# Patient Record
Sex: Male | Born: 1965
Health system: Southern US, Community
[De-identification: ages and names within clinical notes are randomized; demographics above are authoritative.]

## PROBLEM LIST (undated history)

## (undated) DIAGNOSIS — I639 Cerebral infarction, unspecified: Secondary | ICD-10-CM

## (undated) DIAGNOSIS — R7303 Prediabetes: Secondary | ICD-10-CM

## (undated) DIAGNOSIS — G4730 Sleep apnea, unspecified: Secondary | ICD-10-CM

## (undated) DIAGNOSIS — R7611 Nonspecific reaction to tuberculin skin test without active tuberculosis: Secondary | ICD-10-CM

## (undated) DIAGNOSIS — Z972 Presence of dental prosthetic device (complete) (partial): Secondary | ICD-10-CM

## (undated) DIAGNOSIS — I10 Essential (primary) hypertension: Secondary | ICD-10-CM

## (undated) DIAGNOSIS — Z8739 Personal history of other diseases of the musculoskeletal system and connective tissue: Secondary | ICD-10-CM

## (undated) DIAGNOSIS — I6529 Occlusion and stenosis of unspecified carotid artery: Secondary | ICD-10-CM

## (undated) DIAGNOSIS — K219 Gastro-esophageal reflux disease without esophagitis: Secondary | ICD-10-CM

## (undated) DIAGNOSIS — I471 Supraventricular tachycardia, unspecified: Secondary | ICD-10-CM

## (undated) DIAGNOSIS — M549 Dorsalgia, unspecified: Secondary | ICD-10-CM

## (undated) DIAGNOSIS — E785 Hyperlipidemia, unspecified: Secondary | ICD-10-CM

## (undated) DIAGNOSIS — I700 Atherosclerosis of aorta: Secondary | ICD-10-CM

## (undated) DIAGNOSIS — G8929 Other chronic pain: Secondary | ICD-10-CM

## (undated) DIAGNOSIS — M1611 Unilateral primary osteoarthritis, right hip: Secondary | ICD-10-CM

## (undated) HISTORY — PX: NECK SURGERY: SHX720

## (undated) HISTORY — DX: Supraventricular tachycardia, unspecified: I47.10

## (undated) HISTORY — DX: Atherosclerosis of aorta: I70.0

## (undated) HISTORY — PX: MULTIPLE TOOTH EXTRACTIONS: SHX2053

## (undated) HISTORY — PX: BACK SURGERY: SHX140

## (undated) HISTORY — DX: Nonspecific reaction to tuberculin skin test without active tuberculosis: R76.11

## (undated) HISTORY — DX: Personal history of other diseases of the musculoskeletal system and connective tissue: Z87.39

## (undated) HISTORY — DX: Other chronic pain: G89.29

## (undated) HISTORY — DX: Unilateral primary osteoarthritis, right hip: M16.11

## (undated) HISTORY — DX: Dorsalgia, unspecified: M54.9

---

## 1999-12-05 ENCOUNTER — Emergency Department (HOSPITAL_COMMUNITY): Admission: EM | Admit: 1999-12-05 | Discharge: 1999-12-06 | Payer: Self-pay | Admitting: Emergency Medicine

## 2004-06-30 ENCOUNTER — Emergency Department (HOSPITAL_COMMUNITY): Admission: EM | Admit: 2004-06-30 | Discharge: 2004-06-30 | Payer: Self-pay | Admitting: Emergency Medicine

## 2004-09-21 ENCOUNTER — Encounter: Admission: RE | Admit: 2004-09-21 | Discharge: 2004-09-21 | Payer: Self-pay | Admitting: Family Medicine

## 2004-10-06 ENCOUNTER — Ambulatory Visit (HOSPITAL_COMMUNITY): Admission: RE | Admit: 2004-10-06 | Discharge: 2004-10-07 | Payer: Self-pay | Admitting: Neurosurgery

## 2005-01-02 ENCOUNTER — Ambulatory Visit: Payer: Self-pay | Admitting: Family Medicine

## 2005-02-10 ENCOUNTER — Ambulatory Visit: Payer: Self-pay | Admitting: Internal Medicine

## 2005-02-10 ENCOUNTER — Observation Stay (HOSPITAL_COMMUNITY): Admission: EM | Admit: 2005-02-10 | Discharge: 2005-02-11 | Payer: Self-pay | Admitting: *Deleted

## 2005-02-11 ENCOUNTER — Ambulatory Visit: Payer: Self-pay | Admitting: Internal Medicine

## 2005-06-09 ENCOUNTER — Emergency Department (HOSPITAL_COMMUNITY): Admission: EM | Admit: 2005-06-09 | Discharge: 2005-06-09 | Payer: Self-pay | Admitting: Emergency Medicine

## 2005-07-13 ENCOUNTER — Ambulatory Visit: Payer: Self-pay | Admitting: Family Medicine

## 2005-11-14 ENCOUNTER — Ambulatory Visit: Payer: Self-pay | Admitting: Family Medicine

## 2006-03-21 ENCOUNTER — Ambulatory Visit: Payer: Self-pay | Admitting: Internal Medicine

## 2006-03-26 ENCOUNTER — Emergency Department (HOSPITAL_COMMUNITY): Admission: EM | Admit: 2006-03-26 | Discharge: 2006-03-27 | Payer: Self-pay | Admitting: Emergency Medicine

## 2006-03-29 ENCOUNTER — Ambulatory Visit: Payer: Self-pay | Admitting: Family Medicine

## 2006-04-19 ENCOUNTER — Ambulatory Visit: Payer: Self-pay | Admitting: Family Medicine

## 2006-06-19 ENCOUNTER — Emergency Department (HOSPITAL_COMMUNITY): Admission: EM | Admit: 2006-06-19 | Discharge: 2006-06-19 | Payer: Self-pay | Admitting: Family Medicine

## 2006-10-31 ENCOUNTER — Ambulatory Visit: Payer: Self-pay | Admitting: Family Medicine

## 2006-12-11 ENCOUNTER — Emergency Department (HOSPITAL_COMMUNITY): Admission: EM | Admit: 2006-12-11 | Discharge: 2006-12-12 | Payer: Self-pay | Admitting: Emergency Medicine

## 2007-05-31 DIAGNOSIS — M542 Cervicalgia: Secondary | ICD-10-CM | POA: Insufficient documentation

## 2007-06-27 DIAGNOSIS — K219 Gastro-esophageal reflux disease without esophagitis: Secondary | ICD-10-CM | POA: Insufficient documentation

## 2007-06-27 DIAGNOSIS — I10 Essential (primary) hypertension: Secondary | ICD-10-CM | POA: Insufficient documentation

## 2007-07-20 ENCOUNTER — Emergency Department (HOSPITAL_COMMUNITY): Admission: EM | Admit: 2007-07-20 | Discharge: 2007-07-20 | Payer: Self-pay | Admitting: Emergency Medicine

## 2007-07-31 ENCOUNTER — Ambulatory Visit: Payer: Self-pay | Admitting: Family Medicine

## 2007-08-22 ENCOUNTER — Ambulatory Visit: Payer: Self-pay | Admitting: Family Medicine

## 2007-08-22 LAB — CONVERTED CEMR LAB
ALT: 38 units/L (ref 0–53)
AST: 21 units/L (ref 0–37)
Albumin: 3.9 g/dL (ref 3.5–5.2)
Alkaline Phosphatase: 78 units/L (ref 39–117)
BUN: 20 mg/dL (ref 6–23)
Basophils Absolute: 0 10*3/uL (ref 0.0–0.1)
Basophils Relative: 0.1 % (ref 0.0–1.0)
Bilirubin, Direct: 0.1 mg/dL (ref 0.0–0.3)
CO2: 30 meq/L (ref 19–32)
Calcium: 10.2 mg/dL (ref 8.4–10.5)
Chloride: 99 meq/L (ref 96–112)
Cholesterol: 257 mg/dL (ref 0–200)
Creatinine, Ser: 1.2 mg/dL (ref 0.4–1.5)
Direct LDL: 144.3 mg/dL
Eosinophils Absolute: 0.1 10*3/uL (ref 0.0–0.6)
Eosinophils Relative: 0.9 % (ref 0.0–5.0)
GFR calc Af Amer: 86 mL/min
GFR calc non Af Amer: 71 mL/min
Glucose, Bld: 94 mg/dL (ref 70–99)
HCT: 51.1 % (ref 39.0–52.0)
HDL: 39 mg/dL (ref >39.0)
Hemoglobin: 17.6 g/dL — ABNORMAL HIGH (ref 13.0–17.0)
Lymphocytes Relative: 34.5 % (ref 12.0–46.0)
MCHC: 34.5 g/dL (ref 30.0–36.0)
MCV: 90.8 fL (ref 78.0–100.0)
Monocytes Absolute: 1.2 10*3/uL — ABNORMAL HIGH (ref 0.2–0.7)
Monocytes Relative: 8.3 % (ref 3.0–11.0)
Neutro Abs: 8.3 10*3/uL — ABNORMAL HIGH (ref 1.4–7.7)
Neutrophils Relative %: 56.2 % (ref 43.0–77.0)
Platelets: 237 10*3/uL (ref 150–400)
Potassium: 3.9 meq/L (ref 3.5–5.1)
RBC: 5.62 M/uL (ref 4.22–5.81)
RDW: 12.8 % (ref 11.5–14.6)
Sodium: 138 meq/L (ref 135–145)
TSH: 1.03 microintl units/mL (ref 0.35–5.50)
Total Bilirubin: 1.1 mg/dL (ref 0.3–1.2)
Total CHOL/HDL Ratio: 6.6
Total Protein: 7.4 g/dL (ref 6.0–8.3)
Triglycerides: 503 mg/dL (ref 0–149)
VLDL: 101 mg/dL — ABNORMAL HIGH (ref 0–40)
WBC: 14.7 10*3/uL — ABNORMAL HIGH (ref 4.5–10.5)

## 2007-08-29 ENCOUNTER — Ambulatory Visit: Payer: Self-pay | Admitting: Family Medicine

## 2007-08-29 DIAGNOSIS — E785 Hyperlipidemia, unspecified: Secondary | ICD-10-CM | POA: Insufficient documentation

## 2008-01-08 ENCOUNTER — Emergency Department (HOSPITAL_COMMUNITY): Admission: EM | Admit: 2008-01-08 | Discharge: 2008-01-08 | Payer: Self-pay | Admitting: Emergency Medicine

## 2008-02-08 ENCOUNTER — Emergency Department (HOSPITAL_COMMUNITY): Admission: EM | Admit: 2008-02-08 | Discharge: 2008-02-08 | Payer: Self-pay | Admitting: Emergency Medicine

## 2008-05-01 ENCOUNTER — Ambulatory Visit: Payer: Self-pay | Admitting: Family Medicine

## 2008-05-16 ENCOUNTER — Emergency Department (HOSPITAL_COMMUNITY): Admission: EM | Admit: 2008-05-16 | Discharge: 2008-05-16 | Payer: Self-pay | Admitting: Emergency Medicine

## 2008-05-18 ENCOUNTER — Telehealth: Payer: Self-pay | Admitting: Family Medicine

## 2008-07-15 ENCOUNTER — Ambulatory Visit: Payer: Self-pay | Admitting: Internal Medicine

## 2008-08-06 ENCOUNTER — Telehealth: Payer: Self-pay | Admitting: Internal Medicine

## 2008-09-07 ENCOUNTER — Emergency Department (HOSPITAL_COMMUNITY): Admission: EM | Admit: 2008-09-07 | Discharge: 2008-09-07 | Payer: Self-pay | Admitting: Emergency Medicine

## 2008-10-09 ENCOUNTER — Ambulatory Visit (HOSPITAL_COMMUNITY): Admission: RE | Admit: 2008-10-09 | Discharge: 2008-10-10 | Payer: Self-pay | Admitting: Neurosurgery

## 2009-01-04 ENCOUNTER — Ambulatory Visit: Payer: Self-pay | Admitting: Family Medicine

## 2009-01-04 LAB — CONVERTED CEMR LAB
ALT: 51 units/L (ref 0–53)
AST: 36 units/L (ref 0–37)
Albumin: 3.9 g/dL (ref 3.5–5.2)
Alkaline Phosphatase: 81 units/L (ref 39–117)
BUN: 14 mg/dL (ref 6–23)
Basophils Absolute: 0.1 10*3/uL (ref 0.0–0.1)
Basophils Relative: 0.8 % (ref 0.0–3.0)
Bilirubin Urine: NEGATIVE
Bilirubin, Direct: 0.1 mg/dL (ref 0.0–0.3)
Blood in Urine, dipstick: NEGATIVE
CO2: 32 meq/L (ref 19–32)
Calcium: 9.8 mg/dL (ref 8.4–10.5)
Chloride: 104 meq/L (ref 96–112)
Cholesterol: 228 mg/dL (ref 0–200)
Creatinine, Ser: 1.2 mg/dL (ref 0.4–1.5)
Direct LDL: 135.6 mg/dL
Eosinophils Absolute: 0.2 10*3/uL (ref 0.0–0.7)
Eosinophils Relative: 2.5 % (ref 0.0–5.0)
GFR calc Af Amer: 85 mL/min
GFR calc non Af Amer: 71 mL/min
Glucose, Bld: 102 mg/dL — ABNORMAL HIGH (ref 70–99)
Glucose, Urine, Semiquant: NEGATIVE
HCT: 47.4 % (ref 39.0–52.0)
HDL: 36.9 mg/dL — ABNORMAL LOW (ref >39.0)
Hemoglobin: 16.7 g/dL (ref 13.0–17.0)
Ketones, urine, test strip: NEGATIVE
Lymphocytes Relative: 37.7 % (ref 12.0–46.0)
MCHC: 35.2 g/dL (ref 30.0–36.0)
MCV: 90.9 fL (ref 78.0–100.0)
Monocytes Absolute: 0.5 10*3/uL (ref 0.1–1.0)
Monocytes Relative: 7.2 % (ref 3.0–12.0)
Neutro Abs: 3.5 10*3/uL (ref 1.4–7.7)
Neutrophils Relative %: 51.8 % (ref 43.0–77.0)
Nitrite: NEGATIVE
PSA: 1.32 ng/mL (ref 0.10–4.00)
Platelets: 228 10*3/uL (ref 150–400)
Potassium: 3.7 meq/L (ref 3.5–5.1)
Protein, U semiquant: NEGATIVE
RBC: 5.21 M/uL (ref 4.22–5.81)
RDW: 11.9 % (ref 11.5–14.6)
Sodium: 142 meq/L (ref 135–145)
Specific Gravity, Urine: 1.015
TSH: 0.49 microintl units/mL (ref 0.35–5.50)
Total Bilirubin: 0.9 mg/dL (ref 0.3–1.2)
Total CHOL/HDL Ratio: 6.2
Total Protein: 7.4 g/dL (ref 6.0–8.3)
Triglycerides: 374 mg/dL (ref 0–149)
Urobilinogen, UA: 0.2
VLDL: 75 mg/dL — ABNORMAL HIGH (ref 0–40)
WBC Urine, dipstick: NEGATIVE
WBC: 6.9 10*3/uL (ref 4.5–10.5)
pH: 6.5

## 2009-01-11 ENCOUNTER — Ambulatory Visit: Payer: Self-pay | Admitting: Family Medicine

## 2009-01-11 DIAGNOSIS — F17200 Nicotine dependence, unspecified, uncomplicated: Secondary | ICD-10-CM | POA: Insufficient documentation

## 2009-01-11 DIAGNOSIS — F3289 Other specified depressive episodes: Secondary | ICD-10-CM | POA: Insufficient documentation

## 2009-02-09 ENCOUNTER — Ambulatory Visit: Payer: Self-pay | Admitting: Family Medicine

## 2009-05-21 ENCOUNTER — Emergency Department (HOSPITAL_BASED_OUTPATIENT_CLINIC_OR_DEPARTMENT_OTHER): Admission: EM | Admit: 2009-05-21 | Discharge: 2009-05-22 | Payer: Self-pay

## 2009-07-21 ENCOUNTER — Telehealth: Payer: Self-pay | Admitting: Family Medicine

## 2009-08-18 ENCOUNTER — Ambulatory Visit: Payer: Self-pay | Admitting: Family Medicine

## 2009-08-20 ENCOUNTER — Telehealth (INDEPENDENT_AMBULATORY_CARE_PROVIDER_SITE_OTHER): Payer: Self-pay | Admitting: *Deleted

## 2009-08-25 ENCOUNTER — Encounter: Payer: Self-pay | Admitting: Family Medicine

## 2009-11-14 ENCOUNTER — Emergency Department (HOSPITAL_BASED_OUTPATIENT_CLINIC_OR_DEPARTMENT_OTHER): Admission: EM | Admit: 2009-11-14 | Discharge: 2009-11-15 | Payer: Self-pay | Admitting: Emergency Medicine

## 2010-01-18 ENCOUNTER — Emergency Department (HOSPITAL_COMMUNITY): Admission: EM | Admit: 2010-01-18 | Discharge: 2010-01-18 | Payer: Self-pay | Admitting: Emergency Medicine

## 2010-03-02 ENCOUNTER — Emergency Department (HOSPITAL_COMMUNITY): Admission: EM | Admit: 2010-03-02 | Discharge: 2010-03-02 | Payer: Self-pay | Admitting: Emergency Medicine

## 2010-03-11 ENCOUNTER — Ambulatory Visit: Payer: Self-pay | Admitting: Family Medicine

## 2010-03-11 LAB — CONVERTED CEMR LAB
ALT: 37 units/L (ref 0–53)
AST: 26 units/L (ref 0–37)
Albumin: 3.7 g/dL (ref 3.5–5.2)
Alkaline Phosphatase: 81 units/L (ref 39–117)
BUN: 11 mg/dL (ref 6–23)
Basophils Absolute: 0.1 10*3/uL (ref 0.0–0.1)
Basophils Relative: 0.8 % (ref 0.0–3.0)
Bilirubin, Direct: 0 mg/dL (ref 0.0–0.3)
CO2: 28 meq/L (ref 19–32)
Calcium: 9.1 mg/dL (ref 8.4–10.5)
Chloride: 106 meq/L (ref 96–112)
Cholesterol: 221 mg/dL — ABNORMAL HIGH (ref 0–200)
Creatinine, Ser: 1.4 mg/dL (ref 0.4–1.5)
Direct LDL: 100.7 mg/dL
Eosinophils Absolute: 0.4 10*3/uL (ref 0.0–0.7)
Eosinophils Relative: 4.3 % (ref 0.0–5.0)
GFR calc non Af Amer: 70.75 mL/min (ref >60)
Glucose, Bld: 84 mg/dL (ref 70–99)
HCT: 42.4 % (ref 39.0–52.0)
HDL: 38.3 mg/dL — ABNORMAL LOW (ref >39.00)
Hemoglobin: 14.5 g/dL (ref 13.0–17.0)
Lymphocytes Relative: 39.7 % (ref 12.0–46.0)
Lymphs Abs: 3.5 10*3/uL (ref 0.7–4.0)
MCHC: 34.2 g/dL (ref 30.0–36.0)
MCV: 91.9 fL (ref 78.0–100.0)
Monocytes Absolute: 0.7 10*3/uL (ref 0.1–1.0)
Monocytes Relative: 8.2 % (ref 3.0–12.0)
Neutro Abs: 4 10*3/uL (ref 1.4–7.7)
Neutrophils Relative %: 47 % (ref 43.0–77.0)
Platelets: 188 10*3/uL (ref 150.0–400.0)
Potassium: 4.1 meq/L (ref 3.5–5.1)
RBC: 4.61 M/uL (ref 4.22–5.81)
RDW: 12.2 % (ref 11.5–14.6)
Sodium: 143 meq/L (ref 135–145)
TSH: 2.93 microintl units/mL (ref 0.35–5.50)
Total Bilirubin: 0.2 mg/dL — ABNORMAL LOW (ref 0.3–1.2)
Total CHOL/HDL Ratio: 6
Total Protein: 6.7 g/dL (ref 6.0–8.3)
Triglycerides: 586 mg/dL — ABNORMAL HIGH (ref 0.0–149.0)
VLDL: 117.2 mg/dL — ABNORMAL HIGH (ref 0.0–40.0)
WBC: 8.7 10*3/uL (ref 4.5–10.5)

## 2010-03-17 ENCOUNTER — Ambulatory Visit: Payer: Self-pay | Admitting: Family Medicine

## 2010-04-29 ENCOUNTER — Ambulatory Visit: Payer: Self-pay | Admitting: Diagnostic Radiology

## 2010-04-29 ENCOUNTER — Emergency Department (HOSPITAL_BASED_OUTPATIENT_CLINIC_OR_DEPARTMENT_OTHER): Admission: EM | Admit: 2010-04-29 | Discharge: 2010-04-29 | Payer: Self-pay | Admitting: Emergency Medicine

## 2010-06-19 ENCOUNTER — Emergency Department (HOSPITAL_BASED_OUTPATIENT_CLINIC_OR_DEPARTMENT_OTHER): Admission: EM | Admit: 2010-06-19 | Discharge: 2010-06-19 | Payer: Self-pay | Admitting: Emergency Medicine

## 2010-08-29 ENCOUNTER — Emergency Department (HOSPITAL_BASED_OUTPATIENT_CLINIC_OR_DEPARTMENT_OTHER): Admission: EM | Admit: 2010-08-29 | Discharge: 2010-08-29 | Payer: Self-pay | Admitting: Emergency Medicine

## 2010-11-15 ENCOUNTER — Ambulatory Visit: Payer: Self-pay | Admitting: Family Medicine

## 2010-11-15 DIAGNOSIS — M109 Gout, unspecified: Secondary | ICD-10-CM | POA: Insufficient documentation

## 2010-11-15 LAB — CONVERTED CEMR LAB
BUN: 14 mg/dL (ref 6–23)
Basophils Absolute: 0 10*3/uL (ref 0.0–0.1)
Basophils Relative: 0.4 % (ref 0.0–3.0)
CO2: 28 meq/L (ref 19–32)
Calcium: 10.1 mg/dL (ref 8.4–10.5)
Chloride: 101 meq/L (ref 96–112)
Creatinine, Ser: 1.1 mg/dL (ref 0.4–1.5)
Eosinophils Absolute: 0.1 10*3/uL (ref 0.0–0.7)
Eosinophils Relative: 1.6 % (ref 0.0–5.0)
GFR calc non Af Amer: 98.3 mL/min (ref >60)
Glucose, Bld: 104 mg/dL — ABNORMAL HIGH (ref 70–99)
HCT: 43.7 % (ref 39.0–52.0)
Hemoglobin: 15 g/dL (ref 13.0–17.0)
Lymphocytes Relative: 41.3 % (ref 12.0–46.0)
Lymphs Abs: 3.5 10*3/uL (ref 0.7–4.0)
MCHC: 34.2 g/dL (ref 30.0–36.0)
MCV: 93.3 fL (ref 78.0–100.0)
Monocytes Absolute: 0.5 10*3/uL (ref 0.1–1.0)
Monocytes Relative: 6.5 % (ref 3.0–12.0)
Neutro Abs: 4.2 10*3/uL (ref 1.4–7.7)
Neutrophils Relative %: 50.2 % (ref 43.0–77.0)
Platelets: 273 10*3/uL (ref 150.0–400.0)
Potassium: 4.4 meq/L (ref 3.5–5.1)
RBC: 4.69 M/uL (ref 4.22–5.81)
RDW: 12.6 % (ref 11.5–14.6)
Sodium: 138 meq/L (ref 135–145)
Uric Acid, Serum: 6.1 mg/dL (ref 4.0–7.8)
WBC: 8.4 10*3/uL (ref 4.5–10.5)

## 2010-11-22 ENCOUNTER — Ambulatory Visit: Payer: Self-pay | Admitting: Family Medicine

## 2010-11-26 ENCOUNTER — Emergency Department (HOSPITAL_BASED_OUTPATIENT_CLINIC_OR_DEPARTMENT_OTHER)
Admission: EM | Admit: 2010-11-26 | Discharge: 2010-11-26 | Payer: Self-pay | Source: Home / Self Care | Admitting: Emergency Medicine

## 2011-01-09 ENCOUNTER — Encounter: Payer: Self-pay | Admitting: Emergency Medicine

## 2011-01-15 LAB — CONVERTED CEMR LAB: PSA: 1.83 ng/mL (ref 0.10–4.00)

## 2011-01-17 NOTE — Progress Notes (Signed)
Summary: Tb test  Phone Note Call from Patient   Summary of Call: pt is calling because he needs a TB skin test.  However in the past he has had  a possitive result.  should the patient come in for a TB skin test or have a chest xray done?  He would like to become a foster parent and needs a form filled out.  He has had a CPX this year.  [phone] phone number   Initial call taken by: Kern Reap CMA,  July 21, 2009 11:07 AM  Follow-up for Phone Call        if he had a positive TB skin test at one time he should never have a TB skin test ever again.  Therefore, he will need a screening PA and lateral chest x-ray Follow-up by: Roderick Pee MD,  July 21, 2009 11:39 AM  Additional Follow-up for Phone Call Additional follow up Details #1::        x-ray ordered. patient is aware Additional Follow-up by: Kern Reap CMA,  July 21, 2009 12:13 PM

## 2011-01-17 NOTE — Letter (Signed)
Summary: Out of Work  Adult nurse at [employer]  [address]   [place], [place] [address]   Phone: [phone]  Fax: [phone]    July 15, 2008   Employee:  [employer]    To Whom It May Concern:   For Medical reasons, please excuse the above named employee from work for the following dates:  Start:   7-29  End:   8-3  If you need additional information, please feel free to contact our office.         Sincerely,    Gordy Savers  MD

## 2011-01-17 NOTE — Progress Notes (Signed)
Summary: patient question  Phone Note Call from Patient Call back at [phone]   Caller: patient live Call For: Tawanna Cooler Summary of Call: Patient wants to change his neuro surgen doctor does he need to see you again for neck injury.please call. Initial call taken by: Celine Ahr,  May 18, 2008 2:46 PM

## 2011-01-17 NOTE — Assessment & Plan Note (Signed)
Summary: back pain/njr   Vital Signs:  Patient Profile:   45 Years Old Male Weight:      199 pounds Temp:     98.3 degrees F oral Pulse rate:   72 / minute Pulse rhythm:   regular BP sitting:   124 / 88  (left arm) Cuff size:   large  Vitals Entered By: Alfred Levins, CMA (May 01, 2008 3:28 PM)                 Chief Complaint:  neck pain.  History of Present Illness: 3 days ago developed pain and spasm in the right side of his neck. Using Motrin and ice. No trauma hx.     Current Allergies (reviewed today): No known allergies   Past Medical History:    Reviewed history from 08/29/2007 and no changes required:       GERD       Hypertension       Hyperlipidemia     Review of Systems      See HPI   Physical Exam  General:     Well-developed,well-nourished,in no acute distress; alert,appropriate and cooperative throughout examination Msk:     tender in right posterior neck, very limited ROM    Impression & Recommendations:  Problem # 1:  NECK PAIN, ACUTE (ICD-723.1)  The following medications were removed from the medication list:    Vicodin Es 7.5-750 Mg Tabs (Hydrocodone-acetaminophen) .Marland Kitchen... Prn  His updated medication list for this problem includes:    Flexeril 10 Mg Tabs (Cyclobenzaprine hcl) .Marland Kitchen... Three times a day as needed spasm    Diclofenac Sodium 50 Mg Tbec (Diclofenac sodium) .Marland Kitchen... Three times a day as needed pain    Vicodin 5-500 Mg Tabs (Hydrocodone-acetaminophen) .Marland Kitchen... 1 every 4 hours as needed pain   Complete Medication List: 1)  Tenoretic 50 50-25 Mg Tabs (Atenolol-chlorthalidone) .Marland Kitchen.. 1 by mouth once daily 2)  Catapres 0.2 Mg Tabs (Clonidine hcl) .... Take 1 tablet by mouth every morning 3)  Zocor 20 Mg Tabs (Simvastatin) .Marland Kitchen.. 1 tab @ bedtime 4)  Flexeril 10 Mg Tabs (Cyclobenzaprine hcl) .... Three times a day as needed spasm 5)  Diclofenac Sodium 50 Mg Tbec (Diclofenac sodium) .... Three times a day as needed pain 6)  Vicodin 5-500  Mg Tabs (Hydrocodone-acetaminophen) .Marland Kitchen.. 1 every 4 hours as needed pain   Patient Instructions: 1)  Please schedule a follow-up appointment as needed.   Prescriptions: VICODIN 5-500 MG  TABS (HYDROCODONE-ACETAMINOPHEN) 1 every 4 hours as needed pain  #60 x 0   Entered and Authorized by:   Nelwyn Salisbury MD   Signed by:   Nelwyn Salisbury MD on 05/01/2008   Method used:   Print then Give to Patient   RxID:   [id number] DICLOFENAC SODIUM 50 MG  TBEC (DICLOFENAC SODIUM) three times a day as needed pain  #60 x 2   Entered and Authorized by:   Nelwyn Salisbury MD   Signed by:   Nelwyn Salisbury MD on 05/01/2008   Method used:   Print then Give to Patient   RxID:   [id number] FLEXERIL 10 MG  TABS (CYCLOBENZAPRINE HCL) three times a day as needed spasm  #60 x 2   Entered and Authorized by:   Nelwyn Salisbury MD   Signed by:   Nelwyn Salisbury MD on 05/01/2008   Method used:   Print then Give to Patient   RxID:   [id number]  ]

## 2011-01-17 NOTE — Assessment & Plan Note (Signed)
Summary: back pain/mhf   Vital Signs:  Patient Profile:   45 Years Old Male Weight:      200 pounds Temp:     98.4 degrees F oral BP sitting:   130 / 96  (left arm) Cuff size:   regular  Vitals Entered By: Raechel Ache, RN (July 15, 2008 10:44 AM)                 Chief Complaint:  C/o neck pain radiating to R shoulder. Treated for this 2 months ago- came back..  History of Present Illness: 45 year old gentleman with a history of lumbar disk disease.  He was treated approximately 4 months ago for cervical pain.  He responded to, anti-inflammatories, and analgesics, and for the past couple days has had recurrent posterior neck pain.  Pain is referred to the right shoulder area.  It is aggravated by movement, especially side-to-side tilting.  Denies any motor symptoms    Current Allergies: No known allergies       Physical Exam  General:     Well-developed,well-nourished,in no acute distress; alert,appropriate and cooperative throughout examination; uncomfortable; appropriate affect  Neck:     supple and decreased ROM.  range-of-motion did tend to aggravate pain.  Muscles in the posterior neck were slightly tight and tense Neurologic:     alert & oriented X3, cranial nerves II-XII intact, and strength normal in all extremities.  biceps and triceps reflexes, equal    Impression & Recommendations:  Problem # 1:  NECK PAIN, ACUTE (ICD-723.1)  The following medications were removed from the medication list:    Flexeril 10 Mg Tabs (Cyclobenzaprine hcl) .Marland Kitchen... Three times a day as needed spasm    Diclofenac Sodium 50 Mg Tbec (Diclofenac sodium) .Marland Kitchen... Three times a day as needed pain    Vicodin 5-500 Mg Tabs (Hydrocodone-acetaminophen) .Marland Kitchen... 1 every 4 hours as needed pain  His updated medication list for this problem includes:    Hydrocodone-acetaminophen 7.5-750 Mg Tabs (Hydrocodone-acetaminophen) ..... One every 6 hours as needed for pain   Complete Medication List:  1)  Tenoretic 50 50-25 Mg Tabs (Atenolol-chlorthalidone) .Marland Kitchen.. 1 by mouth once daily 2)  Catapres 0.2 Mg Tabs (Clonidine hcl) .... Take 1 tablet by mouth every morning 3)  Zocor 20 Mg Tabs (Simvastatin) .Marland Kitchen.. 1 tab @ bedtime 4)  Prednisone (pak) 10 Mg Tabs (Prednisone) .... Take as directed 5)  Hydrocodone-acetaminophen 7.5-750 Mg Tabs (Hydrocodone-acetaminophen) .... One every 6 hours as needed for pain   Patient Instructions: 1)  warm compresses to the neck area as needed 2)  take medications as prescribed 3)  call if unimproved   Prescriptions: HYDROCODONE-ACETAMINOPHEN 7.5-750 MG  TABS (HYDROCODONE-ACETAMINOPHEN) one every 6 hours as needed for pain  #50 x 0   Entered and Authorized by:   Gordy Savers  MD   Signed by:   Gordy Savers  MD on 07/15/2008   Method used:   Print then Give to Patient   RxID:   [id number] PREDNISONE (PAK) 10 MG  TABS (PREDNISONE) take as directed  #10 mg 12 day x 0   Entered and Authorized by:   Gordy Savers  MD   Signed by:   Gordy Savers  MD on 07/15/2008   Method used:   Print then Give to Patient   RxID:   [id number]  ]

## 2011-01-17 NOTE — Assessment & Plan Note (Signed)
Summary: cpx/jls   Vital Signs:  Patient Profile:   45 Years Old Male Height:     71.5 inches Weight:      209 pounds Temp:     89.7 degrees F oral BP sitting:   150 / 108  (left arm) Cuff size:   regular  Vitals Entered By: Kern Reap CMA (January 11, 2009 2:30 PM)                 Chief Complaint:  cpx.  History of Present Illness: Mike Shepard is a 45 year old male, who comes in today for evaluation of hypertension, hyperlipidemia, tobacco abuse, fatigue, sleep dysfunction.  His hypertension has been treated with Tenoretic 50 -- 25 daily, along with Cat, .2 daily.  BP in the mornings, running 150/90.  He states he is compliant with his medication.  He takes Zocor 20 mg nightly for hyperlipidemia.  LDL is 135.  Will increase Zocor to 40 mg nightly.  The tobacco abuse is a half a pack of cigarettes a day.  He was advised he must quit.  Will start him on a smoking cessation program with medication.  Today.  The fatigue and sleep dysfunction. are related stress because he's been out of work because he had neck surgery by Dr. Venetia Maxon. , but he still has pain in his back. he  Is requesting pain medication from me.  I explained that any pain related to surgery.  Dr. Venetia Maxon would need to be notified immediately because Dr. Venetia Maxon will.  What to see him.    Updated Prior Medication List: TENORETIC 50 50-25 MG TABS (ATENOLOL-CHLORTHALIDONE) 1 by mouth once daily CATAPRES 0.2 MG  TABS (CLONIDINE HCL) Take 1 tablet by mouth every morning ZOCOR 20 MG  TABS (SIMVASTATIN) 1 tab @ bedtime FLEXERIL 10 MG  TABS (CYCLOBENZAPRINE HCL) one tab every 8 hours as needed for muscle spasm  Current Allergies: No known allergies   Past Medical History:    Reviewed history from 08/29/2007 and no changes required:       GERD       Hypertension       Hyperlipidemia       tobacco abuse  Past Surgical History:    Reviewed history and no changes required:       Cervical laminectomy   Family History:     Reviewed history from 06/27/2007 and no changes required:       Family History Diabetes 1st degree relative       Family History Hypertension       Fam hx CHF  Social History:    Reviewed history from 08/29/2007 and no changes required:       Married       Alcohol use-no       r       Current Smoker   Risk Factors:     Counseled to quit/cut down tobacco use:  yes Passive smoke exposure:  yes    Physical Exam  General:     Well-developed,well-nourished,in no acute distress; alert,appropriate and cooperative throughout examination Head:     Normocephalic and atraumatic without obvious abnormalities. No apparent alopecia or balding. Eyes:     No corneal or conjunctival inflammation noted. EOMI. Perrla. Funduscopic exam benign, without hemorrhages, exudates or papilledema. Vision grossly normal. Ears:     External ear exam shows no significant lesions or deformities.  Otoscopic examination reveals clear canals, tympanic membranes are intact bilaterally without bulging, retraction, inflammation or discharge. Hearing is grossly  normal bilaterally. Nose:     External nasal examination shows no deformity or inflammation. Nasal mucosa are pink and moist without lesions or exudates. Mouth:     Oral mucosa and oropharynx without lesions or exudates.  Teeth in good repair. Neck:     No deformities, masses, or tenderness noted. Chest Wall:     No deformities, masses, tenderness or gynecomastia noted. Breasts:     No masses or gynecomastia noted Lungs:     Normal respiratory effort, chest expands symmetrically. Lungs are clear to auscultation, no crackles or wheezes. Heart:     Normal rate and regular rhythm. S1 and S2 normal without gallop, murmur, click, rub or other extra sounds. Abdomen:     Bowel sounds positive,abdomen soft and non-tender without masses, organomegaly or hernias noted. Rectal:     No external abnormalities noted. Normal sphincter tone. No rectal masses or  tenderness. Genitalia:     Testes bilaterally descended without nodularity, tenderness or masses. No scrotal masses or lesions. No penis lesions or urethral discharge. Prostate:     Prostate gland firm and smooth, no enlargement, nodularity, tenderness, mass, asymmetry or induration. Msk:     No deformity or scoliosis noted of thoracic or lumbar spine.   Pulses:     R and L carotid,radial,femoral,dorsalis pedis and posterior tibial pulses are full and equal bilaterally Extremities:     No clubbing, cyanosis, edema, or deformity noted with normal full range of motion of all joints.   Neurologic:     No cranial nerve deficits noted. Station and gait are normal. Plantar reflexes are down-going bilaterally. DTRs are symmetrical throughout. Sensory, motor and coordinative functions appear intact. Skin:     Intact without suspicious lesions or rashes Cervical Nodes:     No lymphadenopathy noted Axillary Nodes:     No palpable lymphadenopathy Inguinal Nodes:     No significant adenopathy Psych:     Cognition and judgment appear intact. Alert and cooperative with normal attention span and concentration. No apparent delusions, illusions, hallucinations    Impression & Recommendations:  Problem # 1:  HYPERLIPIDEMIA (ICD-272.4) Assessment: Improved  The following medications were removed from the medication list:    Zocor 20 Mg Tabs (Simvastatin) .Marland Kitchen... 1 tab @ bedtime  His updated medication list for this problem includes:    Zocor 40 Mg Tabs (Simvastatin) .Marland Kitchen... 1 tab @ bedtime  Orders: Tobacco use cessation intensive >10 minutes (07371)   Problem # 2:  HYPERTENSION (ICD-401.9) Assessment: Deteriorated  His updated medication list for this problem includes:    Tenoretic 50 50-25 Mg Tabs (Atenolol-chlorthalidone) .Marland Kitchen... 1 by mouth once daily    Catapres 0.2 Mg Tabs (Clonidine hcl) .Marland Kitchen... Take 1 tablet by mouth every morning  Orders: Tobacco use cessation intensive >10 minutes  (06269)   Problem # 3:  ATYPICAL DEPRESSIVE DISORDER (ICD-296.82) Assessment: New  Orders: Tobacco use cessation intensive >10 minutes (48546)   Problem # 4:  TOBACCO USER (ICD-305.1) Assessment: Deteriorated  Orders: Tobacco use cessation intensive >10 minutes (27035)  His updated medication list for this problem includes:    Chantix Starting Month Pak 0.5 Mg X 11 & 1 Mg X 42 Tabs (Varenicline tartrate) ..... Uad   Complete Medication List: 1)  Tenoretic 50 50-25 Mg Tabs (Atenolol-chlorthalidone) .Marland Kitchen.. 1 by mouth once daily 2)  Catapres 0.2 Mg Tabs (Clonidine hcl) .... Take 1 tablet by mouth every morning 3)  Flexeril 10 Mg Tabs (Cyclobenzaprine hcl) .... One tab every 8 hours as  needed for muscle spasm 4)  Zocor 40 Mg Tabs (Simvastatin) .Marland Kitchen.. 1 tab @ bedtime 5)  Chantix Starting Month Pak 0.5 Mg X 11 & 1 Mg X 42 Tabs (Varenicline tartrate) .... Uad 6)  Darvocet-n 100 100-650 Mg Tabs (Propoxyphene n-apap) .... 2 by mouth at bedtime as needed 7)  Celexa 20 Mg Tabs (Citalopram hydrobromide) .Marland Kitchen.. 1 tab @ bedtime   Patient Instructions: 1)  Tobacco is very bad for your health and your loved ones! You Should stop smoking!. 2)  Stop Smoking Tips: Choose a Quit date. Cut down before the Quit date. decide what you will do as a substitute when you feel the urge to smoke(gum,toothpick,exercise). 3)  start the smoking cessation medication as outlined.  Return in 4 weeks for follow-up. 4)  Also begin Celexa 20 mg a day at bedtime. 5)  He may also take one or two.  Darvocet-N tablets at bedtime until you see Dr. Venetia Maxon.  In the future.  Dr. Venetia Maxon will write all your pain medication   Prescriptions: CELEXA 20 MG TABS (CITALOPRAM HYDROBROMIDE) 1 tab @ bedtime  #30 x 0   Entered and Authorized by:   Roderick Pee MD   Signed by:   Roderick Pee MD on 01/11/2009   Method used:   Print then Give to Patient   RxID:   [id number] CHANTIX STARTING MONTH PAK 0.5 MG X 11 & 1 MG X 42 TABS  (VARENICLINE TARTRATE) UAD  #1 x 0   Entered and Authorized by:   Roderick Pee MD   Signed by:   Roderick Pee MD on 01/11/2009   Method used:   Print then Give to Patient   RxID:   [id number] ZOCOR 40 MG TABS (SIMVASTATIN) 1 tab @ bedtime  #100 x 3   Entered and Authorized by:   Roderick Pee MD   Signed by:   Roderick Pee MD on 01/11/2009   Method used:   Print then Give to Patient   RxID:   [id number] FLEXERIL 10 MG  TABS (CYCLOBENZAPRINE HCL) one tab every 8 hours as needed for muscle spasm  #30 x 0   Entered and Authorized by:   Roderick Pee MD   Signed by:   Roderick Pee MD on 01/11/2009   Method used:   Print then Give to Patient   RxID:   [id number] CATAPRES 0.2 MG  TABS (CLONIDINE HCL) Take 1 tablet by mouth every morning  #100 x 3   Entered and Authorized by:   Roderick Pee MD   Signed by:   Roderick Pee MD on 01/11/2009   Method used:   Print then Give to Patient   RxID:   [id number] TENORETIC 50 50-25 MG TABS (ATENOLOL-CHLORTHALIDONE) 1 by mouth once daily  #100 Tablet x 3   Entered and Authorized by:   Roderick Pee MD   Signed by:   Roderick Pee MD on 01/11/2009   Method used:   Print then Give to Patient   RxID:   [id number] DARVOCET-N 100 100-650 MG TABS (PROPOXYPHENE N-APAP) 2 by mouth at bedtime as needed  #30 x 0   Entered and Authorized by:   Roderick Pee MD   Signed by:   Roderick Pee MD on 01/11/2009   Method used:   Print then Give to Patient   RxID:   [id number] FLEXERIL 10 MG  TABS (CYCLOBENZAPRINE HCL) one tab every 8 hours  as needed for muscle spasm  #30 x 0   Entered and Authorized by:   Roderick Pee MD   Signed by:   Roderick Pee MD on 01/11/2009   Method used:   Electronically to        [facility] (retail)       [address]       [place], [place]  [address]       Ph: [phone]       Fax: [phone]   RxID:   [id number] CATAPRES 0.2 MG   TABS (CLONIDINE HCL) Take 1 tablet by mouth every morning  #100 x 3   Entered and Authorized by:   Roderick Pee MD   Signed by:   Roderick Pee MD on 01/11/2009   Method used:   Electronically to        [facility] (retail)       [address]       [place], [place]  [address]       Ph: [phone]       Fax: [phone]   RxID:   [id number] TENORETIC 50 50-25 MG TABS (ATENOLOL-CHLORTHALIDONE) 1 by mouth once daily  #100 Tablet x 3   Entered and Authorized by:   Roderick Pee MD   Signed by:   Roderick Pee MD on 01/11/2009   Method used:   Electronically to        [facility] (retail)       [address]       [place], [place]  [address]       Ph: [phone]       Fax: [phone]   RxID:   [id number] CHANTIX STARTING MONTH PAK 0.5 MG X 11 & 1 MG X 42 TABS (VARENICLINE TARTRATE) UAD  #1 x 0   Entered and Authorized by:   Roderick Pee MD   Signed by:   Roderick Pee MD on 01/11/2009   Method used:   Electronically to        [facility] (retail)       [address]       [place], [place]  [address]       Ph: [phone]       Fax: [phone]   RxID:   [id number] ZOCOR 40 MG TABS (SIMVASTATIN) 1 tab @ bedtime  #100 x 3   Entered and Authorized by:   Roderick Pee MD   Signed by:   Roderick Pee MD on 01/11/2009   Method used:   Electronically to        [facility] (retail)       [address]       [place], [place]  [address]       Ph: [phone]       Fax: [phone]   RxID:   [id number]

## 2011-01-17 NOTE — Progress Notes (Signed)
Summary: Wants results of xray  Phone Note Call from Patient   Summary of Call: Patient requesting results from xray. Patient can be reached at [phone]. Initial call taken by: Darra Lis RMA,  August 20, 2009 9:59 AM  Follow-up for Phone Call        please call chest x-ray okay Follow-up by: Roderick Pee MD,  August 20, 2009 10:27 AM  Additional Follow-up for Phone Call Additional follow up Details #1::        Patient informed. Additional Follow-up by: Darra Lis RMA,  August 20, 2009 10:34 AM

## 2011-01-17 NOTE — Assessment & Plan Note (Signed)
Summary: cpx//ccm   Vital Signs:  Patient profile:   45 year old male Height:      71.5 inches Weight:      209 pounds BMI:     28.85 Pulse rate:   70 / minute Pulse rhythm:   regular BP sitting:   120 / 84  (left arm) Cuff size:   regular  Vitals Entered By: Kern Reap CMA Duncan Dull) (March 17, 2010 2:53 PM) CC: cpx   CC:  cpx.  History of Present Illness: Mike Shepard is a 45 year old male, nonsmoker........Marland Kitchenx 8 mo.off chantix x 4 mo,....., who comes in today for evaluation of hypertension, hyperlipidemia, mild depression.   his blood pressure is treated with Catapres, .2, daily, Tenoretic 50 -- 25 daily, and Zestril 20 mg daily.  BP 120/84.  His hyperlipidemia is two Zocor 40 mg nightly however, he spin not taking his medication daily, and this is represented by a marked increase in his lipids with a triglyceride level of 586.  Total cholesterol 221, however, HDL is 38, LDL is 100.  Therefore, is mostly a triglyceride issue.  His depression history with Celexa 20 mg nightly sleeping well and feels well.  He spin off tobacco products for 8 months and off.  The chantix for 4 months.  He states he feels much better off nicotine.  History T9.  Care general care.  Both his grandfathers had prostate cancer.  He would like to begin prostate cancer screening.  Tetanus posterior 2005    Preventive Screening-Counseling & Management  Alcohol-Tobacco     Smoking Status: quit  Caffeine-Diet-Exercise     Does Patient Exercise: yes      Drug Use:  no.    Allergies (verified): No Known Drug Allergies  Past History:  Past medical, surgical, family and social histories (including risk factors) reviewed, and no changes noted (except as noted below).  Past Medical History: Reviewed history from 01/11/2009 and no changes required. GERD Hypertension Hyperlipidemia tobacco abuse  Past Surgical History: Reviewed history from 01/11/2009 and no changes required. Cervical  laminectomy  Family History: Reviewed history from 06/27/2007 and no changes required. Family History Diabetes 1st degree relative Family History Hypertension Fam hx CHF  Social History: Reviewed history from 08/29/2007 and no changes required. Married Alcohol use-no r Former Smoker Drug use-no Regular exercise-yes Smoking Status:  quit Drug Use:  no Does Patient Exercise:  yes  Review of Systems      See HPI  Physical Exam  General:  Well-developed,well-nourished,in no acute distress; alert,appropriate and cooperative throughout examination Head:  Normocephalic and atraumatic without obvious abnormalities. No apparent alopecia or balding. Eyes:  No corneal or conjunctival inflammation noted. EOMI. Perrla. Funduscopic exam benign, without hemorrhages, exudates or papilledema. Vision grossly normal. Ears:  External ear exam shows no significant lesions or deformities.  Otoscopic examination reveals clear canals, tympanic membranes are intact bilaterally without bulging, retraction, inflammation or discharge. Hearing is grossly normal bilaterally. Nose:  External nasal examination shows no deformity or inflammation. Nasal mucosa are pink and moist without lesions or exudates. Mouth:  Oral mucosa and oropharynx without lesions or exudates.  Teeth in good repair. Neck:  No deformities, masses, or tenderness noted. Chest Wall:  No deformities, masses, tenderness or gynecomastia noted. Breasts:  No masses or gynecomastia noted Lungs:  Normal respiratory effort, chest expands symmetrically. Lungs are clear to auscultation, no crackles or wheezes. Heart:  Normal rate and regular rhythm. S1 and S2 normal without gallop, murmur, click, rub or other extra  sounds. Abdomen:  Bowel sounds positive,abdomen soft and non-tender without masses, organomegaly or hernias noted. Rectal:  No external abnormalities noted. Normal sphincter tone. No rectal masses or tenderness. Genitalia:  Testes  bilaterally descended without nodularity, tenderness or masses. No scrotal masses or lesions. No penis lesions or urethral discharge. Prostate:  Prostate gland firm and smooth, no enlargement, nodularity, tenderness, mass, asymmetry or induration. Msk:  No deformity or scoliosis noted of thoracic or lumbar spine.   Pulses:  R and L carotid,radial,femoral,dorsalis pedis and posterior tibial pulses are full and equal bilaterally Extremities:  No clubbing, cyanosis, edema, or deformity noted with normal full range of motion of all joints.   Neurologic:  No cranial nerve deficits noted. Station and gait are normal. Plantar reflexes are down-going bilaterally. DTRs are symmetrical throughout. Sensory, motor and coordinative functions appear intact. Skin:  Intact without suspicious lesions or rashes Cervical Nodes:  No lymphadenopathy noted Axillary Nodes:  No palpable lymphadenopathy Inguinal Nodes:  No significant adenopathy Psych:  Cognition and judgment appear intact. Alert and cooperative with normal attention span and concentration. No apparent delusions, illusions, hallucinations   Impression & Recommendations:  Problem # 1:  TOBACCO USER (ICD-305.1) Assessment Comment Only  The following medications were removed from the medication list:    Chantix Starting Month Pak 0.5 Mg X 11 & 1 Mg X 42 Tabs (Varenicline tartrate) ..... Uad  Problem # 2:  ATYPICAL DEPRESSIVE DISORDER (ICD-296.82) Assessment: Improved  Orders: Prescription Created Electronically [phone])  Problem # 3:  HYPERLIPIDEMIA (ICD-272.4) Assessment: Deteriorated  His updated medication list for this problem includes:    Zocor 40 Mg Tabs (Simvastatin) .Marland Kitchen... 1 tab @ bedtime  Orders: Prescription Created Electronically [id number]) EKG w/ Interpretation (93000)  Problem # 4:  HYPERTENSION (ICD-401.9) Assessment: Improved  His updated medication list for this problem includes:    Tenoretic 50 50-25 Mg Tabs  (Atenolol-chlorthalidone) .Marland Kitchen... 1 by mouth once daily    Catapres 0.2 Mg Tabs (Clonidine hcl) .Marland Kitchen... Take 1 tablet by mouth every morning    Zestril 20 Mg Tabs (Lisinopril) .Marland Kitchen... Take 1 tablet by mouth every morning  Orders: Prescription Created Electronically [phone]) EKG w/ Interpretation (93000)  Complete Medication List: 1)  Tenoretic 50 50-25 Mg Tabs (Atenolol-chlorthalidone) .Marland Kitchen.. 1 by mouth once daily 2)  Catapres 0.2 Mg Tabs (Clonidine hcl) .... Take 1 tablet by mouth every morning 3)  Zocor 40 Mg Tabs (Simvastatin) .Marland Kitchen.. 1 tab @ bedtime 4)  Celexa 20 Mg Tabs (Citalopram hydrobromide) .Marland Kitchen.. 1 tab @ bedtime 5)  Zestril 20 Mg Tabs (Lisinopril) .... Take 1 tablet by mouth every morning  Other Orders: Venipuncture (91478) TLB-PSA (Prostate Specific Antigen) (84153-PSA)  Patient Instructions: 1)  takes all y  medications at the same time in the morning.  Return in two months for a fasting lipid panel code number 272.0 Prescriptions: ZESTRIL 20 MG TABS (LISINOPRIL) Take 1 tablet by mouth every morning  #100 x 3   Entered and Authorized by:   Roderick Pee MD   Signed by:   Roderick Pee MD on 03/17/2010   Method used:   Electronically to        [facility] (retail)       [address]       [place], [place]  [address]       Ph: [phone]       Fax: [phone]   RxID:   [id number] CELEXA 20 MG  TABS (CITALOPRAM HYDROBROMIDE) 1 tab @ bedtime  #100 x 3   Entered and Authorized by:   Roderick Pee MD   Signed by:   Roderick Pee MD on 03/17/2010   Method used:   Electronically to        [facility] (retail)       [address]       [place], [place]  [address]       Ph: [phone]       Fax: [phone]   RxID:   [id number] ZOCOR 40 MG TABS (SIMVASTATIN) 1 tab @ bedtime  #100 x 3   Entered and Authorized by:   Roderick Pee MD   Signed by:   Roderick Pee MD on 03/17/2010   Method  used:   Electronically to        [facility] (retail)       [address]       [place], [place]  [address]       Ph: [phone]       Fax: [phone]   RxID:   [id number] CATAPRES 0.2 MG  TABS (CLONIDINE HCL) Take 1 tablet by mouth every morning  #100 x 3   Entered and Authorized by:   Roderick Pee MD   Signed by:   Roderick Pee MD on 03/17/2010   Method used:   Electronically to        [facility] (retail)       [address]       [place], [place]  [address]       Ph: [phone]       Fax: [phone]   RxID:   [id number] TENORETIC 50 50-25 MG TABS (ATENOLOL-CHLORTHALIDONE) 1 by mouth once daily  #100 x 3   Entered and Authorized by:   Roderick Pee MD   Signed by:   Roderick Pee MD on 03/17/2010   Method used:   Electronically to        [facility] (retail)       [address]       [place], [place]  [address]       Ph: [phone]       Fax: [phone]   RxID:   [id number]

## 2011-01-17 NOTE — Assessment & Plan Note (Signed)
Summary: 3 WK ROV/NJR/RESCH WITH PATIENT/JLS   Vital Signs:  Patient Profile:   45 Years Old Male Height:     71.5 inches Weight:      212 pounds Temp:     98.2 degrees F oral BP sitting:   154 / 110  (left arm) Cuff size:   regular  Vitals Entered By: Kern Reap CMA (February 09, 2009 10:08 AM)                 Chief Complaint:  follow up bp.  History of Present Illness: Mike Shepard is a 45 year old male, who comes back today for follow-up of two problems.  His underlying hypertension, treated with Tenoretic 50 -- 25 q.a.m. and Catapres .2 q.a.m.  He's been compliant with his blood pressure medication daily.  However, his blood pressure runs 150/110,  He's been checking a daily at home and is getting the same date at home.  He's never been on an ACE inhibitor.  We will try that.  Next.  He continues to smoke a half-pack of cigarettes a day.  I discussed in great detail.  The negative side affects of hypertension, tobacco abuse, hyperlipidemia, including over the next 10 to 15 years.  He has upward to a 75% chance of having a heart attack or stroke.  He was greatly encouraged to start a smoking cessation program.  They would outlined.    Current Allergies (reviewed today): No known allergies   Past Medical History:    Reviewed history from 01/11/2009 and no changes required:       GERD       Hypertension       Hyperlipidemia       tobacco abuse   Social History:    Reviewed history from 08/29/2007 and no changes required:       Married       Alcohol use-no       r       Current Smoker   Risk Factors:     Counseled to quit/cut down tobacco use:  yes   Review of Systems      See HPI   Physical Exam  General:     Well-developed,well-nourished,in no acute distress; alert,appropriate and cooperative throughout examination Heart:     154/110    Impression & Recommendations:  Problem # 1:  TOBACCO USER (ICD-305.1) Assessment: Unchanged  His updated  medication list for this problem includes:    Chantix Starting Month Pak 0.5 Mg X 11 & 1 Mg X 42 Tabs (Varenicline tartrate) ..... Uad  Orders: Tobacco use cessation intermediate 3-10 minutes ([address])   Problem # 2:  HYPERTENSION (ICD-401.9) Assessment: Unchanged  His updated medication list for this problem includes:    Tenoretic 50 50-25 Mg Tabs (Atenolol-chlorthalidone) .Marland Kitchen... 1 by mouth once daily    Catapres 0.2 Mg Tabs (Clonidine hcl) .Marland Kitchen... Take 1 tablet by mouth every morning    Zestril 20 Mg Tabs (Lisinopril) .Marland Kitchen... Take 1 tablet by mouth every morning  Orders: Tobacco use cessation intermediate 3-10 minutes (99406)   Complete Medication List: 1)  Tenoretic 50 50-25 Mg Tabs (Atenolol-chlorthalidone) .Marland Kitchen.. 1 by mouth once daily 2)  Catapres 0.2 Mg Tabs (Clonidine hcl) .... Take 1 tablet by mouth every morning 3)  Zocor 40 Mg Tabs (Simvastatin) .Marland Kitchen.. 1 tab @ bedtime 4)  Chantix Starting Month Pak 0.5 Mg X 11 & 1 Mg X 42 Tabs (Varenicline tartrate) .... Uad 5)  Celexa 20 Mg Tabs (Citalopram hydrobromide) .Marland KitchenMarland KitchenMarland Kitchen  1 tab @ bedtime 6)  Zestril 20 Mg Tabs (Lisinopril) .... Take 1 tablet by mouth every morning   Patient Instructions: 1)  begin the smoking cessation program as outlined.  If you get to the blue tablets and have side effects then cut.  The blue tablets in half and take a half a tablet twice a day or one half tablet once a day. 2)  Add Zestoretic 20 mg one tablet daily in the morning.  Stop the clonidine, but continue the Tenoretic.  Check a morning blood pressure daily.  Return in 6 weeks for follow-up   Prescriptions: ZESTRIL 20 MG TABS (LISINOPRIL) Take 1 tablet by mouth every morning  #100 x 3   Entered and Authorized by:   Roderick Pee MD   Signed by:   Roderick Pee MD on 02/09/2009   Method used:   Electronically to        [facility] (retail)       [address]       [place], [place]  [address]       Ph: [phone]        Fax: [phone]   RxID:   [id number]

## 2011-01-17 NOTE — Letter (Signed)
Summary: Medical Exam for [facility] Exam for [facility] [place] [place] [facility]   Imported By: Maryln Gottron 08/27/2009 10:15:47  _____________________________________________________________________  External Attachment:    Type:   Image     Comment:   External Document

## 2011-01-17 NOTE — Assessment & Plan Note (Signed)
Summary: PAIN IN BIG TOE (GOUT CONCERNS) // RS   Vital Signs:  Patient profile:   45 year old male Weight:      202 pounds Temp:     98.2 degrees F oral BP sitting:   128 / 90  (left arm) Cuff size:   regular  Vitals Entered By: Kern Reap CMA Duncan Dull) (November 15, 2010 12:37 PM) CC: right grand toe pain   CC:  right grand toe pain.  History of Present Illness: Mike Shepard is a 45 year old male, who comes in today for evaluation of pain in his right great toe x 3 days.  Three days ago he noticed a sudden onset of pain in his right great toe.  Pain is so severe he can't stand the sheets to touch.  It.  He's never had a problem with this in the past.  He did drink a low extra alcohol over the weekend because it was the Thanksgiving holiday.  His father had gout  Allergies: No Known Drug Allergies  Past History:  Past medical, surgical, family and social histories (including risk factors) reviewed for relevance to current acute and chronic problems.  Past Medical History: Reviewed history from 01/11/2009 and no changes required. GERD Hypertension Hyperlipidemia tobacco abuse  Past Surgical History: Reviewed history from 01/11/2009 and no changes required. Cervical laminectomy  Family History: Reviewed history from 06/27/2007 and no changes required. Family History Diabetes 1st degree relative Family History Hypertension Fam hx CHF  Social History: Reviewed history from 03/17/2010 and no changes required. Married Alcohol use-no r Former Smoker Drug use-no Regular exercise-yes  Review of Systems      See HPI  Physical Exam  General:  Well-developed,well-nourished,in no acute distress; alert,appropriate and cooperative throughout examination Msk:  right great toe, proximal joint is swollen, red, and hot and extremely tender   Problems:  Medical Problems Added: 1)  Dx of Gout  (ICD-274.9)  Impression & Recommendations:  Problem # 1:  GOUT  (ICD-274.9) Assessment New  Orders: Venipuncture (30160) TLB-BMP (Basic Metabolic Panel-BMET) (80048-METABOL) TLB-CBC Platelet - w/Differential (85025-CBCD) TLB-Uric Acid, Blood (84550-URIC)  Complete Medication List: 1)  Tenoretic 50 50-25 Mg Tabs (Atenolol-chlorthalidone) .Marland Kitchen.. 1 by mouth once daily 2)  Catapres 0.2 Mg Tabs (Clonidine hcl) .... Take 1 tablet by mouth every morning 3)  Zocor 40 Mg Tabs (Simvastatin) .Marland Kitchen.. 1 tab @ bedtime 4)  Celexa 20 Mg Tabs (Citalopram hydrobromide) .Marland Kitchen.. 1 tab @ bedtime 5)  Zestril 20 Mg Tabs (Lisinopril) .... Take 1 tablet by mouth every morning 6)  Prednisone 20 Mg Tabs (Prednisone) .... Uad 7)  Vicodin Es 7.5-750 Mg Tabs (Hydrocodone-acetaminophen) .... 1/2 to 1 by mouth qid as needed pain  Patient Instructions: 1)  begin prednisone, take two tabs x 3 days, one x 3 days, a half x 3 days, then a half a tablet Monday, Wednesday, Friday, for a two week taper. 2)  Elevation and ice 15 minutes 4 times daily. 3)  Vicodin one half to one tablet every 4 to 6 hours as needed for severe pain. 4)  Return next Tuesday for follow-up Prescriptions: VICODIN ES 7.5-750 MG TABS (HYDROCODONE-ACETAMINOPHEN) 1/2 to 1 by mouth qid as needed pain  #30 x 0   Entered and Authorized by:   Roderick Pee MD   Signed by:   Roderick Pee MD on 11/15/2010   Method used:   Print then Give to Patient   RxID:   [id number] PREDNISONE 20 MG TABS (PREDNISONE) UAD  #30  x 0   Entered and Authorized by:   Roderick Pee MD   Signed by:   Roderick Pee MD on 11/15/2010   Method used:   Print then Give to Patient   RxID:   [id number]    Orders Added: 1)  Venipuncture [16010] 2)  TLB-BMP (Basic Metabolic Panel-BMET) [80048-METABOL] 3)  TLB-CBC Platelet - w/Differential [85025-CBCD] 4)  TLB-Uric Acid, Blood [84550-URIC] 5)  Est. Patient Level III [93235]

## 2011-01-17 NOTE — Progress Notes (Signed)
Summary: Rx pain and muscle relaxer ongoing back pain  Phone Note Call from Patient Call back at Home Phone [phone]   Caller: Patient Call For: Todd/Ekam Besson Summary of Call: Pt C/O ongoing back pain, saw Dr Amador Cunas on 7/29 and has scheduled another F/U on 8/27. Pt requesting more Hydrocodone and muscle relaxer med (pt reports he was given samples on 7/29) [facility] Initial call taken by: Sid Falcon LPN,  August 06, 2008 1:48 PM  Follow-up for Phone Call        Generic vicodin 5-500  #50   Generic flexeril 10  #50 one every 8 hrs for spasm Follow-up by: Gordy Savers  MD,  August 06, 2008 2:03 PM        Appended Document: Rx pain and muscle relaxer ongoing back pain Pt informed Rx sent to his pharmacy. Sid Falcon LPN  August 06, 2008 3:58 PM    Clinical Lists Changes  Medications: Added new medication of VICODIN 5-500 MG  TABS (HYDROCODONE-ACETAMINOPHEN) one tab every 6-8 hours as needed for pain - Signed Added new medication of FLEXERIL 10 MG  TABS (CYCLOBENZAPRINE HCL) one tab every 8 hours as needed for muscle spasm - Signed Rx of VICODIN 5-500 MG  TABS (HYDROCODONE-ACETAMINOPHEN) one tab every 6-8 hours as needed for pain;  #50 x 0;  Signed;  Entered by: Sid Falcon LPN;  Authorized by: Gordy Savers  MD;  Method used: Electronic Rx of FLEXERIL 10 MG  TABS (CYCLOBENZAPRINE HCL) one tab every 8 hours as needed for muscle spasm;  #50 x 0;  Signed;  Entered by: Sid Falcon LPN;  Authorized by: Gordy Savers  MD;  Method used: Electronic    Prescriptions: FLEXERIL 10 MG  TABS (CYCLOBENZAPRINE HCL) one tab every 8 hours as needed for muscle spasm  #50 x 0   Entered by:   Sid Falcon LPN   Authorized by:   Gordy Savers  MD   Signed by:   Sid Falcon LPN on 09/81/1914   Method used:   Electronically sent to ...       [facility]       [address]       [place], [place]  [address]       Ph: [phone]       Fax: [phone]   RxID:   [id number] VICODIN 5-500 MG  TABS (HYDROCODONE-ACETAMINOPHEN) one tab every 6-8 hours as needed for pain  #50 x 0   Entered by:   Sid Falcon LPN   Authorized by:   Gordy Savers  MD   Signed by:   Sid Falcon LPN on 40/34/7425   Method used:   Electronically sent to ...       [facility]       [address]       [place], [place]  [address]       Ph: [phone]       Fax: [phone]   RxID:   [id number]

## 2011-01-17 NOTE — Assessment & Plan Note (Signed)
Summary: pain/ccmsc per pt/njr   Vital Signs:  Patient Profile:   45 Years Old Male Weight:      280 pounds (127.27 kg) Temp:     98.2 degrees F (36.78 degrees C) BP sitting:   150 / 100  (right arm)  Pt. in pain?   yes    Location:   neck    Intensity:   8    Type:       sharp/tingling/pulling  Vitals Entered By: [name], RN (July 31, 2007 10:55 AM)                Chief Complaint:  limited ROM in neck", painruns down neck to r arm, and can't sleep".  History of Present Illness:   He says he was well until about two months ago, when he began experiencing right neck pain.  Initially, would come and go.  About 3 weeks ago, became constant.  Since that time, the pain is increased in severity.  Also, about 3 w and it is worse since the eighth.  Interrupts his sleep.  He's been  unable to work this week because of the pain. he's not had any history of trauma to his neck.  He did have a ruptured lumbar disk in 2005.  That had to be repaired surgically.  His past medical history, review of systems otherwise negative.  His only medication is his antihypertensive medicine, which   Acute Visit History:      He denies abdominal pain, chest pain, constipation, cough, diarrhea, earache, eye symptoms, fever, genitourinary symptoms, headache, musculoskeletal symptoms, nasal discharge, nausea, rash, sinus problems, sore throat, and vomiting.         Current Allergies: No known allergies   Past Medical History:    Reviewed history from 06/27/2007 and no changes required:       GERD       Hypertension   Social History:    Reviewed history from 06/27/2007 and no changes required:       Married       Alcohol use-no       Former Smoker    Review of Systems      See HPI   Physical Exam  General:     Well-developed,well-nourished,in no acute distress; alert,appropriate and cooperative throughout examination Head:     Normocephalic and atraumatic without obvious  abnormalities. No apparent alopecia or balding. Eyes:     No corneal or conjunctival inflammation noted. EOMI. Perrla. Funduscopic exam benign, without hemorrhages, exudates or papilledema. Vision grossly normal. Ears:     External ear exam shows no significant lesions or deformities.  Otoscopic examination reveals clear canals, tympanic membranes are intact bilaterally without bulging, retraction, inflammation or discharge. Hearing is grossly normal bilaterally. Nose:     External nasal examination shows no deformity or inflammation. Nasal mucosa are pink and moist without lesions or exudates. Mouth:     Oral mucosa and oropharynx without lesions or exudates.  Teeth in good repair. Neck:     No deformities, masses, or tenderness noted. Msk:     No deformity or scoliosis noted of thoracic or lumbar spine.   Neurologic:     neurologic examination upper extremities is normal.  In particular, his sensation muscle strength were normal.  Also, his reflexes were normal.    Impression & Recommendations:  Problem # 1:  NECK PAIN, ACUTE (ICD-723.1) Assessment: New  Complete Medication List: 1)  Tenoretic 50 50-25 Mg Tabs (Atenolol-chlorthalidone) .Marland KitchenMarland KitchenMarland Kitchen 1  qd 2)  Clonidine Hcl 0.1 Mg Tabs (Clonidine hcl) .Marland Kitchen.. 1 once daily 3)  Prilosec Otc  4)  Mvi  5)  Motrin  6)  Alieve    Patient Instructions: 1)  I would get a soft cervical collar.  I would wear it 24 hours a day 7 days a week.  I would begin Flexeril 10 mg 3 times a day along with Vicodin ES 3 times a day see you back this Friday for reevaluation.

## 2011-01-17 NOTE — Assessment & Plan Note (Signed)
Summary: DOUBLE BOOKED/ PER DR TO COME BACK ON THIS FRIDAY//[facility]   Vital Signs:  Patient Profile:   45 Years Old Male Weight:      200 pounds (90.91 kg) Temp:     98.1 degrees F (36.72 degrees C) oral BP sitting:   140 / 100  (right arm)  Pt. in pain?   yes    Location:   neck    Intensity:   8    Type:       sharp/stinging  Vitals Entered By: [name], RN (August 29, 2007 2:58 PM)                  Chief Complaint:  R ARM STINGING PAIN FROM NECK DOWN ARM and LIMITIED ROM IN NECK ACCOMPANIED WITH PAIN.  History of Present Illness: Mike Shepard is a 45 year old male comes back today for follow-up of 3 problems.  Problem number one we saw him a month ago for neck pain.  We put him on a short course of prednisone, Vicodin and Flexeril.  He did not improve.  His pain is still sharp.  It still on a scale of one to 10 and 8.  It radiates down to his right arm and he has some numbness in teeny in that arm.  Also, his blood pressure is not at goal is on Tenoretic 5025 one a day and clonidine .1.  A day.  Also, his lipids are high.  His cholesterol size.  Triglycerides were 400.  Positive family history of hyperlipidemia.  That runs in the family also he said previous.  There are surgical procedures by Dr. Venetia Maxon.  In a lumbar diskectomy for severe ruptured disk in 2005.  Acute Visit History:      He denies abdominal pain, chest pain, constipation, cough, diarrhea, earache, eye symptoms, fever, genitourinary symptoms, headache, musculoskeletal symptoms, nasal discharge, nausea, rash, sinus problems, sore throat, and vomiting.        "WORSE  TODAY THAT BEFORE"  Current Allergies (reviewed today): No known allergies   Past Medical History:    GERD    Hypertension    Hyperlipidemia   Family History:    Reviewed history from 06/27/2007 and no changes required:       Family History Diabetes 1st degree relative       Family History Hypertension       Fam hx CHF  Social  History:    Reviewed history from 06/27/2007 and no changes required:       Married       Alcohol use-no       r       Current Smoker   Risk Factors:  Tobacco use:  current    Counseled to quit/cut down tobacco use:  yes   Review of Systems      See HPI   Physical Exam  General:     Well-developed,well-nourished,in no acute distress; alert,appropriate and cooperative throughout examination Eyes:     No corneal or conjunctival inflammation noted. EOMI. Perrla. Funduscopic exam benign, without hemorrhages, exudates or papilledema. Vision grossly normal. Ears:     External ear exam shows no significant lesions or deformities.  Otoscopic examination reveals clear canals, tympanic membranes are intact bilaterally without bulging, retraction, inflammation or discharge. Hearing is grossly normal bilaterally. Nose:     External nasal examination shows no deformity or inflammation. Nasal mucosa are pink and moist without lesions or exudates. Mouth:     Oral mucosa and oropharynx  without lesions or exudates.  Teeth in good repair. Msk:     No deformity or scoliosis noted of thoracic or lumbar spine.   Pulses:     R and L carotid,radial,femoral,dorsalis pedis and posterior tibial pulses are full and equal bilaterally Extremities:     No clubbing, cyanosis, edema, or deformity noted with normal full range of motion of all joints.   Neurologic:     No cranial nerve deficits noted. Station and gait are normal. Plantar reflexes are down-going bilaterally. DTRs are symmetrical throughout. Sensory, motor and coordinative functions appear intact.    Impression & Recommendations:  Problem # 1:  NECK PAIN, ACUTE (ICD-723.1) Assessment: Deteriorated  His updated medication list for this problem includes:    Vicodin Es 7.5-750 Mg Tabs (Hydrocodone-acetaminophen) .Marland Kitchen... Prn  Orders: Neurosurgeon Referral (Neurosurgeon)   Problem # 2:  HYPERLIPIDEMIA (ICD-272.4) Assessment: Deteriorated   His updated medication list for this problem includes:    Zocor 20 Mg Tabs (Simvastatin) .Marland Kitchen... 1 tab @ bedtime   Problem # 3:  HYPERTENSION (ICD-401.9) Assessment: Deteriorated  The following medications were removed from the medication list:    Clonidine Hcl 0.1 Mg Tabs (Clonidine hcl) .Marland Kitchen... 1 once daily  His updated medication list for this problem includes:    Tenoretic 50 50-25 Mg Tabs (Atenolol-chlorthalidone) .Marland Kitchen... 1 qd    Catapres 0.2 Mg Tabs (Clonidine hcl) .Marland Kitchen... Take 1 tablet by mouth every morning   Complete Medication List: 1)  Tenoretic 50 50-25 Mg Tabs (Atenolol-chlorthalidone) .Marland Kitchen.. 1 qd 2)  Prednisone 20 Mg Tabs (Prednisone) .... Uad 3)  Vicodin Es 7.5-750 Mg Tabs (Hydrocodone-acetaminophen) .... Prn 4)  Catapres 0.2 Mg Tabs (Clonidine hcl) .... Take 1 tablet by mouth every morning 5)  Zocor 20 Mg Tabs (Simvastatin) .Marland Kitchen.. 1 tab @ bedtime   Patient Instructions: 1)  I will write you a prescription for Vicodin ES and 10 mg Flexeril tabs 50 of each and one refill to tidied over until we can get an appointment to see the neurosurgeon.  Please were the cervical collar.  All the time.  We walled cyst begin to increase the clonidine to .1.  A day to .2.  Will also add Zocor 20 mg nightly for the hyperlipidemia.  Please schedule a follow-up appointment in 4 weeks to recheck your blood pressure and he get a follow-up lipid panel.  We will contact the neurosurgeon and get an appointment as soon as possible in the meantime, if he has severe pain, or any other severe symptoms.  Please emergency room for immediate evaluation    Prescriptions: ZOCOR 20 MG  TABS (SIMVASTATIN) 1 tab @ bedtime  #100 x 4   Entered and Authorized by:   Roderick Pee MD   Signed by:   Roderick Pee MD on 08/29/2007   Method used:   Print then Give to Patient   RxID:   [id number] CATAPRES 0.2 MG  TABS (CLONIDINE HCL) Take 1 tablet by mouth every morning  #100 x 4   Entered and Authorized by:    Roderick Pee MD   Signed by:   Roderick Pee MD on 08/29/2007   Method used:   Print then Give to Patient   RxID:   [id number]  ]

## 2011-01-27 ENCOUNTER — Ambulatory Visit (INDEPENDENT_AMBULATORY_CARE_PROVIDER_SITE_OTHER): Payer: 59 | Admitting: Family Medicine

## 2011-01-27 ENCOUNTER — Encounter: Payer: Self-pay | Admitting: Family Medicine

## 2011-01-27 DIAGNOSIS — I10 Essential (primary) hypertension: Secondary | ICD-10-CM

## 2011-01-27 DIAGNOSIS — M542 Cervicalgia: Secondary | ICD-10-CM

## 2011-01-27 DIAGNOSIS — E785 Hyperlipidemia, unspecified: Secondary | ICD-10-CM

## 2011-01-27 LAB — CBC WITH DIFFERENTIAL/PLATELET
Basophils Absolute: 0.1 10*3/uL (ref 0.0–0.1)
Basophils Relative: 0.7 % (ref 0.0–3.0)
Eosinophils Absolute: 0.2 10*3/uL (ref 0.0–0.7)
Eosinophils Relative: 2.7 % (ref 0.0–5.0)
HCT: 44.8 % (ref 39.0–52.0)
Hemoglobin: 15.7 g/dL (ref 13.0–17.0)
Lymphocytes Relative: 48.1 % — ABNORMAL HIGH (ref 12.0–46.0)
Lymphs Abs: 3.8 10*3/uL (ref 0.7–4.0)
MCHC: 35.1 g/dL (ref 30.0–36.0)
MCV: 91.3 fl (ref 78.0–100.0)
Monocytes Absolute: 0.6 10*3/uL (ref 0.1–1.0)
Monocytes Relative: 7.7 % (ref 3.0–12.0)
Neutro Abs: 3.2 10*3/uL (ref 1.4–7.7)
Neutrophils Relative %: 40.8 % — ABNORMAL LOW (ref 43.0–77.0)
Platelets: 229 10*3/uL (ref 150.0–400.0)
RBC: 4.91 Mil/uL (ref 4.22–5.81)
RDW: 12.9 % (ref 11.5–14.6)
WBC: 7.9 10*3/uL (ref 4.5–10.5)

## 2011-01-27 LAB — HEPATIC FUNCTION PANEL
ALT: 33 U/L (ref 0–53)
AST: 22 U/L (ref 0–37)
Albumin: 4.2 g/dL (ref 3.5–5.2)
Alkaline Phosphatase: 98 U/L (ref 39–117)
Bilirubin, Direct: 0.1 mg/dL (ref 0.0–0.3)
Total Bilirubin: 0.9 mg/dL (ref 0.3–1.2)
Total Protein: 7.6 g/dL (ref 6.0–8.3)

## 2011-01-27 LAB — POCT URINALYSIS DIPSTICK
Bilirubin, UA: NEGATIVE
Blood, UA: NEGATIVE
Glucose, UA: NEGATIVE
Ketones, UA: NEGATIVE
Leukocytes, UA: NEGATIVE
Nitrite, UA: NEGATIVE
Protein, UA: NEGATIVE
Spec Grav, UA: 1.01
Urobilinogen, UA: 0.2
pH, UA: 5.5

## 2011-01-27 LAB — BASIC METABOLIC PANEL
BUN: 14 mg/dL (ref 6–23)
CO2: 29 mEq/L (ref 19–32)
Calcium: 10.1 mg/dL (ref 8.4–10.5)
Chloride: 99 mEq/L (ref 96–112)
Creatinine, Ser: 1.3 mg/dL (ref 0.4–1.5)
GFR: 78.14 mL/min (ref >60.00)
Glucose, Bld: 107 mg/dL — ABNORMAL HIGH (ref 70–99)
Potassium: 4.2 mEq/L (ref 3.5–5.1)
Sodium: 137 mEq/L (ref 135–145)

## 2011-01-27 LAB — LIPID PANEL
Cholesterol: 196 mg/dL (ref 0–200)
HDL: 39.2 mg/dL (ref >39.00)
Total CHOL/HDL Ratio: 5
Triglycerides: 337 mg/dL — ABNORMAL HIGH (ref 0.0–149.0)
VLDL: 67.4 mg/dL — ABNORMAL HIGH (ref 0.0–40.0)

## 2011-01-27 LAB — TSH: TSH: 1.27 u[IU]/mL (ref 0.35–5.50)

## 2011-01-27 MED ORDER — HYDROCODONE-ACETAMINOPHEN 7.5-750 MG PO TABS: 1.0000 | ORAL_TABLET | Freq: Four times a day (QID) | ORAL | Status: DC | PRN

## 2011-01-27 MED ORDER — ATENOLOL-CHLORTHALIDONE 50-25 MG PO TABS: 1.0000 | ORAL_TABLET | Freq: Every day | ORAL | Status: DC

## 2011-01-27 MED ORDER — LISINOPRIL 40 MG PO TABS: 40.0000 mg | ORAL_TABLET | Freq: Every day | ORAL | Status: DC

## 2011-01-27 MED ORDER — CYCLOBENZAPRINE HCL 10 MG PO TABS: ORAL_TABLET | ORAL | Status: DC

## 2011-01-27 NOTE — Patient Instructions (Addendum)
Take the Flexeril and/or pain pill nightly at bedtime.  We have a consult request for u to  go to the physicians at the pain clinic.  If you do not hear from [place] by Monday at noon time.  Call the office and asked for terri   continue the Tenoretic, one tablet daily, but increased her lisinopril to 40 mg daily.  Check your blood pressure daily in the morning.  Return in 3 weeks for follow-up with the data and the device

## 2011-01-27 NOTE — Progress Notes (Signed)
  Subjective:    Patient ID: Mike Shepard, male    DOB: 1966/04/14, 45 y.o.   MRN: [id number]  HPI Mike Shepard Is a 45 year old male, who comes in today for evaluation of two problems.  He has a history of underlying hypertension, for which he takes Tenoretic 50 -- 25 daily and lisinopril 20 mg daily.  BP today 160 over hundred right arm sitting position, however, he says his blood pressure at home.  This morning was 120/70.  I suspect he has a malfunctioning cuff.  He, states she's been compliant with his medication.  He had a history of lumbar and cervical disk surgery.  Recently saw his neurosurgeon, Dr. Venetia Maxon, who felt there was nothing surgically they had to offer him and referred him to [place] for chronic pain therapy.  I explained a Maylon, that I did not treat chronic pain, and we will get him set up to see the anesthesiologist at the pain clinic with a   Review of Systems Negative   Objective:   Physical Exam    Well-developed well-nourished, male, no acute distress.  BP right arm sitting position 160 over hundred    Assessment & Plan:  Chronic pain.  Plan refer to the pain clinic.  Hypertension.  Continue Tenoretic 50 -- 25, but increase lisinopril to 40 mg daily BP check daily in the morning.  Follow-up in 3 weeks

## 2011-01-30 LAB — LDL CHOLESTEROL, DIRECT: Direct LDL: 113 mg/dL

## 2011-02-09 ENCOUNTER — Telehealth: Payer: Self-pay | Admitting: *Deleted

## 2011-02-09 NOTE — Telephone Encounter (Signed)
Letter ready for pick up and Left message on machine

## 2011-02-09 NOTE — Telephone Encounter (Signed)
ok 

## 2011-02-09 NOTE — Telephone Encounter (Signed)
Pt was taking a pre-employment physical and his BP was high and he needs a letter from Dr Tawanna Cooler stating that he is treating his BP and adjusting meds

## 2011-02-23 ENCOUNTER — Telehealth: Payer: Self-pay | Admitting: *Deleted

## 2011-02-23 DIAGNOSIS — M542 Cervicalgia: Secondary | ICD-10-CM

## 2011-02-23 NOTE — Telephone Encounter (Signed)
patient  Is requesting a refill of hydroco/apap 7.5/750.  [facility]

## 2011-02-25 NOTE — Telephone Encounter (Signed)
Question of what does he need pain medicine for

## 2011-03-03 NOTE — Telephone Encounter (Signed)
patient  Has an appointment with the pain clinic but it is not for another week.  He would like  A refill until then.  Is this okay to fill?

## 2011-03-06 MED ORDER — HYDROCODONE-ACETAMINOPHEN 7.5-750 MG PO TABS: 1.0000 | ORAL_TABLET | Freq: Four times a day (QID) | ORAL | Status: DC | PRN

## 2011-03-06 NOTE — Telephone Encounter (Signed)
rx called in.  patient  Is aware.

## 2011-03-06 NOTE — Telephone Encounter (Signed)
Addended by: Kern Reap on: 03/06/2011 05:54 PM   Modules accepted: Orders

## 2011-03-06 NOTE — Telephone Encounter (Signed)
Okay to refill medications for one week

## 2011-03-10 ENCOUNTER — Encounter: Payer: 59 | Attending: Physical Medicine & Rehabilitation

## 2011-03-10 ENCOUNTER — Ambulatory Visit (HOSPITAL_BASED_OUTPATIENT_CLINIC_OR_DEPARTMENT_OTHER): Payer: 59 | Admitting: Physical Medicine & Rehabilitation

## 2011-03-10 DIAGNOSIS — M545 Low back pain, unspecified: Secondary | ICD-10-CM | POA: Insufficient documentation

## 2011-03-10 DIAGNOSIS — M62838 Other muscle spasm: Secondary | ICD-10-CM | POA: Insufficient documentation

## 2011-03-10 DIAGNOSIS — Z79899 Other long term (current) drug therapy: Secondary | ICD-10-CM | POA: Insufficient documentation

## 2011-03-10 DIAGNOSIS — M961 Postlaminectomy syndrome, not elsewhere classified: Secondary | ICD-10-CM

## 2011-03-10 DIAGNOSIS — M542 Cervicalgia: Secondary | ICD-10-CM | POA: Insufficient documentation

## 2011-03-10 DIAGNOSIS — R209 Unspecified disturbances of skin sensation: Secondary | ICD-10-CM | POA: Insufficient documentation

## 2011-03-10 DIAGNOSIS — R5381 Other malaise: Secondary | ICD-10-CM | POA: Insufficient documentation

## 2011-03-16 ENCOUNTER — Other Ambulatory Visit: Payer: Self-pay | Admitting: Physical Medicine & Rehabilitation

## 2011-03-16 DIAGNOSIS — M549 Dorsalgia, unspecified: Secondary | ICD-10-CM

## 2011-03-21 ENCOUNTER — Ambulatory Visit
Admission: RE | Admit: 2011-03-21 | Discharge: 2011-03-21 | Disposition: A | Discharge status: Home / Self Care | Payer: 59 | Source: Ambulatory Visit | Attending: Physical Medicine & Rehabilitation | Admitting: Physical Medicine & Rehabilitation

## 2011-03-21 DIAGNOSIS — M549 Dorsalgia, unspecified: Secondary | ICD-10-CM

## 2011-03-21 MED ORDER — GADOBENATE DIMEGLUMINE 529 MG/ML IV SOLN
20.0000 mL | Freq: Once | INTRAVENOUS | Status: AC | PRN
  Administered 2011-03-21: 20 mL via INTRAVENOUS

## 2011-03-27 ENCOUNTER — Ambulatory Visit: Payer: 59 | Admitting: Family Medicine

## 2011-03-29 NOTE — Consult Note (Signed)
REASON FOR VISIT:  Neck and back pain.  Consult request by Dr. Tawanna Cooler in [place] at [place].  CHIEF COMPLAINT:  Neck pain and back pain.  HISTORY:  A 45 year old male who has a long history of neck pain as well as low back pain.  He had a lumbar laminectomy and diskectomy per Dr. Phoebe Perch on October 06, 2004 at L2-3 level.  He had ACDF at C5-6 level in 2009 per Dr. Venetia Maxon.  He has had imaging study, CT of the neck on Apr 29, 2010, which showed no signs of any postoperative problems in the cervical spine area.  His average pain is 8/10, currently 10.  Pain is described as sharp, burning, stabbing, constant tingling, constant aching.  Sleep is poor. His pain is worse with walking, bending, sitting, standing.  Relief from meds is fair and pain does improve with medication, has had some good relief in the past with Percocet, but not as much with hydrocodone, has also had some relief of his spasms with diazepam, but not with Flexeril.  He is not employed.  He did return to work after his neck surgery in 2009, but then is officially not working since September 06, 2010.  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  He was a Curator.  REVIEW OF SYSTEMS:  Positive for weakness, numbness, trouble walking, spasms, spasms mainly in his neck.  His functional ability is independent except he needs help with getting up off the toilet from time to time, just a boost as he tells me, also has some pain with laundry, mowing yard, dishes.  SOCIAL HISTORY:  Married, lives with his wife, drinks about four beers a week.  He had a DUI in 2002, non since that time.  Denies any illegal drug use.  His personal goal is to return to the workforce and some type of sedentary job.  He like to go back to school to learn a new trade.  FAMILY HISTORY:  Diabetes, hypertension.  MEDICATIONS: 1. Lisinopril 20 mg a day. 2. Simvastatin 40 mg a day. 3. Atenolol and chlorthalidone 50/25 one p.o.  daily.  ALLERGIES:  None known.  He states he still has some hydrocodone 7.5 that he takes four times a day, but did not bring a bottle in.  PHYSICAL EXAMINATION:  VITAL SIGNS:  His blood pressure is 133/77, pulse 62, respirations 18, O2 sat 100% on room air. GENERAL:  This is a well-developed, well-nourished male in no acute stress.  Orientation x3.  Affect is bright, alert.  Gait is normal. NECK:  His neck range of motion is 75% forward flexion and extension, but only 50% left lateral bending and left lateral rotation. EXTREMITIES:  His upper extremity strength range of motion and sensation are all normal.  His lower extremity strength range of motion and deep tendon reflexes are all normal.  His lumbar spine range of motion is 25% range forward flexion, extension, lateral station and bending.  His gait is forward flexed, he appears to have some tight hamstrings as well. Straight leg raising test is negative.  IMPRESSION: 1. Cervical pain.  Cervical postlaminectomy syndrome.  No evidence of     radiculopathy, does have some chronic muscle spasm, some myofascial     pain likely superimposed.  We will trial him on Skelaxin. 2. Lumbar postlaminectomy syndrome.  No radicular discomfort, but does     have increasing low back pain.  He has pain at the postoperative     site, but  also inferior.  He may have new disk or recurrent disk at     L2-3.  We will check MRI given chronicity in gradual worsening.     Discussed with the patient and agrees with plan.  I will see him     back in 2-3 weeks to go over the MRI results.  We will look at     urine drug screen, consider treatment options including oxycodone.     I did indicate that I would not be prescribing Valium as a muscle     relaxer, we look for other alternatives, nonbenzodiazepine.  We also discussed multimodal approach likely requiring some physical therapy as well as injections.     Erick Colace,  M.D.    AEK/MedQ D:03/10/2011 12:34:48  T:03/10/2011 23:15:46  Job #:  [id number]  cc:   Tinnie Gens A. Tawanna Cooler, MD [address] [place] [address]  Danae Orleans. Venetia Maxon, M.D. Fax: [phone]  Electronically Signed by Claudette Laws M.D. on 03/29/2011 10:19:27 AM REASON FOR VISIT:  Neck and back pain.  Consult request by Dr. Tawanna Cooler in [place] at [place].  CHIEF COMPLAINT:  Neck pain and back pain.  HISTORY:  A 45 year old male who has a long history of neck pain as well as low back pain.  He had a lumbar laminectomy and diskectomy per Dr. Phoebe Perch on October 06, 2004 at L2-3 level.  He had ACDF at C5-6 level in 2009 per Dr. Venetia Maxon.  He has had imaging study, CT of the neck on Apr 29, 2010, which showed no signs of any postoperative problems in the cervical spine area.  His average pain is 8/10, currently 10.  Pain is described as sharp, burning, stabbing, constant tingling, constant aching.  Sleep is poor. His pain is worse with walking, bending, sitting, standing.  Relief from meds is fair and pain does improve with medication, has had some good relief in the past with Percocet, but not as much with hydrocodone, has also had some relief of his spasms with diazepam, but not with Flexeril.  He is not employed.  He did return to work after his neck surgery in 2009, but then is officially not working since September 06, 2010.  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  He was a Curator.  REVIEW OF SYSTEMS:  Positive for weakness, numbness, trouble walking, spasms, spasms mainly in his neck.  His functional ability is independent except he needs help with getting up off the toilet from time to time, just a boost as he tells me, also has some pain with laundry, mowing yard, dishes.  SOCIAL HISTORY:  Married, lives with his wife, drinks about four beers a week.  He had a DUI in 2002, non since that time.  Denies any illegal drug use.  His personal goal is to return to the  workforce and some type of sedentary job.  He like to go back to school to learn a new trade.  FAMILY HISTORY:  Diabetes, hypertension.  MEDICATIONS: 1. Lisinopril 20 mg a day. 2. Simvastatin 40 mg a day. 3. Atenolol and chlorthalidone 50/25 one p.o. daily.  ALLERGIES:  None known.  He states he still has some hydrocodone 7.5 that he takes four times a day, but did not bring a bottle in.  PHYSICAL EXAMINATION:  VITAL SIGNS:  His blood pressure is 133/77, pulse 62, respirations 18, O2 sat 100% on room air. GENERAL:  This is a well-developed, well-nourished male in no acute stress.  Orientation x3.  Affect is bright, alert.  Gait is normal. NECK:  His neck range of motion is 75% forward flexion and extension, but only 50% left lateral bending and left lateral rotation. EXTREMITIES:  His upper extremity strength range of motion and sensation are all normal.  His lower extremity strength range of motion and deep tendon reflexes are all normal.  His lumbar spine range of motion is 25% range forward flexion, extension, lateral station and bending.  His gait is forward flexed, he appears to have some tight hamstrings as well. Straight leg raising test is negative.  IMPRESSION: 1. Cervical pain.  Cervical postlaminectomy syndrome.  No evidence of     radiculopathy, does have some chronic muscle spasm, some myofascial     pain likely superimposed.  We will trial him on Skelaxin. 2. Lumbar postlaminectomy syndrome.  No radicular discomfort, but does     have increasing low back pain.  He has pain at the postoperative     site, but also inferior.  He may have new disk or recurrent disk at     L2-3.  We will check MRI given chronicity in gradual worsening.     Discussed with the patient and agrees with plan.  I will see him     back in 2-3 weeks to go over the MRI results.  We will look at     urine drug screen, consider treatment options including oxycodone.     I did indicate that I would  not be prescribing Valium as a muscle     relaxer, we look for other alternatives, nonbenzodiazepine.  We also discussed multimodal approach likely requiring some physical therapy as well as injections.     Erick Colace, M.D. Electronically Signed    AEK/MedQ D:03/10/2011 12:34:48  T:03/10/2011 23:15:46  Job #:  [id number]  cc:   Tinnie Gens A. Tawanna Cooler, MD [address] [place] [address]  Danae Orleans. Venetia Maxon, M.D. Fax: [phone]

## 2011-04-07 ENCOUNTER — Ambulatory Visit (HOSPITAL_BASED_OUTPATIENT_CLINIC_OR_DEPARTMENT_OTHER): Payer: 59 | Admitting: Physical Medicine & Rehabilitation

## 2011-04-07 ENCOUNTER — Encounter: Payer: 59 | Attending: Physical Medicine & Rehabilitation

## 2011-04-07 DIAGNOSIS — M545 Low back pain, unspecified: Secondary | ICD-10-CM | POA: Insufficient documentation

## 2011-04-07 DIAGNOSIS — Z79899 Other long term (current) drug therapy: Secondary | ICD-10-CM | POA: Insufficient documentation

## 2011-04-07 DIAGNOSIS — M961 Postlaminectomy syndrome, not elsewhere classified: Secondary | ICD-10-CM

## 2011-04-07 DIAGNOSIS — M542 Cervicalgia: Secondary | ICD-10-CM | POA: Insufficient documentation

## 2011-04-07 DIAGNOSIS — R209 Unspecified disturbances of skin sensation: Secondary | ICD-10-CM | POA: Insufficient documentation

## 2011-04-07 DIAGNOSIS — R5381 Other malaise: Secondary | ICD-10-CM | POA: Insufficient documentation

## 2011-04-07 DIAGNOSIS — M62838 Other muscle spasm: Secondary | ICD-10-CM | POA: Insufficient documentation

## 2011-04-07 DIAGNOSIS — R5383 Other fatigue: Secondary | ICD-10-CM | POA: Insufficient documentation

## 2011-04-08 NOTE — Assessment & Plan Note (Signed)
A 45 year old male with chief complaint of neck pain and back pain.  His back pain is actually worse.  We checked out a lumbar MRI.  He has no re-herniation at L2-3.  The remainder of levels have really no significant changes.  In terms of his neck, he has had prior ACDF at C5-6 level.  No signs of radiculopathy.  No complaints of pain going down the arms.  His average pain is 7/10.  Sleep is poor.  Walking tolerance is 5 minutes.  He is able to climb steps.  He needs assist with dressing, bathing, toileting, and household duties.  REVIEW OF SYSTEMS:  Positive for tremor, trouble walking, spasms, and depression.  SOCIAL HISTORY:  Married, lives with his wife.  FAMILY HISTORY:  Hypertension and disability.  He is on hydrocodone 7.5/325 q.i.d.  he thinks he would do better with 5 times per day.  We did discuss long-acting medications.  His UDS was fine.  Personal goal; return to work for some type of sedentary job.  PHYSICAL EXAMINATION:  Upper extremity strength is normal.  Neck has 50% range forward flexion/extension, lateral rotation, and bending.  Lumbar spine has 50% range forward flexion/extension, lateral rotation, and bending.  Straight leg raising test is negative.  Lower extremity deep tendon reflexes are normal.  Strength is normal.  Sensation is normal.  IMPRESSION: 1. Cervical postlaminectomy syndrome. 2. Lumbar postlaminectomy syndrome.  No evidence of radiculopathy. 3. Chronic pain syndrome.  He has fear avoidance pattern for movement.     We will get him in pool aquatic therapy, PT recommended. 4. Change of short-acting to long-acting medication.  We will change     to MS Contin 30 b.i.d.  Discussed with the patient, agrees with     plan.  I will see him back in couple weeks to assess how he is     doing.     Erick Colace, M.D. Electronically Signed    AEK/MedQ D:  04/07/2011 16:58:50  T:  04/08/2011 05:49:46  Job #:  [id number]  cc:   Clydene Fake, M.D. Fax: [phone]

## 2011-05-02 ENCOUNTER — Encounter: Payer: 59 | Attending: Neurosurgery | Admitting: Neurosurgery

## 2011-05-02 DIAGNOSIS — M961 Postlaminectomy syndrome, not elsewhere classified: Secondary | ICD-10-CM | POA: Insufficient documentation

## 2011-05-02 DIAGNOSIS — M549 Dorsalgia, unspecified: Secondary | ICD-10-CM | POA: Insufficient documentation

## 2011-05-02 DIAGNOSIS — M542 Cervicalgia: Secondary | ICD-10-CM | POA: Insufficient documentation

## 2011-05-02 DIAGNOSIS — G8929 Other chronic pain: Secondary | ICD-10-CM | POA: Insufficient documentation

## 2011-05-02 NOTE — Op Note (Signed)
NAMEJAYVON, Shepard           ACCOUNT NO.:  [id number]   MEDICAL RECORD NO.:  [id number]          PATIENT TYPE:  OIB   LOCATION:  3536                         FACILITY:  [facility]   PHYSICIAN:  Danae Orleans. Venetia Maxon, M.D.  DATE OF BIRTH:  1966-11-05   DATE OF PROCEDURE:  10/09/2008  DATE OF DISCHARGE:                               OPERATIVE REPORT   PREOPERATIVE DIAGNOSES:  Herniated cervical disk with spondylosis with  stenosis, degenerative disk disease, and radiculopathy, C5-6 level.   POSTOPERATIVE DIAGNOSES:  Herniated cervical disk with spondylosis with  stenosis, degenerative disk disease, and radiculopathy, C5-6 level.   PROCEDURE:  Anterior cervical decompression, C5-6 with microdiskectomy  with ProDisc-C disk arthroplasty (large 5 mm).   SURGEON:  Danae Orleans. Venetia Maxon, MD   ASSISTANT:  1. Georgiann Cocker, RN  2. Hewitt Shorts, MD   ANESTHESIA:  General endotracheal anesthesia.   ESTIMATED BLOOD LOSS:  Minimal.   COMPLICATIONS:  None.   DISPOSITION:  To recovery.   INDICATIONS:  Mike Shepard is a 45 year old man with herniated  cervical disk at C5-6 on the right with significant right arm pain and  weakness.  It was elected to take him to surgery for anterior cervical  decompression and the disk arthroplasty at the C5-6 level.   PROCEDURE:  Mike Shepard was brought to the operating room.  Following  satisfactory and uncomplicated induction of general endotracheal  anesthesia, the patient was placed in supine position with head on a  doughnut head holder, towels rolled in and placed behind nape of his  neck.  His shoulders were taped down after with C-arm we were unable to  visualize the C5-6 interval, however, with taping, I was able to do so  and localization of the C5-6 interspace was confirmed on AP and lateral  fluoroscopy prior to starting surgery.  Subsequently, his head was taped  into position as well on the OR table.  His anterior neck was then  prepped  and draped in the usual sterile fashion.  The area of planned  incision was infiltrated with 0.25% Marcaine and 0.5% lidocaine with  1:200,000 epinephrine.  Incision was made in the midline to the anterior  border of the sternocleidomastoid muscle and carried through platysma  layer.  Subplatysmal dissection was performed exposing the anterior  border of the sternocleidomastoid muscle.  Using blunt dissection, the  carotid sheath was kept lateral and trachea and esophagus to the medial,  exposing the anterior cervical spine.  A bent spinal needle was placed  and it was felt to be the C5-6 level.  This was confirmed on  intraoperative x-ray.  Longus colli muscles were then taken down from  the anterior cervical spine using electrocautery and Key elevator and  self-retaining shadow line retractor was placed to facilitate exposure.  Using intraoperative fluoroscopy, the distraction pins were placed in C5  and C6 using 14-mm pins after the awl was used for initial positioning.  The distraction device was then placed and the interspace was incised.  Disk material was removed in a piecemeal fashion.  Endplates were  stripped off residual disk material using variety  of Carlen curettes.  Under operating microscope, a thorough diskectomy was performed and  decompression was performed down to the posterior longitudinal ligament  and large osteophyte central to the right was removed along with  herniated disk material directly compressing the right C6 nerve root  spinal cord dura.  Both C6 nerve roots were widely decompressed.  After  hemostasis was assured and subsequently attention was then turned to  placing the disk arthroplasty implant.  Initially, a medium deep 5-mm  implant was sized.  This was felt to be too small side-to-side and  consequently, a large 5-mm implant was placed superior and countersunk  appropriately.  It appeared to fit well from both AP and lateral  projections and this was  confirmed with intraoperative x-ray.  A milling  guide was then placed and keel cuts were placed using drill and  subsequently with a chisel and the keel cuts were made inspected to make  sure that there were adequately cut.  This was done under fluoroscopy  and subsequently, a 5-mm large implant was placed and countersunk  appropriately and then tamped in position.  It was appropriately  positioned on both AP and lateral projections.  Hemostasis in the keel  cuts was performed with bone wax.  Wounds were irrigated and the  distraction pins were removed.  The wound was inspected and found to be  in good repair and hemostasis was assured.  The platysmal layer was  closed with 3-0 Vicryl sutures and skin edges were reapproximated with 2-  0 Vicryl interrupted inverted sutures.  The wound was dressed with  Dermabond.  The patient was extubated in the operating and taken to the  recovery room in stable and satisfactory condition, having tolerated his  operation well.  Counts were correct at the end of the case.      Danae Orleans. Venetia Maxon, M.D.  Electronically Signed     JDS/MEDQ  D:  10/09/2008  T:  10/10/2008  Job:  [id number]

## 2011-05-02 NOTE — Assessment & Plan Note (Signed)
HISTORY OF PRESENT ILLNESS:  Mr. Rominger returns today for evaluation for his neck and back pain.  He is status post multiple surgeries by Dr. Maeola Harman of [facility].  He was referred to [place] through his primary care doctor.  He rates his pain about 8-9 and it is a constant aching and stabbing- type pain.  He has sharp pain.  General activity level is about 8-10. Pain is worse in the morning and at night.  Sleep patterns are poor. Walking, sitting, standing tend to aggravate.  Rest and medication tend to help.  Although he had some problems with MS Contin that Dr. Wynn Banker started, we switched him to the oxycodone.  MOBILITY:  He can walk without assistance.  He walk about 5 minutes or so.  He can climb steps and drive.  He is not employed.  REVIEW OF SYSTEMS:  Notable for those difficulties as well as some trouble with ambulation and spasms.  No suicidal thoughts and no aberrant behavior at this point although he states he is taking 5-6 oxycodone a day, but this is the only medicine he had other than Skelaxin which was too expensive for him.  SOCIAL HISTORY:  He is married.  FAMILY HISTORY:  Significant for hypertension.  PHYSICAL EXAMINATION:  Vital Signs:  Blood pressure 153/91, pulse 83, respirations 18, and O2 sats 100% on room air.  Neurologic:  His motor strength is good.  Sensation is good in his lower extremities and upper extremities.  He is alert and oriented x3.  His affect appears to be within normal limits.  Constitutionally, he is within normal limits.  ASSESSMENT: 1. Cervicalgia with post cervical laminectomy syndrome. 2. Post laminectomy syndrome. 3. Chronic pain.  PLAN: 1. We are going to go ahead and start him on Mobic 15 mg a day.  I     gave him 30 with 2 refills. 2. Robaxin 500 mg 1 p.o. b.i.d. I gave him 60 with 3 refills.  He will     stop the Skelaxin due to cost. 3. He requested to go up on oxycodone and we are not going to do  that     at this time.  We wrote him a prescription for oxycodone 7.5/325     one p.o. daily 120 with no refill.  He will follow up in our office     in 1 month.  His questions were encouraged and answered.     Makael Stein L. Blima Dessert Electronically Signed    RLW/MedQ D:  05/02/2011 08:46:48  T:  05/02/2011 18:54:27  Job #:  [id number]

## 2011-05-05 ENCOUNTER — Ambulatory Visit: Payer: 59 | Admitting: Physical Medicine & Rehabilitation

## 2011-05-05 NOTE — Op Note (Signed)
[name], Mike Shepard           ACCOUNT NO.:  [id number]   MEDICAL RECORD NO.:  [id number]          PATIENT TYPE:  OIB   LOCATION:  2899                         FACILITY:  [facility]   PHYSICIAN:  Clydene Fake, M.D.  DATE OF BIRTH:  [birth year]   DATE OF PROCEDURE:  10/06/2004  DATE OF DISCHARGE:                                 OPERATIVE REPORT   PREOPERATIVE DIAGNOSIS:  Herniated nucleus pulposus right L2-3.   POSTOPERATIVE DIAGNOSIS:  Herniated nucleus pulposus right L2-3.   PROCEDURE:  Right L2-3 semi-hemilaminectomy and diskectomy, microdissection  with microscope.   SURGEON:  Clydene Fake, M.D.   ASSISTANT:  Cristi Loron, M.D.   ANESTHESIA:  General endotracheal tube.   ESTIMATED BLOOD LOSS:  Minimal.   DRAINS:  None.   COMPLICATIONS:  None.   REASON FOR PROCEDURE:  The patient is a 45 year old right-handed gentleman  who has had back ache about a month ago which has become severe pain down  into his right hip and lateral anterior thigh with numbness.  MRI was done  showing large extruded fragment extending cephalad, 2-3 disk on the right  side and the patient is brought in for diskectomy.   DESCRIPTION OF PROCEDURE:  The patient was brought to the operating room,  general anesthesia was induced.  The patient was placed in the prone  position on the Wilson frame with all pressure points padded.  The patient  was prepped and draped in a sterile fashion.  Needle was placed in the  interspace, x-ray was obtained showing needle was pointed at the 2-3 spinous  process interspace.  Incision was made and centered over that area, incision  taken down to the fascia, and hemostasis was obtained with Bovie  cauterization.  The fascia was incised with the Bovie and subperiosteal  dissection was done over the L2-3 spinous processes of the lamina out to the  facets.   Self-retaining retractors were placed, marker was placed at the 2-3  interspace. X-ray was obtained  again, confirming our positioning at the L2-3  level.  Microscope was brought in for microdissection.  At this point, a  high speed drill was used to start a semi-hemilaminectomy and medial  facetectomy. This was completed with Kerrison punches, and fascia lata was  removed.  Neuroforaminotomy was done at the L3 root. We explored the disk  space.  __________ disk space. As we extended cephalad, we ran into a large,  extruded fragment. This was removed. As we were removing it, we could see  disk squirting out from underneath the ligament, heading down towards the 2-  3 disk. We incised this ligament, incised the disk space and diskectomy was  performed with pituitary rongeurs and curets.  When we were finished, we had  good decompression, the central canal in the L2 and L3 roots.   Hemostasis was obtained with bipolar cauterization, and Gelfoam and  thrombin.  The wound was irrigated out with antibiotic solution.  Retractors  were removed. Fascia was closed with 0-Vicryl interrupted suture,  subcutaneous tissue was closed with 0, 2 and 3-0 interrupted chromic suture.  Skin was  closed with Benzoin and Steri-Strips. Dressing was placed.  Patient  was placed back in the supine position, awakened from anesthesia and the  brought to the recovery room in stable condition.       JRH/MEDQ  D:  10/06/2004  T:  10/06/2004  Job:  [id number]

## 2011-05-09 ENCOUNTER — Other Ambulatory Visit: Payer: Self-pay | Admitting: Family Medicine

## 2011-05-31 ENCOUNTER — Other Ambulatory Visit: Payer: Self-pay | Admitting: Family Medicine

## 2011-05-31 ENCOUNTER — Encounter: Payer: 59 | Attending: Neurosurgery | Admitting: Neurosurgery

## 2011-05-31 DIAGNOSIS — M542 Cervicalgia: Secondary | ICD-10-CM | POA: Insufficient documentation

## 2011-05-31 DIAGNOSIS — M961 Postlaminectomy syndrome, not elsewhere classified: Secondary | ICD-10-CM

## 2011-05-31 DIAGNOSIS — G8929 Other chronic pain: Secondary | ICD-10-CM | POA: Insufficient documentation

## 2011-05-31 DIAGNOSIS — M549 Dorsalgia, unspecified: Secondary | ICD-10-CM | POA: Insufficient documentation

## 2011-06-01 NOTE — Assessment & Plan Note (Signed)
HISTORY OF PRESENT ILLNESS:  Mike Shepard is a patient of Dr. Wynn Shepard who is followed up here for cervicalgia as well as low back pain.  He reports that the Mobic was started on last appointment and Robaxin has helped.  He now rates his pain at about 7-9, it is fluctuating, it is a constant pain.  It is sharp, burning, and aching. Pain is worse with walking, bending, and standing.  Heat and medication tend to help.  I have encouraged the patient to keep moving about instead of sitting around the home, the more he does, the more he will be able to do, and he understands this.  Mobility, he walks without assistance.  He can walk about 15 minutes, climb steps.  He drives.  He is on disability.  He needs assistance with some ADLs.  REVIEW OF SYSTEMS:  Notable for those difficulties and some spasms and numbness, trouble with ambulation from time to time.  PAST MEDICAL HISTORY:  Unchanged.  SOCIAL HISTORY:  Unchanged.  FAMILY HISTORY:  He is married and lives with foster children and his wife.  PHYSICAL EXAMINATION:  VITAL SIGNS:  Blood pressure today is extremely high.  It is 197/106 on the machine Mike Shepard recorded over 200/100 manually.  Pulse is 72, respirations 89, O2 sats 99 on room air.  He is encouraged to see his primary care doctor today. NEUROLOGICAL:  Constitutionally he is within normal limits. He is oriented x3.  His affect is bright and alert.  Motor strength is good sensation is good, overall improved.  ASSESSMENT: 1. Cervicalgia with post laminectomy syndrome. 2. Cervical and lumbar chronic pain.  PLAN: 1. I went ahead and refilled his oxycodone 7.5/325 one p.o. q.i.d. 120     with no refill.  He was encouraged to stick to that.  He says he     has been taking up to 5 a day and I have encouraged him not to do     that. 2. He will continue his Robaxin. 3. He will continue his Mobic. 4. He will follow up here in the clinic in 1 month.  His questions     were  encouraged and answered.  His Oswestry score today is 54.     Mike Shepard L. Blima Dessert Electronically Signed    RLW/MedQ D:  05/31/2011 09:27:53  T:  06/01/2011 01:33:02  Job #:  [id number]

## 2011-06-07 ENCOUNTER — Other Ambulatory Visit: Payer: Self-pay | Admitting: Family Medicine

## 2011-06-07 MED ORDER — ATENOLOL-CHLORTHALIDONE 50-25 MG PO TABS: 1.0000 | ORAL_TABLET | Freq: Every day | ORAL | Status: DC

## 2011-06-07 NOTE — Telephone Encounter (Signed)
Pt called and said that he went to pharmacy to pick up refill of Atenolol, but pharmacist told pt that there were no refills remaining. Pt is out of med and needs this called in asap today to [facility] on [address].

## 2011-06-29 ENCOUNTER — Encounter: Payer: 59 | Attending: Physical Medicine & Rehabilitation

## 2011-06-29 ENCOUNTER — Ambulatory Visit (HOSPITAL_BASED_OUTPATIENT_CLINIC_OR_DEPARTMENT_OTHER): Payer: 59 | Admitting: Physical Medicine & Rehabilitation

## 2011-06-29 DIAGNOSIS — M545 Low back pain, unspecified: Secondary | ICD-10-CM | POA: Insufficient documentation

## 2011-06-29 DIAGNOSIS — R209 Unspecified disturbances of skin sensation: Secondary | ICD-10-CM | POA: Insufficient documentation

## 2011-06-29 DIAGNOSIS — M542 Cervicalgia: Secondary | ICD-10-CM | POA: Insufficient documentation

## 2011-06-29 DIAGNOSIS — R5381 Other malaise: Secondary | ICD-10-CM | POA: Insufficient documentation

## 2011-06-29 DIAGNOSIS — M961 Postlaminectomy syndrome, not elsewhere classified: Secondary | ICD-10-CM

## 2011-06-29 DIAGNOSIS — Z79899 Other long term (current) drug therapy: Secondary | ICD-10-CM | POA: Insufficient documentation

## 2011-06-29 DIAGNOSIS — R5383 Other fatigue: Secondary | ICD-10-CM | POA: Insufficient documentation

## 2011-06-29 DIAGNOSIS — M62838 Other muscle spasm: Secondary | ICD-10-CM | POA: Insufficient documentation

## 2011-07-28 ENCOUNTER — Encounter: Payer: 59 | Attending: Neurosurgery | Admitting: Neurosurgery

## 2011-07-28 DIAGNOSIS — M545 Low back pain, unspecified: Secondary | ICD-10-CM

## 2011-07-28 DIAGNOSIS — M542 Cervicalgia: Secondary | ICD-10-CM | POA: Insufficient documentation

## 2011-07-28 DIAGNOSIS — M549 Dorsalgia, unspecified: Secondary | ICD-10-CM | POA: Insufficient documentation

## 2011-07-28 DIAGNOSIS — M961 Postlaminectomy syndrome, not elsewhere classified: Secondary | ICD-10-CM | POA: Insufficient documentation

## 2011-07-28 DIAGNOSIS — G8929 Other chronic pain: Secondary | ICD-10-CM | POA: Insufficient documentation

## 2011-07-28 DIAGNOSIS — G894 Chronic pain syndrome: Secondary | ICD-10-CM

## 2011-07-28 NOTE — Assessment & Plan Note (Signed)
Account [id number].  This is a patient of Dr. Wynn Banker followed up with him last month regarding his cervicalgia and low back pain.  He states that Mobic is helping.  He has Robaxin on board.  He states he has been more active. He is back in school.  He is working part time and doing well.  He appears happy today.  His average pain is 6 or 7, it is constant aching with a sharp burning sensation.  General activity level is 4-8.  The pain is same 24 hours a day.  Pain is worse with walking, bending, and activity and standing.  Heat and ice therapy and medication tend to help.  He walks without assistance.  He climb steps and drive.  He can walk up to 45 minutes at a time.  He is working at least 25 hours a week.  REVIEW OF SYSTEMS:  Notable for those difficulties describe above as well as some spasm.  His Oswestry score today is 38.  No signs of aberrant behavior.  PAST MEDICAL HISTORY:  Unchanged.  SOCIAL HISTORY:  Unchanged.  FAMILY HISTORY:  Unchanged.  PHYSICAL EXAMINATION:  VITAL SIGNS:  Blood pressure 152/87, pulse 102, respirations 16, O2 sats 99 on room air.  His motor strength is 5/5 in the upper and lower extremities.  His sensation is intact. Constitutionally, he is within normal limits.  He is alert and oriented x3.  ASSESSMENT: 1. Cervicalgia with post laminectomy syndrome. 2. Cervical and chronic lumbar pain.  PLAN: 1. I encouraged him to keep up his activity and continue to be more     active and he agreed. 2. Oxycodone 7.5/325 one p.o. q.i.d. 120 with no refill. 3. He will follow up here in 1 month.  His questions were encouraged     and answered.     Shannan Slinker L. Blima Dessert Electronically Signed    RLW/MedQ D:  07/28/2011 12:34:45  T:  07/28/2011 19:11:30  Job #:  [id number]

## 2011-08-25 ENCOUNTER — Ambulatory Visit: Payer: 59 | Admitting: Neurosurgery

## 2011-09-01 ENCOUNTER — Emergency Department (HOSPITAL_COMMUNITY): Payer: Self-pay

## 2011-09-01 ENCOUNTER — Inpatient Hospital Stay (HOSPITAL_COMMUNITY)
Admission: EM | Admit: 2011-09-01 | Discharge: 2011-09-03 | DRG: 683 | Disposition: A | Discharge status: Home / Self Care | Payer: Self-pay | Attending: Internal Medicine | Admitting: Internal Medicine

## 2011-09-01 DIAGNOSIS — I498 Other specified cardiac arrhythmias: Secondary | ICD-10-CM | POA: Diagnosis present

## 2011-09-01 DIAGNOSIS — Z79899 Other long term (current) drug therapy: Secondary | ICD-10-CM

## 2011-09-01 DIAGNOSIS — E871 Hypo-osmolality and hyponatremia: Secondary | ICD-10-CM | POA: Diagnosis present

## 2011-09-01 DIAGNOSIS — A088 Other specified intestinal infections: Secondary | ICD-10-CM | POA: Diagnosis present

## 2011-09-01 DIAGNOSIS — I10 Essential (primary) hypertension: Secondary | ICD-10-CM | POA: Diagnosis present

## 2011-09-01 DIAGNOSIS — N179 Acute kidney failure, unspecified: Principal | ICD-10-CM | POA: Diagnosis present

## 2011-09-01 DIAGNOSIS — F17200 Nicotine dependence, unspecified, uncomplicated: Secondary | ICD-10-CM | POA: Diagnosis present

## 2011-09-01 DIAGNOSIS — E785 Hyperlipidemia, unspecified: Secondary | ICD-10-CM | POA: Diagnosis present

## 2011-09-01 LAB — COMPREHENSIVE METABOLIC PANEL
ALT: 16 U/L (ref 0–53)
AST: 13 U/L (ref 0–37)
Albumin: 4.4 g/dL (ref 3.5–5.2)
Alkaline Phosphatase: 110 U/L (ref 39–117)
BUN: 41 mg/dL — ABNORMAL HIGH (ref 6–23)
CO2: 25 mEq/L (ref 19–32)
Calcium: 10.2 mg/dL (ref 8.4–10.5)
Chloride: 88 mEq/L — ABNORMAL LOW (ref 96–112)
Creatinine, Ser: 3.47 mg/dL — ABNORMAL HIGH (ref 0.50–1.35)
GFR calc Af Amer: 23 mL/min — ABNORMAL LOW (ref >60)
GFR calc non Af Amer: 19 mL/min — ABNORMAL LOW (ref >60)
Glucose, Bld: 109 mg/dL — ABNORMAL HIGH (ref 70–99)
Potassium: 4.4 mEq/L (ref 3.5–5.1)
Sodium: 126 mEq/L — ABNORMAL LOW (ref 135–145)
Total Bilirubin: 0.8 mg/dL (ref 0.3–1.2)
Total Protein: 8.3 g/dL (ref 6.0–8.3)

## 2011-09-01 LAB — DIFFERENTIAL
Basophils Absolute: 0 10*3/uL (ref 0.0–0.1)
Basophils Relative: 0 % (ref 0–1)
Eosinophils Absolute: 0 10*3/uL (ref 0.0–0.7)
Eosinophils Relative: 0 % (ref 0–5)
Lymphocytes Relative: 16 % (ref 12–46)
Lymphs Abs: 2.5 10*3/uL (ref 0.7–4.0)
Monocytes Absolute: 1.7 10*3/uL — ABNORMAL HIGH (ref 0.1–1.0)
Monocytes Relative: 11 % (ref 3–12)
Neutro Abs: 11.2 10*3/uL — ABNORMAL HIGH (ref 1.7–7.7)
Neutrophils Relative %: 73 % (ref 43–77)

## 2011-09-01 LAB — URINALYSIS, ROUTINE W REFLEX MICROSCOPIC
Glucose, UA: NEGATIVE mg/dL
Hgb urine dipstick: NEGATIVE
Leukocytes, UA: NEGATIVE
Nitrite: NEGATIVE
Protein, ur: 30 mg/dL — AB
Specific Gravity, Urine: 1.025 (ref 1.005–1.030)
Urobilinogen, UA: 1 mg/dL (ref 0.0–1.0)
pH: 5 (ref 5.0–8.0)

## 2011-09-01 LAB — CBC
HCT: 45.5 % (ref 39.0–52.0)
Hemoglobin: 16.6 g/dL (ref 13.0–17.0)
MCH: 31 pg (ref 26.0–34.0)
MCHC: 36.5 g/dL — ABNORMAL HIGH (ref 30.0–36.0)
MCV: 85 fL (ref 78.0–100.0)
Platelets: ADEQUATE 10*3/uL (ref 150–400)
RBC: 5.35 MIL/uL (ref 4.22–5.81)
RDW: 12.9 % (ref 11.5–15.5)
WBC: 15.4 10*3/uL — ABNORMAL HIGH (ref 4.0–10.5)

## 2011-09-01 LAB — URINE MICROSCOPIC-ADD ON

## 2011-09-01 LAB — LIPASE, BLOOD: Lipase: 41 U/L (ref 11–59)

## 2011-09-02 ENCOUNTER — Emergency Department (HOSPITAL_COMMUNITY): Payer: Self-pay

## 2011-09-02 LAB — BASIC METABOLIC PANEL
BUN: 37 mg/dL — ABNORMAL HIGH (ref 6–23)
CO2: 28 mEq/L (ref 19–32)
Calcium: 9.6 mg/dL (ref 8.4–10.5)
Chloride: 93 mEq/L — ABNORMAL LOW (ref 96–112)
Creatinine, Ser: 2.28 mg/dL — ABNORMAL HIGH (ref 0.50–1.35)
GFR calc Af Amer: 38 mL/min — ABNORMAL LOW (ref >60)
GFR calc non Af Amer: 31 mL/min — ABNORMAL LOW (ref >60)
Glucose, Bld: 98 mg/dL (ref 70–99)
Potassium: 4 mEq/L (ref 3.5–5.1)
Sodium: 130 mEq/L — ABNORMAL LOW (ref 135–145)

## 2011-09-02 LAB — CBC
HCT: 40 % (ref 39.0–52.0)
Hemoglobin: 14.2 g/dL (ref 13.0–17.0)
MCH: 30.3 pg (ref 26.0–34.0)
MCHC: 35.5 g/dL (ref 30.0–36.0)
MCV: 85.3 fL (ref 78.0–100.0)
Platelets: 226 10*3/uL (ref 150–400)
RBC: 4.69 MIL/uL (ref 4.22–5.81)
RDW: 12.9 % (ref 11.5–15.5)
WBC: 9 10*3/uL (ref 4.0–10.5)

## 2011-09-02 LAB — DIFFERENTIAL
Basophils Absolute: 0 10*3/uL (ref 0.0–0.1)
Basophils Relative: 0 % (ref 0–1)
Eosinophils Absolute: 0.1 10*3/uL (ref 0.0–0.7)
Eosinophils Relative: 1 % (ref 0–5)
Lymphocytes Relative: 45 % (ref 12–46)
Lymphs Abs: 4.1 10*3/uL — ABNORMAL HIGH (ref 0.7–4.0)
Monocytes Absolute: 1.3 10*3/uL — ABNORMAL HIGH (ref 0.1–1.0)
Monocytes Relative: 15 % — ABNORMAL HIGH (ref 3–12)
Neutro Abs: 3.5 10*3/uL (ref 1.7–7.7)
Neutrophils Relative %: 39 % — ABNORMAL LOW (ref 43–77)

## 2011-09-02 LAB — PSA: PSA: 2.03 ng/mL (ref <=4.00)

## 2011-09-03 LAB — URINE CULTURE
Colony Count: NO GROWTH
Culture  Setup Time: 201209150643
Culture: NO GROWTH

## 2011-09-03 LAB — CBC
HCT: 39.4 % (ref 39.0–52.0)
Hemoglobin: 14 g/dL (ref 13.0–17.0)
MCH: 30.7 pg (ref 26.0–34.0)
MCHC: 35.5 g/dL (ref 30.0–36.0)
MCV: 86.4 fL (ref 78.0–100.0)
Platelets: 209 10*3/uL (ref 150–400)
RBC: 4.56 MIL/uL (ref 4.22–5.81)
RDW: 12.8 % (ref 11.5–15.5)
WBC: 7.6 10*3/uL (ref 4.0–10.5)

## 2011-09-03 LAB — BASIC METABOLIC PANEL
BUN: 21 mg/dL (ref 6–23)
CO2: 24 mEq/L (ref 19–32)
Calcium: 9.5 mg/dL (ref 8.4–10.5)
Chloride: 98 mEq/L (ref 96–112)
Creatinine, Ser: 1.25 mg/dL (ref 0.50–1.35)
GFR calc Af Amer: >60 mL/min (ref >60)
GFR calc non Af Amer: >60 mL/min (ref >60)
Glucose, Bld: 95 mg/dL (ref 70–99)
Potassium: 4.2 mEq/L (ref 3.5–5.1)
Sodium: 131 mEq/L — ABNORMAL LOW (ref 135–145)

## 2011-09-03 NOTE — H&P (Signed)
NAMEARNET, HOFFERBER           ACCOUNT NO.:  [id number]  MEDICAL RECORD NO.:  [id number]  LOCATION:  WLED                         FACILITY:  [facility]  PHYSICIAN:  Della Goo, M.D. DATE OF BIRTH:  02/03/1966  DATE OF ADMISSION:  09/01/2011                             HISTORY & PHYSICAL   HISTORY OF PRESENT ILLNESS:  Mr. Mike Shepard is a 45 year old gentleman, who presented to the ED tonight with a 2- to 3-day history of nausea and vomiting with epigastric pain.  He denies any fevers, chills, any shortness of breath.  He denies any diarrhea.  He has not noted any hematemesis, hematochezia, or melena.  He denies any sick contacts.  He indicates that he has been able to keep very little on his stomach and he has noticed a decrease in frequency and volume of urine output.  PAST MEDICAL HISTORY: 1. Hypertension. 2. Hyperlipidemia. 3. Chronic neck and back pain.  PAST SURGICAL HISTORY:  Multiple surgeries to his neck and back by Dr. Maeola Harman at [facility].  His last surgery was in 2010.  ALLERGIES:  No known drug allergies.  MEDICATIONS: 1. Atenolol/HCTZ 50/25 daily. 2. Zestril 40 mg daily.  FAMILY HISTORY:  His mother is living and has a history of hypertension and TIAs.  His father is deceased from complications related to diabetes mellitus.  He has one younger brother with a history of diabetes and hypertension and one younger sister with a history of hypertension.  SOCIAL HISTORY:  Mr. Mayabb is married.  He has grown children.  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  He smokes approximately half a pack of cigarettes per day.  He drinks alcohol occasionally.  He denies any illegal drug use.  REVIEW OF SYSTEMS:  All systems reviewed and are unremarkable other than  noted in history of present illness.  PHYSICAL EXAMINATION:  VITAL SIGNS:  Temperature 98.2, pulse 66, blood pressure 100/66, respirations 18, oxygen saturation 100% on room air. GENERAL:   Alert, oriented x3, in no acute distress. HEENT:  Pupils are equal and reactive to light and accommodation. Extraocular movements are intact.  Oropharynx is clear, but dry. LUNGS:  Clear to auscultation and percussion bilaterally. CARDIAC EXAM:  Regular rate and rhythm without murmurs, rubs, or gallops. ABDOMEN:  Soft, mildly tender in the mid epigastric area.  No organomegaly or masses appreciated. EXTREMITIES:  Without edema or cyanosis.  2+ pedal pulses. GENITOURINARY:  Foley catheter in place with cloudy yellow urine LYMPH NODE:  Survey negative. NEUROLOGIC EXAM:  Without focal deficits.  LABORATORY DATA:  WBC 15.4, hemoglobin 16.6, hematocrit 45.5, platelet count described as platelet clumps.  Differential neutrophil 73%, lymphocytes 16% monocytes, 11% eosinophils 0%.  CMP: sodium 126, potassium 4.4, chloride 88, CO2 25, glucose 109, BUN 41, creatinine 3.47, total bilirubin 0.8, alkaline phosphatase 110, AST 13, ALT 16, lipase 41.  IMPRESSION AND PLAN: 1. Nausea, vomiting, and dehydration.  Admit to inpatient triad     hospitalist team #1, p.r.n. antiemetics, IV hydration. 2. Hyponatremia, IV hydration with normal saline. 3. Abdominal pain.  CT of the abdomen and pelvis showed no acute     abnormality, p.r.n. pain medications. 4. Acute renal failure, evaluate renal ultrasound.  5. Fluids, electrolytes, nutrition.  IV hydration with normal saline.     Electrolytes monitored and addressed as indicated.  Renal diet. 6. Prophylaxis.  Deep venous thrombosis prophylaxis with Lovenox. 7. Ethics:  Full code.    ______________________________ Elray Mcgregor, NP   ______________________________ Della Goo, M.D.    ML/MEDQ  D:  09/02/2011  T:  09/02/2011  Job:  [id number]  Electronically Signed by Elray Mcgregor ANP-BC on 09/02/2011 07:36:00 PM Electronically Signed by Della Goo M.D. on 09/03/2011 09:53:42 PM

## 2011-09-18 LAB — BASIC METABOLIC PANEL WITH GFR
BUN: 20
BUN: 15
CO2: 24
CO2: 30
Calcium: 10.3
Calcium: 10
Chloride: 105
Chloride: 102
Creatinine, Ser: 1.2
Creatinine, Ser: 1.23
GFR calc non Af Amer: >60
GFR calc non Af Amer: >60
Glucose, Bld: 96
Glucose, Bld: 112 — ABNORMAL HIGH
Potassium: 4.6
Potassium: 4.1
Sodium: 138
Sodium: 138

## 2011-09-18 LAB — CBC
HCT: 50
HCT: 45.3
Hemoglobin: 17.2 — ABNORMAL HIGH
Hemoglobin: 15.6
MCHC: 34.5
MCHC: 34.4
MCV: 90.4
MCV: 91.2
Platelets: 200
Platelets: 197
RBC: 5.53
RBC: 4.97
RDW: 12.3
RDW: 11.9
WBC: 8.5
WBC: 5.8

## 2011-09-18 LAB — DIFFERENTIAL
Basophils Absolute: 0.1
Basophils Relative: 1
Eosinophils Absolute: 0.2
Eosinophils Relative: 2
Lymphocytes Relative: 20
Lymphs Abs: 1.7
Monocytes Absolute: 0.4
Monocytes Relative: 4
Neutro Abs: 6.1
Neutrophils Relative %: 73

## 2011-09-18 LAB — PROTIME-INR
INR: 0.9
Prothrombin Time: 12.4

## 2011-09-18 LAB — APTT: aPTT: 34

## 2011-10-03 NOTE — Discharge Summary (Signed)
  Mike Shepard, Mike Shepard           ACCOUNT NO.:  [id number]  MEDICAL RECORD NO.:  [id number]  LOCATION:  1401                         FACILITY:  [facility]  PHYSICIAN:  Mike Neuzil I Sache Sane, MD      DATE OF BIRTH:  1966-04-18  DATE OF ADMISSION:  09/01/2011 DATE OF DISCHARGE:  09/03/2011                              DISCHARGE SUMMARY   DISCHARGE DIAGNOSES: 1. Nausea, vomiting likely secondary to viral gastroenteritis. 2. History of peptic ulcer disease, need to be followed and monitored     by his primary care physician. 3. Acute renal failure, resolved. 4. Dehydration, resolved. 5. Electrolyte imbalance, corrected.  DISCHARGE MEDICATIONS: 1. Prevacid 30 mg p.o. daily. 2. Zofran 4 mg p.o. q.6 hours p.r.n. for nausea.  CONSULTATIONS:  None.  PROCEDURE: 1. CT of abdomen and pelvis, negative. 2. Ultrasound of abdomen and pelvis, unremarkable.  HISTORY OF PRESENT ILLNESS:  Please see review the history done by Dr. Della Shepard.  The patient presented to [place] 2 to 3 days' history of nausea and vomiting with epigastric pain.  He denies any fever or chills.  Denies any chest pain or shortness of breath.  He has a history of peptic ulcer disease in the past as per the patient.  PHYSICAL EXAMINATION:  VITAL SIGNS:  On examination, temperature 98.2, pulse rate 66, blood pressure 100/66, respiratory rate 18, O2 saturation 100% on room air. ABDOMEN:  On evaluation in the emergency room, abdomen examination, soft, mildly tender in the midepigastric area.  No organomegaly or masses appreciated.  Currently on my examination: VITAL SIGNS:  Temperature 98.4, pulse rate 50, respiratory rate 16, blood pressure 115/77. GENERAL:  The patient lying comfortable, not in respiratory distress or shortness of breath. HEART:  S1 and S2.  No added murmur. LUNGS:  Normal vesicular breathing with equal air entry. ABDOMEN:  Soft, nontender.  Bowel sound positive.  No rebound,  no guarding.  ASSESSMENT/PLAN: 1. Nausea, vomiting, and abdominal pain.  The patient treated     supportively and we will discharge the patient with Prevacid 30 mg     p.o. daily and the patient tolerated diet really well.  We     recommend to follow up with his MD.  Has a history of peptic ulcer,     may need to have an endoscopy.  Primary care may need to have     Helicobacter pylori antibodies, which was not done during this     admission. 2. Acute renal failure, resolved. 3. Sinus bradycardia.  We will hold his atenolol and lisinopril.  We     will place the patient on small dose Norvasc 5 mg p.o. daily. till     he sees his MD.  His blood pressure remained stable during     hospitalization.  Currently, we feel the patient is medically     stable for discharge.     Mike Ruehl Bosie Helper, MD     HIE/MEDQ  D:  09/03/2011  T:  09/03/2011  Job:  [id number]  cc:   Mike Gens A. Tawanna Cooler, MD [address] [place] [address]  Electronically Signed by Mike Cargo MD on 10/03/2011 11:52:47 AM

## 2011-10-27 ENCOUNTER — Encounter (HOSPITAL_COMMUNITY): Payer: Self-pay | Admitting: *Deleted

## 2011-10-27 ENCOUNTER — Emergency Department (HOSPITAL_COMMUNITY)
Admission: EM | Admit: 2011-10-27 | Discharge: 2011-10-27 | Disposition: A | Discharge status: Home / Self Care | Payer: Self-pay | Attending: Emergency Medicine | Admitting: Emergency Medicine

## 2011-10-27 DIAGNOSIS — F17200 Nicotine dependence, unspecified, uncomplicated: Secondary | ICD-10-CM | POA: Insufficient documentation

## 2011-10-27 DIAGNOSIS — M549 Dorsalgia, unspecified: Secondary | ICD-10-CM

## 2011-10-27 DIAGNOSIS — K219 Gastro-esophageal reflux disease without esophagitis: Secondary | ICD-10-CM | POA: Insufficient documentation

## 2011-10-27 DIAGNOSIS — I10 Essential (primary) hypertension: Secondary | ICD-10-CM | POA: Insufficient documentation

## 2011-10-27 DIAGNOSIS — E785 Hyperlipidemia, unspecified: Secondary | ICD-10-CM | POA: Insufficient documentation

## 2011-10-27 HISTORY — DX: Gastro-esophageal reflux disease without esophagitis: K21.9

## 2011-10-27 HISTORY — DX: Hyperlipidemia, unspecified: E78.5

## 2011-10-27 HISTORY — DX: Essential (primary) hypertension: I10

## 2011-10-27 MED ORDER — OXYCODONE-ACETAMINOPHEN 5-325 MG PO TABS
2.0000 | ORAL_TABLET | Freq: Once | ORAL | Status: AC
  Administered 2011-10-27: 2 via ORAL
  Filled 2011-10-27: qty 2

## 2011-10-27 MED ORDER — HYDROCODONE-ACETAMINOPHEN 5-325 MG PO TABS: ORAL_TABLET | ORAL | Status: DC

## 2011-10-27 MED ORDER — DIAZEPAM 5 MG PO TABS: ORAL_TABLET | ORAL | Status: DC

## 2011-10-27 MED ORDER — DIAZEPAM 5 MG PO TABS
5.0000 mg | ORAL_TABLET | Freq: Once | ORAL | Status: AC
  Administered 2011-10-27: 5 mg via ORAL
  Filled 2011-10-27: qty 1

## 2011-10-27 NOTE — ED Provider Notes (Signed)
History     CSN: [id number] Arrival date & time: 10/27/2011  6:00 PM   First MD Initiated Contact with Patient 10/27/11 2030      Chief Complaint  Patient presents with  . Back Pain    (Consider location/radiation/quality/duration/timing/severity/associated sxs/prior treatment) Patient is a 45 y.o. male presenting with back pain. The history is provided by the patient.  Back Pain  This is a recurrent problem. The current episode started 6 to 12 hours ago. The problem occurs constantly. The pain is present in the lumbar spine. The symptoms are aggravated by twisting, certain positions and bending. Pertinent negatives include no fever, no numbness, no weight loss, no bowel incontinence, no perianal numbness, no bladder incontinence, no leg pain, no paresthesias, no tingling and no weakness. He has tried NSAIDs and muscle relaxants for the symptoms. The treatment provided mild relief.   H/o lower back pain s/p lumbar surgery. Took tylenol and flexeril with some relief. No red flag s/s of lower back pain. Pain began this morning after raking leaves. Able to walk with discomfort.   Past Medical History  Diagnosis Date  . Hypertension   . GERD (gastroesophageal reflux disease)   . Hyperlipidemia     Past Surgical History  Procedure Date  . Back surgery   . Neck surgery     Family History  Problem Relation Age of Onset  . Hypertension Mother   . Hypertension Father   . Diabetes Father   . Hypertension Sister   . Hypertension Brother   . Diabetes Brother     History  Substance Use Topics  . Smoking status: Current Everyday Smoker    Last Attempt to Quit: 01/27/2010  . Smokeless tobacco: Not on file  . Alcohol Use: 1.2 oz/week    1 Glasses of wine, 1 Cans of beer per week      Review of Systems  Constitutional: Negative for fever, weight loss and unexpected weight change.  Gastrointestinal: Negative for nausea, vomiting, constipation and bowel incontinence.       Neg  for fecal incontinence  Genitourinary: Negative for bladder incontinence, flank pain and difficulty urinating.  Musculoskeletal: Positive for back pain.  Skin: Negative for color change.  Neurological: Negative for tingling, weakness, numbness and paresthesias.       Neg for numbness, tingling, or weakness in extremities. Neg for saddle paresthesias.     Allergies  Review of patient's allergies indicates no known allergies.  Home Medications   Current Outpatient Rx  Name Route Sig Dispense Refill  . ACETAMINOPHEN 500 MG PO TABS Oral Take 1,500 mg by mouth every 6 (six) hours as needed. For pain.     . ATENOLOL-CHLORTHALIDONE 50-25 MG PO TABS Oral Take 1 tablet by mouth daily. 100 tablet 2  . CYCLOBENZAPRINE HCL 10 MG PO TABS  1 pop qhs 50 tablet 1  . IBUPROFEN 200 MG PO TABS Oral Take 200 mg by mouth every 6 (six) hours as needed. For pain.     Marland Kitchen LISINOPRIL 40 MG PO TABS Oral Take 1 tablet (40 mg total) by mouth daily. 100 tablet 3  . THERA M PLUS PO TABS Oral Take 1 tablet by mouth daily.      Marland Kitchen OMEPRAZOLE 10 MG PO CPDR Oral Take 10 mg by mouth daily.      Marland Kitchen SIMVASTATIN 40 MG PO TABS  TAKE ONE TABLET BY MOUTH AT BEDTIME 30 tablet 2  . VITAMIN C 500 MG PO TABS Oral Take 500 mg  by mouth daily.        BP 161/88  Pulse 83  Temp(Src) 98.2 F (36.8 C) (Oral)  Resp 16  SpO2 99%  Physical Exam  Constitutional: He is oriented to person, place, and time. He appears well-developed and well-nourished.  HENT:  Head: Normocephalic and atraumatic.  Eyes: Conjunctivae are normal.  Neck: Normal range of motion.  Abdominal: Soft. There is no tenderness.  Musculoskeletal: Normal range of motion. He exhibits no tenderness.       No step-off with palpation of spine. There is no spinal tenderness. There is lumbar paraspinal tenderness.   Neurological: He is alert and oriented to person, place, and time. He has normal reflexes. He exhibits normal muscle tone.       5/5 strength in entire lower  extremities bilaterally. No sensation deficit.   Skin: Skin is warm and dry.  Psychiatric: He has a normal mood and affect.    ED Course  Procedures (including critical care time)  Labs Reviewed - No data to display No results found.   1. Back pain     8:57 PM Patient seen and examined. Pain medicine and muscle relaxer ordered.   8:57 PM Patient was counseled on back pain precautions and told to do activity as tolerated but do not lift, push, or pull heavy objects more than 10 pounds.  Patient counseled to use ice or heat on back for no longer than 15 minutes every hour.  Questions answered.  Patient verbalized understanding.   8:57 PM Patient counseled on use of narcotic pain medications. Counseled not to combine these medications with others containing tylenol. Urged not to drink alcohol, drive, or perform any other activities that requires focus while taking these medications. The patient verbalizes understanding and agrees with the plan. 8:57 PM Patient counseled on proper use of muscle relaxant medication.  They were told not to drink alcohol, drive any vehicle, or do any dangerous activities while taking this medication.  Patient verbalized understanding.    MDM  Patient with back pain.  No neurological deficits and normal neuro exam.  Patient can walk but states is painful.  No loss of bowel or bladder control.  No concern for cauda equina.  No fever, night sweats, weight loss, h/o cancer, IVDU.  RICE protocol and pain medicine indicated.           Eustace Moore [place], [place] 10/27/11 2105

## 2011-10-27 NOTE — ED Notes (Signed)
Pt. Had surgery o his back x 6 years ago. Has had no problems with back since. Was wraying leaves today and felt some pain in his back. Has got worse.

## 2011-10-27 NOTE — ED Notes (Signed)
Pt reports history of back pain which had resolved. Pt had surgery 4 to 5 years ago. Pt reports that he was working in the yard today racking. Pt reports sudden onset of sharp pulling pain. Pain initially hurt only with movement and now pain is constant even at rest. Left side is worse with radiation to buttocks, but pain present on both sides. No loss of bladder  Or bowel. No Numbness to legs

## 2011-10-28 NOTE — ED Provider Notes (Signed)
Medical screening examination/treatment/procedure(s) were performed by non-physician practitioner and as supervising physician I was immediately available for consultation/collaboration.  Doug Sou, MD 10/28/11 [phone]

## 2011-10-29 ENCOUNTER — Emergency Department (HOSPITAL_COMMUNITY)
Admission: EM | Admit: 2011-10-29 | Discharge: 2011-10-29 | Disposition: A | Discharge status: Home / Self Care | Payer: Self-pay | Attending: Emergency Medicine | Admitting: Emergency Medicine

## 2011-10-29 ENCOUNTER — Encounter (HOSPITAL_COMMUNITY): Payer: Self-pay | Admitting: *Deleted

## 2011-10-29 DIAGNOSIS — M549 Dorsalgia, unspecified: Secondary | ICD-10-CM | POA: Insufficient documentation

## 2011-10-29 DIAGNOSIS — I10 Essential (primary) hypertension: Secondary | ICD-10-CM | POA: Insufficient documentation

## 2011-10-29 DIAGNOSIS — E785 Hyperlipidemia, unspecified: Secondary | ICD-10-CM | POA: Insufficient documentation

## 2011-10-29 DIAGNOSIS — K219 Gastro-esophageal reflux disease without esophagitis: Secondary | ICD-10-CM | POA: Insufficient documentation

## 2011-10-29 DIAGNOSIS — Z79899 Other long term (current) drug therapy: Secondary | ICD-10-CM | POA: Insufficient documentation

## 2011-10-29 MED ORDER — OXYCODONE-ACETAMINOPHEN 5-325 MG PO TABS: 1.0000 | ORAL_TABLET | ORAL | Status: AC | PRN

## 2011-10-29 MED ORDER — HYDROMORPHONE HCL PF 2 MG/ML IJ SOLN
2.0000 mg | Freq: Once | INTRAMUSCULAR | Status: AC
  Administered 2011-10-29: 2 mg via INTRAMUSCULAR
  Filled 2011-10-29: qty 1

## 2011-10-29 NOTE — ED Provider Notes (Signed)
History     CSN: [id number] Arrival date & time: 10/29/2011  8:41 AM   None     Chief Complaint  Patient presents with  . Back Pain    (Consider location/radiation/quality/duration/timing/severity/associated sxs/prior treatment) Patient is a 45 y.o. male presenting with back pain. The history is provided by the patient.  Back Pain  This is a recurrent problem. The current episode started 2 days ago. The problem occurs constantly. The problem has been gradually worsening. Associated with: He reports he was raking leaves 3 days ago with onset recurrent low back pain the next day. He was seen here and discharged with Norco and valium which are not relieving the pain.  The pain is present in the lumbar spine. The quality of the pain is described as stabbing and shooting. The pain radiates to the right thigh (The radiation into right leg is new since previous visit. He also reports one episode of urinary incontinence this a.m. but also that it was during a painful spasm. He has since that time been in control of urinary bladder.). The pain is at a severity of 8/10.    Past Medical History  Diagnosis Date  . Hypertension   . GERD (gastroesophageal reflux disease)   . Hyperlipidemia     Past Surgical History  Procedure Date  . Back surgery   . Neck surgery     Family History  Problem Relation Age of Onset  . Hypertension Mother   . Hypertension Father   . Diabetes Father   . Hypertension Sister   . Hypertension Brother   . Diabetes Brother     History  Substance Use Topics  . Smoking status: Current Everyday Smoker    Last Attempt to Quit: 01/27/2010  . Smokeless tobacco: Not on file  . Alcohol Use: 1.2 oz/week    1 Glasses of wine, 1 Cans of beer per week      Review of Systems  Constitutional: Negative.   Respiratory: Negative.   Cardiovascular: Negative.   Gastrointestinal: Negative.   Musculoskeletal: Positive for back pain.    Allergies  Review of  patient's allergies indicates no known allergies.  Home Medications   Current Outpatient Rx  Name Route Sig Dispense Refill  . ACETAMINOPHEN 500 MG PO TABS Oral Take 1,500 mg by mouth every 6 (six) hours as needed. For pain.     . ATENOLOL-CHLORTHALIDONE 50-25 MG PO TABS Oral Take 1 tablet by mouth daily. 100 tablet 2  . CYCLOBENZAPRINE HCL 10 MG PO TABS  1 pop qhs 50 tablet 1  . DIAZEPAM 5 MG PO TABS  Take 1-2 tablets every 8 hours as needed for muscle spasm 12 tablet 0  . HYDROCODONE-ACETAMINOPHEN 5-325 MG PO TABS  Take 1-2 tablets every 6 hours as needed for severe pain 12 tablet 0  . IBUPROFEN 200 MG PO TABS Oral Take 200 mg by mouth every 6 (six) hours as needed. For pain.     Marland Kitchen LISINOPRIL 40 MG PO TABS Oral Take 1 tablet (40 mg total) by mouth daily. 100 tablet 3  . THERA M PLUS PO TABS Oral Take 1 tablet by mouth daily.      Marland Kitchen OMEPRAZOLE 10 MG PO CPDR Oral Take 10 mg by mouth daily.      Marland Kitchen SIMVASTATIN 40 MG PO TABS  TAKE ONE TABLET BY MOUTH AT BEDTIME 30 tablet 2  . VITAMIN C 500 MG PO TABS Oral Take 500 mg by mouth daily.  BP 181/122  Pulse 92  Temp(Src) 97.8 F (36.6 C) (Oral)  Resp 18  SpO2 100%  Physical Exam  Constitutional: He is oriented to person, place, and time. He appears well-developed and well-nourished.  Neck: Normal range of motion.  Pulmonary/Chest: Effort normal.  Musculoskeletal: Normal range of motion. He exhibits no edema.  Neurological: He is alert and oriented to person, place, and time. He has normal reflexes.       Straight leg raise positive at 30 degrees, left leg.  Skin: Skin is warm and dry.  Psychiatric: He has a normal mood and affect.    ED Course  Procedures (including critical care time)  Labs Reviewed - No data to display No results found.   No diagnosis found.    MDM  He reports his last MRI was 6 months ago with Dr. Phoebe Perch. No surgical intervention planned. Feel, without current neurologic deficit on exam, he can be  discharged to follow up with Dr. Phoebe Perch for further management of persistent back pain.        Rodena Medin, PA 10/29/11 1025  Rodena Medin, PA 10/29/11 1134  Rodena Medin, PA 10/29/11 1337

## 2011-10-29 NOTE — ED Notes (Signed)
Seen here yestereday for same, lower back pain, states the pain is increasing and he is having numbness in his left leg

## 2011-11-02 NOTE — ED Provider Notes (Signed)
Medical screening examination/treatment/procedure(s) were performed by non-physician practitioner and as supervising physician I was immediately available for consultation/collaboration.   Suzi Roots, MD 11/02/11 [phone]

## 2011-11-08 ENCOUNTER — Telehealth: Payer: Self-pay | Admitting: Family Medicine

## 2011-11-08 MED ORDER — ATENOLOL-CHLORTHALIDONE 50-25 MG PO TABS: 1.0000 | ORAL_TABLET | ORAL | Status: DC

## 2011-11-08 MED ORDER — LISINOPRIL 40 MG PO TABS: 40.0000 mg | ORAL_TABLET | ORAL | Status: DC

## 2011-11-08 NOTE — Telephone Encounter (Signed)
Vicodin was not called in because last office visit patient was referred to pain clinic.

## 2011-11-08 NOTE — Telephone Encounter (Signed)
Pt requesting refill on lisinopril (PRINIVIL,ZESTRIL) 20 MG tablet HYDROcodone-acetaminophen (VICODIN ES) 7.5-750 MG per tablet  atenolol-chlorthalidone (TENORETIC) 50-25 MG per tablet [facility] on [address]

## 2011-12-29 ENCOUNTER — Encounter (HOSPITAL_COMMUNITY): Payer: Self-pay | Admitting: Emergency Medicine

## 2011-12-29 ENCOUNTER — Emergency Department (HOSPITAL_COMMUNITY)
Admission: EM | Admit: 2011-12-29 | Discharge: 2011-12-29 | Disposition: A | Discharge status: Home / Self Care | Payer: 59 | Attending: Emergency Medicine | Admitting: Emergency Medicine

## 2011-12-29 DIAGNOSIS — J020 Streptococcal pharyngitis: Secondary | ICD-10-CM

## 2011-12-29 DIAGNOSIS — E785 Hyperlipidemia, unspecified: Secondary | ICD-10-CM | POA: Insufficient documentation

## 2011-12-29 DIAGNOSIS — R509 Fever, unspecified: Secondary | ICD-10-CM | POA: Insufficient documentation

## 2011-12-29 DIAGNOSIS — K219 Gastro-esophageal reflux disease without esophagitis: Secondary | ICD-10-CM | POA: Insufficient documentation

## 2011-12-29 DIAGNOSIS — I10 Essential (primary) hypertension: Secondary | ICD-10-CM | POA: Insufficient documentation

## 2011-12-29 LAB — RAPID STREP SCREEN (MED CTR MEBANE ONLY): Streptococcus, Group A Screen (Direct): POSITIVE — AB

## 2011-12-29 MED ORDER — PENICILLIN V POTASSIUM 500 MG PO TABS
500.0000 mg | ORAL_TABLET | Freq: Once | ORAL | Status: AC
  Administered 2011-12-29: 500 mg via ORAL
  Filled 2011-12-29: qty 1

## 2011-12-29 MED ORDER — DEXAMETHASONE SODIUM PHOSPHATE 10 MG/ML IJ SOLN
10.0000 mg | Freq: Once | INTRAMUSCULAR | Status: AC
  Administered 2011-12-29: 10 mg via INTRAMUSCULAR
  Filled 2011-12-29: qty 1

## 2011-12-29 MED ORDER — PENICILLIN V POTASSIUM 500 MG PO TABS: 500.0000 mg | ORAL_TABLET | Freq: Two times a day (BID) | ORAL | Status: AC

## 2011-12-29 MED ORDER — KETOROLAC TROMETHAMINE 60 MG/2ML IM SOLN
60.0000 mg | Freq: Once | INTRAMUSCULAR | Status: AC
  Administered 2011-12-29: 60 mg via INTRAMUSCULAR
  Filled 2011-12-29: qty 2

## 2011-12-29 NOTE — ED Provider Notes (Signed)
History     CSN: [id number]  Arrival date & time 12/29/11  1446   First MD Initiated Contact with Patient 12/29/11 1550      Chief Complaint  Patient presents with  . Influenza  . Sore Throat  . Fever    (Consider location/radiation/quality/duration/timing/severity/associated sxs/prior treatment) HPI Comments: Patient presented complaining of 2 days of sore throat.  He's had decreased by mouth intake related to his sore throat because it hurts to swallow.  Patient does have the ability to swallow and is controlling his secretions.  No changes in his voice.  No specific fevers that he's aware of.  No nausea or vomiting.  Patient denies significant cough, shortness of breath or URI symptoms.  No specific sick contacts.  Patient is a 46 y.o. male presenting with flu symptoms, pharyngitis, and fever. The history is provided by the patient. No language interpreter was used.  Influenza This is a new problem. The current episode started 2 days ago. The problem occurs constantly. The problem has been gradually worsening. Pertinent negatives include no chest pain, no abdominal pain, no headaches and no shortness of breath. The symptoms are aggravated by swallowing. The symptoms are relieved by nothing. He has tried nothing for the symptoms.  Sore Throat Pertinent negatives include no chest pain, no abdominal pain, no headaches and no shortness of breath.  Fever Primary symptoms do not include fever, headaches, cough, shortness of breath, abdominal pain, nausea, vomiting or rash.    Past Medical History  Diagnosis Date  . Hypertension   . GERD (gastroesophageal reflux disease)   . Hyperlipidemia     Past Surgical History  Procedure Date  . Back surgery   . Neck surgery     Family History  Problem Relation Age of Onset  . Hypertension Mother   . Hypertension Father   . Diabetes Father   . Hypertension Sister   . Hypertension Brother   . Diabetes Brother     History  Substance  Use Topics  . Smoking status: Current Everyday Smoker    Last Attempt to Quit: 01/27/2010  . Smokeless tobacco: Not on file  . Alcohol Use: 1.2 oz/week    1 Glasses of wine, 1 Cans of beer per week      Review of Systems  Constitutional: Negative for fever and chills.  HENT: Positive for sore throat and trouble swallowing.   Eyes: Negative.  Negative for discharge and redness.  Respiratory: Negative.  Negative for cough and shortness of breath.   Cardiovascular: Negative.  Negative for chest pain.  Gastrointestinal: Negative.  Negative for nausea, vomiting and abdominal pain.  Genitourinary: Negative.  Negative for hematuria.  Musculoskeletal: Negative.  Negative for back pain.  Skin: Negative.  Negative for color change and rash.  Neurological: Negative for syncope and headaches.  Hematological: Negative.  Negative for adenopathy.  Psychiatric/Behavioral: Negative.  Negative for confusion.  All other systems reviewed and are negative.    Allergies  Review of patient's allergies indicates no known allergies.  Home Medications   Current Outpatient Rx  Name Route Sig Dispense Refill  . ACETAMINOPHEN 500 MG PO TABS Oral Take 1,000 mg by mouth every 6 (six) hours as needed. For pain.    . ATENOLOL-CHLORTHALIDONE 50-25 MG PO TABS Oral Take 1 tablet by mouth every morning. 90 tablet 0  . CYCLOBENZAPRINE HCL 10 MG PO TABS Oral Take 10 mg by mouth 3 (three) times daily as needed. For muscle spasms.     Marland Kitchen  HYDROCODONE-ACETAMINOPHEN 5-325 MG PO TABS Oral Take by mouth every 6 (six) hours as needed. 1-2 tablets. For severe pain.     Marland Kitchen LISINOPRIL 40 MG PO TABS Oral Take 1 tablet (40 mg total) by mouth every morning. 90 tablet 0  . THERA M PLUS PO TABS Oral Take 1 tablet by mouth every morning.     Marland Kitchen OMEPRAZOLE 10 MG PO CPDR Oral Take 20 mg by mouth daily.     . NYQUIL PO Oral Take 15 mLs by mouth at bedtime.    Marland Kitchen SIMVASTATIN 40 MG PO TABS      . VITAMIN C 500 MG PO TABS Oral Take 500 mg  by mouth every morning.       There were no vitals taken for this visit.  Physical Exam  Constitutional: He is oriented to person, place, and time. He appears well-developed and well-nourished.  Non-toxic appearance. He does not have a sickly appearance.  HENT:  Head: Normocephalic and atraumatic.       Patient has bilateral peritonsillar swelling uvula is midline though.  There are exudates present.  Tonsils are swollen as well.  With exudates present  Eyes: Conjunctivae, EOM and lids are normal. Pupils are equal, round, and reactive to light.  Neck: Trachea normal, normal range of motion and full passive range of motion without pain. Neck supple.       Bilateral anterior cervical adenopathy  Cardiovascular: Normal rate, regular rhythm and normal heart sounds.   Pulmonary/Chest: Effort normal and breath sounds normal. No respiratory distress. He has no wheezes. He has no rales.  Abdominal: Soft. Normal appearance. He exhibits no distension. There is no tenderness. There is no rebound and no CVA tenderness.  Musculoskeletal: Normal range of motion.  Lymphadenopathy:    He has cervical adenopathy.  Neurological: He is alert and oriented to person, place, and time. He has normal strength.  Skin: Skin is warm, dry and intact. No rash noted.  Psychiatric: He has a normal mood and affect. His behavior is normal. Judgment and thought content normal.    ED Course  Procedures (including critical care time)  Results for orders placed during the hospital encounter of 12/29/11  RAPID STREP SCREEN      Component Value Range   Streptococcus, Group A Screen (Direct) POSITIVE (*) NEGATIVE        MDM  She presents with symptoms consistent with pharyngitis.  He has not been having any respiratory distress or airway compromise.  He is strep screen positive and so I will treat him with penicillin at home.  He's been given steroids here to assist with the swelling and Toradol for pain and fever  control as well.  Patient encouraged to continue fluid intake and if he worsens that he should return to the emergency department for further evaluation.        Nat Christen, MD 12/29/11 1758

## 2012-01-12 ENCOUNTER — Other Ambulatory Visit: Payer: 59

## 2012-01-18 ENCOUNTER — Encounter: Payer: 59 | Admitting: Family Medicine

## 2012-03-07 ENCOUNTER — Other Ambulatory Visit: Payer: Self-pay | Admitting: Neurosurgery

## 2012-04-05 ENCOUNTER — Encounter (HOSPITAL_COMMUNITY): Payer: Self-pay | Admitting: Pharmacy Technician

## 2012-04-11 ENCOUNTER — Inpatient Hospital Stay (HOSPITAL_COMMUNITY): Admission: RE | Admit: 2012-04-11 | Discharge: 2012-04-11 | Payer: 59 | Source: Ambulatory Visit

## 2012-04-11 NOTE — Pre-Procedure Instructions (Signed)
20 Mike Shepard  04/11/2012   Your procedure is scheduled on:  04/18/12  Report to [facility] at 530 AM.  Call this number if you have problems the morning of surgery: [phone]   Remember:   Do not eat food:After Midnight.  May have clear liquids: up to 4 Hours before arrival.  Clear liquids include soda, tea, black coffee, apple or grape juice, broth.  Take these medicines the morning of surgery with A SIP OF WATER: atenolol,hydrocodone,prilosec,tylenol   Do not wear jewelry, make-up or nail polish.  Do not wear lotions, powders, or perfumes. You may wear deodorant.  Do not shave 48 hours prior to surgery.  Do not bring valuables to the hospital.  Contacts, dentures or bridgework may not be worn into surgery.  Leave suitcase in the car. After surgery it may be brought to your room.  For patients admitted to the hospital, checkout time is 11:00 AM the day of discharge.   Patients discharged the day of surgery will not be allowed to drive home.  Name and phone number of your driver: family  Special Instructions: CHG Shower Use Special Wash: 1/2 bottle night before surgery and 1/2 bottle morning of surgery.   Please read over the following fact sheets that you were given: Pain Booklet, Coughing and Deep Breathing, MRSA Information and Surgical Site Infection Prevention

## 2012-04-16 ENCOUNTER — Encounter (HOSPITAL_COMMUNITY)
Admission: RE | Admit: 2012-04-16 | Discharge: 2012-04-16 | Disposition: A | Discharge status: Home / Self Care | Payer: 59 | Source: Ambulatory Visit | Attending: Neurosurgery | Admitting: Neurosurgery

## 2012-04-16 ENCOUNTER — Encounter (HOSPITAL_COMMUNITY): Payer: Self-pay

## 2012-04-16 ENCOUNTER — Other Ambulatory Visit: Payer: Self-pay | Admitting: Family Medicine

## 2012-04-16 ENCOUNTER — Encounter (HOSPITAL_COMMUNITY)
Admission: RE | Admit: 2012-04-16 | Discharge: 2012-04-16 | Disposition: A | Discharge status: Home / Self Care | Payer: 59 | Source: Ambulatory Visit | Attending: Anesthesiology | Admitting: Anesthesiology

## 2012-04-16 LAB — CBC
HCT: 36.4 % — ABNORMAL LOW (ref 39.0–52.0)
Hemoglobin: 12.8 g/dL — ABNORMAL LOW (ref 13.0–17.0)
MCH: 30.9 pg (ref 26.0–34.0)
MCHC: 35.2 g/dL (ref 30.0–36.0)
MCV: 87.9 fL (ref 78.0–100.0)
Platelets: 210 10*3/uL (ref 150–400)
RBC: 4.14 MIL/uL — ABNORMAL LOW (ref 4.22–5.81)
RDW: 13.1 % (ref 11.5–15.5)
WBC: 7.5 10*3/uL (ref 4.0–10.5)

## 2012-04-16 LAB — BASIC METABOLIC PANEL
BUN: 13 mg/dL (ref 6–23)
CO2: 26 mEq/L (ref 19–32)
Calcium: 9.3 mg/dL (ref 8.4–10.5)
Chloride: 110 mEq/L (ref 96–112)
Creatinine, Ser: 1.18 mg/dL (ref 0.50–1.35)
GFR calc Af Amer: 84 mL/min — ABNORMAL LOW (ref >90)
GFR calc non Af Amer: 72 mL/min — ABNORMAL LOW (ref >90)
Glucose, Bld: 92 mg/dL (ref 70–99)
Potassium: 4.1 mEq/L (ref 3.5–5.1)
Sodium: 143 mEq/L (ref 135–145)

## 2012-04-16 LAB — SURGICAL PCR SCREEN
MRSA, PCR: NEGATIVE
Staphylococcus aureus: NEGATIVE

## 2012-04-16 NOTE — Pre-Procedure Instructions (Signed)
20 TABB CROGHAN  04/16/2012   Your procedure is scheduled on:  04/18/12  Thursday.    Report to [facility] at 0530 AM.  Call this number if you have problems the morning of surgery: [phone]   Remember:   Do not eat food:After Midnight.  May have clear liquids: up to 4 Hours before arrival.0130 AM  NO LIQUIDS AFTER 0130 AM THE DAY OF SURGERY ..  Clear liquids include soda, tea, black coffee, apple or grape juice, broth.  Take these medicines the morning of surgery with A SIP OF WATER: ATENOLOL  NORCO  PRILOSEC   Do not wear jewelry, make-up or nail polish.  Do not wear lotions, powders, or perfumes. You may wear deodorant.  Do not shave 48 hours prior to surgery.  Do not bring valuables to the hospital.  Contacts, dentures or bridgework may not be worn into surgery.  Leave suitcase in the car. After surgery it may be brought to your room.  For patients admitted to the hospital, checkout time is 11:00 AM the day of discharge.   Patients discharged the day of surgery will not be allowed to drive home.  Name and phone number of your driver: XAVION MUSCAT- WIFE [phone]  Special Instructions: CHG Shower Use Special Wash: 1/2 bottle night before surgery and 1/2 bottle morning of surgery.   Please read over the following fact sheets that you were given: Pain Booklet, Coughing and Deep Breathing, MRSA Information and Surgical Site Infection Prevention

## 2012-04-17 MED ORDER — CEFAZOLIN SODIUM-DEXTROSE 2-3 GM-% IV SOLR
2.0000 g | INTRAVENOUS | Status: AC
  Administered 2012-04-18: 2 g via INTRAVENOUS
  Filled 2012-04-17: qty 50

## 2012-04-18 ENCOUNTER — Encounter (HOSPITAL_COMMUNITY): Payer: Self-pay | Admitting: Certified Registered"

## 2012-04-18 ENCOUNTER — Ambulatory Visit (HOSPITAL_COMMUNITY): Payer: 59 | Admitting: Certified Registered"

## 2012-04-18 ENCOUNTER — Encounter (HOSPITAL_COMMUNITY)
Admission: RE | Disposition: A | Discharge status: Home / Self Care | Payer: Self-pay | Source: Ambulatory Visit | Attending: Neurosurgery

## 2012-04-18 ENCOUNTER — Ambulatory Visit (HOSPITAL_COMMUNITY): Payer: 59

## 2012-04-18 ENCOUNTER — Ambulatory Visit (HOSPITAL_COMMUNITY)
Admission: RE | Admit: 2012-04-18 | Discharge: 2012-04-18 | Disposition: A | Discharge status: Home / Self Care | Payer: 59 | Source: Ambulatory Visit | Attending: Neurosurgery | Admitting: Neurosurgery

## 2012-04-18 DIAGNOSIS — Z01812 Encounter for preprocedural laboratory examination: Secondary | ICD-10-CM | POA: Insufficient documentation

## 2012-04-18 DIAGNOSIS — I10 Essential (primary) hypertension: Secondary | ICD-10-CM | POA: Insufficient documentation

## 2012-04-18 DIAGNOSIS — M47817 Spondylosis without myelopathy or radiculopathy, lumbosacral region: Secondary | ICD-10-CM | POA: Insufficient documentation

## 2012-04-18 DIAGNOSIS — F17200 Nicotine dependence, unspecified, uncomplicated: Secondary | ICD-10-CM | POA: Insufficient documentation

## 2012-04-18 DIAGNOSIS — M5126 Other intervertebral disc displacement, lumbar region: Secondary | ICD-10-CM | POA: Insufficient documentation

## 2012-04-18 SURGERY — LUMBAR LAMINECTOMY/ DECOMPRESSION WITH MET-RX
Anesthesia: General | Site: Back | Laterality: Left | Wound class: Clean

## 2012-04-18 MED ORDER — DROPERIDOL 2.5 MG/ML IJ SOLN: 0.6250 mg | INTRAMUSCULAR | Status: DC | PRN

## 2012-04-18 MED ORDER — LIDOCAINE HCL (CARDIAC) 20 MG/ML IV SOLN
INTRAVENOUS | Status: DC | PRN
  Administered 2012-04-18: 100 mg via INTRAVENOUS

## 2012-04-18 MED ORDER — HYDROMORPHONE HCL PF 1 MG/ML IJ SOLN
INTRAMUSCULAR | Status: AC
  Filled 2012-04-18: qty 1

## 2012-04-18 MED ORDER — 0.9 % SODIUM CHLORIDE (POUR BTL) OPTIME
TOPICAL | Status: DC | PRN
  Administered 2012-04-18: 1000 mL

## 2012-04-18 MED ORDER — LACTATED RINGERS IV SOLN
INTRAVENOUS | Status: DC | PRN
  Administered 2012-04-18 (×2): via INTRAVENOUS

## 2012-04-18 MED ORDER — ACETAMINOPHEN 650 MG RE SUPP: 650.0000 mg | RECTAL | Status: DC | PRN

## 2012-04-18 MED ORDER — HEMOSTATIC AGENTS (NO CHARGE) OPTIME
TOPICAL | Status: DC | PRN
  Administered 2012-04-18: 1 via TOPICAL

## 2012-04-18 MED ORDER — METHYLPREDNISOLONE ACETATE 80 MG/ML IJ SUSP
INTRAMUSCULAR | Status: DC | PRN
  Administered 2012-04-18: 80 mg

## 2012-04-18 MED ORDER — MENTHOL 3 MG MT LOZG: 1.0000 | LOZENGE | OROMUCOSAL | Status: DC | PRN

## 2012-04-18 MED ORDER — THERA M PLUS PO TABS: 1.0000 | ORAL_TABLET | ORAL | Status: DC

## 2012-04-18 MED ORDER — MORPHINE SULFATE 2 MG/ML IJ SOLN
INTRAMUSCULAR | Status: AC
  Administered 2012-04-18: 2 mg via INTRAVENOUS
  Filled 2012-04-18: qty 1

## 2012-04-18 MED ORDER — FENTANYL CITRATE 0.05 MG/ML IJ SOLN
INTRAMUSCULAR | Status: DC | PRN
  Administered 2012-04-18: 100 ug via INTRAVENOUS

## 2012-04-18 MED ORDER — SODIUM CHLORIDE 0.9 % IR SOLN
Status: DC | PRN
  Administered 2012-04-18: 09:00:00

## 2012-04-18 MED ORDER — FENTANYL CITRATE 0.05 MG/ML IJ SOLN
INTRAMUSCULAR | Status: AC
  Filled 2012-04-18: qty 2

## 2012-04-18 MED ORDER — PROPOFOL 10 MG/ML IV BOLUS
INTRAVENOUS | Status: DC | PRN
  Administered 2012-04-18: 150 mg via INTRAVENOUS

## 2012-04-18 MED ORDER — KETOROLAC TROMETHAMINE 30 MG/ML IJ SOLN
INTRAMUSCULAR | Status: DC | PRN
  Administered 2012-04-18: 30 mg via INTRAVENOUS

## 2012-04-18 MED ORDER — PANTOPRAZOLE SODIUM 40 MG PO TBEC: 40.0000 mg | DELAYED_RELEASE_TABLET | Freq: Every day | ORAL | Status: DC

## 2012-04-18 MED ORDER — KCL IN DEXTROSE-NACL 20-5-0.45 MEQ/L-%-% IV SOLN
INTRAVENOUS | Status: DC
  Filled 2012-04-18 (×3): qty 1000

## 2012-04-18 MED ORDER — SODIUM CHLORIDE 0.9 % IV SOLN
INTRAVENOUS | Status: AC
  Filled 2012-04-18: qty 500

## 2012-04-18 MED ORDER — PHENOL 1.4 % MT LIQD: 1.0000 | OROMUCOSAL | Status: DC | PRN

## 2012-04-18 MED ORDER — HYDROMORPHONE HCL PF 1 MG/ML IJ SOLN
0.2500 mg | INTRAMUSCULAR | Status: DC | PRN
  Administered 2012-04-18 (×3): 0.5 mg via INTRAVENOUS

## 2012-04-18 MED ORDER — LIDOCAINE HCL 4 % MT SOLN
OROMUCOSAL | Status: DC | PRN
  Administered 2012-04-18: 4 mL via TOPICAL

## 2012-04-18 MED ORDER — ZOLPIDEM TARTRATE 5 MG PO TABS: 10.0000 mg | ORAL_TABLET | Freq: Every evening | ORAL | Status: DC | PRN

## 2012-04-18 MED ORDER — DIAZEPAM 5 MG PO TABS
5.0000 mg | ORAL_TABLET | Freq: Four times a day (QID) | ORAL | Status: DC | PRN
  Administered 2012-04-18: 5 mg via ORAL
  Filled 2012-04-18: qty 1

## 2012-04-18 MED ORDER — SODIUM CHLORIDE 0.9 % IV SOLN: 250.0000 mL | INTRAVENOUS | Status: DC

## 2012-04-18 MED ORDER — LIDOCAINE-EPINEPHRINE 1 %-1:100000 IJ SOLN
INTRAMUSCULAR | Status: DC | PRN
  Administered 2012-04-18: 5 mL

## 2012-04-18 MED ORDER — HYDROCODONE-ACETAMINOPHEN 5-325 MG PO TABS: 1.0000 | ORAL_TABLET | ORAL | Status: DC | PRN

## 2012-04-18 MED ORDER — LISINOPRIL 40 MG PO TABS
40.0000 mg | ORAL_TABLET | Freq: Every day | ORAL | Status: DC
  Filled 2012-04-18: qty 1

## 2012-04-18 MED ORDER — THROMBIN 5000 UNITS EX KIT
PACK | CUTANEOUS | Status: DC | PRN
  Administered 2012-04-18 (×2): 5000 [IU] via TOPICAL

## 2012-04-18 MED ORDER — ACETAMINOPHEN 325 MG PO TABS: 650.0000 mg | ORAL_TABLET | ORAL | Status: DC | PRN

## 2012-04-18 MED ORDER — CEFAZOLIN SODIUM 1-5 GM-% IV SOLN
1.0000 g | Freq: Three times a day (TID) | INTRAVENOUS | Status: DC
  Administered 2012-04-18: 1 g via INTRAVENOUS
  Filled 2012-04-18 (×2): qty 50

## 2012-04-18 MED ORDER — ADULT MULTIVITAMIN W/MINERALS CH
1.0000 | ORAL_TABLET | Freq: Every day | ORAL | Status: DC
  Filled 2012-04-18: qty 1

## 2012-04-18 MED ORDER — SODIUM CHLORIDE 0.9 % IJ SOLN: 3.0000 mL | Freq: Two times a day (BID) | INTRAMUSCULAR | Status: DC

## 2012-04-18 MED ORDER — SODIUM CHLORIDE 0.9 % IJ SOLN: 3.0000 mL | INTRAMUSCULAR | Status: DC | PRN

## 2012-04-18 MED ORDER — MORPHINE SULFATE 4 MG/ML IJ SOLN: 1.0000 mg | INTRAMUSCULAR | Status: DC | PRN

## 2012-04-18 MED ORDER — ATENOLOL 50 MG PO TABS: 50.0000 mg | ORAL_TABLET | Freq: Every day | ORAL | Status: DC

## 2012-04-18 MED ORDER — BUPIVACAINE HCL (PF) 0.5 % IJ SOLN
INTRAMUSCULAR | Status: DC | PRN
  Administered 2012-04-18: 5 mL

## 2012-04-18 MED ORDER — OXYCODONE-ACETAMINOPHEN 5-325 MG PO TABS
1.0000 | ORAL_TABLET | ORAL | Status: DC | PRN
  Administered 2012-04-18 (×2): 2 via ORAL
  Filled 2012-04-18 (×2): qty 2

## 2012-04-18 MED ORDER — VECURONIUM BROMIDE 10 MG IV SOLR
INTRAVENOUS | Status: DC | PRN
  Administered 2012-04-18: 7 mg via INTRAVENOUS

## 2012-04-18 MED ORDER — ACETAMINOPHEN 500 MG PO TABS: 1000.0000 mg | ORAL_TABLET | Freq: Four times a day (QID) | ORAL | Status: DC | PRN

## 2012-04-18 MED ORDER — MIDAZOLAM HCL 5 MG/5ML IJ SOLN
INTRAMUSCULAR | Status: DC | PRN
  Administered 2012-04-18: 2 mg via INTRAVENOUS

## 2012-04-18 MED ORDER — DOCUSATE SODIUM 100 MG PO CAPS
100.0000 mg | ORAL_CAPSULE | Freq: Two times a day (BID) | ORAL | Status: DC
  Administered 2012-04-18: 100 mg via ORAL
  Filled 2012-04-18: qty 1

## 2012-04-18 MED ORDER — ONDANSETRON HCL 4 MG/2ML IJ SOLN
INTRAMUSCULAR | Status: DC | PRN
  Administered 2012-04-18: 4 mg via INTRAVENOUS

## 2012-04-18 MED ORDER — ONDANSETRON HCL 4 MG/2ML IJ SOLN: 4.0000 mg | INTRAMUSCULAR | Status: DC | PRN

## 2012-04-18 MED ORDER — BACITRACIN 50000 UNITS IM SOLR
INTRAMUSCULAR | Status: AC
  Filled 2012-04-18: qty 1

## 2012-04-18 MED ORDER — EPHEDRINE SULFATE 50 MG/ML IJ SOLN
INTRAMUSCULAR | Status: DC | PRN
  Administered 2012-04-18 (×2): 10 mg via INTRAVENOUS

## 2012-04-18 MED ORDER — SUFENTANIL CITRATE 50 MCG/ML IV SOLN
INTRAVENOUS | Status: DC | PRN
  Administered 2012-04-18 (×2): 10 ug via INTRAVENOUS
  Administered 2012-04-18: 20 ug via INTRAVENOUS

## 2012-04-18 SURGICAL SUPPLY — 60 items
ADH SKN CLS APL DERMABOND .7 (GAUZE/BANDAGES/DRESSINGS) ×1
BAG DECANTER FOR FLEXI CONT (MISCELLANEOUS) ×2 IMPLANT
BLADE SURG 15 STRL LF DISP TIS (BLADE) ×1 IMPLANT
BLADE SURG 15 STRL SS (BLADE) ×2
BLADE SURG ROTATE 9660 (MISCELLANEOUS) ×1 IMPLANT
BUR MATCHSTICK NEURO 3.0 LAGG (BURR) ×1 IMPLANT
CANISTER SUCTION 2500CC (MISCELLANEOUS) ×2 IMPLANT
CLOTH BEACON ORANGE TIMEOUT ST (SAFETY) ×2 IMPLANT
CONT SPEC 4OZ CLIKSEAL STRL BL (MISCELLANEOUS) ×2 IMPLANT
DECANTER SPIKE VIAL GLASS SM (MISCELLANEOUS) ×1 IMPLANT
DERMABOND ADVANCED (GAUZE/BANDAGES/DRESSINGS) ×1
DERMABOND ADVANCED .7 DNX12 (GAUZE/BANDAGES/DRESSINGS) ×1 IMPLANT
DRAPE C-ARM 42X72 X-RAY (DRAPES) ×4 IMPLANT
DRAPE LAPAROTOMY 100X72 PEDS (DRAPES) ×2 IMPLANT
DRAPE MICROSCOPE LEICA (MISCELLANEOUS) ×2 IMPLANT
DRAPE POUCH INSTRU U-SHP 10X18 (DRAPES) ×2 IMPLANT
DURAPREP 6ML APPLICATOR 50/CS (WOUND CARE) ×1 IMPLANT
ELECT BLADE 6.5 EXT (BLADE) ×2 IMPLANT
ELECT REM PT RETURN 9FT ADLT (ELECTROSURGICAL) ×2
ELECTRODE REM PT RTRN 9FT ADLT (ELECTROSURGICAL) ×1 IMPLANT
GAUZE SPONGE 4X4 16PLY XRAY LF (GAUZE/BANDAGES/DRESSINGS) IMPLANT
GLOVE BIO SURGEON STRL SZ8 (GLOVE) ×3 IMPLANT
GLOVE BIOGEL PI IND STRL 7.0 (GLOVE) IMPLANT
GLOVE BIOGEL PI IND STRL 8 (GLOVE) IMPLANT
GLOVE BIOGEL PI IND STRL 8.5 (GLOVE) ×1 IMPLANT
GLOVE BIOGEL PI INDICATOR 7.0 (GLOVE) ×1
GLOVE BIOGEL PI INDICATOR 8 (GLOVE) ×2
GLOVE BIOGEL PI INDICATOR 8.5 (GLOVE) ×1
GLOVE ECLIPSE 7.5 STRL STRAW (GLOVE) ×2 IMPLANT
GLOVE ECLIPSE 8.0 STRL XLNG CF (GLOVE) ×1 IMPLANT
GLOVE EXAM NITRILE LRG STRL (GLOVE) IMPLANT
GLOVE EXAM NITRILE MD LF STRL (GLOVE) IMPLANT
GLOVE EXAM NITRILE XL STR (GLOVE) IMPLANT
GLOVE EXAM NITRILE XS STR PU (GLOVE) IMPLANT
GLOVE SURG SS PI 7.0 STRL IVOR (GLOVE) ×2 IMPLANT
GOWN BRE IMP SLV AUR LG STRL (GOWN DISPOSABLE) ×1 IMPLANT
GOWN BRE IMP SLV AUR XL STRL (GOWN DISPOSABLE) ×2 IMPLANT
GOWN STRL REIN 2XL LVL4 (GOWN DISPOSABLE) ×3 IMPLANT
KIT BASIN OR (CUSTOM PROCEDURE TRAY) ×2 IMPLANT
KIT ROOM TURNOVER OR (KITS) ×2 IMPLANT
NDL HYPO 18GX1.5 BLUNT FILL (NEEDLE) IMPLANT
NDL SPNL 20GX3.5 QUINCKE YW (NEEDLE) IMPLANT
NEEDLE HYPO 18GX1.5 BLUNT FILL (NEEDLE) ×2 IMPLANT
NEEDLE HYPO 22GX1.5 SAFETY (NEEDLE) ×1 IMPLANT
NEEDLE SPNL 20GX3.5 QUINCKE YW (NEEDLE) ×2 IMPLANT
NS IRRIG 1000ML POUR BTL (IV SOLUTION) ×2 IMPLANT
PACK LAMINECTOMY NEURO (CUSTOM PROCEDURE TRAY) ×2 IMPLANT
PAD ARMBOARD 7.5X6 YLW CONV (MISCELLANEOUS) ×6 IMPLANT
RUBBERBAND STERILE (MISCELLANEOUS) ×4 IMPLANT
SPONGE SURGIFOAM ABS GEL SZ50 (HEMOSTASIS) ×2 IMPLANT
SUT VIC AB 0 CT1 18XCR BRD8 (SUTURE) ×1 IMPLANT
SUT VIC AB 0 CT1 8-18 (SUTURE) ×2
SUT VIC AB 2-0 CT1 18 (SUTURE) ×2 IMPLANT
SUT VIC AB 3-0 SH 8-18 (SUTURE) ×2 IMPLANT
SYR 20ML ECCENTRIC (SYRINGE) ×2 IMPLANT
SYR 5ML LL (SYRINGE) ×1 IMPLANT
TOWEL OR 17X24 6PK STRL BLUE (TOWEL DISPOSABLE) ×2 IMPLANT
TOWEL OR 17X26 10 PK STRL BLUE (TOWEL DISPOSABLE) ×2 IMPLANT
WATER STERILE IRR 1000ML POUR (IV SOLUTION) ×2 IMPLANT
WIRE TIP MIS 2.5MM NEURO (BURR) ×1 IMPLANT

## 2012-04-18 NOTE — Anesthesia Preprocedure Evaluation (Signed)
Anesthesia Evaluation  Patient identified by MRN, date of birth, ID band Patient awake    Reviewed: Allergy & Precautions, H&P , NPO status , Patient's Chart, lab work & pertinent test results  History of Anesthesia Complications Negative for: history of anesthetic complications  Airway Mallampati: II TM Distance: >3 FB Neck ROM: Full    Dental  (+) Edentulous Upper and Dental Advisory Given   Pulmonary Current Smoker,  breath sounds clear to auscultation  Pulmonary exam normal       Cardiovascular hypertension, Pt. on medications Rhythm:Regular Rate:Normal     Neuro/Psych Depression    GI/Hepatic Neg liver ROS, GERD-  Medicated and Controlled,  Endo/Other  negative endocrine ROS  Renal/GU negative Renal ROS     Musculoskeletal   Abdominal   Peds  Hematology   Anesthesia Other Findings   Reproductive/Obstetrics                           Anesthesia Physical Anesthesia Plan  ASA: II  Anesthesia Plan: General   Post-op Pain Management:    Induction: Intravenous  Airway Management Planned: Oral ETT  Additional Equipment:   Intra-op Plan:   Post-operative Plan: Extubation in OR  Informed Consent: I have reviewed the patients History and Physical, chart, labs and discussed the procedure including the risks, benefits and alternatives for the proposed anesthesia with the patient or authorized representative who has indicated his/her understanding and acceptance.   Dental advisory given  Plan Discussed with: CRNA and Surgeon  Anesthesia Plan Comments:         Anesthesia Quick Evaluation

## 2012-04-18 NOTE — Progress Notes (Signed)
Pt given D/C instructions with Rx's. Pt verbalized understanding of D/C instructions. Pt D/C'd home via wheelchair per MD order. Rema Fendt, RN

## 2012-04-18 NOTE — Anesthesia Postprocedure Evaluation (Signed)
Anesthesia Post Note  Patient: Mike Shepard  Procedure(s) Performed: Procedure(s) (LRB): LUMBAR LAMINECTOMY/ DECOMPRESSION WITH MET-RX (Left)  Anesthesia type: general  Patient location: PACU  Post pain: Pain level controlled  Post assessment: Patient's Cardiovascular Status Stable  Last Vitals:  Filed Vitals:   04/18/12 1045  BP:   Pulse:   Temp: 36.1 C  Resp:     Post vital signs: Reviewed and stable  Level of consciousness: sedated  Complications: No apparent anesthesia complications

## 2012-04-18 NOTE — Interval H&P Note (Signed)
History and Physical Interval Note:  04/18/2012 7:35 AM  Mike Shepard  has presented today for surgery, with the diagnosis of lumbar hernaited disc lumbar spondylosis lumbar radiculopathy  The various methods of treatment have been discussed with the patient and family. After consideration of risks, benefits and other options for treatment, the patient has consented to  Procedure(s) (LRB): LUMBAR LAMINECTOMY/ DECOMPRESSION WITH MET-RX (Left) as a surgical intervention .  The patients' history has been reviewed, patient examined, no change in status, stable for surgery.  I have reviewed the patients' chart and labs.  Questions were answered to the patient's satisfaction.     Tsion Inghram D  Date of Initial H&P: 04/18/2012  History reviewed, patient examined, no change in status, stable for surgery.

## 2012-04-18 NOTE — Op Note (Signed)
04/18/2012  10:03 AM  PATIENT:  Mike Shepard  46 y.o. male  PRE-OPERATIVE DIAGNOSIS: Far lateral left Lumbar three-four herniated nucleus pulposus, spondylosis and radiculopathy  POST-OPERATIVE DIAGNOSIS:   Far lateral left Lumbar three-four herniated nucleus pulposus, spondylosis and radiculopathy  PROCEDURE:  Procedure(s) (LRB): Far Lateral Left L 3/4 LUMBAR LAMINECTOMY/ DECOMPRESSION WITH MET-RX (Left)  SURGEON:  Surgeon(s) and Role:    * Maeola Harman, MD - Primary    * Tia Alert, MD - Assisting  PHYSICIAN ASSISTANT:   ASSISTANTS: Poteat, RN   ANESTHESIA:   general  EBL:  Total I/O In: 1000 [I.V.:1000] Out: -   BLOOD ADMINISTERED:none  DRAINS: none   LOCAL MEDICATIONS USED:  LIDOCAINE   SPECIMEN:  No Specimen  DISPOSITION OF SPECIMEN:  N/A  COUNTS:  YES  TOURNIQUET:  * No tourniquets in log *  DICTATION: DICTATION: Patient has left L 3 radiculopathy with extra-foraminal disc herniation L 3 - 4 left, who has not improved with conservative management.  PROCEDURE: Patient was brought to the operating room and following smooth and uncomplicated induction of general endotracheal anesthesia, patient was placed in prone position on Wilson frame.  Back was prepped and draped in the usual fashion with Duraprep.  Using C -arm fluoroscopy, the Left L3-4 level was localized.  Skin and subcutaneous tissues were infiltrated with lidocaine.  An incision was made and using sequential dilators and the expandable retractor, the Left L 3 -4 pars and interspace were exposed.  Using microscope, the left L 3 pars was thinned with the high speed drill and then the lateral aspect was removed with Kerrison rongeurs.  The intertransverse ligament was removed and the lateral aspect of the L 3 nerve was identified.  The heaped up annulus of the disc space was identified and incised and a thorough discectomy was performed with removal of multiple subligamentous compressive fragments of disc  material.  The interspace was then irrigated.  The course of the nerve root was then palpated and there did not appear to be any additional compression.  The interspace was bathed in Depo-Medrol and fentanyl. The retractor was removed after hemostasis was assured and the fascia was closed with 0 vicryl sutures, the subcutaneous tissues with 2-0 vicryl sutures and the skin edges with 3-0 vicryl sutures.  The wound was dressed with Dermabond.  Patient was extubated and taken to Recovery in stable and satisfactory condition.  Counts were correct at the end of the case.   PLAN OF CARE: Admit for overnight observation  PATIENT DISPOSITION:  PACU - hemodynamically stable.   Delay start of Pharmacological VTE agent (>24hrs) due to surgical blood loss or risk of bleeding: yes

## 2012-04-18 NOTE — H&P (Signed)
Mike Shepard [phone] DOB:  [birth year]       Mike Shepard returns today to review his MRI of his lumbar spine which shows that he has a foraminal and extraforaminal disc herniation at L3-4 level on the left by review and with further discussion with the radiologist there appears to be an extraforaminal disc herniation at L3-4 on the left with significant left L3 nerve root compression.  This entirely fits with his symptoms.  He is quite miserable having to take regular pain medication and wishes to get relief from this situation.    I recommended that he undergo left L3-4 far lateral microdiskectomy minimally invasive surgery.  We will set this up in the relatively near future.  Risks and benefits were discussed in detail with the patient and he wishes to proceed.          Danae Orleans. Venetia Maxon, M.D./[facility]de [facility]   Mike Shepard DOB:  2066-08-19  #[id number]       HISTORY:     I was asked to see Mike Shepard for [employer].  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 He also notes numbness into his right elbow and weakness in his right arm, and says this began on 09/07/08 when machinery fell on his neck and upper back.  He had an MRI of his cervical spine which shows a posterior soft tissue injury at the region of C5-6 and also a disc herniation at C5-6 causing mass effect on the spinal cord, but with also severe right foraminal stenosis at C5-6.  Mike Shepard says that his posterior neck pain is getting better, but he has a lot of right arm pain which is not getting better.   He had prior surgery by Dr. Phoebe Shepard with a right L2-3 discectomy.   MRI was reviewed which was obtained through [facility].    REVIEW OF SYSTEMS:   A detailed Review of Systems sheet was reviewed with the patient.  Pertinent positives include high blood pressure, high cholesterol, arm weakness, arm pain, and neck  pain.  All other systems are negative; this includes Constitutional symptoms, Eyes, Ears, nose, mouth, throat, Endocrine, Respiratory, Gastrointestinal, Genitourinary, Integumentary & Breast, Neurologic, Psychiatric, Hematologic/Lymphatic, Allergic/Immunologic.    PAST MEDICAL HISTORY:      Current Medical Conditions:    Previously described.      Prior Operations and Hospitalizations:   Previously described.      Medications and Allergies:  Current medications are Clonidine .5 mg. daily for high blood pressure and Atenolol 50 mg. daily for high blood pressure.  No known drug allergies.  He has been taking Vicodin 5/500 two every six hours without a great deal of relief of his pain.      Height and Weight:     He is currently 6'0" tall, 200 lbs.    FAMILY HISTORY:    His mother is 71 in good health.     SOCIAL HISTORY:    He is a one pack per day smoker, social drinker of alcoholic beverages.  He denies any substance abuse.    PHYSICAL EXAMINATION:      General Appearance:   On examination today, Mike Shepard is an uncomfortable appearing man in no acute distress.      Blood Pressure, Pulse:     Currently his blood pressure is 122/80.  Heart rate of 64.      HEENT -  normocephalic, atraumatic.  The pupils are equal, round and reactive to light.  The extraocular muscles are intact.  Sclerae - white.  Conjunctiva - pink.  Oropharynx benign.  Uvula midline.     Neck - there are no masses, meningismus, deformities, tracheal deviation, jugular vein distention or carotid bruits.  There is normal cervical range of motion.  He has right parascapular discomfort, positive right Spurlings' maneuver and negative on the left.  Negative   Lhermitte's sign with axial compression.  No bruising over his posterior paraspinous region.      Respiratory - there is normal respiratory effort with good intercostal function.  Lungs are clear to auscultation.  There are no rales, rhonchi or wheezes.       Cardiovascular - the heart has regular rate and rhythm to auscultation.  No murmurs are appreciated.  There is no extremity edema, cyanosis or clubbing.  There are palpable pedal pulses.      Abdomen - soft, nontender, no hepatosplenomegaly appreciated or masses.  There are active bowel sounds.  No guarding or rebound.      Musculoskeletal Examination - the patient is able to walk about the examining room with a normal, casual, heel and toe gait.  Negative shoulder impingement testing.    NEUROLOGICAL EXAMINATION: The patient is oriented to time, person and place and has good recall of both recent and remote memory with normal attention span and concentration.  The patient speaks with clear and fluent speech and exhibits normal language function and appropriate fund of knowledge.    Cranial Nerve Examination - pupils are equal, round and reactive to light.  Extraocular movements are full.  Visual fields are full to confrontational testing.  Facial sensation and facial movement are symmetric and intact.  Hearing is intact to finger rub.  Palate is upgoing.  Shoulder shrug is symmetric.  Tongue protrudes in the midline.      Motor Examination - motor strength is 5/5 in the bilateral deltoids, triceps, handgrips, interosseous, 5/5 left biceps, right biceps at 4/5, 5/5 left wrist extension, and right wrist extension at 4+/5.  In the lower extremities motor strength is 5/5 in hip flexion, extension, quadriceps, hamstrings, plantar flexion, dorsiflexion and extensor hallucis longus.      Sensory Examination - he notes decreased pin sensation in a right C6 distribution.       Deep Tendon Reflexes - 2 in the biceps, triceps, and brachioradialis, 2 in the knees, 2 in the ankles.  The great toes are downgoing to plantar stimulation.      Cerebellar Examination - normal coordination in upper and lower extremities and normal rapid alternating movements.  Romberg test is negative.    IMPRESSION AND  RECOMMENDATIONS: Mike Shepard is a 46 year old man with neck and right arm pain.  He has weakness in a right C6 distribution. He has a large disc herniation at C5-6 in the foramen and also some canal compromise at C5-6.    I recommended to Mike Shepard that he undergo surgical intervention.  This will consist of a C5-6 decompression and I told him this could either be done with a cervical disc arthroplasty or with one-level cervical fusion. The cervical fusion is somewhat problematic given he is a smoker. I gave him a prescription for Chantix to help him stop smoking. I have also increased the strength of the pain medication to Hydrocodone 10/500 to see if this will help with the discomfort. I have recommended that the surgery be done on an  expedited fashion. I do not believe that there is going to be a lot of benefit to physical therapy and given the weakness and significant disc herniation, I think it is unlikely to be resolved with conservative measures.  I do recommend the arthroplasty as I think it will be likely to be something that he would recover from more rapidly and be able to return to work more rapidly than with a cervical fusion, but both options are reasonable ones to pursue. I discussed with the patient and also with Rogene Houston, [facility] medical case manager, the various options and he wishes to proceed with the cervical disc arthroplasty. I will wait to hear about approval before we can go ahead with the surgery.  The risks and benefits were discussed in detail with the patient.    [facility]    Danae Orleans. Venetia Maxon, M.D.

## 2012-04-18 NOTE — Transfer of Care (Signed)
Immediate Anesthesia Transfer of Care Note  Patient: Mike Shepard  Procedure(s) Performed: Procedure(s) (LRB): LUMBAR LAMINECTOMY/ DECOMPRESSION WITH MET-RX (Left)  Patient Location: PACU  Anesthesia Type: General  Level of Consciousness: awake, alert  and oriented  Airway & Oxygen Therapy: Patient Spontanous Breathing and Patient connected to nasal cannula oxygen  Post-op Assessment: Report given to PACU RN  Post vital signs: Reviewed  Complications: No apparent anesthesia complications

## 2012-04-18 NOTE — Discharge Summary (Addendum)
Physician Discharge Summary  Patient ID: Mike Shepard MRN: [id number] DOB/AGE: Dec 03, 1966 46 y.o.  Admit date: 04/18/2012 Discharge date: 04/18/2012  Admission Diagnoses:  Discharge Diagnoses:  Active Problems:  * No active hospital problems. *    Discharged Condition: good  Hospital Course: Left L3/4 far lateral microdiscectomy  Consults: None  Significant Diagnostic Studies: None  Treatments: surgery: Left L3/4 far lateral microdiscectomy  Discharge Exam: Blood pressure 143/87, pulse 52, temperature 97.7 F (36.5 C), temperature source Oral, resp. rate 16, SpO2 94.00%. Neurologic: Alert and oriented X 3, normal strength and tone. Normal symmetric reflexes. Normal coordination and gait Wound:CDI  Disposition: Home   Medication List  As of 04/18/2012  5:26 PM   TAKE these medications         acetaminophen 500 MG tablet   Commonly known as: TYLENOL   Take 1,000 mg by mouth every 6 (six) hours as needed. For pain.      atenolol 50 MG tablet   Commonly known as: TENORMIN   Take 50 mg by mouth daily.      HYDROcodone-acetaminophen 5-325 MG per tablet   Commonly known as: NORCO   Take 1-2 tablets by mouth every 6 (six) hours as needed. For pain      lisinopril 40 MG tablet   Commonly known as: PRINIVIL,ZESTRIL   Take 40 mg by mouth daily.      lisinopril 40 MG tablet   Commonly known as: PRINIVIL,ZESTRIL   TAKE ONE TABLET BY MOUTH EVERY MORNING      multivitamins ther. w/minerals Tabs   Take 1 tablet by mouth every morning.      omeprazole 20 MG capsule   Commonly known as: PRILOSEC   Take 20 mg by mouth daily.             Signed: Dorian Heckle, MD 04/18/2012, 5:26 PM  Final Dx:  Far lateral HNP L3/4 left with lumbar radiculopathy

## 2012-04-18 NOTE — Preoperative (Signed)
Beta Blockers   Reason not to administer Beta Blockers:Atenolol at 0330 hrs 04/18/2012

## 2012-04-19 NOTE — Progress Notes (Signed)
CARE MANAGEMENT NOTE 04/19/2012  Patient:  Mike Shepard, Mike Shepard   Account Number:  [id number]  Date Initiated:  04/19/2012  Documentation initiated by:  Vance Peper  Subjective/Objective Assessment:     Action/Plan:   patient had no home health needs   Anticipated DC Date:  04/18/2012   Anticipated DC Plan:  HOME/SELF CARE      DC Planning Services  CM consult             Status of service:  Completed, signed off

## 2012-04-25 ENCOUNTER — Encounter (HOSPITAL_COMMUNITY): Payer: Self-pay | Admitting: *Deleted

## 2012-04-25 ENCOUNTER — Emergency Department (HOSPITAL_COMMUNITY)
Admission: EM | Admit: 2012-04-25 | Discharge: 2012-04-25 | Disposition: A | Discharge status: Home / Self Care | Payer: 59 | Attending: Emergency Medicine | Admitting: Emergency Medicine

## 2012-04-25 ENCOUNTER — Emergency Department (HOSPITAL_COMMUNITY): Payer: 59

## 2012-04-25 DIAGNOSIS — I10 Essential (primary) hypertension: Secondary | ICD-10-CM | POA: Insufficient documentation

## 2012-04-25 DIAGNOSIS — M545 Low back pain, unspecified: Secondary | ICD-10-CM

## 2012-04-25 DIAGNOSIS — Z9889 Other specified postprocedural states: Secondary | ICD-10-CM | POA: Insufficient documentation

## 2012-04-25 DIAGNOSIS — R5381 Other malaise: Secondary | ICD-10-CM | POA: Insufficient documentation

## 2012-04-25 MED ORDER — HYDROMORPHONE HCL PF 1 MG/ML IJ SOLN
1.0000 mg | Freq: Once | INTRAMUSCULAR | Status: AC
  Administered 2012-04-25: 1 mg via INTRAVENOUS
  Filled 2012-04-25: qty 1

## 2012-04-25 MED ORDER — HYDROMORPHONE HCL PF 1 MG/ML IJ SOLN
1.0000 mg | Freq: Once | INTRAMUSCULAR | Status: AC
  Administered 2012-04-25: 1 mg via INTRAVENOUS

## 2012-04-25 MED ORDER — GADOBENATE DIMEGLUMINE 529 MG/ML IV SOLN
20.0000 mL | Freq: Once | INTRAVENOUS | Status: AC | PRN
  Administered 2012-04-25: 20 mL via INTRAVENOUS

## 2012-04-25 MED ORDER — DIAZEPAM 5 MG PO TABS: 5.0000 mg | ORAL_TABLET | Freq: Two times a day (BID) | ORAL | Status: AC

## 2012-04-25 MED ORDER — OXYCODONE-ACETAMINOPHEN 5-325 MG PO TABS: 1.0000 | ORAL_TABLET | ORAL | Status: AC | PRN

## 2012-04-25 MED ORDER — ONDANSETRON HCL 4 MG/2ML IJ SOLN
4.0000 mg | Freq: Once | INTRAMUSCULAR | Status: AC
  Administered 2012-04-25: 4 mg via INTRAVENOUS
  Filled 2012-04-25: qty 2

## 2012-04-25 MED ORDER — HYDROMORPHONE HCL PF 1 MG/ML IJ SOLN
INTRAMUSCULAR | Status: AC
  Filled 2012-04-25: qty 1

## 2012-04-25 NOTE — ED Notes (Signed)
Patient transported to MRI 

## 2012-04-25 NOTE — ED Provider Notes (Signed)
History     CSN: [id number]  Arrival date & time 04/25/12  1610   First MD Initiated Contact with Patient 04/25/12 1645      Chief Complaint  Patient presents with  . Back Pain    (Consider location/radiation/quality/duration/timing/severity/associated sxs/prior treatment) HPI Hx from pt. 46yo M who is s/p Far Lateral Left L 3/4 LUMBAR LAMINECTOMY/ DECOMPRESSION which was performed on May 2nd by Dr. Venetia Maxon. He went home the same day and was feeling better; was able to ambulate at home. States he had a violent coughing spell yesterday, which has caused increased pain at the site of his surgery. Since this, he has been unable to ambulate and has had to get a family member to help him get on and off the toilet and with other ADLs. He denies any sensation of numbness into the legs (he did have some radicular pain prior to his surgery but none since). He did have one episode of fecal incontinence. Has had no urinary retention or urinary incontinence.   States he called the office today and they told him that they would try to get him into clinic on Monday, but he states that he neglected to mention the episode of fecal incontinence to them.  Past Medical History  Diagnosis Date  . Hypertension   . GERD (gastroesophageal reflux disease)   . Hyperlipidemia   . Neuromuscular disorder     HNP    Past Surgical History  Procedure Date  . Back surgery   . Neck surgery     Family History  Problem Relation Age of Onset  . Hypertension Mother   . Hypertension Father   . Diabetes Father   . Hypertension Sister   . Hypertension Brother   . Diabetes Brother   . Anesthesia problems Neg Hx     History  Substance Use Topics  . Smoking status: Current Everyday Smoker    Last Attempt to Quit: 01/27/2010  . Smokeless tobacco: Not on file  . Alcohol Use: 1.2 oz/week    1 Glasses of wine, 1 Cans of beer per week      Review of Systems  Constitutional: Negative for fever and chills.    Respiratory: Negative for shortness of breath.   Cardiovascular: Negative for chest pain, palpitations and leg swelling.  Gastrointestinal: Negative for nausea, vomiting and abdominal pain.  Genitourinary: Negative for flank pain and difficulty urinating.  Musculoskeletal: Positive for back pain and gait problem. Negative for myalgias.  Skin: Negative for color change and rash.  Neurological: Positive for weakness. Negative for numbness.    Allergies  Review of patient's allergies indicates no known allergies.  Home Medications   Current Outpatient Rx  Name Route Sig Dispense Refill  . ACETAMINOPHEN 500 MG PO TABS Oral Take 1,000 mg by mouth every 6 (six) hours as needed. For pain.    . ATENOLOL 50 MG PO TABS Oral Take 50 mg by mouth daily.    Marland Kitchen HYDROCODONE-ACETAMINOPHEN 5-325 MG PO TABS Oral Take 1-2 tablets by mouth every 6 (six) hours as needed. For pain    . LISINOPRIL 40 MG PO TABS  TAKE ONE TABLET BY MOUTH EVERY MORNING 90 tablet 1  . THERA M PLUS PO TABS Oral Take 1 tablet by mouth every morning.     Marland Kitchen OMEPRAZOLE 20 MG PO CPDR Oral Take 20 mg by mouth daily.      BP 133/84  Pulse 72  Temp(Src) 98.6 F (37 C) (Oral)  Resp 20  Ht 6' (1.829 m)  Wt 212 lb (96.163 kg)  BMI 28.75 kg/m2  SpO2 100%  Physical Exam  Nursing note and vitals reviewed. Constitutional: He appears well-developed and well-nourished. No distress.  HENT:  Head: Normocephalic and atraumatic.  Neck: Normal range of motion. Neck supple.  Cardiovascular: Normal rate, regular rhythm and normal heart sounds.   Pulmonary/Chest: Effort normal and breath sounds normal. He exhibits no tenderness.  Abdominal: Soft. There is no tenderness.  Genitourinary:       Rectal exam deferred 2/2 pt's pain  Musculoskeletal: Normal range of motion.  Neurological: He is alert.  Reflex Scores:      Patellar reflexes are 1+ on the right side and 1+ on the left side.      Achilles reflexes are 1+ on the right side and 1+  on the left side.      Strength 5/5 to RLE on dorsi/plantar flex, knee flex/ext, quads/hamstrings; 4+/5 to LLE with same. Downgoing Babinski.  Skin: Skin is warm and dry. He is not diaphoretic.       Incision to L lower back c/d/i, no surrounding erythema, nontender to palpation  Psychiatric: He has a normal mood and affect.    ED Course  Procedures (including critical care time)  Labs Reviewed - No data to display Mr Lumbar Spine W Wo Contrast  04/25/2012  *RADIOLOGY REPORT*  Clinical Data: Status post recent N L3-4 laminectomy.  Low back pain.  Left lower extremity weakness.  Fecal incontinence.  The symptoms began after a coughing spell last evening.  MRI LUMBAR SPINE WITHOUT AND WITH CONTRAST  Technique:  Multiplanar and multiecho pulse sequences of the lumbar spine were obtained without and with intravenous contrast.  Contrast: 20mL MULTIHANCE GADOBENATE DIMEGLUMINE 529 MG/ML IV SOLN  Comparison: MRI of the lumbar spine without contrast 02/08/2012.  Findings: Normal signal is present in the conus medullaris which terminates at L1-2.  Marrow signal, vertebral body heights, alignment are normal.  Limited imaging of the abdomen is unremarkable.  The disc levels at L1-2 and above are normal.  L2-3:  Loss of disc height is again noted.  Mild right foraminal narrowing is stable.  The patient is status post right laminectomy.  L3-4:  The patient is status post partial facetectomy.  Granulation tissue surrounds the exiting left L3 nerve root.  No significant residual or recurrent disc protrusion is evident. Postoperative changes are noted on the left without significant fluid collection or abscess.  Mild enhancement is within normal limits following surgery.  L4-5:  Mild broad-based disc bulge is present.  There is no significant stenosis or change.  L5-S1:  A broad-based disc bulge is similar to the prior study. There is no significant stenosis or change.  IMPRESSION:  1.  Status post left facetectomy for  partial resection of a far left lateral disc protrusion.  No recurrent disc protrusion or stenosis is evident. 2.  Stable rightward disc herniation at L2-3. 3.  Stable disc bulging at L4-5 and L5-S1 without significant stenosis.  Original Report Authenticated By: Jamesetta Orleans. MATTERN, M.D.     No diagnosis found. 1) low back pain   MDM  Patient is status post recent L3-L4 laminectomy who presents with increased low back pain after a coughing spell. He is noted to have some subtle weakness to the left lower extremity on exam. Based on this, I did contact the neurosurgeon on call, Dr. Yetta Barre. He recommended repeat MRI of the lumbar spine for further evaluation. We did  proceed with this, which showed stable surgical changes with no new disc bulge or significant stenosis. Patient was given 2 doses of pain medication here, and stated he felt somewhat better with this. He was able to ambulate with one-person assist, but felt as if his back muscles were spasming with this. Based on this, we will plan to send him home, with close followup in clinic. He is instructed to call in the morning to see if he can secure an appointment for tomorrow. States he is running low on his pain medication and Valium, so will give him refills on this. Instructed to ice the back as well this evening. He was agreeable with this plan. Return precautions discussed.  Case d/w Dr. Weldon Inches.       Grant Fontana, [place] 04/25/12 2000

## 2012-04-25 NOTE — ED Notes (Signed)
Pt ambulated with 1 person assistance.

## 2012-04-25 NOTE — Discharge Instructions (Signed)
Your MRI did not show any new problems or disc bulging today. It is possible that this is muscle spasm. You have been given a small supply of pain and muscle relaxer medications. Plan to use ice to the back this evening as well. Please call the clinic in the morning to make a follow up appointment as soon as possible. If you have increased numbness, weakness in your legs, lose control of your bowels/bladder, or with any other concerns about worsening condition, return to the ED.  Back Pain, Adult Low back pain is very common. About 1 in 5 people have back pain.The cause of low back pain is rarely dangerous. The pain often gets better over time.About half of people with a sudden onset of back pain feel better in just 2 weeks. About 8 in 10 people feel better by 6 weeks.  CAUSES Some common causes of back pain include:  Strain of the muscles or ligaments supporting the spine.   Wear and tear (degeneration) of the spinal discs.   Arthritis.   Direct injury to the back.  DIAGNOSIS Most of the time, the direct cause of low back pain is not known.However, back pain can be treated effectively even when the exact cause of the pain is unknown.Answering your caregiver's questions about your overall health and symptoms is one of the most accurate ways to make sure the cause of your pain is not dangerous. If your caregiver needs more information, he or she may order lab work or imaging tests (X-rays or MRIs).However, even if imaging tests show changes in your back, this usually does not require surgery. HOME CARE INSTRUCTIONS For many people, back pain returns.Since low back pain is rarely dangerous, it is often a condition that people can learn to [facility] their own.   Remain active. It is stressful on the back to sit or stand in one place. Do not sit, drive, or stand in one place for more than 30 minutes at a time. Take short walks on level surfaces as soon as pain allows.Try to increase the length  of time you walk each day.   Do not stay in bed.Resting more than 1 or 2 days can delay your recovery.   Do not avoid exercise or work.Your body is made to move.It is not dangerous to be active, even though your back may hurt.Your back will likely heal faster if you return to being active before your pain is gone.   Pay attention to your body when you bend and lift. Many people have less discomfortwhen lifting if they bend their knees, keep the load close to their bodies,and avoid twisting. Often, the most comfortable positions are those that put less stress on your recovering back.   Find a comfortable position to sleep. Use a firm mattress and lie on your side with your knees slightly bent. If you lie on your back, put a pillow under your knees.   Only take over-the-counter or prescription medicines as directed by your caregiver. Over-the-counter medicines to reduce pain and inflammation are often the most helpful.Your caregiver may prescribe muscle relaxant drugs.These medicines help dull your pain so you can more quickly return to your normal activities and healthy exercise.   Put ice on the injured area.   Put ice in a plastic bag.   Place a towel between your skin and the bag.   Leave the ice on for 15 to 20 minutes, 3 to 4 times a day for the first 2 to 3  days. After that, ice and heat may be alternated to reduce pain and spasms.   Ask your caregiver about trying back exercises and gentle massage. This may be of some benefit.   Avoid feeling anxious or stressed.Stress increases muscle tension and can worsen back pain.It is important to recognize when you are anxious or stressed and learn ways to manage it.Exercise is a great option.  SEEK MEDICAL CARE IF:  You have pain that is not relieved with rest or medicine.   You have pain that does not improve in 1 week.   You have new symptoms.   You are generally not feeling well.  SEEK IMMEDIATE MEDICAL CARE IF:   You  have pain that radiates from your back into your legs.   You develop new bowel or bladder control problems.   You have unusual weakness or numbness in your arms or legs.   You develop nausea or vomiting.   You develop abdominal pain.   You feel faint.  Document Released: 12/04/2005 Document Revised: 11/23/2011 Document Reviewed: 04/24/2011 [facility] Patient Information 2012 [place], [place].

## 2012-04-25 NOTE — ED Notes (Signed)
Pt c/o lower back pain, states had surgery for bulging disc last Thursday. Pt reports experiencing a violent coughing spell which caused back to hurt. Denies any other injury. Reports "I coughed so hard that I used the BR on myself." Pt ambulatory but with increased pain.

## 2012-04-28 NOTE — ED Provider Notes (Signed)
Medical screening examination/treatment/procedure(s) were performed by non-physician practitioner and as supervising physician I was immediately available for consultation/collaboration.  Bartlomiej Jenkinson, MD 04/28/12 1108 

## 2012-04-30 ENCOUNTER — Telehealth: Payer: Self-pay | Admitting: Family Medicine

## 2012-04-30 NOTE — Telephone Encounter (Signed)
Patient called and stated he is closer to our location & would like to change to our practice, would that be ok with you? I can call patient back

## 2012-05-02 NOTE — Telephone Encounter (Signed)
Okay 

## 2012-05-02 NOTE — Telephone Encounter (Signed)
Spoke wt/patient made appt 6.24.13

## 2012-05-08 ENCOUNTER — Other Ambulatory Visit: Payer: Self-pay | Admitting: Neurosurgery

## 2012-05-08 DIAGNOSIS — IMO0002 Reserved for concepts with insufficient information to code with codable children: Secondary | ICD-10-CM

## 2012-05-08 DIAGNOSIS — M5126 Other intervertebral disc displacement, lumbar region: Secondary | ICD-10-CM

## 2012-05-08 DIAGNOSIS — M47817 Spondylosis without myelopathy or radiculopathy, lumbosacral region: Secondary | ICD-10-CM

## 2012-05-17 ENCOUNTER — Ambulatory Visit
Admission: RE | Admit: 2012-05-17 | Discharge: 2012-05-17 | Disposition: A | Discharge status: Home / Self Care | Payer: 59 | Source: Ambulatory Visit | Attending: Neurosurgery | Admitting: Neurosurgery

## 2012-05-17 DIAGNOSIS — M47817 Spondylosis without myelopathy or radiculopathy, lumbosacral region: Secondary | ICD-10-CM

## 2012-05-17 DIAGNOSIS — IMO0002 Reserved for concepts with insufficient information to code with codable children: Secondary | ICD-10-CM

## 2012-05-17 DIAGNOSIS — M5126 Other intervertebral disc displacement, lumbar region: Secondary | ICD-10-CM

## 2012-05-17 MED ORDER — GADOBENATE DIMEGLUMINE 529 MG/ML IV SOLN
20.0000 mL | Freq: Once | INTRAVENOUS | Status: AC | PRN
  Administered 2012-05-17: 20 mL via INTRAVENOUS

## 2012-05-22 ENCOUNTER — Other Ambulatory Visit: Payer: Self-pay | Admitting: Family Medicine

## 2012-06-10 ENCOUNTER — Ambulatory Visit: Payer: 59 | Admitting: Internal Medicine

## 2012-06-24 ENCOUNTER — Telehealth: Payer: Self-pay | Admitting: Family Medicine

## 2012-06-24 ENCOUNTER — Other Ambulatory Visit: Payer: Self-pay | Admitting: Family Medicine

## 2012-06-24 MED ORDER — LISINOPRIL 40 MG PO TABS: 40.0000 mg | ORAL_TABLET | Freq: Every day | ORAL | Status: DC

## 2012-06-24 MED ORDER — ATENOLOL-CHLORTHALIDONE 50-25 MG PO TABS: 1.0000 | ORAL_TABLET | Freq: Every day | ORAL | Status: DC

## 2012-06-24 NOTE — Telephone Encounter (Signed)
Pt has transferred care to Dr. Drue Novel, but does not see him until August. He needs refills on atenolol and lisinopril called in to get him through. Uses [facility] on [address] - thanks!

## 2012-07-19 ENCOUNTER — Ambulatory Visit (INDEPENDENT_AMBULATORY_CARE_PROVIDER_SITE_OTHER): Payer: 59 | Admitting: Internal Medicine

## 2012-07-19 ENCOUNTER — Encounter: Payer: Self-pay | Admitting: Internal Medicine

## 2012-07-19 VITALS — BP 130/86 | HR 60 | Temp 98.1°F | Ht 72.25 in | Wt 202.0 lb

## 2012-07-19 DIAGNOSIS — K219 Gastro-esophageal reflux disease without esophagitis: Secondary | ICD-10-CM

## 2012-07-19 DIAGNOSIS — R05 Cough: Secondary | ICD-10-CM

## 2012-07-19 DIAGNOSIS — R059 Cough, unspecified: Secondary | ICD-10-CM

## 2012-07-19 DIAGNOSIS — H6120 Impacted cerumen, unspecified ear: Secondary | ICD-10-CM

## 2012-07-19 DIAGNOSIS — I10 Essential (primary) hypertension: Secondary | ICD-10-CM

## 2012-07-19 DIAGNOSIS — Z0000 Encounter for general adult medical examination without abnormal findings: Secondary | ICD-10-CM

## 2012-07-19 DIAGNOSIS — M549 Dorsalgia, unspecified: Secondary | ICD-10-CM

## 2012-07-19 LAB — CBC WITH DIFFERENTIAL/PLATELET
Basophils Absolute: 0.1 10*3/uL (ref 0.0–0.1)
Basophils Relative: 1.1 % (ref 0.0–3.0)
Eosinophils Absolute: 0.2 10*3/uL (ref 0.0–0.7)
Eosinophils Relative: 2.4 % (ref 0.0–5.0)
HCT: 45.9 % (ref 39.0–52.0)
Hemoglobin: 15.5 g/dL (ref 13.0–17.0)
Lymphocytes Relative: 50.1 % — ABNORMAL HIGH (ref 12.0–46.0)
Lymphs Abs: 4.4 10*3/uL — ABNORMAL HIGH (ref 0.7–4.0)
MCHC: 33.8 g/dL (ref 30.0–36.0)
MCV: 91.5 fl (ref 78.0–100.0)
Monocytes Absolute: 0.6 10*3/uL (ref 0.1–1.0)
Monocytes Relative: 7.2 % (ref 3.0–12.0)
Neutro Abs: 3.4 10*3/uL (ref 1.4–7.7)
Neutrophils Relative %: 39.2 % — ABNORMAL LOW (ref 43.0–77.0)
Platelets: 258 10*3/uL (ref 150.0–400.0)
RBC: 5.01 Mil/uL (ref 4.22–5.81)
RDW: 12.8 % (ref 11.5–14.6)
WBC: 8.8 10*3/uL (ref 4.5–10.5)

## 2012-07-19 LAB — COMPREHENSIVE METABOLIC PANEL
ALT: 22 U/L (ref 0–53)
AST: 22 U/L (ref 0–37)
Albumin: 4.3 g/dL (ref 3.5–5.2)
Alkaline Phosphatase: 99 U/L (ref 39–117)
BUN: 15 mg/dL (ref 6–23)
CO2: 31 mEq/L (ref 19–32)
Calcium: 10 mg/dL (ref 8.4–10.5)
Chloride: 97 mEq/L (ref 96–112)
Creatinine, Ser: 1.2 mg/dL (ref 0.4–1.5)
GFR: 85.27 mL/min (ref >60.00)
Glucose, Bld: 87 mg/dL (ref 70–99)
Potassium: 3.7 mEq/L (ref 3.5–5.1)
Sodium: 136 mEq/L (ref 135–145)
Total Bilirubin: 0.7 mg/dL (ref 0.3–1.2)
Total Protein: 7.6 g/dL (ref 6.0–8.3)

## 2012-07-19 LAB — LIPID PANEL
Cholesterol: 183 mg/dL (ref 0–200)
HDL: 41.6 mg/dL (ref >39.00)
Total CHOL/HDL Ratio: 4
Triglycerides: 404 mg/dL — ABNORMAL HIGH (ref 0.0–149.0)
VLDL: 80.8 mg/dL — ABNORMAL HIGH (ref 0.0–40.0)

## 2012-07-19 LAB — PSA: PSA: 1.83 ng/mL (ref 0.10–4.00)

## 2012-07-19 LAB — LDL CHOLESTEROL, DIRECT: Direct LDL: 82.5 mg/dL

## 2012-07-19 MED ORDER — MELOXICAM 15 MG PO TABS: 15.0000 mg | ORAL_TABLET | Freq: Every day | ORAL | Status: DC

## 2012-07-19 MED ORDER — FLUTICASONE PROPIONATE 50 MCG/ACT NA SUSP: 2.0000 | Freq: Every day | NASAL | Status: DC

## 2012-07-19 MED ORDER — HYDROCODONE-ACETAMINOPHEN 5-325 MG PO TABS: 1.0000 | ORAL_TABLET | Freq: Two times a day (BID) | ORAL | Status: DC | PRN

## 2012-07-19 NOTE — Patient Instructions (Addendum)
For pain: Meloxicam once a day with food, watch for gastritis, abdominal pain, nausea, change in color of the stools. If that is the case let me know. If the pain continue after meloxicam ; okay to take hydrocodone as needed ----- For cough: Flonase 2 sprays in each side of the nose every day Claritin 10 mg over-the-counter one daily If the cough is not better within the next month, let me know. We'll have to change one of your BP meds ----- Next visit in 3 months.

## 2012-07-19 NOTE — Assessment & Plan Note (Signed)
Td 05 Never had a cscope + FH prostate cancer, DRE (-), check a PSA Labs  Diet-exercise discussed

## 2012-07-19 NOTE — Assessment & Plan Note (Addendum)
Patient had several spine surgeries, most recent one was this year, low back. He has been getting hydrocodone by Dr. Venetia Maxon but now he was asked to refill with PCP. In the past, he was seen by pain management, the patient reports that he was prescribed morphine but it was "too strong",  Then was prescribed Percocet but it was too weak so she was taking more than prescribed. Eventually pain management stop seen him because his drug test came back negative, the patient denies med diversion, he reports that he simply ran out of percocet before his appointment because he was taking pain medication more often. Plan--  refer to a different pain management. Meloxicam daily with food, GI precautions discussed Hydrocodone

## 2012-07-19 NOTE — Assessment & Plan Note (Signed)
Well-controlled, he is on ACEi and complains of cough. Will treat cough, if he's not better will call in a month for a change in his medication. See instructions

## 2012-07-19 NOTE — Assessment & Plan Note (Signed)
Cough for 2 months, complain of postnasal dripping, some throat itching. He also has cerumen impaction bilaterally. Plan: Flonase, Claritin. Clear cerumen impaction If not better in a month, he will call, will discontinue ACE inhibitors and start

## 2012-07-19 NOTE — Assessment & Plan Note (Addendum)
Lavage performed , cleared canal completely, TMs normal post lavage

## 2012-07-19 NOTE — Assessment & Plan Note (Signed)
Well controlled with PPIs, information about GERD prevention provided

## 2012-07-19 NOTE — Progress Notes (Signed)
  Subjective:    Patient ID: Mike Shepard, male    DOB: 01/09/1966, 46 y.o.   MRN: [id number]  HPI Transfer from another office.New patient to me. CPX. In addition to his physical we addressed a number of other issues, see assessment and plan. Additional time spent addressing the other issues.  Past medical history Hypertension Hyperlipidemia Positive PPD, remotely ~2000, s/p Rx  Chronic pain, see surgeries  H/o gout   Past surgical history Low back surgery 2007 Neck surgery 2010 Low back surgery again 2013  Social history Married, children x 3 Occupation-- city of [place] tobacco-- ~ 1 ppd ETOH-- socially  Diet-- regular Exercise-- used to walk more   Family history Diabetes-- F, M borderline  CAD-- F had CHF , died at 48 HTN-- M Stroke-- aunts Colon cancer-- no Prostate cancer-- PGF, dx ~ 60   Review of Systems Has chronic back pain, see assessment and plan 2 months history of cough occasional yellow or whitish sputum. Denies any fever or chills. No sinus pain or congestion. No itchy eyes or nose, occasionally he has itching of her throat. GERD symptoms are well-controlled as long as he takes PPIs. No nausea, vomiting, diarrhea or blood in the stools. No anxiety-depression. No dysuria, gross hematuria or difficulty urinating.     Objective:   Physical Exam General -- alert, well-developed, and well-nourished.   Neck --no thyromegaly , normal carotid pulse HEENT-- hard wax B, nose clear, face symmetric, not tender, throat wnl Lungs -- normal respiratory effort, no intercostal retractions, no accessory muscle use, and normal breath sounds.   Heart-- normal rate, regular rhythm, no murmur, and no gallop.   Abdomen--soft, non-tender, no distention, no masses, no HSM, no guarding, and no rigidity.   Extremities-- no pretibial edema bilaterally Rectal-- No external abnormalities noted. Normal sphincter tone. No rectal masses or tenderness. no stool found Prostate:   Prostate gland firm and smooth, no enlargement, nodularity, tenderness, mass, asymmetry or induration. Neurologic-- alert & oriented X3 and strength normal in all extremities. Psych-- Cognition and judgment appear intact. Alert and cooperative with normal attention span and concentration.  not anxious appearing and not depressed appearing.      Assessment & Plan:

## 2012-07-22 ENCOUNTER — Encounter: Payer: Self-pay | Admitting: *Deleted

## 2012-08-16 ENCOUNTER — Other Ambulatory Visit: Payer: Self-pay | Admitting: Internal Medicine

## 2012-08-16 NOTE — Telephone Encounter (Signed)
Refill TENORETIC 50-25 MG Take 1 tablet by mouth daily #30 wt/1-refill last fill was 7.8.13 Last ov 8.2.13 V70

## 2012-08-16 NOTE — Telephone Encounter (Signed)
Ok to refill? Was not prescribed by you.

## 2012-08-16 NOTE — Telephone Encounter (Signed)
Ok x 6 months 

## 2012-08-20 MED ORDER — ATENOLOL-CHLORTHALIDONE 50-25 MG PO TABS: 1.0000 | ORAL_TABLET | Freq: Every day | ORAL | Status: DC

## 2012-08-20 NOTE — Telephone Encounter (Signed)
Refill done.  

## 2012-10-21 ENCOUNTER — Ambulatory Visit: Payer: 59 | Admitting: Internal Medicine

## 2012-10-21 DIAGNOSIS — Z0289 Encounter for other administrative examinations: Secondary | ICD-10-CM

## 2012-12-02 ENCOUNTER — Telehealth: Payer: Self-pay | Admitting: Internal Medicine

## 2012-12-02 NOTE — Telephone Encounter (Signed)
Notes from Dr.Spivey reviewed, the patient has been dismissed from pain management because he tested negative for a urinary drug test meaning that he is not taking his medication. he refused a spinal cord stimulation trial. Will scan the notes, no pain medicines from this office.

## 2012-12-30 ENCOUNTER — Emergency Department (HOSPITAL_COMMUNITY)
Admission: EM | Admit: 2012-12-30 | Discharge: 2012-12-30 | Discharge status: Against Medical Advice | Payer: 59 | Attending: Emergency Medicine | Admitting: Emergency Medicine

## 2012-12-30 ENCOUNTER — Other Ambulatory Visit: Payer: Self-pay

## 2012-12-30 ENCOUNTER — Encounter (HOSPITAL_COMMUNITY): Payer: Self-pay | Admitting: *Deleted

## 2012-12-30 DIAGNOSIS — I10 Essential (primary) hypertension: Secondary | ICD-10-CM | POA: Insufficient documentation

## 2012-12-30 NOTE — ED Notes (Signed)
Pt reports checking BP when he woke up this AM and it was 202/135. Pt also c/o recent headaches and "chest pressure."

## 2012-12-30 NOTE — ED Notes (Signed)
Pt. Decided to leave.

## 2013-01-13 ENCOUNTER — Encounter (HOSPITAL_COMMUNITY): Payer: Self-pay | Admitting: Emergency Medicine

## 2013-01-13 ENCOUNTER — Emergency Department (HOSPITAL_COMMUNITY)
Admission: EM | Admit: 2013-01-13 | Discharge: 2013-01-13 | Disposition: A | Discharge status: Home / Self Care | Payer: 59 | Attending: Emergency Medicine | Admitting: Emergency Medicine

## 2013-01-13 ENCOUNTER — Emergency Department (HOSPITAL_COMMUNITY): Payer: 59

## 2013-01-13 DIAGNOSIS — F17200 Nicotine dependence, unspecified, uncomplicated: Secondary | ICD-10-CM | POA: Insufficient documentation

## 2013-01-13 DIAGNOSIS — Z862 Personal history of diseases of the blood and blood-forming organs and certain disorders involving the immune mechanism: Secondary | ICD-10-CM | POA: Insufficient documentation

## 2013-01-13 DIAGNOSIS — I10 Essential (primary) hypertension: Secondary | ICD-10-CM | POA: Insufficient documentation

## 2013-01-13 DIAGNOSIS — Z8669 Personal history of other diseases of the nervous system and sense organs: Secondary | ICD-10-CM | POA: Insufficient documentation

## 2013-01-13 DIAGNOSIS — IMO0002 Reserved for concepts with insufficient information to code with codable children: Secondary | ICD-10-CM | POA: Insufficient documentation

## 2013-01-13 DIAGNOSIS — R51 Headache: Secondary | ICD-10-CM | POA: Insufficient documentation

## 2013-01-13 DIAGNOSIS — Z8639 Personal history of other endocrine, nutritional and metabolic disease: Secondary | ICD-10-CM | POA: Insufficient documentation

## 2013-01-13 DIAGNOSIS — Z79899 Other long term (current) drug therapy: Secondary | ICD-10-CM | POA: Insufficient documentation

## 2013-01-13 DIAGNOSIS — K219 Gastro-esophageal reflux disease without esophagitis: Secondary | ICD-10-CM | POA: Insufficient documentation

## 2013-01-13 LAB — CBC WITH DIFFERENTIAL/PLATELET
Basophils Absolute: 0 10*3/uL (ref 0.0–0.1)
Basophils Relative: 0 % (ref 0–1)
Eosinophils Absolute: 0.1 10*3/uL (ref 0.0–0.7)
Eosinophils Relative: 1 % (ref 0–5)
HCT: 47.3 % (ref 39.0–52.0)
Hemoglobin: 17.2 g/dL — ABNORMAL HIGH (ref 13.0–17.0)
Lymphocytes Relative: 35 % (ref 12–46)
Lymphs Abs: 4 10*3/uL (ref 0.7–4.0)
MCH: 31.4 pg (ref 26.0–34.0)
MCHC: 36.4 g/dL — ABNORMAL HIGH (ref 30.0–36.0)
MCV: 86.5 fL (ref 78.0–100.0)
Monocytes Absolute: 0.9 10*3/uL (ref 0.1–1.0)
Monocytes Relative: 8 % (ref 3–12)
Neutro Abs: 6.4 10*3/uL (ref 1.7–7.7)
Neutrophils Relative %: 56 % (ref 43–77)
Platelets: 245 10*3/uL (ref 150–400)
RBC: 5.47 MIL/uL (ref 4.22–5.81)
RDW: 11.9 % (ref 11.5–15.5)
WBC: 11.5 10*3/uL — ABNORMAL HIGH (ref 4.0–10.5)

## 2013-01-13 LAB — BASIC METABOLIC PANEL
BUN: 21 mg/dL (ref 6–23)
CO2: 25 mEq/L (ref 19–32)
Calcium: 9.4 mg/dL (ref 8.4–10.5)
Chloride: 94 mEq/L — ABNORMAL LOW (ref 96–112)
Creatinine, Ser: 1.12 mg/dL (ref 0.50–1.35)
GFR calc Af Amer: 89 mL/min — ABNORMAL LOW (ref >90)
GFR calc non Af Amer: 77 mL/min — ABNORMAL LOW (ref >90)
Glucose, Bld: 109 mg/dL — ABNORMAL HIGH (ref 70–99)
Potassium: 3.9 mEq/L (ref 3.5–5.1)
Sodium: 131 mEq/L — ABNORMAL LOW (ref 135–145)

## 2013-01-13 NOTE — ED Provider Notes (Signed)
History     CSN: [id number]  Arrival date & time 01/13/13  1600   First MD Initiated Contact with Patient 01/13/13 1721      Chief Complaint  Patient presents with  . Hypertension    (Consider location/radiation/quality/duration/timing/severity/associated sxs/prior treatment) Patient is a 47 y.o. male presenting with hypertension. The history is provided by the patient (the pt states he has had some headaches and his bp has been elevated.  he has not seen his md in over 6 months). No language interpreter was used.  Hypertension This is a recurrent problem. The current episode started more than 1 week ago. The problem occurs constantly. The problem has not changed since onset.Pertinent negatives include no chest pain, no abdominal pain and no headaches. Nothing aggravates the symptoms. Nothing relieves the symptoms. The treatment provided moderate relief.    Past Medical History  Diagnosis Date  . Hypertension   . GERD (gastroesophageal reflux disease)   . Hyperlipidemia   . Neuromuscular disorder     HNP    Past Surgical History  Procedure Date  . Back surgery   . Neck surgery     Family History  Problem Relation Age of Onset  . Hypertension Mother   . Hypertension Father   . Diabetes Father   . Hypertension Sister   . Hypertension Brother   . Diabetes Brother   . Anesthesia problems Neg Hx     History  Substance Use Topics  . Smoking status: Current Every Day Smoker    Last Attempt to Quit: 01/27/2010  . Smokeless tobacco: Not on file  . Alcohol Use: 1.2 oz/week    1 Glasses of wine, 1 Cans of beer per week      Review of Systems  Constitutional: Negative for fatigue.  HENT: Negative for congestion, sinus pressure and ear discharge.   Eyes: Negative for discharge.  Respiratory: Negative for cough.   Cardiovascular: Negative for chest pain.  Gastrointestinal: Negative for abdominal pain and diarrhea.  Genitourinary: Negative for frequency and hematuria.   Musculoskeletal: Negative for back pain.  Skin: Negative for rash.  Neurological: Negative for seizures and headaches.  Hematological: Negative.   Psychiatric/Behavioral: Negative for hallucinations.    Allergies  Review of patient's allergies indicates no known allergies.  Home Medications   Current Outpatient Rx  Name  Route  Sig  Dispense  Refill  . AMLODIPINE BESYLATE 10 MG PO TABS   Oral   Take 10 mg by mouth daily.         . ATENOLOL-CHLORTHALIDONE 50-25 MG PO TABS   Oral   Take 1 tablet by mouth daily.   30 tablet   6     Last refill from Dr Tawanna Cooler   . BUPRENORPHINE HCL-NALOXONE HCL 8-2 MG SL SUBL   Sublingual   Place 1 tablet under the tongue 3 (three) times daily.         Marland Kitchen CLONIDINE HCL 0.1 MG PO TABS   Oral   Take 0.1 mg by mouth daily.         Marland Kitchen FLUTICASONE PROPIONATE 50 MCG/ACT NA SUSP   Nasal   Place 2 sprays into the nose daily.   16 g   12   . LANSOPRAZOLE 15 MG PO CPDR   Oral   Take 15 mg by mouth daily as needed. For acid reflux.         . ADULT MULTIVITAMIN W/MINERALS CH   Oral   Take 1 tablet by mouth  daily.           BP 125/88  Pulse 66  Temp 98 F (36.7 C) (Oral)  Resp 18  SpO2 95%  Physical Exam  Constitutional: He is oriented to person, place, and time. He appears well-developed.  HENT:  Head: Normocephalic and atraumatic.  Eyes: Conjunctivae normal and EOM are normal. No scleral icterus.  Neck: Neck supple. No thyromegaly present.  Cardiovascular: Normal rate and regular rhythm.  Exam reveals no gallop and no friction rub.   No murmur heard. Pulmonary/Chest: No stridor. He has no wheezes. He has no rales. He exhibits no tenderness.  Abdominal: He exhibits no distension. There is no tenderness. There is no rebound.  Musculoskeletal: Normal range of motion. He exhibits no edema.  Lymphadenopathy:    He has no cervical adenopathy.  Neurological: He is oriented to person, place, and time. Coordination normal.  Skin:  No rash noted. No erythema.  Psychiatric: He has a normal mood and affect. His behavior is normal.    ED Course  Procedures (including critical care time)  Labs Reviewed  CBC WITH DIFFERENTIAL - Abnormal; Notable for the following:    WBC 11.5 (*)     Hemoglobin 17.2 (*)     MCHC 36.4 (*)     All other components within normal limits  BASIC METABOLIC PANEL - Abnormal; Notable for the following:    Sodium 131 (*)     Chloride 94 (*)     Glucose, Bld 109 (*)     GFR calc non Af Amer 77 (*)     GFR calc Af Amer 89 (*)     All other components within normal limits   Dg Chest Port 1 View  01/13/2013  *RADIOLOGY REPORT*  Clinical Data: Chest pain.  PORTABLE CHEST - 1 VIEW  Comparison: Fourth 32,013.  Findings: The cardiac silhouette, mediastinal and hilar contours are within normal limits and stable. The lungs are clear.  No pleural effusions.  The bony thorax is intact.  IMPRESSION: Normal chest x-ray.   Original Report Authenticated By: Rudie Meyer, M.D.      1. Hypertension       MDM          Benny Lennert, MD 01/13/13 2018

## 2013-01-13 NOTE — ED Notes (Signed)
Patient reports headaches and hypertension x 2 weeks.  Patient states he takes BP meds that are not working sufficiently.

## 2013-02-28 ENCOUNTER — Ambulatory Visit: Payer: 59 | Admitting: Internal Medicine

## 2013-02-28 DIAGNOSIS — Z0289 Encounter for other administrative examinations: Secondary | ICD-10-CM

## 2013-03-17 ENCOUNTER — Other Ambulatory Visit: Payer: Self-pay | Admitting: Internal Medicine

## 2013-03-17 NOTE — Telephone Encounter (Signed)
Refill done.  

## 2013-07-25 ENCOUNTER — Ambulatory Visit (INDEPENDENT_AMBULATORY_CARE_PROVIDER_SITE_OTHER): Payer: 59 | Admitting: Internal Medicine

## 2013-07-25 ENCOUNTER — Telehealth: Payer: Self-pay | Admitting: Internal Medicine

## 2013-07-25 VITALS — BP 160/98 | HR 81 | Temp 98.4°F | Wt 208.2 lb

## 2013-07-25 DIAGNOSIS — M549 Dorsalgia, unspecified: Secondary | ICD-10-CM

## 2013-07-25 DIAGNOSIS — I10 Essential (primary) hypertension: Secondary | ICD-10-CM

## 2013-07-25 MED ORDER — METHOCARBAMOL 750 MG PO TABS: 750.0000 mg | ORAL_TABLET | Freq: Three times a day (TID) | ORAL | Status: DC | PRN

## 2013-07-25 MED ORDER — PREDNISONE 10 MG PO TABS: ORAL_TABLET | ORAL | Status: DC

## 2013-07-25 MED ORDER — CLONIDINE HCL 0.1 MG PO TABS: 0.1000 mg | ORAL_TABLET | Freq: Two times a day (BID) | ORAL | Status: DC

## 2013-07-25 NOTE — Assessment & Plan Note (Addendum)
Acute exacerbation of back pain, before the recent incident he was doing very good. The patient was dismissed from Dr Cyndia Diver  pain clinic a few months ago because he tested negative for the prescribed medications. This is a second time he  is d/c from a  pain clinic. Plan: see instructions. Won't be able to rx narcotics

## 2013-07-25 NOTE — Patient Instructions (Addendum)
Rest Heating pad Prednisone as prescribed Robaxin (muscle relaxant ) 3 times a day as needed, will cause drowsiness Tylenol  500 mg OTC 2 tabs a day every 8 hours as needed for pain Need to see your back doctor , call for a referral if needed --- Your BP is elevated today, go back on clonidine TWICE a day Check the  blood pressure 2 or 3 times a week, be sure it is between 110/60 and 140/85. If it is consistently higher or lower, let me know

## 2013-07-25 NOTE — Telephone Encounter (Signed)
Appointment Scheduled:  07/25/2013 14:45:00  Appointment Scheduled Provider:  Willow Ora

## 2013-07-25 NOTE — Assessment & Plan Note (Addendum)
BP elevated today, he ran out of clonidine and is in pain. Plan: Refilled clonidine, increased from 1 a day to one twice a day

## 2013-07-25 NOTE — Telephone Encounter (Signed)
Patient Information:  Caller Name: Ladell  Phone: [phone]  Patient: Mike Shepard, Mike Shepard  Gender: Male  DOB: Sep 18, 1966  Age: 47 Years  PCP: Willow Ora  Office Follow Up:  Does the office need to follow up with this patient?: No  Instructions For The Office: N/A   Symptoms  Reason For Call & Symptoms: Has had back pain, 2 lower back surgeries in the past.  Has been doing well for the last few months.  Was shoveling at home on Wed 8/6  and felt something pull in lower back. Treatments not helping  Reviewed Health History In EMR: Yes  Reviewed Medications In EMR: Yes  Reviewed Allergies In EMR: Yes  Reviewed Surgeries / Procedures: Yes  Date of Onset of Symptoms: 07/23/2013  Treatments Tried: Ice pack, Tylenol, Motrin  Treatments Tried Worked: No  Guideline(s) Used:  Back Injury  Back Pain  Disposition Per Guideline:   Go to Office Now  Reason For Disposition Reached:   Severe back pain  Advice Given:  Call Back If:  You become worse.  Patient Will Follow Care Advice:  YES  Appointment Scheduled:  07/25/2013 14:45:00 Appointment Scheduled Provider:  Willow Ora

## 2013-07-25 NOTE — Progress Notes (Signed)
  Subjective:    Patient ID: Mike Shepard, male    DOB: [birth year], 47 y.o.   MRN: [id number]  HPI Acute visit 3 days ago he was fixing his lawnmower, twisted his back, he was in an awkward position and immediately after he developed right lower back pain that radiates to the right buttock. Symptoms are worse when he bends or twist  his torso and better when he stay seated. He has a long history of back problems bad lately he was doing pretty well until this incident. BP is noted to be elevated, he ran out of clonidine  Past medical history Hypertension Hyperlipidemia Positive PPD, remotely ~2000, s/p Rx   Chronic pain, see surgeries   H/o gout   Past surgical history Low back surgery 2007 Neck surgery 2010 Low back surgery again 2013  Social history Married, children x 3 Occupation-- city of [place] tobacco-- ~ 1 ppd ETOH-- socially     Family history Diabetes-- F, M borderline   CAD-- F had CHF , died at 42 HTN-- M Stroke-- aunts Colon cancer-- no Prostate cancer-- PGF, dx ~ 11  Review of Systems  No fever or chills No bladder or bowel incontinence No direct injury to the back. No lower extremity tingling or paresthesias    Objective:   Physical Exam BP 160/98  Pulse 81  Temp(Src) 98.4 F (36.9 C) (Oral)  Wt 208 lb 3.2 oz (94.439 kg)  BMI 28.05 kg/m2  SpO2 99%  General -- alert, well-developed.   Extremities-- no pretibial edema bilaterally Back-- slt tender at the lower R back  Neurologic-- alert & oriented X3 ; Antalgic posture, walks with a limp, Decrease motor at the proximal right lower extremity, unclear if it is truly weakness or related to pain. DTRs symmetric----> slightly decreased ankle jerks bilaterally. Straight leg test while  patient is  seated was negative. Psych--   not anxious appearing and not depressed appearing.       Assessment & Plan:

## 2013-07-27 ENCOUNTER — Encounter: Payer: Self-pay | Admitting: Internal Medicine

## 2013-09-09 ENCOUNTER — Telehealth: Payer: Self-pay

## 2013-09-09 NOTE — Telephone Encounter (Signed)
LVM for CB. Flu Vaccine due PSA within range 07/2012

## 2013-09-10 ENCOUNTER — Encounter: Payer: 59 | Admitting: Internal Medicine

## 2013-09-10 DIAGNOSIS — Z0289 Encounter for other administrative examinations: Secondary | ICD-10-CM

## 2013-09-10 NOTE — Telephone Encounter (Signed)
Unable to reach for Pre Visit Planning

## 2013-09-18 ENCOUNTER — Telehealth: Payer: Self-pay | Admitting: *Deleted

## 2013-09-18 MED ORDER — ATENOLOL-CHLORTHALIDONE 50-25 MG PO TABS: ORAL_TABLET | ORAL | Status: DC

## 2013-09-18 NOTE — Telephone Encounter (Signed)
rx refilled per protocol. DJR  

## 2013-10-03 ENCOUNTER — Telehealth: Payer: Self-pay

## 2013-10-03 NOTE — Telephone Encounter (Signed)
Medication List and allergies: done  90 day supply/mail order: none Local prescriptions: [facility] [address]  Immunizations due: declines flu vaccine  A/P:  LAST: PSA: UTD 07/2012   CCS: na  DM:  CMP 12/2012 Eye Exam:   HTN: Due 12/2012 Lipids: Due 12/2012  Recent family history or surgical procedures: none   To Discuss with Provider:  Patient will need refills Has been out of amlodipine

## 2013-10-07 ENCOUNTER — Encounter: Payer: 59 | Admitting: Internal Medicine

## 2013-10-30 ENCOUNTER — Telehealth: Payer: Self-pay

## 2013-10-30 NOTE — Telephone Encounter (Signed)
Patient working--did not review all questions  Medication List and allergies: reviewed  90 day supply/mail order: na Local prescriptions: [facility]  Immunizations due: declines flu vaccine  A/P:   No changes to FH or PSH Tdap 2005  Never had a CCS + FH prostate cancer PSA--07/2012--1.83 Patient did not give the opportunity to discuss the outside Rx on 09/23/2013 for #45 Buprenorphin 8mg   To Discuss with Provider: Not at this time

## 2013-10-31 ENCOUNTER — Encounter: Payer: Self-pay | Admitting: Internal Medicine

## 2013-10-31 ENCOUNTER — Ambulatory Visit (INDEPENDENT_AMBULATORY_CARE_PROVIDER_SITE_OTHER): Payer: 59 | Admitting: Internal Medicine

## 2013-10-31 VITALS — BP 166/93 | HR 70 | Temp 97.7°F | Ht 72.5 in | Wt 214.0 lb

## 2013-10-31 DIAGNOSIS — M549 Dorsalgia, unspecified: Secondary | ICD-10-CM

## 2013-10-31 DIAGNOSIS — E785 Hyperlipidemia, unspecified: Secondary | ICD-10-CM

## 2013-10-31 DIAGNOSIS — I10 Essential (primary) hypertension: Secondary | ICD-10-CM

## 2013-10-31 DIAGNOSIS — Z0000 Encounter for general adult medical examination without abnormal findings: Secondary | ICD-10-CM

## 2013-10-31 LAB — COMPREHENSIVE METABOLIC PANEL
ALT: 34 U/L (ref 0–53)
AST: 24 U/L (ref 0–37)
Albumin: 4 g/dL (ref 3.5–5.2)
Alkaline Phosphatase: 109 U/L (ref 39–117)
BUN: 16 mg/dL (ref 6–23)
CO2: 29 mEq/L (ref 19–32)
Calcium: 9.9 mg/dL (ref 8.4–10.5)
Chloride: 98 mEq/L (ref 96–112)
Creatinine, Ser: 1.1 mg/dL (ref 0.4–1.5)
GFR: 91.96 mL/min (ref >60.00)
Glucose, Bld: 105 mg/dL — ABNORMAL HIGH (ref 70–99)
Potassium: 3.5 mEq/L (ref 3.5–5.1)
Sodium: 134 mEq/L — ABNORMAL LOW (ref 135–145)
Total Bilirubin: 0.5 mg/dL (ref 0.3–1.2)
Total Protein: 7.7 g/dL (ref 6.0–8.3)

## 2013-10-31 LAB — CBC WITH DIFFERENTIAL/PLATELET
Basophils Absolute: 0 10*3/uL (ref 0.0–0.1)
Basophils Relative: 0.6 % (ref 0.0–3.0)
Eosinophils Absolute: 0.2 10*3/uL (ref 0.0–0.7)
Eosinophils Relative: 2 % (ref 0.0–5.0)
HCT: 47.2 % (ref 39.0–52.0)
Hemoglobin: 16 g/dL (ref 13.0–17.0)
Lymphocytes Relative: 44.5 % (ref 12.0–46.0)
Lymphs Abs: 3.8 10*3/uL (ref 0.7–4.0)
MCHC: 34 g/dL (ref 30.0–36.0)
MCV: 88.1 fl (ref 78.0–100.0)
Monocytes Absolute: 0.8 10*3/uL (ref 0.1–1.0)
Monocytes Relative: 9.3 % (ref 3.0–12.0)
Neutro Abs: 3.8 10*3/uL (ref 1.4–7.7)
Neutrophils Relative %: 43.6 % (ref 43.0–77.0)
Platelets: 233 10*3/uL (ref 150.0–400.0)
RBC: 5.36 Mil/uL (ref 4.22–5.81)
RDW: 13.1 % (ref 11.5–14.6)
WBC: 8.6 10*3/uL (ref 4.5–10.5)

## 2013-10-31 LAB — LIPID PANEL
Cholesterol: 171 mg/dL (ref 0–200)
HDL: 33.3 mg/dL — ABNORMAL LOW (ref >39.00)
Total CHOL/HDL Ratio: 5
Triglycerides: 337 mg/dL — ABNORMAL HIGH (ref 0.0–149.0)
VLDL: 67.4 mg/dL — ABNORMAL HIGH (ref 0.0–40.0)

## 2013-10-31 LAB — TSH: TSH: 0.15 u[IU]/mL — ABNORMAL LOW (ref 0.35–5.50)

## 2013-10-31 MED ORDER — AMLODIPINE BESYLATE 10 MG PO TABS: 10.0000 mg | ORAL_TABLET | Freq: Every day | ORAL | Status: DC

## 2013-10-31 MED ORDER — ATENOLOL-CHLORTHALIDONE 50-25 MG PO TABS: ORAL_TABLET | ORAL | Status: DC

## 2013-10-31 NOTE — Assessment & Plan Note (Signed)
BP elevated again today, states he has been out of amlodipine for a month, when he takes all his medications reports normal ambulatory BPs. Plan: We'll refill amlodipine, return to the office 6 weeks

## 2013-10-31 NOTE — Progress Notes (Signed)
  Subjective:    Patient ID: Mike Shepard, male    DOB: 07-Jan-1966, 47 y.o.   MRN: [id number]  HPI CPX    Past Medical History  Diagnosis Date  . Hypertension   . GERD (gastroesophageal reflux disease)   . Hyperlipidemia   . Chronic back pain     see surgeries   . PPD positive     Positive PPD, remotely ~2000, s/p Rx    . H/O: gout    Past Surgical History  Procedure Laterality Date  . Back surgery      2007-2013 (low back)  . Neck surgery      2010   History   Social History  . Marital Status: Married    Spouse Name: N/A    Number of Children: 3  . Years of Education: N/A   Occupational History  . city of [place] [address]   Social History Main Topics  . Smoking status: Current Every Day Smoker    Types: Cigarettes    Last Attempt to Quit: 01/27/2010  . Smokeless tobacco: Never Used  . Alcohol Use: 1.2 oz/week    1 Glasses of wine, 1 Cans of beer per week     Comment: socially   . Drug Use: No     Comment: denies   . Sexual Activity: Yes    Birth Control/ Protection: None   Other Topics Concern  . Not on file   Social History Narrative  . No narrative on file   Family History  Problem Relation Age of Onset  . Hypertension Mother     M,F,Sis, bro  . Diabetes Father     F, M. B  . Anesthesia problems Neg Hx   . Prostate cancer Other     PGF, dx ~ 69  . Colon cancer Neg Hx       Review of Systems Diet-- regular  Exercise-- active at work No  CP, SOB, lower extremity edema. No  DOE. Reports a history of palpitations on and off x 2-3 months, described as heart going fast, at rest, no exertional symptoms, not associated nausea, fainting, episodes last approximately 15 minutes. Feeling slightly anxious?. Denies depression. Denies  nausea, vomiting diarrhea Denies  blood in the stools (-) cough, sputum production. (-) wheezing, chest congestion No dysuria, gross hematuria, difficulty urinating       Objective:   Physical Exam  BP  166/93  Pulse 70  Temp(Src) 97.7 F (36.5 C)  Ht 6' 0.5" (1.842 m)  Wt 214 lb (97.07 kg)  BMI 28.61 kg/m2  SpO2 98% General -- alert, well-developed, NAD.  Neck --no thyromegaly , normal carotid pulse Lungs -- normal respiratory effort, no intercostal retractions, no accessory muscle use, and normal breath sounds.  Heart-- normal rate, regular rhythm, no murmur.  Abdomen-- Not distended, good bowel sounds,soft, non-tender. No rebound or rigidity. No mass,organomegaly. Rectal-- No external abnormalities noted. Normal sphincter tone. No rectal masses or tenderness. Brown stool. Prostate--Prostate gland firm and smooth, no enlargement, nodularity, tenderness, mass, asymmetry or induration. Extremities-- no pretibial edema bilaterally  Neurologic--  alert & oriented X3. Speech normal, gait normal, strength normal in all extremities.  Psych-- Cognition and judgment appear intact. Cooperative with normal attention span and concentration. No anxious appearing , no depressed appearing.      Assessment & Plan:

## 2013-10-31 NOTE — Assessment & Plan Note (Addendum)
Td 05 Declined flu shot Never had a cscope + FH prostate cancer, GF age 47, DRE (-) today , last 4 PSAs wnl. Consider PSA next year Labs  C/o plapitations , EKG normal sinus rhythm, rate 64, cardiovascular exam normal. Recommend observation and reassess in 6 weeks. Diet-exercise discussed

## 2013-10-31 NOTE — Patient Instructions (Addendum)
Get your blood work before you leave  Next visit in 6 weeks for a hypertension-palpitations  follow up . No Fasting Please make an appointment    For allergies, trying OTC Nasacort 2 sprays in each side of the nose daily

## 2013-10-31 NOTE — Progress Notes (Signed)
Pre visit review using our clinic review tool, if applicable. No additional management support is needed unless otherwise documented below in the visit note. 

## 2013-10-31 NOTE — Assessment & Plan Note (Signed)
Chronic back pain, managed elsewhere

## 2013-11-03 LAB — LDL CHOLESTEROL, DIRECT: Direct LDL: 95 mg/dL

## 2013-11-06 ENCOUNTER — Other Ambulatory Visit: Payer: Self-pay | Admitting: *Deleted

## 2013-11-06 ENCOUNTER — Encounter: Payer: Self-pay | Admitting: *Deleted

## 2013-11-06 DIAGNOSIS — R899 Unspecified abnormal finding in specimens from other organs, systems and tissues: Secondary | ICD-10-CM

## 2013-12-22 ENCOUNTER — Encounter: Payer: Self-pay | Admitting: Family Medicine

## 2013-12-22 ENCOUNTER — Ambulatory Visit (INDEPENDENT_AMBULATORY_CARE_PROVIDER_SITE_OTHER): Payer: 59 | Admitting: Family Medicine

## 2013-12-22 VITALS — BP 140/90 | HR 88 | Temp 98.7°F | Wt 217.4 lb

## 2013-12-22 DIAGNOSIS — J029 Acute pharyngitis, unspecified: Secondary | ICD-10-CM

## 2013-12-22 MED ORDER — AMOXICILLIN 875 MG PO TABS: 875.0000 mg | ORAL_TABLET | Freq: Two times a day (BID) | ORAL | Status: DC

## 2013-12-22 NOTE — Progress Notes (Signed)
Pre visit review using our clinic review tool, if applicable. No additional management support is needed unless otherwise documented below in the visit note. 

## 2013-12-22 NOTE — Patient Instructions (Signed)

## 2013-12-22 NOTE — Progress Notes (Signed)
  Subjective:     Mike Shepard is a 48 y.o. male who presents for evaluation of sore throat. Associated symptoms include chest congestion, pain while swallowing, post nasal drip, productive cough, sore throat, swollen glands and no fever. Onset of symptoms was 3 days ago, and have been gradually worsening since that time. He is drinking plenty of fluids. He has not had a recent close exposure to someone with proven streptococcal pharyngitis.  The following portions of the patient's history were reviewed and updated as appropriate: allergies, current medications, past family history, past medical history, past social history, past surgical history and problem list.  Review of Systems Pertinent items are noted in HPI.    Objective:    BP 140/90  Pulse 88  Temp(Src) 98.7 F (37.1 C) (Oral)  Wt 217 lb 6.4 oz (98.612 kg)  SpO2 97% General appearance: alert, cooperative, appears stated age and no distress Ears: normal TM's and external ear canals both ears Nose: Nares normal. Septum midline. Mucosa normal. No drainage or sinus tenderness. Throat: abnormal findings: mild oropharyngeal erythema Neck: moderate anterior cervical adenopathy, supple, symmetrical, trachea midline and thyroid not enlarged, symmetric, no tenderness/mass/nodules Lungs: clear to auscultation bilaterally Heart: S1, S2 normal  Laboratory Strep test done. Results:negative.    Assessment:    Acute pharyngitis, likely  -.    Plan:    Patient placed on antibiotics. Use of OTC analgesics recommended as well as salt water gargles. Follow up as needed. otc cough med

## 2013-12-24 LAB — CULTURE, GROUP A STREP: Organism ID, Bacteria: NORMAL

## 2013-12-26 ENCOUNTER — Ambulatory Visit (INDEPENDENT_AMBULATORY_CARE_PROVIDER_SITE_OTHER): Payer: 59 | Admitting: Internal Medicine

## 2013-12-26 ENCOUNTER — Encounter: Payer: Self-pay | Admitting: Internal Medicine

## 2013-12-26 VITALS — BP 156/91 | HR 90 | Temp 98.0°F | Wt 215.0 lb

## 2013-12-26 DIAGNOSIS — R6889 Other general symptoms and signs: Secondary | ICD-10-CM

## 2013-12-26 DIAGNOSIS — R7989 Other specified abnormal findings of blood chemistry: Secondary | ICD-10-CM

## 2013-12-26 DIAGNOSIS — R7309 Other abnormal glucose: Secondary | ICD-10-CM

## 2013-12-26 DIAGNOSIS — I10 Essential (primary) hypertension: Secondary | ICD-10-CM

## 2013-12-26 DIAGNOSIS — R739 Hyperglycemia, unspecified: Secondary | ICD-10-CM

## 2013-12-26 MED ORDER — LOSARTAN POTASSIUM 100 MG PO TABS: 100.0000 mg | ORAL_TABLET | Freq: Every day | ORAL | Status: DC

## 2013-12-26 NOTE — Progress Notes (Signed)
Pre visit review using our clinic review tool, if applicable. No additional management support is needed unless otherwise documented below in the visit note. 

## 2013-12-26 NOTE — Patient Instructions (Signed)
Stop clonidin, start losartan 100 mg one tablet daily. Continue monitoring her blood pressure, be sure it is between 110/60 and 140/85. Ideal blood pressure is 120/80. If it is consistently higher or lower, let me know. Call if he has side effects with the new medication.  Come back in 2 weeks for blood work: BMP--- dx  hypertension A1c --- dx hyperglycemia TSH, free T3, free T4--- dx abnormal TSH  Next office visit in 3 months

## 2013-12-26 NOTE — Progress Notes (Signed)
   Subjective:    Patient ID: Mike Shepard, male    DOB: 23-Nov-1966, 48 y.o.   MRN: [id number]  HPI ROV HTN-- good medication compliance, ambulatory BPs around 150/90 or 95. He is supposed to take clonidin twice a day but taking it only at night because he gets sleepy Palpitations--since the last time he was here symptoms have essentially resolved.   Past Medical History  Diagnosis Date  . Hypertension   . GERD (gastroesophageal reflux disease)   . Hyperlipidemia   . Chronic back pain     see surgeries   . PPD positive     Positive PPD, remotely ~2000, s/p Rx    . H/O: gout    Past Surgical History  Procedure Laterality Date  . Back surgery      2007-2013 (low back)  . Neck surgery      2010    Review of Systems Denies chest pain or shortness or breath He does follow a low-salt diet.    Objective:   Physical Exam BP 156/91  Pulse 90  Temp(Src) 98 F (36.7 C)  Wt 215 lb (97.523 kg)  SpO2 100% General -- alert, well-developed, NAD.   Lungs -- normal respiratory effort, no intercostal retractions, no accessory muscle use, and normal breath sounds.  Heart-- normal rate, regular rhythm, no murmur.  Extremities-- no pretibial edema bilaterally  Neurologic--  alert & oriented X3. Speech normal, gait normal, strength normal in all extremities.   Psych-- Cognition and judgment appear intact. Cooperative with normal attention span and concentration. No anxious or depressed appearing.     Assessment & Plan:

## 2013-12-28 ENCOUNTER — Encounter: Payer: Self-pay | Admitting: Internal Medicine

## 2013-12-28 DIAGNOSIS — R739 Hyperglycemia, unspecified: Secondary | ICD-10-CM | POA: Insufficient documentation

## 2013-12-28 DIAGNOSIS — R7989 Other specified abnormal findings of blood chemistry: Secondary | ICD-10-CM | POA: Insufficient documentation

## 2013-12-28 DIAGNOSIS — E119 Type 2 diabetes mellitus without complications: Secondary | ICD-10-CM | POA: Insufficient documentation

## 2013-12-28 NOTE — Assessment & Plan Note (Signed)
Abnormal TSH, check a TSH, free T3, free T4 in 2 weeks

## 2013-12-28 NOTE — Assessment & Plan Note (Signed)
Mild hyperglycemia, check an A1c in 2 weeks

## 2013-12-28 NOTE — Assessment & Plan Note (Signed)
Hypertension, Needs better control, currently taking Tenoretic 50-25, amlodipine 10 mg, clonidin 0.1 mg only one tablet at bedtime. Plan: Discontinue clonodin Start losartan 100 mg BMP in 2 weeks

## 2014-01-09 ENCOUNTER — Other Ambulatory Visit: Payer: 59

## 2014-01-21 ENCOUNTER — Telehealth: Payer: Self-pay | Admitting: Internal Medicine

## 2014-01-21 DIAGNOSIS — I10 Essential (primary) hypertension: Secondary | ICD-10-CM

## 2014-01-21 DIAGNOSIS — R739 Hyperglycemia, unspecified: Secondary | ICD-10-CM

## 2014-01-21 DIAGNOSIS — R7989 Other specified abnormal findings of blood chemistry: Secondary | ICD-10-CM

## 2014-01-21 NOTE — Telephone Encounter (Signed)
Advise patient, he is due for labs, please arrange: BMP--- dx hypertension  A1c --- dx hyperglycemia  TSH, free T3, free T4--- dx abnormal TSH

## 2014-01-22 NOTE — Telephone Encounter (Signed)
Pt scheduled  

## 2014-01-23 ENCOUNTER — Other Ambulatory Visit (INDEPENDENT_AMBULATORY_CARE_PROVIDER_SITE_OTHER): Payer: 59

## 2014-01-23 DIAGNOSIS — R7989 Other specified abnormal findings of blood chemistry: Secondary | ICD-10-CM

## 2014-01-23 DIAGNOSIS — R739 Hyperglycemia, unspecified: Secondary | ICD-10-CM

## 2014-01-23 DIAGNOSIS — R6889 Other general symptoms and signs: Secondary | ICD-10-CM

## 2014-01-23 DIAGNOSIS — I10 Essential (primary) hypertension: Secondary | ICD-10-CM

## 2014-01-23 DIAGNOSIS — R899 Unspecified abnormal finding in specimens from other organs, systems and tissues: Secondary | ICD-10-CM

## 2014-01-23 LAB — BASIC METABOLIC PANEL
BUN: 11 mg/dL (ref 6–23)
CO2: 30 mEq/L (ref 19–32)
Calcium: 9.1 mg/dL (ref 8.4–10.5)
Chloride: 100 mEq/L (ref 96–112)
Creatinine, Ser: 1 mg/dL (ref 0.4–1.5)
GFR: 102.55 mL/min (ref >60.00)
Glucose, Bld: 118 mg/dL — ABNORMAL HIGH (ref 70–99)
Potassium: 3.4 mEq/L — ABNORMAL LOW (ref 3.5–5.1)
Sodium: 135 mEq/L (ref 135–145)

## 2014-01-23 LAB — T4, FREE: Free T4: 0.87 ng/dL (ref 0.60–1.60)

## 2014-01-23 LAB — T3, FREE: T3, Free: 3.4 pg/mL (ref 2.3–4.2)

## 2014-01-23 LAB — TSH: TSH: 0.28 u[IU]/mL — ABNORMAL LOW (ref 0.35–5.50)

## 2014-01-23 LAB — HEMOGLOBIN A1C: Hgb A1c MFr Bld: 6.1 % (ref 4.6–6.5)

## 2014-02-21 ENCOUNTER — Other Ambulatory Visit: Payer: Self-pay | Admitting: Internal Medicine

## 2014-02-21 DIAGNOSIS — I10 Essential (primary) hypertension: Secondary | ICD-10-CM

## 2014-02-23 NOTE — Telephone Encounter (Signed)
Refill for Losartan sent to [facility] on [address]

## 2014-03-27 ENCOUNTER — Ambulatory Visit: Payer: 59 | Admitting: Internal Medicine

## 2014-03-27 DIAGNOSIS — Z0289 Encounter for other administrative examinations: Secondary | ICD-10-CM

## 2014-06-04 ENCOUNTER — Other Ambulatory Visit: Payer: Self-pay | Admitting: Internal Medicine

## 2014-07-06 ENCOUNTER — Other Ambulatory Visit: Payer: Self-pay | Admitting: Internal Medicine

## 2015-01-19 ENCOUNTER — Other Ambulatory Visit: Payer: Self-pay

## 2015-01-19 MED ORDER — ATENOLOL-CHLORTHALIDONE 50-25 MG PO TABS: 1.0000 | ORAL_TABLET | Freq: Every day | ORAL | Status: DC

## 2015-02-15 ENCOUNTER — Ambulatory Visit (INDEPENDENT_AMBULATORY_CARE_PROVIDER_SITE_OTHER): Payer: 59

## 2015-02-15 ENCOUNTER — Ambulatory Visit (INDEPENDENT_AMBULATORY_CARE_PROVIDER_SITE_OTHER): Payer: 59 | Admitting: Family Medicine

## 2015-02-15 VITALS — BP 142/92 | HR 100 | Temp 98.0°F | Resp 18 | Ht 73.0 in | Wt 207.6 lb

## 2015-02-15 DIAGNOSIS — R05 Cough: Secondary | ICD-10-CM

## 2015-02-15 DIAGNOSIS — R059 Cough, unspecified: Secondary | ICD-10-CM

## 2015-02-15 DIAGNOSIS — J209 Acute bronchitis, unspecified: Secondary | ICD-10-CM | POA: Diagnosis not present

## 2015-02-15 DIAGNOSIS — J029 Acute pharyngitis, unspecified: Secondary | ICD-10-CM

## 2015-02-15 DIAGNOSIS — R062 Wheezing: Secondary | ICD-10-CM | POA: Diagnosis not present

## 2015-02-15 DIAGNOSIS — Z9289 Personal history of other medical treatment: Secondary | ICD-10-CM

## 2015-02-15 DIAGNOSIS — B370 Candidal stomatitis: Secondary | ICD-10-CM | POA: Diagnosis not present

## 2015-02-15 DIAGNOSIS — R7611 Nonspecific reaction to tuberculin skin test without active tuberculosis: Secondary | ICD-10-CM

## 2015-02-15 LAB — POCT RAPID STREP A (OFFICE): Rapid Strep A Screen: NEGATIVE

## 2015-02-15 MED ORDER — FLUCONAZOLE 150 MG PO TABS: 150.0000 mg | ORAL_TABLET | Freq: Once | ORAL | Status: DC

## 2015-02-15 MED ORDER — HYDROCODONE-HOMATROPINE 5-1.5 MG/5ML PO SYRP: 5.0000 mL | ORAL_SOLUTION | Freq: Three times a day (TID) | ORAL | Status: DC | PRN

## 2015-02-15 MED ORDER — AMOXICILLIN-POT CLAVULANATE 875-125 MG PO TABS: 1.0000 | ORAL_TABLET | Freq: Two times a day (BID) | ORAL | Status: DC

## 2015-02-15 NOTE — Patient Instructions (Signed)
Thrush, Adult  Thrush, also called oral candidiasis, is a fungal infection that develops in the mouth and throat and on the tongue. It causes white patches to form on the mouth and tongue. Thrush is most common in older adults, but it can occur at any age.  Many cases of thrush are mild, but this infection can also be more serious. Thrush can be a recurring problem for people who have chronic illnesses or who take medicines that limit the body's ability to fight infection. Because these people have difficulty fighting infections, the fungus that causes thrush can spread throughout the body. This can cause life-threatening blood or organ infections. CAUSES  Thrush is usually caused by a yeast called Candida albicans. This fungus is normally present in small amounts in the mouth and on other mucous membranes. It usually causes no harm. However, when conditions are present that allow the fungus to grow uncontrolled, it invades surrounding tissues and becomes an infection. Less often, other Candida species can also lead to thrush.  RISK FACTORS Thrush is more likely to develop in the following people:  People with an impaired ability to fight infection (weakened immune system).   Older adults.   People with HIV.   People with diabetes.   People with dry mouth (xerostomia).   Pregnant women.   People with poor dental care, especially those who have false teeth.   People who use antibiotic medicines.  SIGNS AND SYMPTOMS  Thrush can be a mild infection that causes no symptoms. If symptoms develop, they may include:   A burning feeling in the mouth and throat. This can occur at the start of a thrush infection.   White patches that adhere to the mouth and tongue. The tissue around the patches may be red, raw, and painful. If rubbed (during tooth brushing, for example), the patches and the tissue of the mouth may bleed easily.   A bad taste in the mouth or difficulty tasting foods.    Cottony feeling in the mouth.   Pain during eating and swallowing. DIAGNOSIS  Your health care provider can usually diagnose thrush by looking in your mouth and asking you questions about your health.  TREATMENT  Medicines that help prevent the growth of fungi (antifungals) are the standard treatment for thrush. These medicines are either applied directly to the affected area (topical) or swallowed (oral). The treatment will depend on the severity of the condition.  Mild Thrush Mild cases of thrush may clear up with the use of an antifungal mouth rinse or lozenges. Treatment usually lasts about 14 days.  Moderate to Severe Thrush  More severe thrush infections that have spread to the esophagus are treated with an oral antifungal medicine. A topical antifungal medicine may also be used.   For some severe infections, a treatment period longer than 14 days may be needed.   Oral antifungal medicines are almost never used during pregnancy because the fetus may be harmed. However, if a pregnant woman has a rare, severe thrush infection that has spread to her blood, oral antifungal medicines may be used. In this case, the risk of harm to the mother and fetus from the severe thrush infection may be greater than the risk posed by the use of antifungal medicines.  Persistent or Recurrent Thrush For cases of thrush that do not go away or keep coming back, treatment may involve the following:   Treatment may be needed twice as long as the symptoms last.   Treatment will   include both oral and topical antifungal medicines.   People with weakened immune systems can take an antifungal medicine on a continuous basis to prevent thrush infections.  It is important to treat conditions that make you more likely to get thrush, such as diabetes or HIV.  HOME CARE INSTRUCTIONS   Only take over-the-counter or prescription medicine as directed by your health care provider. Talk to your health care  provider about an over-the-counter medicine called gentian violet, which kills bacteria and fungi.   Eat plain, unflavored yogurt as directed by your health care provider. Check the label to make sure the yogurt contains live cultures. This yogurt can help healthy bacteria grow in the mouth that can stop the growth of the fungus that causes thrush.   Try these measures to help reduce the discomfort of thrush:   Drink cold liquids such as water or iced tea.   Try flavored ice treats or frozen juices.   Eat foods that are easy to swallow, such as gelatin, ice cream, or custard.   If the patches in your mouth are painful, try drinking from a straw.   Rinse your mouth several times a day with a warm saltwater rinse. You can make the saltwater mixture with 1 tsp (6 g) of salt in 8 fl oz (0.2 L) of warm water.   If you wear dentures, remove the dentures before going to bed, brush them vigorously, and soak them in a cleaning solution as directed by your health care provider.   Women who are breastfeeding should clean their nipples with an antifungal medicine as directed by their health care provider. Dry the nipples after breastfeeding. Applying lanolin-containing body lotion may help relieve nipple soreness.  SEEK MEDICAL CARE IF:  Your symptoms are getting worse or are not improving within 7 days of starting treatment.   You have symptoms of spreading infection, such as white patches on the skin outside of the mouth.   You are nursing and you have redness, burning, or pain in the nipples that is not relieved with treatment.  MAKE SURE YOU:  Understand these instructions.  Will watch your condition.  Will get help right away if you are not doing well or get worse. Document Released: 08/29/2004 Document Revised: 09/24/2013 Document Reviewed: 07/07/2013 [facility] Patient Information 2015 [place], [place]. This information is not intended to replace advice given to you by your  health care provider. Make sure you discuss any questions you have with your health care provider. Acute Bronchitis Bronchitis is inflammation of the airways that extend from the windpipe into the lungs (bronchi). The inflammation often causes mucus to develop. This leads to a cough, which is the most common symptom of bronchitis.  In acute bronchitis, the condition usually develops suddenly and goes away over time, usually in a couple weeks. Smoking, allergies, and asthma can make bronchitis worse. Repeated episodes of bronchitis may cause further lung problems.  CAUSES Acute bronchitis is most often caused by the same virus that causes a cold. The virus can spread from person to person (contagious) through coughing, sneezing, and touching contaminated objects. SIGNS AND SYMPTOMS   Cough.   Fever.   Coughing up mucus.   Body aches.   Chest congestion.   Chills.   Shortness of breath.   Sore throat.  DIAGNOSIS  Acute bronchitis is usually diagnosed through a physical exam. Your health care provider will also ask you questions about your medical history. Tests, such as chest X-rays, are sometimes done  to rule out other conditions.  TREATMENT  Acute bronchitis usually goes away in a couple weeks. Oftentimes, no medical treatment is necessary. Medicines are sometimes given for relief of fever or cough. Antibiotic medicines are usually not needed but may be prescribed in certain situations. In some cases, an inhaler may be recommended to help reduce shortness of breath and control the cough. A cool mist vaporizer may also be used to help thin bronchial secretions and make it easier to clear the chest.  HOME CARE INSTRUCTIONS  Get plenty of rest.   Drink enough fluids to keep your urine clear or pale yellow (unless you have a medical condition that requires fluid restriction). Increasing fluids may help thin your respiratory secretions (sputum) and reduce chest congestion, and it  will prevent dehydration.   Take medicines only as directed by your health care provider.  If you were prescribed an antibiotic medicine, finish it all even if you start to feel better.  Avoid smoking and secondhand smoke. Exposure to cigarette smoke or irritating chemicals will make bronchitis worse. If you are a smoker, consider using nicotine gum or skin patches to help control withdrawal symptoms. Quitting smoking will help your lungs heal faster.   Reduce the chances of another bout of acute bronchitis by washing your hands frequently, avoiding people with cold symptoms, and trying not to touch your hands to your mouth, nose, or eyes.   Keep all follow-up visits as directed by your health care provider.  SEEK MEDICAL CARE IF: Your symptoms do not improve after 1 week of treatment.  SEEK IMMEDIATE MEDICAL CARE IF:  You develop an increased fever or chills.   You have chest pain.   You have severe shortness of breath.  You have bloody sputum.   You develop dehydration.  You faint or repeatedly feel like you are going to pass out.  You develop repeated vomiting.  You develop a severe headache. MAKE SURE YOU:   Understand these instructions.  Will watch your condition.  Will get help right away if you are not doing well or get worse. Document Released: 01/11/2005 Document Revised: 04/20/2014 Document Reviewed: 05/27/2013 [facility] Patient Information 2015 [place], [place]. This information is not intended to replace advice given to you by your health care provider. Make sure you discuss any questions you have with your health care provider.

## 2015-02-15 NOTE — Progress Notes (Signed)
Chief Complaint:  Chief Complaint  Patient presents with  . Sore Throat    x 3 weeks, progressively gotten worse. Pt unable to eat  . Cough    Productive, x 3weeks, progressively gotten worse    HPI: Mike Shepard is a 49 y.o. male who is here for 4 week hx of cough which he could self treat, he now has productive brownish green, has had a hard time swallowing due to sore throat from coughing, he has not had fevers or chills. + Wheeze with cough, is a smoker. Gets bronchitis yearly, He denies n/v/mask aches, just feels tired, has tried otc meds without releif. Cannot sleep due to cough. Has not been able to smoke. His wife Mike Shepard wants him to get checked out and he can't go home if he does not. He deneis any night sweats or weight loss, has a hx of + PPD s/p tratment, deneis any TB likes sxs, He was previously on subtex, he denies any substance abuse issues currently He was seeing Dr Mike Shepard for his HTN but has not been back because they moved,s till taking medsication, but will try to follow-up with them, they are now at [facility] and he will probably go back to them for one more time and then find a doctor who is closer to monitor his BP meds and labs  Past Medical History  Diagnosis Date  . Hypertension   . GERD (gastroesophageal reflux disease)   . Hyperlipidemia   . Chronic back pain     see surgeries   . PPD positive     Positive PPD, remotely ~2000, s/p Rx    . H/O: gout    Past Surgical History  Procedure Laterality Date  . Back surgery      2007-2013 (low back)  . Neck surgery      2010   History   Social History  . Marital Status: Married    Spouse Name: N/A  . Number of Children: 3  . Years of Education: N/A   Occupational History  . city of [place] Unemployed   Social History Main Topics  . Smoking status: Current Every Day Smoker    Types: Cigarettes    Last Attempt to Quit: 01/27/2010  . Smokeless tobacco: Never Used  . Alcohol Use: 1.2 oz/week   1 Glasses of wine, 1 Cans of beer per week     Comment: socially   . Drug Use: No     Comment: denies   . Sexual Activity: Yes    Birth Control/ Protection: None   Other Topics Concern  . None   Social History Narrative   Family History  Problem Relation Age of Onset  . Hypertension Mother     M,F,Sis, bro  . Diabetes Father     F, M. B  . Anesthesia problems Neg Hx   . Colon cancer Neg Hx   . Prostate cancer Other     PGF, dx ~ 69  . Diabetes Sister   . Hypertension Sister   . Diabetes Brother   . Hypertension Brother    No Known Allergies Prior to Admission medications   Medication Sig Start Date End Date Taking? Authorizing Provider  amLODipine (NORVASC) 10 MG tablet TAKE ONE TABLET BY MOUTH ONCE DAILY   Yes Colon Branch, MD  atenolol-chlorthalidone (TENORETIC) 50-25 MG per tablet Take 1 tablet by mouth daily. OVERDUE FOR APPT WITH DR. PAZ. NO FURTHER REFILLS. [id number]. 01/19/15  Yes Colon Branch, MD  lansoprazole (PREVACID) 15 MG capsule Take 15 mg by mouth daily as needed. For acid reflux.   Yes Historical Provider, MD  Multiple Vitamin (MULTIVITAMIN WITH MINERALS) TABS Take 1 tablet by mouth daily.   Yes Historical Provider, MD  amoxicillin (AMOXIL) 875 MG tablet Take 1 tablet (875 mg total) by mouth 2 (two) times daily. Patient not taking: Reported on 02/15/2015 12/22/13   Mike Chessman, DO  buprenorphine (SUBUTEX) 8 MG SUBL SL tablet  10/21/13   Historical Provider, MD  fluticasone (FLONASE) 50 MCG/ACT nasal spray Place 2 sprays into the nose daily. 07/19/12 12/22/13  Colon Branch, MD  losartan (COZAAR) 100 MG tablet TAKE ONE TABLET BY MOUTH ONCE DAILY Patient not taking: Reported on 02/15/2015 07/06/14   Colon Branch, MD     ROS: The patient denies fevers, chills, night sweats, unintentional weight loss, chest pain, palpitations, dyspnea on exertion, nausea, vomiting, abdominal pain, dysuria, hematuria, melena, numbness, weakness, or tingling.   All other systems have been reviewed  and were otherwise negative with the exception of those mentioned in the HPI and as above.    PHYSICAL EXAM: Filed Vitals:   02/15/15 1349  BP: 142/92  Pulse: 100  Temp: 98 F (36.7 C)  Resp: 18   SpO2 Readings from Last 3 Encounters:  02/15/15 98%  12/26/13 100%  12/22/13 97%    Filed Vitals:   02/15/15 1349  Height: 6\' 1"  (1.854 m)  Weight: 207 lb 9.6 oz (94.167 kg)   Body mass index is 27.4 kg/(m^2).  General: Alert, no acute distress HEENT:  Normocephalic, atraumatic, oropharynx patent. EOMI, PERRLA, + thrush Erythematous throat, no exudates, TM normal, + sinus tenderness, + erythematous/boggy nasal mucosa Cardiovascular:  Regular rate and rhythm, no rubs murmurs or gallops.  No Carotid bruits, radial pulse intact. No pedal edema.  Respiratory: Clear to auscultation bilaterally.  No wheezes, rales, or rhonchi.  No cyanosis, no use of accessory musculature GI: No organomegaly, abdomen is soft and non-tender, positive bowel sounds.  No masses. Skin: No rashes. Neurologic: Facial musculature symmetric. Psychiatric: Patient is appropriate throughout our interaction. Lymphatic: No cervical lymphadenopathy Musculoskeletal: Gait intact.   LABS: Results for orders placed or performed in visit on 02/15/15  POCT rapid strep A  Result Value Ref Range   Rapid Strep A Screen Negative Negative     EKG/XRAY:   Primary read interpreted by Dr. Marin Comment at [facility]. Please comment if there is an abnormal opacity on lateral view   ASSESSMENT/PLAN: Encounter Diagnoses  Name Primary?  . Cough Yes  . Sore throat   . Wheezing   . Acute bronchitis, unspecified organism   . Thrush   . Acute pharyngitis, unspecified pharyngitis type    Augmentin, Hycodan, Diflucan ( if thrush does not resolve consider hsytatin swish and swallow, will wait to see if improved with meds before rx solution) otc daytime cough medicine ie mucines or delsyn prn Currently not wheezing, he states he does not  need it, only 1-2 times during coughing spells Ocean spray or nasocort prn  Work note given, advise to quit smoking Fu prn    Gross sideeffects, risk and benefits, and alternatives of medications d/w patient. Patient is aware that all medications have potential sideeffects and we are unable to predict every sideeffect or drug-drug interaction that may occur.  Michella Detjen, [place], DO 02/15/2015 2:39 PM

## 2015-02-17 ENCOUNTER — Other Ambulatory Visit: Payer: Self-pay | Admitting: Internal Medicine

## 2015-02-17 LAB — CULTURE, GROUP A STREP: Organism ID, Bacteria: NORMAL

## 2015-02-18 ENCOUNTER — Encounter: Payer: Self-pay | Admitting: *Deleted

## 2015-03-16 ENCOUNTER — Telehealth: Payer: Self-pay | Admitting: Internal Medicine

## 2015-03-16 ENCOUNTER — Encounter: Payer: Self-pay | Admitting: Internal Medicine

## 2015-03-16 ENCOUNTER — Other Ambulatory Visit: Payer: Self-pay

## 2015-03-16 MED ORDER — ATENOLOL-CHLORTHALIDONE 50-25 MG PO TABS: 1.0000 | ORAL_TABLET | Freq: Every day | ORAL | Status: DC

## 2015-03-16 NOTE — Telephone Encounter (Signed)
Mike Shepard DOB: [birth year] Pt needs a refill on his BP meds He uses the [facility] on [address] in [facility] [phone]  Patient promises that he will return on tomorrow, 03-16-15, for his appt.  Amber N[employer]

## 2015-03-16 NOTE — Addendum Note (Signed)
Addended by: Wilfrid Lund on: 03/16/2015 02:58 PM   Modules accepted: Orders

## 2015-03-16 NOTE — Telephone Encounter (Signed)
Atenolol sent to [facility] [facility] (# 30 and 0RF).

## 2015-03-17 ENCOUNTER — Ambulatory Visit (INDEPENDENT_AMBULATORY_CARE_PROVIDER_SITE_OTHER): Payer: 59 | Admitting: Internal Medicine

## 2015-03-17 ENCOUNTER — Other Ambulatory Visit: Payer: Self-pay

## 2015-03-17 ENCOUNTER — Telehealth: Payer: Self-pay | Admitting: Internal Medicine

## 2015-03-17 ENCOUNTER — Encounter: Payer: Self-pay | Admitting: Internal Medicine

## 2015-03-17 VITALS — BP 138/92 | HR 82 | Temp 98.0°F | Ht 73.0 in | Wt 210.1 lb

## 2015-03-17 DIAGNOSIS — I10 Essential (primary) hypertension: Secondary | ICD-10-CM

## 2015-03-17 DIAGNOSIS — K219 Gastro-esophageal reflux disease without esophagitis: Secondary | ICD-10-CM

## 2015-03-17 DIAGNOSIS — R739 Hyperglycemia, unspecified: Secondary | ICD-10-CM | POA: Diagnosis not present

## 2015-03-17 MED ORDER — AMLODIPINE BESYLATE 10 MG PO TABS: 10.0000 mg | ORAL_TABLET | Freq: Every day | ORAL | Status: DC

## 2015-03-17 MED ORDER — ATENOLOL 50 MG PO TABS: 50.0000 mg | ORAL_TABLET | Freq: Every day | ORAL | Status: DC

## 2015-03-17 MED ORDER — LANSOPRAZOLE 15 MG PO CPDR: 15.0000 mg | DELAYED_RELEASE_CAPSULE | Freq: Every day | ORAL | Status: DC

## 2015-03-17 NOTE — Telephone Encounter (Signed)
emmi mailed  °

## 2015-03-17 NOTE — Assessment & Plan Note (Signed)
Has prediabetes, patient aware of diagnosis, recommend a healthy diet and exercise

## 2015-03-17 NOTE — Progress Notes (Signed)
Subjective:    Patient ID: Mike Shepard, male    DOB: Nov 27, 1966, 49 y.o.   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 He is not taking losartan only for the last couple of days, his BP is still acceptable. GERD symptoms well controlled, would like a prescription for Prevacid   Review of Systems Denies chest pain or difficulty breathing No nausea, vomiting, diarrhea  Past Medical History  Diagnosis Date  . Hypertension   . GERD (gastroesophageal reflux disease)   . Hyperlipidemia   . Chronic back pain     see surgeries   . PPD positive     Positive PPD, remotely ~2000, s/p Rx    . H/O: gout     Past Surgical History  Procedure Laterality Date  . Back surgery      2007-2013 (low back)  . Neck surgery      2010    History   Social History  . Marital Status: Married    Spouse Name: N/A  . Number of Children: 3  . Years of Education: N/A   Occupational History  . city of [place] Unemployed   Social History Main Topics  . Smoking status: Current Every Day Smoker    Types: Cigarettes    Last Attempt to Quit: 01/27/2010  . Smokeless tobacco: Never Used  . Alcohol Use: 1.2 oz/week    1 Glasses of wine, 1 Cans of beer per week     Comment: socially   . Drug Use: No     Comment: denies   . Sexual Activity: Yes    Birth Control/ Protection: None   Other Topics Concern  . Not on file   Social History Narrative        Medication List       This list is accurate as of: 03/17/15  2:47 PM.  Always use your most recent med list.               amLODipine 10 MG tablet  Commonly known as:  NORVASC  TAKE ONE TABLET BY MOUTH ONCE DAILY     atenolol-chlorthalidone 50-25 MG per tablet  Commonly known as:  TENORETIC  Take 1 tablet by  mouth daily.     fluticasone 50 MCG/ACT nasal spray  Commonly known as:  FLONASE  Place 2 sprays into the nose daily.     HYDROcodone-homatropine 5-1.5 MG/5ML syrup  Commonly known as:  HYCODAN  Take 5 mLs by mouth every 8 (eight) hours as needed for cough.     losartan 100 MG tablet  Commonly known as:  COZAAR  TAKE ONE TABLET BY MOUTH ONCE DAILY     multivitamin with minerals Tabs tablet  Take 1 tablet by mouth daily.     PREVACID 15 MG capsule  Generic drug:  lansoprazole  Take 15 mg by mouth daily as needed. OTC           Objective:   Physical Exam BP 138/92 mmHg  Pulse 82  Temp(Src) 98 F (36.7 C) (Oral)  Ht 6\' 1"  (1.854 m)  Wt 210 lb 2 oz (95.312 kg)  BMI 27.73 kg/m2  SpO2 98% General:   Well developed, well nourished . NAD.  HEENT:  Normocephalic . Face symmetric, atraumatic Lungs:  CTA B Normal respiratory effort, no intercostal retractions, no accessory muscle use. Heart: RRR,  no murmur.  Muscle skeletal: no pretibial edema bilaterally  Skin: Not pale. Not jaundice Neurologic:  alert & oriented X3.  Speech normal, gait appropriate for age and unassisted Psych--  Cognition and judgment appear intact.  Cooperative with normal attention span and concentration.  Behavior appropriate. No anxious or depressed appearing.      Assessment & Plan:

## 2015-03-17 NOTE — Assessment & Plan Note (Signed)
Rx written for Prevacid

## 2015-03-17 NOTE — Progress Notes (Signed)
Pre visit review using our clinic review tool, if applicable. No additional management support is needed unless otherwise documented below in the visit note. 

## 2015-03-17 NOTE — Patient Instructions (Signed)
  Please schedule labs to be done in 2 weeks (BMP-- HTN)  Check the  blood pressure  weekly  Be sure your blood pressure is between 110/65 and  145/85.  if it is consistently higher or lower, let me know     Come back to the office in 3-4 months   for a physical exam  Please schedule an appointment at the front desk    Come back fasting

## 2015-03-17 NOTE — Assessment & Plan Note (Addendum)
BP was in the low side which was taking losartan, atenolol, chlorthalidone and amlodipine. 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 Plan:  Discontinue chlorthalidone, continue other medications, check a BMP in 2 weeks and monitor BPs. Next visit in 4 months for a fasting physical exam.

## 2015-04-02 ENCOUNTER — Other Ambulatory Visit: Payer: 59

## 2015-06-11 ENCOUNTER — Telehealth: Payer: Self-pay | Admitting: Internal Medicine

## 2015-06-11 NOTE — Telephone Encounter (Signed)
pre visit letter mailed 06/11/15

## 2015-06-29 ENCOUNTER — Other Ambulatory Visit: Payer: Self-pay

## 2015-06-29 MED ORDER — LOSARTAN POTASSIUM 100 MG PO TABS: 100.0000 mg | ORAL_TABLET | Freq: Every day | ORAL | Status: DC

## 2015-07-01 ENCOUNTER — Encounter: Payer: Self-pay | Admitting: *Deleted

## 2015-07-01 ENCOUNTER — Telehealth: Payer: Self-pay | Admitting: *Deleted

## 2015-07-01 NOTE — Addendum Note (Signed)
Addended by: Leticia Penna A on: 07/01/2015 05:05 PM   Modules accepted: Medications

## 2015-07-01 NOTE — Telephone Encounter (Signed)
Pre-Visit Call completed with patient and chart updated.   Pre-Visit Info documented in Specialty Comments under [employer].    

## 2015-07-01 NOTE — Telephone Encounter (Signed)
Unable to reach patient at time of Pre-Visit Call.  Left message for patient to return call when available.    

## 2015-07-02 ENCOUNTER — Encounter: Payer: Self-pay | Admitting: Internal Medicine

## 2015-07-02 ENCOUNTER — Ambulatory Visit (INDEPENDENT_AMBULATORY_CARE_PROVIDER_SITE_OTHER): Payer: 59 | Admitting: Internal Medicine

## 2015-07-02 VITALS — BP 128/80 | HR 53 | Temp 98.3°F | Ht 73.0 in | Wt 212.4 lb

## 2015-07-02 DIAGNOSIS — R7303 Prediabetes: Secondary | ICD-10-CM

## 2015-07-02 DIAGNOSIS — Z0000 Encounter for general adult medical examination without abnormal findings: Secondary | ICD-10-CM | POA: Diagnosis not present

## 2015-07-02 DIAGNOSIS — M25551 Pain in right hip: Secondary | ICD-10-CM | POA: Diagnosis not present

## 2015-07-02 DIAGNOSIS — Z23 Encounter for immunization: Secondary | ICD-10-CM

## 2015-07-02 MED ORDER — ATENOLOL 50 MG PO TABS: 50.0000 mg | ORAL_TABLET | Freq: Every day | ORAL | Status: DC

## 2015-07-02 MED ORDER — AMLODIPINE BESYLATE 10 MG PO TABS: 10.0000 mg | ORAL_TABLET | Freq: Every day | ORAL | Status: DC

## 2015-07-02 MED ORDER — LANSOPRAZOLE 15 MG PO CPDR: 15.0000 mg | DELAYED_RELEASE_CAPSULE | Freq: Every day | ORAL | Status: DC

## 2015-07-02 MED ORDER — LOSARTAN POTASSIUM 100 MG PO TABS: 100.0000 mg | ORAL_TABLET | Freq: Every day | ORAL | Status: DC

## 2015-07-02 NOTE — Assessment & Plan Note (Addendum)
Td 2016 Never had a cscope + FH prostate cancer, GF age 49, DRE (-) today, check a PSA  Labs  Diet-exercise discussed   multiple other issues discussed.

## 2015-07-02 NOTE — Patient Instructions (Signed)
   Please schedule labs to be done within few days (fasting)   

## 2015-07-02 NOTE — Progress Notes (Signed)
Subjective:    Patient ID: Mike Shepard, male    DOB: Nov 25, 1966, 49 y.o.   MRN: [id number]  DOS:  07/02/2015 Type of visit - description : Complete physical exam Interval history: Having some concerns, see review of systems   Review of Systems  Reports on-off right hip pain for several months, currently having pain all day long 3 to 4 days out  of each week, worse by walking, located at the lateral aspect of the hip, occasionally radiates down to the site of the thigh. Also 1 year history of decreased sex drive, no problems with erectile dysfunction, no anxiety depression or problems with his wife. Also 6 months history of some fatigue. Denies snoring or feeling sleepy.  Constitutional: No fever. No chills. No unexplained wt changes. No unusual sweats  HEENT: No dental problems, no ear discharge, no facial swelling, no voice changes. No eye discharge, no eye  redness , no  intolerance to light   Respiratory: No wheezing , no  difficulty breathing. No cough , no mucus production  Cardiovascular: No CP, no leg swelling , no  Palpitations  GI: no nausea, no vomiting, no diarrhea , no  abdominal pain.  No blood in the stools. No dysphagia, no odynophagia    Endocrine: No polyphagia, no polyuria , no polydipsia  GU: No dysuria, gross hematuria, difficulty urinating. No urinary urgency, no frequency.  Musculoskeletal: See above  Skin: No change in the color of the skin, palor , no  Rash  Allergic, immunologic: No environmental allergies , no  food allergies  Neurological: No dizziness no  syncope. No headaches. No diplopia, no slurred, no slurred speech, no motor deficits, no facial  Numbness  Hematological: No enlarged lymph nodes, no easy bruising , no unusual bleedings  Psychiatry: No suicidal ideas, no hallucinations, no beavior problems, no confusion.  No unusual/severe anxiety, no depression    . Past Medical History  Diagnosis Date  . Hypertension   . GERD  (gastroesophageal reflux disease)   . Hyperlipidemia   . Chronic back pain     see surgeries   . PPD positive     Positive PPD, remotely ~2000, s/p Rx    . H/O: gout     Past Surgical History  Procedure Laterality Date  . Back surgery      2007-2013 (low back)  . Neck surgery      2010    History   Social History  . Marital Status: Married    Spouse Name: N/A  . Number of Children: 3  . Years of Education: N/A   Occupational History  . city of [place]    Social History Main Topics  . Smoking status: Former Smoker    Types: Cigarettes    Quit date: 01/27/2010  . Smokeless tobacco: Never Used     Comment: quit ~ 12-2014, vaping some   . Alcohol Use: 1.2 oz/week    1 Glasses of wine, 1 Cans of beer per week     Comment: socially   . Drug Use: No     Comment: denies   . Sexual Activity: Yes    Birth Control/ Protection: None   Other Topics Concern  . Not on file   Social History Narrative   Lives w/ wife, 3 children plus adopted 3 children     Family History  Problem Relation Age of Onset  . Hypertension Mother     M,F,Sis, bro  . Diabetes Father  F, M. B  . Anesthesia problems Neg Hx   . Colon cancer Neg Hx   . Prostate cancer Other     PGF, dx ~ 69  . Diabetes Sister   . Hypertension Sister   . Diabetes Brother   . Hypertension Brother        Medication List       This list is accurate as of: 07/02/15 11:59 PM.  Always use your most recent med list.               amLODipine 10 MG tablet  Commonly known as:  NORVASC  Take 1 tablet (10 mg total) by mouth daily.     atenolol 50 MG tablet  Commonly known as:  TENORMIN  Take 1 tablet (50 mg total) by mouth daily.     fluticasone 50 MCG/ACT nasal spray  Commonly known as:  FLONASE  Place 2 sprays into the nose daily.     lansoprazole 15 MG capsule  Commonly known as:  PREVACID  Take 1 capsule (15 mg total) by mouth daily.     losartan 100 MG tablet  Commonly known as:  COZAAR    Take 1 tablet (100 mg total) by mouth daily.     multivitamin with minerals Tabs tablet  Take 1 tablet by mouth daily.           Objective:   Physical Exam BP 128/80 mmHg  Pulse 53  Temp(Src) 98.3 F (36.8 C) (Oral)  Ht 6\' 1"  (1.854 m)  Wt 212 lb 6 oz (96.333 kg)  BMI 28.03 kg/m2  SpO2 99%  General:   Well developed, well nourished . NAD.  Neck:  Full range of motion. Supple. No  thyromegaly HEENT:  Normocephalic . Face symmetric, atraumatic Lungs:  CTA B Normal respiratory effort, no intercostal retractions, no accessory muscle use. Heart: RRR,  no murmur.  No pretibial edema bilaterally  Abdomen:  Not distended, soft, non-tender. No rebound or rigidity.  MSK: Left hip normal Right hip: Moderate pain noted with range of motion, not tender at the trochanteric bursa. Rectal:  External abnormalities: none. Normal sphincter tone. No rectal masses or tenderness.  No stools found Prostate: Prostate gland firm and smooth, no enlargement, nodularity, tenderness, mass, asymmetry or induration.  Skin: Exposed areas without rash. Not pale. Not jaundice Neurologic:  alert & oriented X3.  Speech normal, gait appropriate for age and unassisted Strength symmetric and appropriate for age.  Psych: Cognition and judgment appear intact.  Cooperative with normal attention span and concentration.  Behavior appropriate. No anxious or depressed appearing.       Assessment & Plan:     hip pain: seems to be significant. Refer to ortho  Hypertension: good compliance w/ medication, no change  Prediabetes: Recheck A1c, diet and exercise discussed   Fatigue and decreased libido: Check a testosterone level Epworth sleepiness scale is 10, slightly positive, discussed options: Home sleep study versus observation. Elected observation  BMP, AST, ALT, FLP, CBC, A1c, PSA, testosterone, P79 and folic acid

## 2015-07-02 NOTE — Progress Notes (Signed)
Pre visit review using our clinic review tool, if applicable. No additional management support is needed unless otherwise documented below in the visit note. 

## 2015-07-04 NOTE — Addendum Note (Signed)
Addended by: Kathlene November E on: 07/04/2015 11:19 AM   Modules accepted: Orders, SmartSet

## 2015-07-09 ENCOUNTER — Other Ambulatory Visit: Payer: 59

## 2015-07-15 ENCOUNTER — Other Ambulatory Visit (INDEPENDENT_AMBULATORY_CARE_PROVIDER_SITE_OTHER): Payer: 59

## 2015-07-15 DIAGNOSIS — R7989 Other specified abnormal findings of blood chemistry: Secondary | ICD-10-CM | POA: Diagnosis not present

## 2015-07-15 DIAGNOSIS — R7303 Prediabetes: Secondary | ICD-10-CM

## 2015-07-15 DIAGNOSIS — Z0000 Encounter for general adult medical examination without abnormal findings: Secondary | ICD-10-CM | POA: Diagnosis not present

## 2015-07-15 DIAGNOSIS — R7309 Other abnormal glucose: Secondary | ICD-10-CM

## 2015-07-15 LAB — CBC WITH DIFFERENTIAL/PLATELET
Basophils Absolute: 0 10*3/uL (ref 0.0–0.1)
Basophils Relative: 0.3 % (ref 0.0–3.0)
Eosinophils Absolute: 0.2 10*3/uL (ref 0.0–0.7)
Eosinophils Relative: 2.9 % (ref 0.0–5.0)
HCT: 43.2 % (ref 39.0–52.0)
Hemoglobin: 14.6 g/dL (ref 13.0–17.0)
Lymphocytes Relative: 44.5 % (ref 12.0–46.0)
Lymphs Abs: 3.6 10*3/uL (ref 0.7–4.0)
MCHC: 33.7 g/dL (ref 30.0–36.0)
MCV: 87.7 fl (ref 78.0–100.0)
Monocytes Absolute: 0.5 10*3/uL (ref 0.1–1.0)
Monocytes Relative: 5.9 % (ref 3.0–12.0)
Neutro Abs: 3.7 10*3/uL (ref 1.4–7.7)
Neutrophils Relative %: 46.4 % (ref 43.0–77.0)
Platelets: 279 10*3/uL (ref 150.0–400.0)
RBC: 4.93 Mil/uL (ref 4.22–5.81)
RDW: 13 % (ref 11.5–15.5)
WBC: 8.1 10*3/uL (ref 4.0–10.5)

## 2015-07-15 LAB — BASIC METABOLIC PANEL
BUN: 18 mg/dL (ref 6–23)
CO2: 25 mEq/L (ref 19–32)
Calcium: 9.3 mg/dL (ref 8.4–10.5)
Chloride: 106 mEq/L (ref 96–112)
Creatinine, Ser: 1.14 mg/dL (ref 0.40–1.50)
GFR: 87.62 mL/min (ref >60.00)
Glucose, Bld: 121 mg/dL — ABNORMAL HIGH (ref 70–99)
Potassium: 4.1 mEq/L (ref 3.5–5.1)
Sodium: 139 mEq/L (ref 135–145)

## 2015-07-15 LAB — FOLATE: Folate: 10.5 ng/mL (ref >5.9)

## 2015-07-15 LAB — AST: AST: 12 U/L (ref 0–37)

## 2015-07-15 LAB — LIPID PANEL
Cholesterol: 221 mg/dL — ABNORMAL HIGH (ref 0–200)
HDL: 30.7 mg/dL — ABNORMAL LOW (ref >39.00)
NonHDL: 189.88
Total CHOL/HDL Ratio: 7
Triglycerides: 243 mg/dL — ABNORMAL HIGH (ref 0.0–149.0)
VLDL: 48.6 mg/dL — ABNORMAL HIGH (ref 0.0–40.0)

## 2015-07-15 LAB — VITAMIN B12: Vitamin B-12: 487 pg/mL (ref 211–911)

## 2015-07-15 LAB — ALT: ALT: 19 U/L (ref 0–53)

## 2015-07-15 LAB — LDL CHOLESTEROL, DIRECT: Direct LDL: 149 mg/dL

## 2015-07-15 LAB — PSA: PSA: 1.61 ng/mL (ref 0.10–4.00)

## 2015-07-15 LAB — TESTOSTERONE: Testosterone: 245.44 ng/dL — ABNORMAL LOW (ref 300.00–890.00)

## 2015-07-15 LAB — HEMOGLOBIN A1C: Hgb A1c MFr Bld: 5.6 % (ref 4.6–6.5)

## 2015-07-16 LAB — SEX HORMONE BINDING GLOBULIN: Sex Hormone Binding: 19 nmol/L (ref 10–50)

## 2015-07-16 LAB — TESTOSTERONE, % FREE

## 2015-07-20 LAB — TESTOSTERONE, FREE, TOTAL, SHBG
Sex Hormone Binding: 19 nmol/L (ref 10–50)
Testosterone, Free: 66.6 pg/mL (ref 47.0–244.0)
Testosterone-% Free: 2.6 % (ref 1.6–2.9)
Testosterone: 259 ng/dL — ABNORMAL LOW (ref 300–890)

## 2015-11-05 ENCOUNTER — Ambulatory Visit (INDEPENDENT_AMBULATORY_CARE_PROVIDER_SITE_OTHER): Payer: 59 | Admitting: Internal Medicine

## 2015-11-05 ENCOUNTER — Encounter: Payer: Self-pay | Admitting: Internal Medicine

## 2015-11-05 ENCOUNTER — Encounter (INDEPENDENT_AMBULATORY_CARE_PROVIDER_SITE_OTHER): Payer: Self-pay

## 2015-11-05 VITALS — BP 128/76 | HR 80 | Temp 97.8°F | Ht 73.0 in | Wt 205.4 lb

## 2015-11-05 DIAGNOSIS — M1610 Unilateral primary osteoarthritis, unspecified hip: Secondary | ICD-10-CM

## 2015-11-05 DIAGNOSIS — Z09 Encounter for follow-up examination after completed treatment for conditions other than malignant neoplasm: Secondary | ICD-10-CM

## 2015-11-05 DIAGNOSIS — E049 Nontoxic goiter, unspecified: Secondary | ICD-10-CM

## 2015-11-05 DIAGNOSIS — I10 Essential (primary) hypertension: Secondary | ICD-10-CM

## 2015-11-05 DIAGNOSIS — E010 Iodine-deficiency related diffuse (endemic) goiter: Secondary | ICD-10-CM

## 2015-11-05 LAB — BASIC METABOLIC PANEL
BUN: 13 mg/dL (ref 6–23)
CO2: 25 mEq/L (ref 19–32)
Calcium: 9.8 mg/dL (ref 8.4–10.5)
Chloride: 104 mEq/L (ref 96–112)
Creatinine, Ser: 1.16 mg/dL (ref 0.40–1.50)
GFR: 85.77 mL/min (ref >60.00)
Glucose, Bld: 135 mg/dL — ABNORMAL HIGH (ref 70–99)
Potassium: 4.2 mEq/L (ref 3.5–5.1)
Sodium: 138 mEq/L (ref 135–145)

## 2015-11-05 LAB — T3, FREE: T3, Free: 3.2 pg/mL (ref 2.3–4.2)

## 2015-11-05 LAB — T4, FREE: Free T4: 1.07 ng/dL (ref 0.60–1.60)

## 2015-11-05 LAB — TSH: TSH: 0.79 u[IU]/mL (ref 0.35–4.50)

## 2015-11-05 MED ORDER — CELECOXIB 200 MG PO CAPS: 200.0000 mg | ORAL_CAPSULE | Freq: Every day | ORAL | Status: DC

## 2015-11-05 MED ORDER — CELECOXIB 200 MG PO CAPS: 200.0000 mg | ORAL_CAPSULE | Freq: Two times a day (BID) | ORAL | Status: DC

## 2015-11-05 NOTE — Progress Notes (Signed)
Pre visit review using our clinic review tool, if applicable. No additional management support is needed unless otherwise documented below in the visit note. 

## 2015-11-05 NOTE — Progress Notes (Signed)
Subjective:    Patient ID: Mike Shepard, male    DOB: [birth year], 49 y.o.   MRN: [id number]  DOS:  11/05/2015 Type of visit - description : Follow-up Interval history:  His chief complaint today is right hip pain, he was seen by orthopedic surgery, prescribe a hip replacement but that will happen in few months. In the meantime he is taking tramadol 4 times a day but still hurting significantly.   Otherwise good compliance with medications.   Review of Systems   Past Medical History  Diagnosis Date  . Hypertension   . GERD (gastroesophageal reflux disease)   . Hyperlipidemia   . Chronic back pain     see surgeries   . PPD positive     Positive PPD, remotely ~2000, s/p Rx    . H/O: gout     Past Surgical History  Procedure Laterality Date  . Back surgery      2007-2013 (low back)  . Neck surgery      2010    Social History   Social History  . Marital Status: Married    Spouse Name: N/A  . Number of Children: 3  . Years of Education: N/A   Occupational History  . city of [place] ([employer]    Social History Main Topics  . Smoking status: Former Smoker    Types: Cigarettes    Quit date: 01/27/2010  . Smokeless tobacco: Never Used     Comment: quit ~ 12-2014, vaping some   . Alcohol Use: 1.2 oz/week    1 Glasses of wine, 1 Cans of beer per week     Comment: socially   . Drug Use: No     Comment: denies   . Sexual Activity: Yes    Birth Control/ Protection: None   Other Topics Concern  . Not on file   Social History Narrative   Lives w/ wife, 3 children plus adopted 3 children        Medication List       This list is accurate as of: 11/05/15 11:59 PM.  Always use your most recent med list.               amLODipine 10 MG tablet  Commonly known as:  NORVASC  Take 1 tablet (10 mg total) by mouth daily.     atenolol 50 MG tablet  Commonly known as:  TENORMIN  Take 1 tablet (50 mg total) by mouth daily.     celecoxib 200 MG capsule    Commonly known as:  CELEBREX  Take 1 capsule (200 mg total) by mouth daily.     fluticasone 50 MCG/ACT nasal spray  Commonly known as:  FLONASE  Place 2 sprays into the nose daily.     lansoprazole 15 MG capsule  Commonly known as:  PREVACID  Take 1 capsule (15 mg total) by mouth daily.     losartan 100 MG tablet  Commonly known as:  COZAAR  Take 1 tablet (100 mg total) by mouth daily.     multivitamin with minerals Tabs tablet  Take 1 tablet by mouth daily.     traMADol 50 MG tablet  Commonly known as:  ULTRAM  Take by mouth every 6 (six) hours as needed.           Objective:   Physical Exam BP 128/76 mmHg  Pulse 80  Temp(Src) 97.8 F (36.6 C) (Oral)  Ht 6\' 1"  (1.854 m)  Wt 205 lb  6 oz (93.157 kg)  BMI 27.10 kg/m2  SpO2 98% General:   Well developed, well nourished . NAD.  HEENT:  Normocephalic . Face symmetric, atraumatic  Neck: Mild thyromegaly? Mostly on the right side, not nodular or tender. Neurologic:  alert & oriented X3.  Speech normal, gait appropriate for age and unassisted Psych--  Cognition and judgment appear intact.  Cooperative with normal attention span and concentration.  Behavior appropriate. No anxious or depressed appearing.      Assessment & Plan:   Assessment> HTN Hyperlipidemia GERD Elevated TSH 2014 H/o gout  DJD- hip Chronic back pain + PPD 2000, s/p  antibiotics   PLAN DJD, Hip pain: was rx a hip replacement by ortho in few months. In the meantime needs help with pain. We'll continue with tramadol as prescribed by ortho , add Celebrex with close watch of his renal function. Prilosec for GI protection HTN: Seems well-controlled, check a BMP today. Elevated TFTs, question of goiter normal exam: Check TFTs and thyroid ultrasound. RTC 2 weeks to reassess pain and check a BMP

## 2015-11-05 NOTE — Patient Instructions (Signed)
Get your blood work before you leave   Take Celebrex 200 mg one tablet every day  Take omeprazole 20 mg one tablet before breakfast every day  Continue with tramadol as needed  Drink plenty of fluids  Next visit in 2 weeks, please make an appointment.

## 2015-11-06 DIAGNOSIS — Z09 Encounter for follow-up examination after completed treatment for conditions other than malignant neoplasm: Secondary | ICD-10-CM | POA: Insufficient documentation

## 2015-11-06 NOTE — Assessment & Plan Note (Signed)
DJD, Hip pain: was rx a hip replacement by ortho in few months. In the meantime needs help with pain. We'll continue with tramadol as prescribed by ortho , add Celebrex with close watch of his renal function. Prilosec for GI protection HTN: Seems well-controlled, check a BMP today. Elevated TFTs, question of goiter normal exam: Check TFTs and thyroid ultrasound. RTC 2 weeks to reassess pain and check a BMP

## 2015-11-10 ENCOUNTER — Ambulatory Visit (HOSPITAL_BASED_OUTPATIENT_CLINIC_OR_DEPARTMENT_OTHER): Admission: RE | Admit: 2015-11-10 | Payer: 59 | Source: Ambulatory Visit

## 2015-11-14 ENCOUNTER — Other Ambulatory Visit: Payer: Self-pay | Admitting: Internal Medicine

## 2015-11-19 ENCOUNTER — Encounter: Payer: Self-pay | Admitting: Internal Medicine

## 2015-11-19 ENCOUNTER — Ambulatory Visit (INDEPENDENT_AMBULATORY_CARE_PROVIDER_SITE_OTHER): Payer: 59 | Admitting: Internal Medicine

## 2015-11-19 VITALS — BP 138/86 | HR 56 | Temp 97.6°F | Ht 73.0 in | Wt 215.4 lb

## 2015-11-19 DIAGNOSIS — M1610 Unilateral primary osteoarthritis, unspecified hip: Secondary | ICD-10-CM | POA: Diagnosis not present

## 2015-11-19 DIAGNOSIS — I10 Essential (primary) hypertension: Secondary | ICD-10-CM | POA: Diagnosis not present

## 2015-11-19 DIAGNOSIS — R7303 Prediabetes: Secondary | ICD-10-CM | POA: Diagnosis not present

## 2015-11-19 DIAGNOSIS — Z114 Encounter for screening for human immunodeficiency virus [HIV]: Secondary | ICD-10-CM

## 2015-11-19 DIAGNOSIS — Z09 Encounter for follow-up examination after completed treatment for conditions other than malignant neoplasm: Secondary | ICD-10-CM

## 2015-11-19 LAB — BASIC METABOLIC PANEL
BUN: 14 mg/dL (ref 6–23)
CO2: 27 mEq/L (ref 19–32)
Calcium: 9.3 mg/dL (ref 8.4–10.5)
Chloride: 105 mEq/L (ref 96–112)
Creatinine, Ser: 0.97 mg/dL (ref 0.40–1.50)
GFR: 105.42 mL/min (ref >60.00)
Glucose, Bld: 94 mg/dL (ref 70–99)
Potassium: 4.1 mEq/L (ref 3.5–5.1)
Sodium: 140 mEq/L (ref 135–145)

## 2015-11-19 LAB — HEMOGLOBIN A1C: Hgb A1c MFr Bld: 5.9 % (ref 4.6–6.5)

## 2015-11-19 MED ORDER — AMLODIPINE BESYLATE 10 MG PO TABS: 10.0000 mg | ORAL_TABLET | Freq: Every day | ORAL | Status: DC

## 2015-11-19 MED ORDER — LOSARTAN POTASSIUM 100 MG PO TABS: 100.0000 mg | ORAL_TABLET | Freq: Every day | ORAL | Status: DC

## 2015-11-19 MED ORDER — HYDROCODONE-ACETAMINOPHEN 5-325 MG PO TABS: 1.0000 | ORAL_TABLET | Freq: Two times a day (BID) | ORAL | Status: DC | PRN

## 2015-11-19 MED ORDER — LANSOPRAZOLE 15 MG PO CPDR: 15.0000 mg | DELAYED_RELEASE_CAPSULE | Freq: Every day | ORAL | Status: DC

## 2015-11-19 MED ORDER — ATENOLOL 50 MG PO TABS: 50.0000 mg | ORAL_TABLET | Freq: Every day | ORAL | Status: DC

## 2015-11-19 NOTE — Progress Notes (Signed)
Pre visit review using our clinic review tool, if applicable. No additional management support is needed unless otherwise documented below in the visit note. 

## 2015-11-19 NOTE — Patient Instructions (Addendum)
Get your blood work before you leave   Continue with Celebrex  Stop ultram   IF THE PAIN IS NOT SEVERE TAKE TYLENOL IF THE PAIN IS MORE INTENSE, TAKE HYDROCODONE WHICH HAS TYLENOL ON IT ALREADY. DON'T TAKE BOTH.    Next visit  for a   Check up in 3-4 months    (15 minutes) Please schedule an appointment at the front desk

## 2015-11-19 NOTE — Progress Notes (Signed)
Subjective:    Patient ID: Mike Shepard, male    DOB: [birth year], 49 y.o.   MRN: [id number]  DOS:  11/19/2015 Type of visit - description : Follow-up Interval history: Since the last office visit he is taking Celebrex and feeling great when taking it in combination with Ultram. He did note some GI side effects including some nausea and vomited twice as well as heartburn. He stopped Ultram and all side effects went away despite keep taking Celebrex however the pain was not as well controlled. Patient  Had to cancel the thyroid ultrasound previously rx   Review of Systems Denies change in the color of the stools or abdominal pain.   Past Medical History  Diagnosis Date  . Hypertension   . GERD (gastroesophageal reflux disease)   . Hyperlipidemia   . Chronic back pain     see surgeries   . PPD positive     Positive PPD, remotely ~2000, s/p Rx    . H/O: gout     Past Surgical History  Procedure Laterality Date  . Back surgery      2007-2013 (low back)  . Neck surgery      2010    Social History   Social History  . Marital Status: Married    Spouse Name: N/A  . Number of Children: 3  . Years of Education: N/A   Occupational History  . city of [place] ([employer]    Social History Main Topics  . Smoking status: Former Smoker    Types: Cigarettes    Quit date: 01/27/2010  . Smokeless tobacco: Never Used     Comment: quit ~ 12-2014, vaping some   . Alcohol Use: 1.2 oz/week    1 Glasses of wine, 1 Cans of beer per week     Comment: socially   . Drug Use: No     Comment: denies   . Sexual Activity: Yes    Birth Control/ Protection: None   Other Topics Concern  . Not on file   Social History Narrative   Lives w/ wife, 3 children plus adopted 3 children        Medication List       This list is accurate as of: 11/19/15 11:59 PM.  Always use your most recent med list.               amLODipine 10 MG tablet  Commonly known as:  NORVASC  Take 1 tablet  (10 mg total) by mouth daily.     atenolol 50 MG tablet  Commonly known as:  TENORMIN  Take 1 tablet (50 mg total) by mouth daily.     celecoxib 200 MG capsule  Commonly known as:  CELEBREX  Take 1 capsule (200 mg total) by mouth daily.     fluticasone 50 MCG/ACT nasal spray  Commonly known as:  FLONASE  Place 2 sprays into the nose daily.     HYDROcodone-acetaminophen 5-325 MG tablet  Commonly known as:  NORCO/VICODIN  Take 1-2 tablets by mouth 2 (two) times daily as needed.     lansoprazole 15 MG capsule  Commonly known as:  PREVACID  Take 1 capsule (15 mg total) by mouth daily.     losartan 100 MG tablet  Commonly known as:  COZAAR  Take 1 tablet (100 mg total) by mouth daily.     multivitamin with minerals Tabs tablet  Take 1 tablet by mouth daily.  Objective:   Physical Exam BP 138/86 mmHg  Pulse 56  Temp(Src) 97.6 F (36.4 C) (Oral)  Ht 6\' 1"  (1.854 m)  Wt 215 lb 6 oz (97.693 kg)  BMI 28.42 kg/m2  SpO2 98% General:   Well developed, well nourished . NAD.  HEENT:  Normocephalic . Face symmetric, atraumatic  Abdomen:  Not distended, soft, non-tender. No rebound or rigidity. No mass,organomegaly Skin: Not pale. Not jaundice Neurologic:  alert & oriented X3.  Speech normal, gait appropriate for age and unassisted Psych--  Cognition and judgment appear intact.  Cooperative with normal attention span and concentration.  Behavior appropriate. No anxious or depressed appearing.     Assessment & Plan:   Assessment> HTN Hyperlipidemia GERD Elevated TSH 2014 H/o gout  DJD- hip Chronic back pain + PPD 2000, s/p  antibiotics   PLAN DJD, hip pain: Doing great with Celebrex and Ultram as far as pain control but apparently having GI side effects from Ultram. Will switch to Tylenol and hydrocodone as needed, see instructions. HTN: Well-controlled, taking Celebrex, check a BMP Elevated TFTs: labs were ok , patient cancel the ultrasound [place] but  plans to reschedule Hyperglycemia: Check A1c RTC 4 months

## 2015-11-21 NOTE — Assessment & Plan Note (Signed)
DJD, hip pain: Doing great with Celebrex and Ultram as far as pain control but apparently having GI side effects from Ultram. Will switch to Tylenol and hydrocodone as needed, see instructions. HTN: Well-controlled, taking Celebrex, check a BMP Elevated TFTs: labs were ok , patient cancel the ultrasound [place] but plans to reschedule Hyperglycemia: Check A1c RTC 4 months

## 2015-11-22 LAB — HIV ANTIBODY (ROUTINE TESTING W REFLEX): HIV 1&2 Ab, 4th Generation: NONREACTIVE

## 2016-01-07 ENCOUNTER — Other Ambulatory Visit: Payer: Self-pay | Admitting: Internal Medicine

## 2016-01-07 NOTE — Telephone Encounter (Signed)
Rx sent 

## 2016-01-07 NOTE — Telephone Encounter (Signed)
Pt is requesting refill on Celebrex.   Last OV: 11/19/2015 Last Fill: 11/08/2015 #21 and 0RF  Okay to refill?

## 2016-01-07 NOTE — Telephone Encounter (Signed)
Okay #30 and one refill 

## 2016-02-24 ENCOUNTER — Other Ambulatory Visit: Payer: Self-pay

## 2016-02-25 ENCOUNTER — Ambulatory Visit: Payer: 59 | Admitting: Internal Medicine

## 2016-02-25 ENCOUNTER — Telehealth: Payer: Self-pay | Admitting: Internal Medicine

## 2016-02-28 ENCOUNTER — Encounter: Payer: Self-pay | Admitting: Internal Medicine

## 2016-02-28 NOTE — Telephone Encounter (Signed)
Charge. 

## 2016-02-28 NOTE — Telephone Encounter (Signed)
Pt was no show 02/25/16 11:15am for follow up appt, pt has not rescheduled, charge or no charge?

## 2016-02-28 NOTE — Telephone Encounter (Signed)
Marked to charge and mailing no show letter °

## 2016-04-14 ENCOUNTER — Ambulatory Visit: Payer: 59 | Admitting: Internal Medicine

## 2016-04-17 ENCOUNTER — Telehealth: Payer: Self-pay | Admitting: Internal Medicine

## 2016-04-17 NOTE — Telephone Encounter (Signed)
Can be reached: [phone]  Reason for call: Pt asking for refill on hydrocodone. He is out and has been out. Was supposed to come last week but provider rescheduled.

## 2016-04-17 NOTE — Telephone Encounter (Signed)
Last OV: 11/19/15 Last filled: 11/19/15, #60, 0 RF  Sig: Take 1-2 tablets by mouth 2 (two) times daily as needed. UDS: Not on file  Pt rescheduled for 04/21/16.

## 2016-04-18 MED ORDER — HYDROCODONE-ACETAMINOPHEN 5-325 MG PO TABS: 1.0000 | ORAL_TABLET | Freq: Two times a day (BID) | ORAL | Status: DC | PRN

## 2016-04-18 NOTE — Telephone Encounter (Signed)
Rx printed and signed.  Called and spoke with the pt and informed him that the prescription has been approved and I will place it up front for him to pick up.  Pt verbalized understanding and agreed.//AB/CMA

## 2016-04-18 NOTE — Telephone Encounter (Signed)
Okay #60, no refill. Please print a prescription

## 2016-04-21 ENCOUNTER — Encounter: Payer: Self-pay | Admitting: Internal Medicine

## 2016-04-21 ENCOUNTER — Ambulatory Visit (INDEPENDENT_AMBULATORY_CARE_PROVIDER_SITE_OTHER): Payer: 59 | Admitting: Internal Medicine

## 2016-04-21 VITALS — BP 126/62 | HR 60 | Temp 98.2°F | Ht 73.0 in | Wt 214.4 lb

## 2016-04-21 DIAGNOSIS — M150 Primary generalized (osteo)arthritis: Secondary | ICD-10-CM | POA: Diagnosis not present

## 2016-04-21 DIAGNOSIS — I10 Essential (primary) hypertension: Secondary | ICD-10-CM | POA: Diagnosis not present

## 2016-04-21 DIAGNOSIS — M8949 Other hypertrophic osteoarthropathy, multiple sites: Secondary | ICD-10-CM

## 2016-04-21 DIAGNOSIS — M1990 Unspecified osteoarthritis, unspecified site: Secondary | ICD-10-CM | POA: Insufficient documentation

## 2016-04-21 DIAGNOSIS — M159 Polyosteoarthritis, unspecified: Secondary | ICD-10-CM

## 2016-04-21 DIAGNOSIS — Z09 Encounter for follow-up examination after completed treatment for conditions other than malignant neoplasm: Secondary | ICD-10-CM

## 2016-04-21 NOTE — Progress Notes (Signed)
Pre visit review using our clinic review tool, if applicable. No additional management support is needed unless otherwise documented below in the visit note. 

## 2016-04-21 NOTE — Progress Notes (Signed)
Subjective:    Patient ID: Mike Shepard, male    DOB: 06-20-1966, 49 y.o.   MRN: [id number]  DOS:  04/21/2016 Type of visit - description : Routine checkup interval history:  DJD: Current medicine is working well for him. HTN: Ambulatory BPs always less than 140/85, mostly in the 130s. Good medication compliance.  Hoarseness all through  the winter, for the las week is eventually getting better.    Review of Systems  Denies chest pain or difficulty breathing No cough + Postnasal dripping, on Flonase, getting slightly better lately.  Past Medical History  Diagnosis Date  . Hypertension   . GERD (gastroesophageal reflux disease)   . Hyperlipidemia   . Chronic back pain     see surgeries   . PPD positive     Positive PPD, remotely ~2000, s/p Rx    . H/O: gout     Past Surgical History  Procedure Laterality Date  . Back surgery      2007-2013 (low back)  . Neck surgery      2010    Social History   Social History  . Marital Status: Married    Spouse Name: N/A  . Number of Children: 3  . Years of Education: N/A   Occupational History  . city of [place] ([place]    Social History Main Topics  . Smoking status: Former Smoker    Types: Cigarettes    Quit date: 01/27/2010  . Smokeless tobacco: Never Used     Comment: quit ~ 12-2014, quit vaping  e   . Alcohol Use: 1.2 oz/week    1 Glasses of wine, 1 Cans of beer per week     Comment: socially   . Drug Use: No     Comment: denies   . Sexual Activity: Yes    Birth Control/ Protection: None   Other Topics Concern  . Not on file   Social History Narrative   Lives w/ wife, 3 children plus adopted 3 children        Medication List       This list is accurate as of: 04/21/16  2:12 PM.  Always use your most recent med list.               amLODipine 10 MG tablet  Commonly known as:  NORVASC  Take 1 tablet (10 mg total) by mouth daily.     atenolol 50 MG tablet  Commonly known as:  TENORMIN  Take  1 tablet (50 mg total) by mouth daily.     celecoxib 200 MG capsule  Commonly known as:  CELEBREX  Take 1 capsule (200 mg total) by mouth daily.     fluticasone 50 MCG/ACT nasal spray  Commonly known as:  FLONASE  Place 2 sprays into the nose daily.     HYDROcodone-acetaminophen 5-325 MG tablet  Commonly known as:  NORCO/VICODIN  Take 1-2 tablets by mouth 2 (two) times daily as needed.     lansoprazole 15 MG capsule  Commonly known as:  PREVACID  Take 1 capsule (15 mg total) by mouth daily.     losartan 100 MG tablet  Commonly known as:  COZAAR  Take 1 tablet (100 mg total) by mouth daily.     multivitamin with minerals Tabs tablet  Take 1 tablet by mouth daily.           Objective:   Physical Exam BP 126/62 mmHg  Pulse 60  Temp(Src) 98.2 F (  36.8 C) (Oral)  Ht 6\' 1"  (1.854 m)  Wt 214 lb 6.4 oz (97.251 kg)  BMI 28.29 kg/m2  SpO2 99% General:   Well developed, well nourished . NAD.  HEENT:  Normocephalic . Face symmetric, atraumatic Nose is slightly congested, throat symmetric without redness.  Neck: No LADs Lungs:  CTA B Normal respiratory effort, no intercostal retractions, no accessory muscle use. Heart: RRR,  no murmur.  No pretibial edema bilaterally  Skin: Not pale. Not jaundice Neurologic:  alert & oriented X3.  Speech normal, gait appropriate for age and unassisted Psych--  Cognition and judgment appear intact.  Cooperative with normal attention span and concentration.  Behavior appropriate. No anxious or depressed appearing.      Assessment & Plan:   Assessment> Prediabetes A1c 6.1 2015 HTN Hyperlipidemia GERD Elevated TSH 2014 H/o gout  DJD- hip Chronic back pain + PPD 2000, s/p  antibiotics   PLAN DJD: Well controlled with daily Celebrex, taking hydrocodone sporadically. HTN: Last BMP satisfactory, continue with amlodipine, losartan, atenolol. Hoarseness: Former smoker, hoarseness for a few months, getting better, related to PND?Marland Kitchen  If not back to normal when he comes back will need further eval Elevated TFTs: TSH finally normalize, asked patient to call when he is ready for a ultrasound. RTC 3 months, CPX

## 2016-04-21 NOTE — Patient Instructions (Signed)
GO TO THE FRONT DESK  Schedule your next appointment for a  complete physical exam in 3 months. Fasting.     Check the  blood pressure   weekly   Be sure your blood pressure is between 110/65 and  145/85. If it is consistently higher or lower, let me know

## 2016-04-21 NOTE — Assessment & Plan Note (Signed)
DJD: Well controlled with daily Celebrex, taking hydrocodone sporadically. HTN: Last BMP satisfactory, continue with amlodipine, losartan, atenolol. Hoarseness: Former smoker, hoarseness for a few months, getting better, related to PND?Marland Kitchen If not back to normal when he comes back will need further eval Elevated TFTs: TSH finally normalize, asked patient to call when he is ready for a ultrasound. RTC 3 months, CPX

## 2016-05-19 ENCOUNTER — Other Ambulatory Visit: Payer: Self-pay | Admitting: Internal Medicine

## 2016-05-19 NOTE — Telephone Encounter (Signed)
Pt is requesting refill on Celebrex.   Last OV: 04/21/2016  Last Fill: 01/07/2016 #30 and 1RF   Okay to refill?

## 2016-05-19 NOTE — Telephone Encounter (Signed)
Rx sent 

## 2016-05-19 NOTE — Telephone Encounter (Signed)
Okay 30 and 2 refills 

## 2016-07-28 ENCOUNTER — Ambulatory Visit (INDEPENDENT_AMBULATORY_CARE_PROVIDER_SITE_OTHER): Payer: 59 | Admitting: Internal Medicine

## 2016-07-28 ENCOUNTER — Encounter: Payer: Self-pay | Admitting: Internal Medicine

## 2016-07-28 VITALS — BP 142/80 | HR 80 | Temp 98.1°F | Resp 12 | Ht 73.0 in | Wt 223.1 lb

## 2016-07-28 DIAGNOSIS — Z1211 Encounter for screening for malignant neoplasm of colon: Secondary | ICD-10-CM

## 2016-07-28 DIAGNOSIS — E010 Iodine-deficiency related diffuse (endemic) goiter: Secondary | ICD-10-CM

## 2016-07-28 DIAGNOSIS — Z0000 Encounter for general adult medical examination without abnormal findings: Secondary | ICD-10-CM

## 2016-07-28 NOTE — Progress Notes (Signed)
Subjective:    Patient ID: Mike Shepard, male    DOB: July 24, 1966, 50 y.o.   MRN: [id number]  DOS:  07/28/2016 Type of visit - description : CPX Interval history: No major concerns, good compliance with medications  BP Readings from Last 3 Encounters:  07/28/16 (!) 142/80  04/21/16 126/62  11/19/15 138/86     Review of Systems Constitutional: No fever. No chills. No unexplained wt changes. No unusual sweats  HEENT: No dental problems, no ear discharge, no facial swelling, no voice changes. No eye discharge, no eye  redness , no  intolerance to light   Respiratory: No wheezing , no  difficulty breathing. No cough , no mucus production  Cardiovascular: No CP, no leg swelling , no  Palpitations  GI: no nausea, no vomiting, no diarrhea , no  abdominal pain.  No blood in the stools. No dysphagia, no odynophagia    Endocrine: No polyphagia, no polyuria , no polydipsia  GU: No dysuria, gross hematuria, difficulty urinating. No urinary urgency, no frequency.  Musculoskeletal: pain well controlled with current medications  Skin: No change in the color of the skin, palor , no  Rash  Allergic, immunologic: No environmental allergies , no  food allergies  Neurological: No dizziness no  syncope. No headaches. No diplopia, no slurred, no slurred speech, no motor deficits, no facial  Numbness  Hematological: No enlarged lymph nodes, no easy bruising , no unusual bleedings  Psychiatry: No suicidal ideas, no hallucinations, no beavior problems, no confusion.  No unusual/severe anxiety, no depression   Past Medical History:  Diagnosis Date  . Chronic back pain    see surgeries   . GERD (gastroesophageal reflux disease)   . H/O: gout   . Hyperlipidemia   . Hypertension   . PPD positive    Positive PPD, remotely ~2000, s/p Rx      Past Surgical History:  Procedure Laterality Date  . BACK SURGERY     2007-2013 (low back)  . NECK SURGERY     2010    Social History    Social History  . Marital status: Married    Spouse name: N/A  . Number of children: 3  . Years of education: N/A   Occupational History  . city of [place] ([employer]    Social History Main Topics  . Smoking status: Former Smoker    Types: Cigarettes    Quit date: 01/27/2010  . Smokeless tobacco: Never Used     Comment: quit ~ 12-2014, quit vaping  e   . Alcohol use 1.2 oz/week    1 Glasses of wine, 1 Cans of beer per week     Comment: socially   . Drug use: No     Comment: denies   . Sexual activity: Yes    Birth control/ protection: None   Other Topics Concern  . Not on file   Social History Narrative   Lives w/ wife, 3 children plus adopted 3 children     Family History  Problem Relation Age of Onset  . Hypertension Mother     M,F,Sis, bro  . Diabetes Father     F, M. B  . Diabetes Sister   . Hypertension Sister   . Diabetes Brother   . Hypertension Brother   . Prostate cancer Paternal Grandfather     dx ~ 27 y/o  . Anesthesia problems Neg Hx   . Colon cancer Neg Hx  Medication List       Accurate as of 07/28/16 11:59 PM. Always use your most recent med list.          amLODipine 10 MG tablet Commonly known as:  NORVASC Take 1 tablet (10 mg total) by mouth daily.   atenolol 50 MG tablet Commonly known as:  TENORMIN Take 1 tablet (50 mg total) by mouth daily.   celecoxib 200 MG capsule Commonly known as:  CELEBREX Take 1 capsule (200 mg total) by mouth daily.   HYDROcodone-acetaminophen 5-325 MG tablet Commonly known as:  NORCO/VICODIN Take 1-2 tablets by mouth 2 (two) times daily as needed.   lansoprazole 15 MG capsule Commonly known as:  PREVACID Take 1 capsule (15 mg total) by mouth daily.   losartan 100 MG tablet Commonly known as:  COZAAR Take 1 tablet (100 mg total) by mouth daily.   multivitamin with minerals Tabs tablet Take 1 tablet by mouth daily.          Objective:   Physical Exam BP (!) 142/80 (BP Location: Left  Arm, Patient Position: Sitting, Cuff Size: Normal)   Pulse 80   Temp 98.1 F (36.7 C) (Oral)   Resp 12   Ht 6\' 1"  (1.854 m)   Wt 223 lb 2 oz (101.2 kg)   SpO2 99%   BMI 29.44 kg/m   General:   Well developed, well nourished . NAD.  Neck: No  thyromegaly  HEENT:  Normocephalic . Face symmetric, atraumatic Neck: Question thyromegaly right Shepard, no TTP, not nodular. Lungs:  CTA B Normal respiratory effort, no intercostal retractions, no accessory muscle use. Heart: RRR,  no murmur.  No pretibial edema bilaterally  Abdomen:  Not distended, soft, non-tender. No rebound or rigidity.   Skin: Exposed areas without rash. Not pale. Not jaundice Neurologic:  alert & oriented X3.  Speech normal, gait appropriate for age and unassisted Strength symmetric and appropriate for age.  Psych: Cognition and judgment appear intact.  Cooperative with normal attention span and concentration.  Behavior appropriate. No anxious or depressed appearing.    Assessment & Plan:   Assessment> Prediabetes A1c 6.1 2015 HTN Hyperlipidemia GERD Elevated TSH 2014 H/o gout  DJD- hip Chronic back pain + PPD 2000, s/p  antibiotics   PLAN Prediabetes: Last A1c is stable. HTN: BP today slightly elevated, ambulatory BPs 120, 130. No change for now. Hyperlipidemia: Checking labs today Question thyromegaly: Arrange a ultrasound Hoarseness: Resolved after he is Flonase. DJD, chronic back pain: Well-controlled with Celebrex and sporadic use of hydrocodone RTC 6 months

## 2016-07-28 NOTE — Progress Notes (Signed)
Pre visit review using our clinic review tool, if applicable. No additional management support is needed unless otherwise documented below in the visit note. 

## 2016-07-28 NOTE — Assessment & Plan Note (Addendum)
Td 2016 Never had a cscope-options discussed, elected colonoscopy. + FH prostate cancer, GF age 50, DRE (-) 2016, PSA wnl, last 2016. Reassess  Screening next year Labs : CMP, FLP, CBC. EKG today sinus bradycardia, asx Diet-exercise discussed

## 2016-07-28 NOTE — Patient Instructions (Signed)
GO TO THE FRONT DESK Schedule your next appointment for a  routine checkup in 6 months  Schedule labs to be done next week, fasting  -- Check the  blood pressure 2 or 3 times a  Week  Be sure your blood pressure is between 110/65 and  140/85. If it is consistently higher or lower, let me know

## 2016-07-29 ENCOUNTER — Other Ambulatory Visit: Payer: Self-pay | Admitting: Internal Medicine

## 2016-07-30 NOTE — Assessment & Plan Note (Signed)
Prediabetes: Last A1c is stable. HTN: BP today slightly elevated, ambulatory BPs 120, 130. No change for now. Hyperlipidemia: Checking labs today Question thyromegaly: Arrange a ultrasound Hoarseness: Resolved after he is Flonase. DJD, chronic back pain: Well-controlled with Celebrex and sporadic use of hydrocodone RTC 6 months

## 2016-07-31 ENCOUNTER — Encounter: Payer: Self-pay | Admitting: Gastroenterology

## 2016-08-04 ENCOUNTER — Ambulatory Visit (HOSPITAL_BASED_OUTPATIENT_CLINIC_OR_DEPARTMENT_OTHER): Payer: 59

## 2016-08-04 ENCOUNTER — Other Ambulatory Visit (INDEPENDENT_AMBULATORY_CARE_PROVIDER_SITE_OTHER): Payer: 59

## 2016-08-04 DIAGNOSIS — Z0000 Encounter for general adult medical examination without abnormal findings: Secondary | ICD-10-CM | POA: Diagnosis not present

## 2016-08-04 LAB — CBC WITH DIFFERENTIAL/PLATELET
Basophils Absolute: 0 10*3/uL (ref 0.0–0.1)
Basophils Relative: 0.6 % (ref 0.0–3.0)
Eosinophils Absolute: 0.2 10*3/uL (ref 0.0–0.7)
Eosinophils Relative: 3.9 % (ref 0.0–5.0)
HCT: 39.4 % (ref 39.0–52.0)
Hemoglobin: 13.5 g/dL (ref 13.0–17.0)
Lymphocytes Relative: 52.5 % — ABNORMAL HIGH (ref 12.0–46.0)
Lymphs Abs: 3.2 10*3/uL (ref 0.7–4.0)
MCHC: 34.3 g/dL (ref 30.0–36.0)
MCV: 86.3 fl (ref 78.0–100.0)
Monocytes Absolute: 0.6 10*3/uL (ref 0.1–1.0)
Monocytes Relative: 9 % (ref 3.0–12.0)
Neutro Abs: 2.1 10*3/uL (ref 1.4–7.7)
Neutrophils Relative %: 34 % — ABNORMAL LOW (ref 43.0–77.0)
Platelets: 212 10*3/uL (ref 150.0–400.0)
RBC: 4.57 Mil/uL (ref 4.22–5.81)
RDW: 13.2 % (ref 11.5–15.5)
WBC: 6.1 10*3/uL (ref 4.0–10.5)

## 2016-08-04 LAB — COMPREHENSIVE METABOLIC PANEL
ALT: 28 U/L (ref 0–53)
AST: 19 U/L (ref 0–37)
Albumin: 3.9 g/dL (ref 3.5–5.2)
Alkaline Phosphatase: 95 U/L (ref 39–117)
BUN: 16 mg/dL (ref 6–23)
CO2: 28 mEq/L (ref 19–32)
Calcium: 9.3 mg/dL (ref 8.4–10.5)
Chloride: 105 mEq/L (ref 96–112)
Creatinine, Ser: 0.99 mg/dL (ref 0.40–1.50)
GFR: 102.67 mL/min (ref >60.00)
Glucose, Bld: 104 mg/dL — ABNORMAL HIGH (ref 70–99)
Potassium: 4.4 mEq/L (ref 3.5–5.1)
Sodium: 139 mEq/L (ref 135–145)
Total Bilirubin: 0.5 mg/dL (ref 0.2–1.2)
Total Protein: 6.9 g/dL (ref 6.0–8.3)

## 2016-08-04 LAB — LIPID PANEL
Cholesterol: 171 mg/dL (ref 0–200)
HDL: 33.5 mg/dL — ABNORMAL LOW (ref >39.00)
LDL Cholesterol: 105 mg/dL — ABNORMAL HIGH (ref 0–99)
NonHDL: 137.94
Total CHOL/HDL Ratio: 5
Triglycerides: 165 mg/dL — ABNORMAL HIGH (ref 0.0–149.0)
VLDL: 33 mg/dL (ref 0.0–40.0)

## 2016-08-12 ENCOUNTER — Ambulatory Visit (HOSPITAL_BASED_OUTPATIENT_CLINIC_OR_DEPARTMENT_OTHER): Admission: RE | Admit: 2016-08-12 | Payer: 59 | Source: Ambulatory Visit

## 2016-08-16 ENCOUNTER — Encounter: Payer: 59 | Admitting: Gastroenterology

## 2016-10-04 ENCOUNTER — Telehealth: Payer: Self-pay | Admitting: *Deleted

## 2016-10-04 NOTE — Telephone Encounter (Signed)
Attempted multiple other times to reach this patient today and phone goes directly to voicemail as if it is turned off. Cancelled colonoscopy and sent letter regarding missed previsit appointment. Will need to reschedule both previsit and colonoscopy appointments.

## 2016-10-04 NOTE — Telephone Encounter (Signed)
Pt no show for PV 10/04/16. Phone call to pt, no answer, number identifier, message left that PV needs to be rescheduled before 5 pm today in order to keep colonoscopy appointment 10/18/16.

## 2016-10-18 ENCOUNTER — Encounter: Payer: 59 | Admitting: Gastroenterology

## 2016-11-03 ENCOUNTER — Other Ambulatory Visit: Payer: Self-pay | Admitting: Internal Medicine

## 2016-12-07 ENCOUNTER — Other Ambulatory Visit: Payer: Self-pay | Admitting: Internal Medicine

## 2017-01-17 DIAGNOSIS — I10 Essential (primary) hypertension: Secondary | ICD-10-CM | POA: Diagnosis not present

## 2017-02-02 ENCOUNTER — Ambulatory Visit: Payer: 59 | Admitting: Internal Medicine

## 2017-02-12 DIAGNOSIS — I10 Essential (primary) hypertension: Secondary | ICD-10-CM | POA: Diagnosis not present

## 2017-03-11 ENCOUNTER — Other Ambulatory Visit: Payer: Self-pay | Admitting: Internal Medicine

## 2017-04-09 DIAGNOSIS — I10 Essential (primary) hypertension: Secondary | ICD-10-CM | POA: Diagnosis not present

## 2017-04-13 ENCOUNTER — Other Ambulatory Visit: Payer: Self-pay | Admitting: Internal Medicine

## 2017-05-01 ENCOUNTER — Ambulatory Visit (INDEPENDENT_AMBULATORY_CARE_PROVIDER_SITE_OTHER): Payer: 59 | Admitting: Internal Medicine

## 2017-05-01 ENCOUNTER — Encounter: Payer: Self-pay | Admitting: Internal Medicine

## 2017-05-01 VITALS — BP 134/78 | HR 58 | Temp 97.8°F | Resp 14 | Ht 73.0 in | Wt 236.1 lb

## 2017-05-01 DIAGNOSIS — M159 Polyosteoarthritis, unspecified: Secondary | ICD-10-CM

## 2017-05-01 DIAGNOSIS — M150 Primary generalized (osteo)arthritis: Secondary | ICD-10-CM

## 2017-05-01 DIAGNOSIS — R739 Hyperglycemia, unspecified: Secondary | ICD-10-CM | POA: Diagnosis not present

## 2017-05-01 DIAGNOSIS — I10 Essential (primary) hypertension: Secondary | ICD-10-CM | POA: Diagnosis not present

## 2017-05-01 DIAGNOSIS — M8949 Other hypertrophic osteoarthropathy, multiple sites: Secondary | ICD-10-CM

## 2017-05-01 MED ORDER — ATENOLOL 50 MG PO TABS: 50.0000 mg | ORAL_TABLET | Freq: Every day | ORAL | 6 refills | Status: DC

## 2017-05-01 MED ORDER — AMLODIPINE BESYLATE 10 MG PO TABS: 10.0000 mg | ORAL_TABLET | Freq: Every day | ORAL | 6 refills | Status: DC

## 2017-05-01 MED ORDER — CELECOXIB 200 MG PO CAPS: 200.0000 mg | ORAL_CAPSULE | Freq: Every day | ORAL | 6 refills | Status: DC

## 2017-05-01 MED ORDER — LOSARTAN POTASSIUM 100 MG PO TABS: 100.0000 mg | ORAL_TABLET | Freq: Every day | ORAL | 6 refills | Status: DC

## 2017-05-01 NOTE — Progress Notes (Signed)
Pre visit review using our clinic review tool, if applicable. No additional management support is needed unless otherwise documented below in the visit note. 

## 2017-05-01 NOTE — Patient Instructions (Signed)
  GO TO THE FRONT DESK Schedule labs to be done next week.  Schedule your next appointment for a  physical exam in 3-4 months

## 2017-05-01 NOTE — Progress Notes (Signed)
Subjective:    Patient ID: Mike Shepard, male    DOB: Sep 01, 1966, 51 y.o.   MRN: [id number]  DOS:  05/01/2017 Type of visit - description : Routine checkup Interval history: HTN: Good compliance of medication except for the last 3-4 days he ran out of losartan. Ambulatory BPs normal DJD: Celebrex help however hip pain persists to some extent.    Review of Systems Denies nausea, vomiting, diarrhea. No chest pain or difficulty breathing  Past Medical History:  Diagnosis Date  . Chronic back pain    see surgeries   . GERD (gastroesophageal reflux disease)   . H/O: gout   . Hyperlipidemia   . Hypertension   . PPD positive    Positive PPD, remotely ~2000, s/p Rx      Past Surgical History:  Procedure Laterality Date  . BACK SURGERY     2007-2013 (low back)  . NECK SURGERY     2010    Social History   Social History  . Marital status: Married    Spouse name: N/A  . Number of children: 3  . Years of education: N/A   Occupational History  . city [place] ([place]    Social History Main Topics  . Smoking status: Former Smoker    Types: Cigarettes    Quit date: 01/27/2010  . Smokeless tobacco: Never Used     Comment: quit ~ 12-2014, quit vaping  e   . Alcohol use 1.2 oz/week    1 Glasses of wine, 1 Cans of beer per week     Comment: socially   . Drug use: No     Comment: denies   . Sexual activity: Yes    Birth control/ protection: None   Other Topics Concern  . Not on file   Social History Narrative   Lives w/ wife, 3 children plus adopted 3 children      Allergies as of 05/01/2017   No Known Allergies     Medication List       Accurate as of 05/01/17  5:50 PM. Always use your most recent med list.          amLODipine 10 MG tablet Commonly known as:  NORVASC Take 1 tablet (10 mg total) by mouth daily.   atenolol 50 MG tablet Commonly known as:  TENORMIN Take 1 tablet (50 mg total) by mouth daily.   celecoxib 200 MG capsule Commonly  known as:  CELEBREX Take 1 capsule (200 mg total) by mouth daily.   HYDROcodone-acetaminophen 5-325 MG tablet Commonly known as:  NORCO/VICODIN Take 1-2 tablets by mouth 2 (two) times daily as needed.   lansoprazole 15 MG capsule Commonly known as:  PREVACID Take 1 capsule (15 mg total) by mouth daily.   losartan 100 MG tablet Commonly known as:  COZAAR Take 1 tablet (100 mg total) by mouth daily.   multivitamin with minerals Tabs tablet Take 1 tablet by mouth daily.          Objective:   Physical Exam BP 134/78 (BP Location: Left Arm, Patient Position: Sitting, Cuff Size: Normal)   Pulse (!) 58   Temp 97.8 F (36.6 C) (Oral)   Resp 14   Ht 6\' 1"  (1.854 m)   Wt 236 lb 2 oz (107.1 kg)   SpO2 98%   BMI 31.15 kg/m  General:   Well developed, well nourished . NAD.  HEENT:  Normocephalic . Face symmetric, atraumatic Lungs:  CTA B Normal respiratory  effort, no intercostal retractions, no accessory muscle use. Heart: RRR,  no murmur.  No pretibial edema bilaterally  Skin: Not pale. Not jaundice Neurologic:  alert & oriented X3.  Speech normal, gait appropriate for age and unassisted Psych--  Cognition and judgment appear intact.  Cooperative with normal attention span and concentration.  Behavior appropriate. No anxious or depressed appearing.      Assessment & Plan:   Assessment Prediabetes A1c 6.1 2015 HTN Hyperlipidemia GERD Elevated TSH 2014 H/o gout  DJD- hip Chronic back pain + PPD 2000, s/p  antibiotics   PLAN Prediabetes: Check A1c HTN: BP well-controlled, ran out of losartan ~3-4 days ago, we are refilling it. Continue also atenolol and amlodipine, BMP next week. DJD: On daily Celebrex, takes hydrocodone 2 or 3 times a month. Checking kidney function. Pt thinking about contact ortho due to persistent pain despite Celebrex (which is actually helping). Question thyromegaly: see previous OV, [place] not done, will reassess at the next visit RTC 3-4  months

## 2017-05-01 NOTE — Assessment & Plan Note (Signed)
Prediabetes: Check A1c HTN: BP well-controlled, ran out of losartan ~3-4 days ago, we are refilling it. Continue also atenolol and amlodipine, BMP next week. DJD: On daily Celebrex, takes hydrocodone 2 or 3 times a month. Checking kidney function. Pt thinking about contact ortho due to persistent pain despite Celebrex (which is actually helping). Question thyromegaly: see previous OV, [place] not done, will reassess at the next visit RTC 3-4 months

## 2017-05-11 ENCOUNTER — Other Ambulatory Visit (INDEPENDENT_AMBULATORY_CARE_PROVIDER_SITE_OTHER): Payer: 59

## 2017-05-11 DIAGNOSIS — I10 Essential (primary) hypertension: Secondary | ICD-10-CM | POA: Diagnosis not present

## 2017-05-11 DIAGNOSIS — R739 Hyperglycemia, unspecified: Secondary | ICD-10-CM

## 2017-05-11 LAB — BASIC METABOLIC PANEL
BUN: 17 mg/dL (ref 6–23)
CO2: 29 mEq/L (ref 19–32)
Calcium: 9.2 mg/dL (ref 8.4–10.5)
Chloride: 103 mEq/L (ref 96–112)
Creatinine, Ser: 0.92 mg/dL (ref 0.40–1.50)
GFR: 111.4 mL/min (ref >60.00)
Glucose, Bld: 100 mg/dL — ABNORMAL HIGH (ref 70–99)
Potassium: 4.1 mEq/L (ref 3.5–5.1)
Sodium: 138 mEq/L (ref 135–145)

## 2017-05-11 LAB — HEMOGLOBIN A1C: Hgb A1c MFr Bld: 6 % (ref 4.6–6.5)

## 2017-05-21 DIAGNOSIS — M1611 Unilateral primary osteoarthritis, right hip: Secondary | ICD-10-CM | POA: Diagnosis not present

## 2017-07-20 ENCOUNTER — Encounter: Payer: 59 | Admitting: Internal Medicine

## 2017-08-08 ENCOUNTER — Ambulatory Visit (INDEPENDENT_AMBULATORY_CARE_PROVIDER_SITE_OTHER): Payer: 59 | Admitting: Internal Medicine

## 2017-08-08 ENCOUNTER — Encounter: Payer: Self-pay | Admitting: Internal Medicine

## 2017-08-08 VITALS — BP 128/78 | HR 58 | Temp 98.0°F | Resp 14 | Ht 73.0 in | Wt 229.2 lb

## 2017-08-08 DIAGNOSIS — Z0000 Encounter for general adult medical examination without abnormal findings: Secondary | ICD-10-CM | POA: Diagnosis not present

## 2017-08-08 NOTE — Progress Notes (Signed)
Subjective:    Patient ID: Mike Shepard, male    DOB: 05/28/1966, 51 y.o.   MRN: [id number]  DOS:  08/08/2017 Type of visit - description : CPX Interval history:  Here for a physical, also needs surgical clearance. Needs a  a hip replacement   Review of Systems In general feels well other than the hip pain. Chronic back pain is not a major issue at this time. He remains active at work and at home,  the only limiting factor is hip pain. Denies chest pain, palpitations, DOE. No orthopnea. No lower extremity edema  Other than above, a 14 point review of systems is negative    \ Past Medical History:  Diagnosis Date  . Chronic back pain    see surgeries   . GERD (gastroesophageal reflux disease)   . H/O: gout   . Hyperlipidemia   . Hypertension   . PPD positive    Positive PPD, remotely ~2000, s/p Rx    . Primary osteoarthritis of right hip    end stage    Past Surgical History:  Procedure Laterality Date  . BACK SURGERY     2007-2013 (low back)  . NECK SURGERY     2010    Social History   Social History  . Marital status: Married    Spouse name: N/A  . Number of children: 3  . Years of education: N/A   Occupational History  . city of [place] ([place]    Social History Main Topics  . Smoking status: Former Smoker    Types: Cigarettes    Quit date: 01/27/2010  . Smokeless tobacco: Current User    Types: Chew     Comment: chews sometimes; quit smoking~ 12-2014, quit vaping     . Alcohol use 1.2 oz/week    1 Glasses of wine, 1 Cans of beer per week     Comment: socially   . Drug use: No     Comment: denies   . Sexual activity: Yes    Birth control/ protection: None   Other Topics Concern  . Not on file   Social History Narrative   Lives w/ wife, 3 children plus adopted 3 children     Family History  Problem Relation Age of Onset  . Hypertension Mother        M,F,Sis, bro  . Diabetes Father        F, M. B  . Diabetes Sister   . Hypertension  Sister   . Diabetes Brother   . Hypertension Brother   . Prostate cancer Paternal Grandfather        dx ~ 91 y/o  . Anesthesia problems Neg Hx   . Colon cancer Neg Hx   . CAD Neg Hx   . Stroke Neg Hx          Objective:   Physical Exam BP 128/78 (BP Location: Left Arm, Patient Position: Sitting, Cuff Size: Normal)   Pulse (!) 58   Temp 98 F (36.7 C) (Oral)   Resp 14   Ht 6\' 1"  (1.854 m)   Wt 229 lb 4 oz (104 kg)   SpO2 98%   BMI 30.25 kg/m   General:   Well developed, well nourished . NAD.  Neck: No  thyromegaly . No JVD at 45 HEENT:  Normocephalic . Face symmetric, atraumatic Lungs:  CTA B Normal respiratory effort, no intercostal retractions, no accessory muscle use. Heart: RRR,  no murmur.  No pretibial  edema bilaterally  Abdomen:  Not distended, soft, non-tender. No rebound or rigidity.   Skin: Exposed areas without rash. Not pale. Not jaundice Rectal:  External abnormalities: none. Normal sphincter tone. No rectal masses or tenderness.  Stool brown  Prostate: Prostate gland firm and smooth, no enlargement, nodularity, tenderness, mass, asymmetry or induration.  Neurologic:  alert & oriented X3.  Speech normal, gait appropriate for age and unassisted Strength symmetric and appropriate for age.  Psych: Cognition and judgment appear intact.  Cooperative with normal attention span and concentration.  Behavior appropriate. No anxious or depressed appearing.    Assessment & Plan:   Assessment Prediabetes A1c 6.1 2015 HTN Hyperlipidemia GERD Elevated TSH 2014 H/o gout  DJD- hip Chronic back pain + PPD 2000, s/p  antibiotics   PLAN Prediabetes, last A1c satisfactory HTN: Good compliance with amlodipine, Tenormin, losartan. Last BMP satisfactory Hyperlipidemia: Diet control, checking labs DJD, needs a hip surgery. ROS (-), EKG sinus bradycardia. We are checking labs today, if they are okay he is clear for surgery.  Question, thyromegaly: Exam  today is negative RTC 6 months

## 2017-08-08 NOTE — Assessment & Plan Note (Signed)
Prediabetes, last A1c satisfactory HTN: Good compliance with amlodipine, Tenormin, losartan. Last BMP satisfactory Hyperlipidemia: Diet control, checking labs DJD, needs a hip surgery. ROS (-), EKG sinus bradycardia. We are checking labs today, if they are okay he is clear for surgery.  Question, thyromegaly: Exam today is negative RTC 6 months

## 2017-08-08 NOTE — Assessment & Plan Note (Addendum)
-  Td 2016 -[facility]: failed GI referral last year, likes to be re-refer ~12-2017, pt will call (to have hip surgery soon). -prostate ca screening: + FH prostate cancer, GF age 51, DRE (-) today, checking a PSA -Labs: LFTs, FLP, CBC, TSH, free T3, free T4, PSA -EKG sinus brady -diet-exercise discussed

## 2017-08-08 NOTE — Progress Notes (Signed)
Pre visit review using our clinic review tool, if applicable. No additional management support is needed unless otherwise documented below in the visit note. 

## 2017-08-08 NOTE — Patient Instructions (Signed)
GO TO THE LAB : Get the blood work     GO TO THE FRONT DESK Schedule your next appointment for a   checkup in 6 months 

## 2017-08-09 ENCOUNTER — Telehealth: Payer: Self-pay

## 2017-08-09 LAB — CBC WITH DIFFERENTIAL/PLATELET
Basophils Absolute: 0.1 10*3/uL (ref 0.0–0.1)
Basophils Relative: 0.9 % (ref 0.0–3.0)
Eosinophils Absolute: 0.2 10*3/uL (ref 0.0–0.7)
Eosinophils Relative: 2.2 % (ref 0.0–5.0)
HCT: 43.7 % (ref 39.0–52.0)
Hemoglobin: 14.5 g/dL (ref 13.0–17.0)
Lymphocytes Relative: 39.7 % (ref 12.0–46.0)
Lymphs Abs: 3.5 10*3/uL (ref 0.7–4.0)
MCHC: 33.1 g/dL (ref 30.0–36.0)
MCV: 87.9 fl (ref 78.0–100.0)
Monocytes Absolute: 0.7 10*3/uL (ref 0.1–1.0)
Monocytes Relative: 7.4 % (ref 3.0–12.0)
Neutro Abs: 4.4 10*3/uL (ref 1.4–7.7)
Neutrophils Relative %: 49.8 % (ref 43.0–77.0)
Platelets: 195 10*3/uL (ref 150.0–400.0)
RBC: 4.97 Mil/uL (ref 4.22–5.81)
RDW: 13.3 % (ref 11.5–15.5)
WBC: 8.9 10*3/uL (ref 4.0–10.5)

## 2017-08-09 LAB — PSA: PSA: 1.72 ng/mL (ref 0.10–4.00)

## 2017-08-09 LAB — LIPID PANEL
Cholesterol: 227 mg/dL — ABNORMAL HIGH (ref 0–200)
HDL: 35.8 mg/dL — ABNORMAL LOW (ref >39.00)
LDL Cholesterol: 159 mg/dL — ABNORMAL HIGH (ref 0–99)
NonHDL: 191.38
Total CHOL/HDL Ratio: 6
Triglycerides: 160 mg/dL — ABNORMAL HIGH (ref 0.0–149.0)
VLDL: 32 mg/dL (ref 0.0–40.0)

## 2017-08-09 LAB — HEPATIC FUNCTION PANEL
ALT: 46 U/L (ref 0–53)
AST: 30 U/L (ref 0–37)
Albumin: 4.5 g/dL (ref 3.5–5.2)
Alkaline Phosphatase: 110 U/L (ref 39–117)
Bilirubin, Direct: 0.1 mg/dL (ref 0.0–0.3)
Total Bilirubin: 0.7 mg/dL (ref 0.2–1.2)
Total Protein: 8 g/dL (ref 6.0–8.3)

## 2017-08-09 LAB — TSH: TSH: 0.84 u[IU]/mL (ref 0.35–4.50)

## 2017-08-09 LAB — T4, FREE: Free T4: 1.17 ng/dL (ref 0.60–1.60)

## 2017-08-09 LAB — T3, FREE: T3, Free: 4.3 pg/mL — ABNORMAL HIGH (ref 2.3–4.2)

## 2017-08-09 NOTE — Telephone Encounter (Signed)
Received fax confirmation

## 2017-08-09 NOTE — Telephone Encounter (Signed)
Pt cleared for surgery per PCP. Form and OV notes, labs and EKG from 08/08/2017 faxed to ATTN: Santiago Bur at [facility] at [phone].

## 2017-08-27 DIAGNOSIS — G894 Chronic pain syndrome: Secondary | ICD-10-CM | POA: Diagnosis not present

## 2017-12-03 ENCOUNTER — Other Ambulatory Visit: Payer: Self-pay | Admitting: Internal Medicine

## 2017-12-30 ENCOUNTER — Other Ambulatory Visit: Payer: Self-pay | Admitting: Internal Medicine

## 2018-02-08 ENCOUNTER — Ambulatory Visit: Payer: 59 | Admitting: Internal Medicine

## 2018-02-08 DIAGNOSIS — Z0289 Encounter for other administrative examinations: Secondary | ICD-10-CM

## 2018-02-12 ENCOUNTER — Encounter: Payer: Self-pay | Admitting: Internal Medicine

## 2018-03-04 ENCOUNTER — Other Ambulatory Visit: Payer: Self-pay | Admitting: Internal Medicine

## 2018-03-05 NOTE — Telephone Encounter (Signed)
Rx approved and sent to the pharmacy by e-script.//AB/CMA 

## 2018-03-07 ENCOUNTER — Other Ambulatory Visit: Payer: Self-pay

## 2018-03-08 ENCOUNTER — Encounter: Payer: Self-pay | Admitting: Internal Medicine

## 2018-03-08 ENCOUNTER — Ambulatory Visit: Payer: 59 | Admitting: Internal Medicine

## 2018-03-08 VITALS — BP 126/78 | HR 49 | Temp 98.0°F | Resp 14 | Ht 73.0 in | Wt 238.2 lb

## 2018-03-08 DIAGNOSIS — E785 Hyperlipidemia, unspecified: Secondary | ICD-10-CM

## 2018-03-08 DIAGNOSIS — I779 Disorder of arteries and arterioles, unspecified: Secondary | ICD-10-CM

## 2018-03-08 DIAGNOSIS — K219 Gastro-esophageal reflux disease without esophagitis: Secondary | ICD-10-CM

## 2018-03-08 DIAGNOSIS — I10 Essential (primary) hypertension: Secondary | ICD-10-CM

## 2018-03-08 DIAGNOSIS — R739 Hyperglycemia, unspecified: Secondary | ICD-10-CM

## 2018-03-08 DIAGNOSIS — R7989 Other specified abnormal findings of blood chemistry: Secondary | ICD-10-CM

## 2018-03-08 DIAGNOSIS — G4452 New daily persistent headache (NDPH): Secondary | ICD-10-CM

## 2018-03-08 DIAGNOSIS — I739 Peripheral vascular disease, unspecified: Secondary | ICD-10-CM

## 2018-03-08 LAB — TSH: TSH: 0.85 u[IU]/mL (ref 0.35–4.50)

## 2018-03-08 LAB — BASIC METABOLIC PANEL
BUN: 15 mg/dL (ref 6–23)
CO2: 27 mEq/L (ref 19–32)
Calcium: 9.5 mg/dL (ref 8.4–10.5)
Chloride: 105 mEq/L (ref 96–112)
Creatinine, Ser: 0.97 mg/dL (ref 0.40–1.50)
GFR: 104.46 mL/min (ref >60.00)
Glucose, Bld: 108 mg/dL — ABNORMAL HIGH (ref 70–99)
Potassium: 4.3 mEq/L (ref 3.5–5.1)
Sodium: 140 mEq/L (ref 135–145)

## 2018-03-08 LAB — LIPID PANEL
Cholesterol: 216 mg/dL — ABNORMAL HIGH (ref 0–200)
HDL: 39.5 mg/dL (ref >39.00)
LDL Cholesterol: 146 mg/dL — ABNORMAL HIGH (ref 0–99)
NonHDL: 176.72
Total CHOL/HDL Ratio: 5
Triglycerides: 156 mg/dL — ABNORMAL HIGH (ref 0.0–149.0)
VLDL: 31.2 mg/dL (ref 0.0–40.0)

## 2018-03-08 LAB — HEMOGLOBIN A1C: Hgb A1c MFr Bld: 6.1 % (ref 4.6–6.5)

## 2018-03-08 LAB — T4, FREE: Free T4: 0.93 ng/dL (ref 0.60–1.60)

## 2018-03-08 LAB — T3, FREE: T3, Free: 3.3 pg/mL (ref 2.3–4.2)

## 2018-03-08 MED ORDER — NORTRIPTYLINE HCL 10 MG PO CAPS: 10.0000 mg | ORAL_CAPSULE | Freq: Every day | ORAL | 3 refills | Status: DC

## 2018-03-08 MED ORDER — OMEPRAZOLE 20 MG PO CPDR: 20.0000 mg | DELAYED_RELEASE_CAPSULE | Freq: Every day | ORAL | 3 refills | Status: DC

## 2018-03-08 MED ORDER — ATENOLOL 50 MG PO TABS: 50.0000 mg | ORAL_TABLET | Freq: Every day | ORAL | 1 refills | Status: DC

## 2018-03-08 MED ORDER — AMLODIPINE BESYLATE 10 MG PO TABS: 10.0000 mg | ORAL_TABLET | Freq: Every day | ORAL | 1 refills | Status: DC

## 2018-03-08 MED ORDER — LOSARTAN POTASSIUM 100 MG PO TABS: 100.0000 mg | ORAL_TABLET | Freq: Every day | ORAL | 1 refills | Status: DC

## 2018-03-08 NOTE — Assessment & Plan Note (Signed)
Prediabetes: Encourage to do the best he can with diet and exercise.  Check a A1c HTN: Seems controlled on amlodipine, Tenormin, losartan.  Check BMP. Hyperlipidemia: Last LDL elevated, working on improving his lifestyle.  Check a FLP GERD: Likes to switch to omeprazole.  Will do Elevated TSH: Recheck TFTs Headache: New onset at age 52.  Although there is no major red flags other than age and the neuro exam is normal will proceed with brain MRI to be sure.  DDX: Migraine new onset, tensional?  Also rec nortriptyline nightly. Asked patient to call me in 4 weeks and let me know if he is better. RTC 3 months

## 2018-03-08 NOTE — Progress Notes (Signed)
Pre visit review using our clinic review tool, if applicable. No additional management support is needed unless otherwise documented below in the visit note. 

## 2018-03-08 NOTE — Progress Notes (Signed)
Subjective:    Patient ID: Mike Shepard, male    DOB: 1966/05/24, 52 y.o.   MRN: [id number]  DOS:  03/08/2018 Type of visit - description : f/u Interval history: His main concern today is headache, they started a few months ago, having a episode every other day, they may last 1 or 2 hours, not severe, can be located at the left, right or top of the head.  Goody powders do help. Sometimes have more than one episode a day. No nausea . No visual disturbances  High cholesterol: Trying to improve his diet HTN: Good compliance with medication.  Review of Systems Denies neck pain per se.  Denies sinus congestion, discharge. Stress at baseline Denies dizziness, diplopia, slurred speech or motor deficits  Past Medical History:  Diagnosis Date  . Chronic back pain    see surgeries   . GERD (gastroesophageal reflux disease)   . H/O: gout   . Hyperlipidemia   . Hypertension   . PPD positive    Positive PPD, remotely ~2000, s/p Rx    . Primary osteoarthritis of right hip    end stage    Past Surgical History:  Procedure Laterality Date  . BACK SURGERY     2007-2013 (low back)  . NECK SURGERY     2010    Social History   Socioeconomic History  . Marital status: Married    Spouse name: Not on file  . Number of children: 3  . Years of education: Not on file  . Highest education level: Not on file  Occupational History  . 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 Financial resource strain: Not on file  . Food insecurity:    Worry: Not on file    Inability: Not on file  . Transportation needs:    Medical: Not on file    Non-medical: Not on file  Tobacco Use  . Smoking status: Former Smoker    Types: Cigarettes    Last attempt to quit: 01/27/2010    Years since quitting: 8.1  . Smokeless tobacco: Current User    Types: Chew  . Tobacco comment: chews sometimes; quit smoking~ 12-2014, quit vaping     Substance and Sexual Activity  . Alcohol use: Yes   Alcohol/week: 1.2 oz    Types: 1 Glasses of wine, 1 Cans of beer per week    Comment: socially   . Drug use: No    Types: Hydrocodone    Comment: denies   . Sexual activity: Yes    Birth control/protection: None  Lifestyle  . Physical activity:    Days per week: Not on file    Minutes per session: Not on file  . Stress: Not on file  Relationships  . Social connections:    Talks on phone: Not on file    Gets together: Not on file    Attends religious service: Not on file    Active member of club or organization: Not on file    Attends meetings of clubs or organizations: Not on file    Relationship status: Not on file  . Intimate partner violence:    Fear of current or ex partner: Not on file    Emotionally abused: Not on file    Physically abused: Not on file    Forced sexual activity: Not on file  Other Topics Concern  . Not on file  Social History Narrative   Lives w/ wife, 3 children plus  adopted 3 children      Allergies as of 03/08/2018   No Known Allergies     Medication List        Accurate as of 03/08/18 11:42 AM. Always use your most recent med list.          amLODipine 10 MG tablet Commonly known as:  NORVASC Take 1 tablet (10 mg total) by mouth daily.   atenolol 50 MG tablet Commonly known as:  TENORMIN TAKE 1 TABLET BY MOUTH ONCE DAILY   celecoxib 200 MG capsule Commonly known as:  CELEBREX Take 1 capsule (200 mg total) by mouth daily.   HYDROcodone-acetaminophen 5-325 MG tablet Commonly known as:  NORCO/VICODIN Take 1-2 tablets by mouth 2 (two) times daily as needed.   lansoprazole 15 MG capsule Commonly known as:  PREVACID Take 1 capsule (15 mg total) by mouth daily.   losartan 100 MG tablet Commonly known as:  COZAAR Take 1 tablet (100 mg total) by mouth daily.   multivitamin with minerals Tabs tablet Take 1 tablet by mouth daily.          Objective:   Physical Exam BP 126/78 (BP Location: Left Arm, Patient Position: Sitting,  Cuff Size: Normal)   Pulse (!) 49   Temp 98 F (36.7 C) (Oral)   Resp 14   Ht 6\' 1"  (1.854 m)   Wt 238 lb 4 oz (108.1 kg)   SpO2 97%   BMI 31.43 kg/m  General:   Well developed, well nourished . NAD.  HEENT:  Normocephalic . Face symmetric, atraumatic Neck: No TTP of the cervical spine, range of motion normal Lungs:  CTA B Normal respiratory effort, no intercostal retractions, no accessory muscle use. Heart: RRR,  no murmur.  No pretibial edema bilaterally  Skin: Not pale. Not jaundice Neurologic:  alert & oriented X3.  Speech normal, gait appropriate for age and unassisted. Motor symmetric, DTRs symmetric.  EOMI Psych--  Cognition and judgment appear intact.  Cooperative with normal attention span and concentration.  Behavior appropriate. No anxious or depressed appearing.      Assessment & Plan:   Assessment Prediabetes A1c 6.1 2015 HTN Hyperlipidemia GERD Elevated TSH 2014 H/o gout  DJD- hip Chronic back pain + PPD 2000, s/p  antibiotics   PLAN Prediabetes: Encourage to do the best he can with diet and exercise.  Check a A1c HTN: Seems controlled on amlodipine, Tenormin, losartan.  Check BMP. Hyperlipidemia: Last LDL elevated, working on improving his lifestyle.  Check a FLP GERD: Likes to switch to omeprazole.  Will do Elevated TSH: Recheck TFTs Headache: New onset at age 89.  Although there is no major red flags other than age and the neuro exam is normal will proceed with brain MRI to be sure.  DDX: Migraine new onset, tensional?  Also rec nortriptyline nightly. Asked patient to call me in 4 weeks and let me know if he is better. RTC 3 months

## 2018-03-08 NOTE — Patient Instructions (Addendum)
GO TO THE LAB : Get the blood work     GO TO THE FRONT DESK Schedule your next appointment for a checkup in 3 months  For headaches: Start nortriptyline 10 mg, 1 or 2 tablets at nighttime Call me in 4 weeks and let me know how you are doing     DIABETES, cholesterol  self learn tools:  Get the book "Diabetes for Dummies" Get the book: "The [facility] Diabetes diet"   Online resources: The American diabetes Association     diabetes.[facility] website it is a Microbiologist  [place].org  The [facility] web site has a diabetes section  [employer]

## 2018-03-16 ENCOUNTER — Ambulatory Visit (HOSPITAL_BASED_OUTPATIENT_CLINIC_OR_DEPARTMENT_OTHER)
Admission: RE | Admit: 2018-03-16 | Discharge: 2018-03-16 | Disposition: A | Discharge status: Home / Self Care | Payer: 59 | Source: Ambulatory Visit | Attending: Internal Medicine | Admitting: Internal Medicine

## 2018-03-16 DIAGNOSIS — G4452 New daily persistent headache (NDPH): Secondary | ICD-10-CM | POA: Insufficient documentation

## 2018-03-16 DIAGNOSIS — R51 Headache: Secondary | ICD-10-CM | POA: Diagnosis not present

## 2018-03-16 DIAGNOSIS — I639 Cerebral infarction, unspecified: Secondary | ICD-10-CM | POA: Insufficient documentation

## 2018-03-18 NOTE — Addendum Note (Signed)
Addended byDamita Dunnings D on: 03/18/2018 08:32 AM   Modules accepted: Orders

## 2018-05-10 ENCOUNTER — Telehealth: Payer: Self-pay | Admitting: Internal Medicine

## 2018-05-10 DIAGNOSIS — I779 Disorder of arteries and arterioles, unspecified: Secondary | ICD-10-CM

## 2018-05-10 DIAGNOSIS — I739 Peripheral vascular disease, unspecified: Principal | ICD-10-CM

## 2018-05-10 NOTE — Telephone Encounter (Signed)
noted 

## 2018-05-10 NOTE — Telephone Encounter (Signed)
Unsure why carotid ultrasound was not completed, reordered. Pt has appt 06/14/2018. Headaches are much better.    At end of conversation he informed me that his pharmacy has informed him that losartan 100mg  is no longer being made (recall?)- he'd like to come in and discuss other BP med options. Appt scheduled for 05/14/2018 at 0920.

## 2018-05-10 NOTE — Telephone Encounter (Signed)
Please call patient: Was seen with headaches, are they better? Needs also office visit for a follow-up and a carotid ultrasound, I do not see that he has proceed with the ultrasound.

## 2018-05-14 ENCOUNTER — Encounter: Payer: Self-pay | Admitting: Internal Medicine

## 2018-05-14 ENCOUNTER — Ambulatory Visit: Payer: 59 | Admitting: Internal Medicine

## 2018-05-14 VITALS — BP 132/70 | HR 69 | Temp 98.2°F | Resp 14 | Ht 73.0 in | Wt 234.2 lb

## 2018-05-14 DIAGNOSIS — R739 Hyperglycemia, unspecified: Secondary | ICD-10-CM

## 2018-05-14 DIAGNOSIS — Z8673 Personal history of transient ischemic attack (TIA), and cerebral infarction without residual deficits: Secondary | ICD-10-CM | POA: Insufficient documentation

## 2018-05-14 DIAGNOSIS — E785 Hyperlipidemia, unspecified: Secondary | ICD-10-CM | POA: Diagnosis not present

## 2018-05-14 DIAGNOSIS — I10 Essential (primary) hypertension: Secondary | ICD-10-CM | POA: Diagnosis not present

## 2018-05-14 MED ORDER — LOSARTAN POTASSIUM 100 MG PO TABS: 100.0000 mg | ORAL_TABLET | Freq: Every day | ORAL | 1 refills | Status: DC

## 2018-05-14 MED ORDER — AMLODIPINE BESYLATE 10 MG PO TABS: 10.0000 mg | ORAL_TABLET | Freq: Every day | ORAL | 1 refills | Status: DC

## 2018-05-14 MED ORDER — ATENOLOL 50 MG PO TABS: 50.0000 mg | ORAL_TABLET | Freq: Every day | ORAL | 1 refills | Status: DC

## 2018-05-14 NOTE — Patient Instructions (Signed)
GO TO THE FRONT DESK Schedule your next appointment for a   Physical in 3 or 4 months from today

## 2018-05-14 NOTE — Progress Notes (Signed)
Pre visit review using our clinic review tool, if applicable. No additional management support is needed unless otherwise documented below in the visit note. 

## 2018-05-14 NOTE — Progress Notes (Signed)
Subjective:    Patient ID: Mike Shepard, male    DOB: [birth year], 52 y.o.   MRN: [id number]  DOS:  05/14/2018 Type of visit - description : Routine visit Interval history: HTN: Good compliance with medication. Abnormal brain MRI: Report reviewed with the patient Hyperglycemia: Last A1c 6.1 Headaches: Essentially resolved, minimal headache occasionally.  On amitriptyline which seems to help   Review of Systems   Past Medical History:  Diagnosis Date  . Chronic back pain    see surgeries   . GERD (gastroesophageal reflux disease)   . H/O: gout   . Hyperlipidemia   . Hypertension   . PPD positive    Positive PPD, remotely ~2000, s/p Rx    . Primary osteoarthritis of right hip    end stage    Past Surgical History:  Procedure Laterality Date  . BACK SURGERY     2007-2013 (low back)  . NECK SURGERY     2010    Social History   Socioeconomic History  . Marital status: Married    Spouse name: Not on file  . Number of children: 3  . Years of education: Not on file  . Highest education level: Not on file  Occupational History  . 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 Financial resource strain: Not on file  . Food insecurity:    Worry: Not on file    Inability: Not on file  . Transportation needs:    Medical: Not on file    Non-medical: Not on file  Tobacco Use  . Smoking status: Former Smoker    Types: Cigarettes    Last attempt to quit: 01/27/2010    Years since quitting: 8.2  . Smokeless tobacco: Current User    Types: Chew  . Tobacco comment: chews sometimes; quit smoking~ 12-2014, quit vaping     Substance and Sexual Activity  . Alcohol use: Yes    Alcohol/week: 1.2 oz    Types: 1 Glasses of wine, 1 Cans of beer per week    Comment: socially   . Drug use: No    Types: Hydrocodone    Comment: denies   . Sexual activity: Yes    Birth control/protection: None  Lifestyle  . Physical activity:    Days per week: Not on file    Minutes  per session: Not on file  . Stress: Not on file  Relationships  . Social connections:    Talks on phone: Not on file    Gets together: Not on file    Attends religious service: Not on file    Active member of club or organization: Not on file    Attends meetings of clubs or organizations: Not on file    Relationship status: Not on file  . Intimate partner violence:    Fear of current or ex partner: Not on file    Emotionally abused: Not on file    Physically abused: Not on file    Forced sexual activity: Not on file  Other Topics Concern  . Not on file  Social History Narrative   Lives w/ wife, 3 children plus adopted 3 children      Allergies as of 05/14/2018   No Known Allergies     Medication List        Accurate as of 05/14/18  7:01 PM. Always use your most recent med list.          amLODipine 10 MG tablet  Commonly known as:  NORVASC Take 1 tablet (10 mg total) by mouth daily.   atenolol 50 MG tablet Commonly known as:  TENORMIN Take 1 tablet (50 mg total) by mouth daily.   celecoxib 200 MG capsule Commonly known as:  CELEBREX Take 1 capsule (200 mg total) by mouth daily.   losartan 100 MG tablet Commonly known as:  COZAAR Take 1 tablet (100 mg total) by mouth daily.   multivitamin with minerals Tabs tablet Take 1 tablet by mouth daily.   nortriptyline 10 MG capsule Commonly known as:  PAMELOR Take 1-2 capsules (10-20 mg total) by mouth at bedtime.   omeprazole 20 MG capsule Commonly known as:  PRILOSEC Take 1 capsule (20 mg total) by mouth daily before breakfast.          Objective:   Physical Exam BP 132/70 (BP Location: Left Arm, Patient Position: Sitting, Cuff Size: Normal)   Pulse 69   Temp 98.2 F (36.8 C) (Oral)   Resp 14   Ht 6\' 1"  (1.854 m)   Wt 234 lb 4 oz (106.3 kg)   SpO2 97%   BMI 30.91 kg/m  General:   Well developed, well nourished . NAD.  HEENT:  Normocephalic . Face symmetric, atraumatic Neck exam: Carotid pulses  normal, no bruit Lungs:  CTA B Normal respiratory effort, no intercostal retractions, no accessory muscle use. Heart: RRR,  no murmur.  No pretibial edema bilaterally  Skin: Not pale. Not jaundice Neurologic:  alert & oriented X3.  Speech normal, gait appropriate for age and unassisted Psych--  Cognition and judgment appear intact.  Cooperative with normal attention span and concentration.  Behavior appropriate. No anxious or depressed appearing.      Assessment & Plan:   Assessment Prediabetes A1c 6.1 2015 HTN Hyperlipidemia MRI 02-2018: Remote infarct, remote left basal ganglia and corona radiator perforated infarct GERD Elevated TSH 2014 H/o gout  DJD- hip Chronic back pain + PPD 2000, s/p  antibiotics   PLAN Prediabetes: Last A1c satisfactory HTN: Currently on amlodipine, Tenormin, losartan.  I spoke with the pharmacist today, losartan is again available.  RF sent CVA: Abnormal brain MRI, previous infarct.  Based on that needs better cholesterol control. Carotid disease: Suspect high-grade cervical carotid stenosis in the neck per  MRI 02-2018, patient states today he will proceed with a carotid ultrasound Hyperlipidemia: Last LDL 146, goal is close to 100.  We talked about adding a medication, he prefers to work on his lifestyle and recheck when he comes back HA See last visit, improved, continue nortriptyline. RTC, CPX, 3 to 4 months.

## 2018-05-14 NOTE — Assessment & Plan Note (Signed)
Prediabetes: Last A1c satisfactory HTN: Currently on amlodipine, Tenormin, losartan.  I spoke with the pharmacist today, losartan is again available.  RF sent CVA: Abnormal brain MRI, previous infarct.  Based on that needs better cholesterol control. Carotid disease: Suspect high-grade cervical carotid stenosis in the neck per  MRI 02-2018, patient states today he will proceed with a carotid ultrasound Hyperlipidemia: Last LDL 146, goal is close to 100.  We talked about adding a medication, he prefers to work on his lifestyle and recheck when he comes back HA See last visit, improved, continue nortriptyline. RTC, CPX, 3 to 4 months.

## 2018-06-14 ENCOUNTER — Ambulatory Visit: Payer: 59 | Admitting: Internal Medicine

## 2018-06-21 ENCOUNTER — Telehealth: Payer: Self-pay | Admitting: Internal Medicine

## 2018-06-21 NOTE — Telephone Encounter (Unsigned)
Copied from [place] [id number]. Topic: Quick Communication - Rx Refill/Question >> Jun 21, 2018  1:05 PM Mcneil, Ja-Kwan wrote: Medication: losartan (COZAAR) 100 MG tablet  Has the patient contacted their pharmacy? yes   Preferred Pharmacy (with phone number or street name): [facility], [address]. [phone] (Phone) [phone] (Fax)   Agent: Please be advised that RX refills may take up to 3 business days. We ask that you follow-up with your pharmacy.

## 2018-06-21 NOTE — Telephone Encounter (Signed)
Called pt. To discuss his request for refill on Losartan; reported he was told by pharmacist that there are no refills.  Noted last refill on 05/14/18; # 90; refill x 1.   [facility] [address].  Was informed that the insurance is only allowing a "30 day supply."  Pharmacy Tech confirmed that there is a prescription on file for the Losartan from both March and May; they have one Rx ready for the pt. to pick up.  Advised pt. of this information.  Verb. Understanding.

## 2018-08-14 ENCOUNTER — Ambulatory Visit (INDEPENDENT_AMBULATORY_CARE_PROVIDER_SITE_OTHER): Payer: 59 | Admitting: Internal Medicine

## 2018-08-14 ENCOUNTER — Encounter: Payer: Self-pay | Admitting: Gastroenterology

## 2018-08-14 ENCOUNTER — Encounter: Payer: Self-pay | Admitting: Internal Medicine

## 2018-08-14 VITALS — BP 142/84 | HR 59 | Temp 98.0°F | Resp 16 | Ht 73.0 in | Wt 238.4 lb

## 2018-08-14 DIAGNOSIS — Z0000 Encounter for general adult medical examination without abnormal findings: Secondary | ICD-10-CM | POA: Diagnosis not present

## 2018-08-14 DIAGNOSIS — I739 Peripheral vascular disease, unspecified: Secondary | ICD-10-CM

## 2018-08-14 DIAGNOSIS — R739 Hyperglycemia, unspecified: Secondary | ICD-10-CM | POA: Diagnosis not present

## 2018-08-14 DIAGNOSIS — E785 Hyperlipidemia, unspecified: Secondary | ICD-10-CM | POA: Diagnosis not present

## 2018-08-14 DIAGNOSIS — Z1211 Encounter for screening for malignant neoplasm of colon: Secondary | ICD-10-CM

## 2018-08-14 DIAGNOSIS — I779 Disorder of arteries and arterioles, unspecified: Secondary | ICD-10-CM

## 2018-08-14 LAB — COMPREHENSIVE METABOLIC PANEL
ALT: 45 U/L (ref 0–53)
AST: 27 U/L (ref 0–37)
Albumin: 4.1 g/dL (ref 3.5–5.2)
Alkaline Phosphatase: 118 U/L — ABNORMAL HIGH (ref 39–117)
BUN: 11 mg/dL (ref 6–23)
CO2: 27 mEq/L (ref 19–32)
Calcium: 9.7 mg/dL (ref 8.4–10.5)
Chloride: 104 mEq/L (ref 96–112)
Creatinine, Ser: 0.99 mg/dL (ref 0.40–1.50)
GFR: 101.85 mL/min (ref >60.00)
Glucose, Bld: 108 mg/dL — ABNORMAL HIGH (ref 70–99)
Potassium: 4.5 mEq/L (ref 3.5–5.1)
Sodium: 138 mEq/L (ref 135–145)
Total Bilirubin: 0.7 mg/dL (ref 0.2–1.2)
Total Protein: 7.3 g/dL (ref 6.0–8.3)

## 2018-08-14 LAB — LIPID PANEL
Cholesterol: 208 mg/dL — ABNORMAL HIGH (ref 0–200)
HDL: 40.3 mg/dL (ref >39.00)
LDL Cholesterol: 134 mg/dL — ABNORMAL HIGH (ref 0–99)
NonHDL: 167.88
Total CHOL/HDL Ratio: 5
Triglycerides: 168 mg/dL — ABNORMAL HIGH (ref 0.0–149.0)
VLDL: 33.6 mg/dL (ref 0.0–40.0)

## 2018-08-14 LAB — CBC
HCT: 43.9 % (ref 39.0–52.0)
Hemoglobin: 15 g/dL (ref 13.0–17.0)
MCHC: 34.2 g/dL (ref 30.0–36.0)
MCV: 85.4 fl (ref 78.0–100.0)
Platelets: 229 10*3/uL (ref 150.0–400.0)
RBC: 5.15 Mil/uL (ref 4.22–5.81)
RDW: 12.8 % (ref 11.5–15.5)
WBC: 4.6 10*3/uL (ref 4.0–10.5)

## 2018-08-14 LAB — HEMOGLOBIN A1C: Hgb A1c MFr Bld: 6.1 % (ref 4.6–6.5)

## 2018-08-14 MED ORDER — CARVEDILOL 25 MG PO TABS: 25.0000 mg | ORAL_TABLET | Freq: Two times a day (BID) | ORAL | 6 refills | Status: DC

## 2018-08-14 NOTE — Progress Notes (Signed)
Subjective:    Patient ID: Mike Shepard, male    DOB: [birth year], 52 y.o.   MRN: [id number]  DOS:  08/14/2018 Type of visit - description : cpx Interval history:  Since the last office visit, he is doing well and has no major concerns  Review of Systems Specifically denies chest pain, difficulty breathing. No stroke like symptoms such as diplopia, facial numbness or slurred speech.  Other than above, a 14 point review of systems is negative    Past Medical History:  Diagnosis Date  . Chronic back pain    see surgeries   . GERD (gastroesophageal reflux disease)   . H/O: gout   . Hyperlipidemia   . Hypertension   . PPD positive    Positive PPD, remotely ~2000, s/p Rx    . Primary osteoarthritis of right hip    end stage    Past Surgical History:  Procedure Laterality Date  . BACK SURGERY     2007-2013 (low back)  . NECK SURGERY     2010    Social History   Socioeconomic History  . Marital status: Married    Spouse name: Not on file  . Number of children: 3  . Years of education: Not on file  . Highest education level: Not on file  Occupational History  . 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 Financial resource strain: Not on file  . Food insecurity:    Worry: Not on file    Inability: Not on file  . Transportation needs:    Medical: Not on file    Non-medical: Not on file  Tobacco Use  . Smoking status: Former Smoker    Types: Cigarettes    Last attempt to quit: 01/27/2010    Years since quitting: 8.5  . Smokeless tobacco: Current User    Types: Chew  . Tobacco comment: chews sometimes; quit smoking~ 12-2014, quit vaping     Substance and Sexual Activity  . Alcohol use: Yes    Alcohol/week: 2.0 standard drinks    Types: 1 Glasses of wine, 1 Cans of beer per week    Comment: socially   . Drug use: No    Comment: denies   . Sexual activity: Yes    Birth control/protection: None  Lifestyle  . Physical activity:    Days per week:  Not on file    Minutes per session: Not on file  . Stress: Not on file  Relationships  . Social connections:    Talks on phone: Not on file    Gets together: Not on file    Attends religious service: Not on file    Active member of club or organization: Not on file    Attends meetings of clubs or organizations: Not on file    Relationship status: Not on file  . Intimate partner violence:    Fear of current or ex partner: Not on file    Emotionally abused: Not on file    Physically abused: Not on file    Forced sexual activity: Not on file  Other Topics Concern  . Not on file  Social History Narrative   Lives w/ wife, 3 children plus adopted 3 children     Family History  Problem Relation Age of Onset  . Hypertension Mother        M,F,Sis, bro  . Diabetes Father        F, M. B  . Diabetes Sister   .  Hypertension Sister   . Diabetes Brother   . Hypertension Brother   . Prostate cancer Paternal Grandfather        dx ~ 21 y/o  . Anesthesia problems Neg Hx   . Colon cancer Neg Hx   . CAD Neg Hx   . Stroke Neg Hx      Allergies as of 08/14/2018   No Known Allergies     Medication List        Accurate as of 08/14/18  7:21 PM. Always use your most recent med list.          amLODipine 10 MG tablet Commonly known as:  NORVASC Take 1 tablet (10 mg total) by mouth daily.   carvedilol 25 MG tablet Commonly known as:  COREG Take 1 tablet (25 mg total) by mouth 2 (two) times daily with a meal.   celecoxib 200 MG capsule Commonly known as:  CELEBREX Take 1 capsule (200 mg total) by mouth daily.   losartan 100 MG tablet Commonly known as:  COZAAR Take 1 tablet (100 mg total) by mouth daily.   multivitamin with minerals Tabs tablet Take 1 tablet by mouth daily.   omeprazole 20 MG capsule Commonly known as:  PRILOSEC Take 1 capsule (20 mg total) by mouth daily before breakfast.          Objective:   Physical Exam BP (!) 142/84 (BP Location: Right Arm,  Patient Position: Sitting, Cuff Size: Large)   Pulse (!) 59   Temp 98 F (36.7 C) (Oral)   Resp 16   Ht 6\' 1"  (1.854 m)   Wt 238 lb 6.4 oz (108.1 kg)   SpO2 96%   BMI 31.45 kg/m  General: Well developed, NAD, see BMI.  Neck: Carotid pulses present, no bruit HEENT:  Normocephalic . Face symmetric, atraumatic Lungs:  CTA B Normal respiratory effort, no intercostal retractions, no accessory muscle use. Heart: RRR,  no murmur.  No pretibial edema bilaterally  Abdomen:  Not distended, soft, non-tender. No rebound or rigidity.   Skin: Exposed areas without rash. Not pale. Not jaundice Neurologic:  alert & oriented X3.  Speech normal, gait appropriate for age and unassisted Strength symmetric and appropriate for age.  Psych: Cognition and judgment appear intact.  Cooperative with normal attention span and concentration.  Behavior appropriate. No anxious or depressed appearing.     Assessment & Plan:    Assessment Prediabetes A1c 6.1 2015 HTN Hyperlipidemia MRI- brain  02-2018: Remote infarct, remote left basal ganglia and corona radiator perforated infarct GERD Elevated TSH 2014 H/o gout  DJD- hip Chronic back pain + PPD 2000, s/p  antibiotics   PLAN Prediabetes: Check A1c HTN: Ambulatory BPs at home range from the 130 to 160, often times in the high side.  Currently on amlodipine 10 mg, Tenormin 50 mg, losartan 100 mg.  Pulse today 59.  Plan: Change BB to carvedilol 25 mg twice daily.  Call w/ ambulatory readings in 3 weeks. Hyperlipidemia: Currently on diet only, needs a strict control due to history of a stroke on the MRI.  Labs today. Carotid artery disease: Failed referral for a carotid  US but states he will go, rearrange. Headaches: Off nortriptyline, no further symptoms.  Observation Pain, Hip: Takes Celebrex rarely,  patient elected to hold hip surgery for now RTC 3 months

## 2018-08-14 NOTE — Patient Instructions (Signed)
GO TO THE LAB : Get the blood work    GO TO THE FRONT DESK Schedule your next appointment for a checkup in 3 months  Proceed with a carotid ultrasound  Proceed with a colonoscopy  Stop atenolol  Start carvedilol 25 mg 1 tablet twice a day  Check the  blood pressure 2 or 3 times a  week monthly weekly daily Be sure your blood pressure is between 110/65 and  135/85. Please call with your BP readings in 2 weeks  GENERAL INFORMATION ABOUT HEALTHY EATING,  QUIT TOBACCO (if you smoke): The American Heart Association, www.heart.org  Check the "Life's Simple 7" from the [facility] The American diabetes Association www.diabetes.org

## 2018-08-14 NOTE — Assessment & Plan Note (Addendum)
-  Td 2016 -CCS: failed GI referral before, request another referral.  Will do. -prostate ca screening: + FH prostate cancer, GF age 52; DRE - PSA wnl 2018, reassess next year -Labs: BMP CMP, FLP, CBC, A1c --Diet exercise discussed --Tobacco use: +dip, recommend to see dentist regularly, encouraged to quit. Nicotine supplements?  Wellbutrin?  Chantix?

## 2018-08-14 NOTE — Assessment & Plan Note (Signed)
Prediabetes: Check A1c HTN: Ambulatory BPs at home range from the 130 to 160, often times in the high side.  Currently on amlodipine 10 mg, Tenormin 50 mg, losartan 100 mg.  Pulse today 59.  Plan: Change BB to carvedilol 25 mg twice daily.  Call w/ ambulatory readings in 3 weeks. Hyperlipidemia: Currently on diet only, needs a strict control due to history of a stroke on the MRI.  Labs today. Carotid artery disease: Failed referral for a carotid  US but states he will go, rearrange. Headaches: Off nortriptyline, no further symptoms.  Observation Pain, Hip: Takes Celebrex rarely,  patient elected to hold hip surgery for now RTC 3 months

## 2018-08-21 MED ORDER — ATORVASTATIN CALCIUM 20 MG PO TABS: 20.0000 mg | ORAL_TABLET | Freq: Every day | ORAL | 3 refills | Status: DC

## 2018-08-21 NOTE — Addendum Note (Signed)
Addended byDamita Dunnings D on: 08/21/2018 11:22 AM   Modules accepted: Orders

## 2018-08-23 ENCOUNTER — Ambulatory Visit (HOSPITAL_BASED_OUTPATIENT_CLINIC_OR_DEPARTMENT_OTHER): Payer: 59

## 2018-08-30 ENCOUNTER — Ambulatory Visit (HOSPITAL_BASED_OUTPATIENT_CLINIC_OR_DEPARTMENT_OTHER): Payer: 59

## 2018-09-25 ENCOUNTER — Telehealth: Payer: Self-pay

## 2018-09-25 MED ORDER — ATENOLOL 100 MG PO TABS: 100.0000 mg | ORAL_TABLET | Freq: Every day | ORAL | 6 refills | Status: DC

## 2018-09-25 NOTE — Telephone Encounter (Signed)
Copied from [place]. Topic: General - Other >> Sep 25, 2018  9:08 AM Janace Aris A wrote: Reason for CRM: Patient called in wanting to go back to his old BP medication atenolol (TENORMIN) 50 MG tablet, he says the new BP medication carvedilol (COREG) 25 MG tablet   that he was prescribed is not working at all and it's actually making his blood pressure run higher.  Please advise

## 2018-09-25 NOTE — Telephone Encounter (Signed)
LMOM informing Pt to return call. Atenolol 100mg  sent to pharmacy. Okay for PEC to discuss.

## 2018-09-25 NOTE — Telephone Encounter (Signed)
Okay, stop carvedilol Go back on Tenormin, will increase to 100 mg 1 tablet daily, send a prescription (was taking 50 mg but BP was elevated). Patient to monitor BPs and heart rate (should not be less than 50-55). Call if BP not well controlled.

## 2018-10-04 ENCOUNTER — Telehealth: Payer: Self-pay

## 2018-10-04 NOTE — Telephone Encounter (Signed)
Left message for pt to call back today by 5 pm to RS PV. NS today @ 9am. Advised will cancel colon if no return call.

## 2018-10-11 ENCOUNTER — Encounter: Payer: 59 | Admitting: Gastroenterology

## 2018-11-22 ENCOUNTER — Ambulatory Visit: Payer: 59 | Admitting: Internal Medicine

## 2018-11-22 ENCOUNTER — Encounter: Payer: Self-pay | Admitting: Internal Medicine

## 2018-11-22 DIAGNOSIS — Z0289 Encounter for other administrative examinations: Secondary | ICD-10-CM

## 2019-03-17 ENCOUNTER — Ambulatory Visit (INDEPENDENT_AMBULATORY_CARE_PROVIDER_SITE_OTHER): Payer: 59 | Admitting: Internal Medicine

## 2019-03-17 ENCOUNTER — Other Ambulatory Visit: Payer: Self-pay

## 2019-03-17 DIAGNOSIS — I10 Essential (primary) hypertension: Secondary | ICD-10-CM | POA: Diagnosis not present

## 2019-03-17 DIAGNOSIS — R739 Hyperglycemia, unspecified: Secondary | ICD-10-CM

## 2019-03-17 DIAGNOSIS — E785 Hyperlipidemia, unspecified: Secondary | ICD-10-CM

## 2019-03-17 NOTE — Progress Notes (Signed)
Subjective:    Patient ID: Mike Shepard, male    DOB: [birth year], 53 y.o.   MRN: [id number]  DOS:  03/17/2019 Type of visit - description: Virtual Visit via Video Note  I connected with@ on 03/17/19 at  2:20 PM EDT by a video enabled telemedicine application and verified that I am speaking with the correct person using two identifiers.   THIS ENCOUNTER IS A VIRTUAL VISIT DUE TO COVID-19 - PATIENT WAS NOT SEEN IN THE OFFICE. PATIENT HAS CONSENTED TO VIRTUAL VISIT / TELEMEDICINE VISIT   Location of patient: home  Location of provider: office  I discussed the limitations of evaluation and management by telemedicine and the availability of in person appointments. The patient expressed understanding and agreed to proceed.  History of Present Illness: The patient reports he is doing well in general. High cholesterol: Started atorvastatin, no apparent side effects. HTN: Good compliance with medications, ambulatory BPs when checked normal. Hyperglycemia: Mild, he remains active, he works for the [employer] and he is still going to work despite the coronavirus pandemia    Review of Systems Denies chest pain difficulty breathing. No nausea, vomiting, diarrhea No unusual aches or pains  Past Medical History:  Diagnosis Date  . Chronic back pain    see surgeries   . GERD (gastroesophageal reflux disease)   . H/O: gout   . Hyperlipidemia   . Hypertension   . PPD positive    Positive PPD, remotely ~2000, s/p Rx    . Primary osteoarthritis of right hip    end stage    Past Surgical History:  Procedure Laterality Date  . BACK SURGERY     2007-2013 (low back)  . NECK SURGERY     2010    Social History   Socioeconomic History  . Marital status: Married    Spouse name: Not on file  . Number of children: 3  . Years of education: Not on file  . Highest education level: Not on file  Occupational History  . Occupation: [employer] [place] [place]  Social Needs  .  Financial resource strain: Not on file  . Food insecurity:    Worry: Not on file    Inability: Not on file  . Transportation needs:    Medical: Not on file    Non-medical: Not on file  Tobacco Use  . Smoking status: Former Smoker    Types: Cigarettes    Last attempt to quit: 01/27/2010    Years since quitting: 9.1  . Smokeless tobacco: Current User    Types: Chew  . Tobacco comment: chews sometimes; quit smoking~ 12-2014, quit vaping     Substance and Sexual Activity  . Alcohol use: Yes    Alcohol/week: 2.0 standard drinks    Types: 1 Glasses of wine, 1 Cans of beer per week    Comment: socially   . Drug use: No    Comment: denies   . Sexual activity: Yes    Birth control/protection: None  Lifestyle  . Physical activity:    Days per week: Not on file    Minutes per session: Not on file  . Stress: Not on file  Relationships  . Social connections:    Talks on phone: Not on file    Gets together: Not on file    Attends religious service: Not on file    Active member of club or organization: Not on file    Attends meetings of clubs or organizations: Not  on file    Relationship status: Not on file  . Intimate partner violence:    Fear of current or ex partner: Not on file    Emotionally abused: Not on file    Physically abused: Not on file    Forced sexual activity: Not on file  Other Topics Concern  . Not on file  Social History Narrative   Lives w/ wife, 3 children plus adopted 3 children      Allergies as of 03/17/2019   No Known Allergies     Medication List       Accurate as of March 17, 2019  2:31 PM. Always use your most recent med list.        amLODipine 10 MG tablet Commonly known as:  NORVASC Take 1 tablet (10 mg total) by mouth daily.   atenolol 100 MG tablet Commonly known as:  TENORMIN Take 1 tablet (100 mg total) by mouth daily.   atorvastatin 20 MG tablet Commonly known as:  LIPITOR Take 1 tablet (20 mg total) by mouth at bedtime.    celecoxib 200 MG capsule Commonly known as:  CELEBREX Take 1 capsule (200 mg total) by mouth daily.   losartan 100 MG tablet Commonly known as:  COZAAR Take 1 tablet (100 mg total) by mouth daily.   multivitamin with minerals Tabs tablet Take 1 tablet by mouth daily.   omeprazole 20 MG capsule Commonly known as:  PRILOSEC Take 1 capsule (20 mg total) by mouth daily before breakfast.           Objective:   Physical Exam There were no vitals taken for this visit. This was a video conference, he seemed to be doing very well    Assessment     Assessment Prediabetes A1c 6.1 2015 HTN Hyperlipidemia MRI- brain  02-2018: Remote infarct, remote left basal ganglia and corona radiator perforated infarct GERD Elevated TSH 2014 H/o gout  DJD- hip Chronic back pain + PPD 2000, s/p  antibiotics   PLAN Prediabetes, last A1c 6.1, recheck today HTN: Continue with Amlodipine, atenolol, losartan.  Check a BMP High cholesterol: Based on last FLP started atorvastatin, he has no side effects, will check a AST, ALT, FLP. Will mail patient's instructions, see AVS AVS will be printed and mailed RTC for a CPX in 4 to 5 months.  I discussed the assessment and treatment plan with the patient. The patient was provided an opportunity to ask questions and all were answered. The patient agreed with the plan and demonstrated an understanding of the instructions.   The patient was advised to call back or seek an in-person evaluation if the symptoms worsen or if the condition fails to improve as anticipated.  I provided  20  minutes of non-face-to-face time during this encounter.   Kathlene November, MD

## 2019-03-17 NOTE — Patient Instructions (Signed)
It was nice to "see you" today  We are planning to check your blood on April 3, Friday, at 9 AM  Continue the same medications, call if you need refills  Please stay safe and follow all the CDC recommendations regards coronavirus  Next visit should be in 5 or 6 months for your physical exam.

## 2019-03-18 NOTE — Assessment & Plan Note (Signed)
Prediabetes, last A1c 6.1, recheck today HTN: Continue with Amlodipine, atenolol, losartan.  Check a BMP High cholesterol: Based on last FLP started atorvastatin, he has no side effects, will check a AST, ALT, FLP. Will mail patient's instructions, see AVS AVS will be printed and mailed RTC for a CPX in 4 to 5 months.

## 2019-03-21 ENCOUNTER — Other Ambulatory Visit: Payer: 59

## 2019-04-01 ENCOUNTER — Other Ambulatory Visit: Payer: Self-pay | Admitting: Internal Medicine

## 2019-04-24 ENCOUNTER — Other Ambulatory Visit: Payer: Self-pay | Admitting: Internal Medicine

## 2019-05-08 ENCOUNTER — Telehealth: Payer: Self-pay

## 2019-05-08 ENCOUNTER — Other Ambulatory Visit: Payer: Self-pay

## 2019-05-08 ENCOUNTER — Other Ambulatory Visit (INDEPENDENT_AMBULATORY_CARE_PROVIDER_SITE_OTHER): Payer: 59

## 2019-05-08 DIAGNOSIS — R739 Hyperglycemia, unspecified: Secondary | ICD-10-CM

## 2019-05-08 DIAGNOSIS — I10 Essential (primary) hypertension: Secondary | ICD-10-CM | POA: Diagnosis not present

## 2019-05-08 DIAGNOSIS — E785 Hyperlipidemia, unspecified: Secondary | ICD-10-CM

## 2019-05-08 DIAGNOSIS — I779 Disorder of arteries and arterioles, unspecified: Secondary | ICD-10-CM

## 2019-05-08 LAB — BASIC METABOLIC PANEL
BUN: 9 mg/dL (ref 6–23)
CO2: 28 mEq/L (ref 19–32)
Calcium: 9.6 mg/dL (ref 8.4–10.5)
Chloride: 101 mEq/L (ref 96–112)
Creatinine, Ser: 0.99 mg/dL (ref 0.40–1.50)
GFR: 95.56 mL/min (ref >60.00)
Glucose, Bld: 111 mg/dL — ABNORMAL HIGH (ref 70–99)
Potassium: 4.2 mEq/L (ref 3.5–5.1)
Sodium: 137 mEq/L (ref 135–145)

## 2019-05-08 LAB — AST: AST: 57 U/L — ABNORMAL HIGH (ref 0–37)

## 2019-05-08 LAB — HEMOGLOBIN A1C: Hgb A1c MFr Bld: 6.3 % (ref 4.6–6.5)

## 2019-05-08 LAB — ALT: ALT: 95 U/L — ABNORMAL HIGH (ref 0–53)

## 2019-05-08 NOTE — Telephone Encounter (Signed)
Order placed

## 2019-05-08 NOTE — Telephone Encounter (Signed)
Copied from [place] [id number]. Topic: General - Other >> May 07, 2019  4:23 PM Antonieta Iba C wrote: Reason for CRM: pt says that he had a [place] order placed about a year ago by PCP. Pt says that he called imaging to schedule and was advised that order had expired. Pt would like to have an updated order placed.  CB: [phone]

## 2019-05-08 NOTE — Telephone Encounter (Signed)
Okay, reorder a carotid ultrasound @ cardiology, DX carotid artery disease  (noted on MRI 02-2018)

## 2019-05-08 NOTE — Telephone Encounter (Signed)
Please advise 

## 2019-05-09 ENCOUNTER — Ambulatory Visit (HOSPITAL_BASED_OUTPATIENT_CLINIC_OR_DEPARTMENT_OTHER): Payer: 59

## 2019-05-13 NOTE — Addendum Note (Signed)
Addended byDamita Dunnings D on: 05/13/2019 02:25 PM   Modules accepted: Orders

## 2019-05-14 ENCOUNTER — Other Ambulatory Visit: Payer: Self-pay

## 2019-05-14 ENCOUNTER — Telehealth: Payer: Self-pay | Admitting: Internal Medicine

## 2019-05-14 ENCOUNTER — Ambulatory Visit (HOSPITAL_COMMUNITY)
Admission: RE | Admit: 2019-05-14 | Discharge: 2019-05-14 | Disposition: A | Discharge status: Home / Self Care | Payer: 59 | Source: Ambulatory Visit | Attending: Internal Medicine | Admitting: Internal Medicine

## 2019-05-14 DIAGNOSIS — I739 Peripheral vascular disease, unspecified: Secondary | ICD-10-CM | POA: Diagnosis present

## 2019-05-14 DIAGNOSIS — I6521 Occlusion and stenosis of right carotid artery: Secondary | ICD-10-CM

## 2019-05-14 DIAGNOSIS — R9389 Abnormal findings on diagnostic imaging of other specified body structures: Secondary | ICD-10-CM

## 2019-05-14 NOTE — Telephone Encounter (Signed)
Received phone call, critical report on R carotid ultrasound, full report pending. Called patient to let him know, no answer, will try again Plan: Urgent referral to [facility], carotid artery disease.  Please arrange

## 2019-05-14 NOTE — Telephone Encounter (Signed)
Urgent [facility] referral placed.

## 2019-05-14 NOTE — Telephone Encounter (Signed)
Right Carotid: Velocities in the right ICA are consistent with a 80-99%                stenosis. Near occlusion of the Bifurcation/ICA with                redonstituion of flow in the most distal region etiology unknown.  Above report discussed with the patient. Plan:  Start aspirin 81 mg Already referred to [facility] Patient like to be off work for few days until we have a better understanding of his problem, letter will be sent. ER if any neurological symptoms. He verbalized understanding

## 2019-05-14 NOTE — Progress Notes (Signed)
Bilateral carotid duplex completed. Results in Chart review CV Proc.  [place] Sigfredo Schreier,[facility] 05/14/2019, 12:16 PM

## 2019-05-15 ENCOUNTER — Telehealth: Payer: Self-pay

## 2019-05-15 NOTE — Telephone Encounter (Signed)
Spoke w/ Pt- informed that work note is ready for pick up at front desk. Pt verbalized understanding.

## 2019-05-15 NOTE — Telephone Encounter (Signed)
-----   Message from Colon Branch, MD sent at 05/14/2019  5:12 PM EDT ----- Regarding: letter Letter:Needs to be 10 days off work starting tomorrow.  Please let the patient know that letter will be ready

## 2019-05-15 NOTE — Telephone Encounter (Signed)
Work note printed, awaiting MD signature.

## 2019-05-19 ENCOUNTER — Other Ambulatory Visit: Payer: Self-pay

## 2019-05-19 ENCOUNTER — Telehealth (HOSPITAL_COMMUNITY): Payer: Self-pay

## 2019-05-19 DIAGNOSIS — I6529 Occlusion and stenosis of unspecified carotid artery: Secondary | ICD-10-CM

## 2019-05-19 NOTE — Telephone Encounter (Signed)
The above patient or their representative was contacted and gave the following answers to these questions:         Do you have any of the following symptoms?  NO  Fever                    Cough                   Shortness of breath  Do  you have any of the following other symptoms?    muscle pain         vomiting,        diarrhea        rash         weakness        red eye        abdominal pain         bruising          bruising or bleeding              joint pain           severe headache    Have you been in contact with someone who was or has been sick in the past 2 weeks?  NO  Yes                 Unsure                         Unable to assess   Does the person that you were in contact with have any of the following symptoms?   Cough         shortness of breath           muscle pain         vomiting,            diarrhea            rash            weakness           fever            red eye           abdominal pain           bruising  or  bleeding                joint pain                severe headache               Have you  or someone you have been in contact with traveled internationally in th last month?  NO       If yes, which countries?   Have you  or someone you have been in contact with traveled outside [place] in th last month?   NO      If yes, which state and city?   COMMENTS OR ACTION PLAN FOR THIS PATIENT:         

## 2019-05-20 ENCOUNTER — Other Ambulatory Visit: Payer: Self-pay

## 2019-05-20 ENCOUNTER — Encounter: Payer: Self-pay | Admitting: *Deleted

## 2019-05-20 ENCOUNTER — Ambulatory Visit: Payer: 59 | Admitting: Vascular Surgery

## 2019-05-20 ENCOUNTER — Encounter (HOSPITAL_COMMUNITY): Payer: Self-pay

## 2019-05-20 ENCOUNTER — Ambulatory Visit (HOSPITAL_COMMUNITY)
Admission: RE | Admit: 2019-05-20 | Discharge: 2019-05-20 | Disposition: A | Discharge status: Home / Self Care | Payer: 59 | Source: Ambulatory Visit | Attending: Vascular Surgery | Admitting: Vascular Surgery

## 2019-05-20 ENCOUNTER — Encounter: Payer: Self-pay | Admitting: Vascular Surgery

## 2019-05-20 ENCOUNTER — Other Ambulatory Visit: Payer: Self-pay | Admitting: *Deleted

## 2019-05-20 DIAGNOSIS — I6529 Occlusion and stenosis of unspecified carotid artery: Secondary | ICD-10-CM | POA: Insufficient documentation

## 2019-05-20 DIAGNOSIS — I6521 Occlusion and stenosis of right carotid artery: Secondary | ICD-10-CM | POA: Diagnosis not present

## 2019-05-20 NOTE — Progress Notes (Signed)
Patient name: Mike Shepard MRN: [id number] DOB: [birth year] Sex: male  REASON FOR CONSULT: Evaluate carotid stenosis  HPI: Mike Shepard is a 53 y.o. male, with history of chronic back pain, hypertension, hyperlipidemia, pre-diabetes and remote tobacco abuse that presents for evaluation of carotid stenosis.  Reportedly patient had an MRI last year for a headache that documented a concern for carotid disease.  Patient's primary care doctor attempted to order duplex for further surveillance but patient states he never got the study done until last month.  Carotid duplex on 05/14/2019 showed greater than 80% right ICA stenosis with very sluggish velocities and a thumping Doppler signal.  Left ICA had minimal changes and otherwise patent.  Patient denies any recent history of strokes TIAs weakness numbness or vision loss in the last 6 to 12 months.  He thinks someone told him he had a mini stroke years ago but is not really sure because he never saw a neurologist.  States he has had cervical fusion through a left approach.  Past Medical History:  Diagnosis Date   Chronic back pain    see surgeries    GERD (gastroesophageal reflux disease)    H/O: gout    Hyperlipidemia    Hypertension    PPD positive    Positive PPD, remotely ~2000, s/p Rx     Primary osteoarthritis of right hip    end stage    Past Surgical History:  Procedure Laterality Date   BACK SURGERY     2007-2013 (low back)   NECK SURGERY     2010    Family History  Problem Relation Age of Onset   Hypertension Mother        M,F,Sis, bro   Diabetes Father        F, M. 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)  Social Needs   Emergency planning/management officer strain: Not on file   Food insecurity:    Worry: Not on file    Inability: Not on file   Transportation needs:    Medical: Not on file    Non-medical: Not on file  Tobacco Use   Smoking status: Former Smoker    Types: Cigarettes    Last attempt to quit: 01/27/2010    Years since quitting: 9.3   Smokeless tobacco: Current User    Types: Chew   Tobacco comment: chews sometimes; quit smoking~ 12-2014, quit vaping     Substance and Sexual Activity   Alcohol use: Yes    Alcohol/week: 2.0 standard drinks    Types: 1 Glasses of wine, 1 Cans of beer per week    Comment: socially    Drug use: No    Comment: denies    Sexual activity: Yes    Birth control/protection: None  Lifestyle   Physical activity:  Days per week: Not on file    Minutes per session: Not on file   Stress: Not on file  Relationships   Social connections:    Talks on phone: Not on file    Gets together: Not on file    Attends religious service: Not on file    Active member of club or organization: Not on file    Attends meetings of clubs or organizations: Not on file    Relationship status: Not on file   Intimate partner violence:    Fear of current or ex partner: Not on file    Emotionally abused: Not on file    Physically abused: Not on file    Forced sexual activity: Not on file  Other Topics Concern   Not on file  Social History Narrative   Lives w/ wife, 3 children plus adopted 3 children    No Known Allergies  Current Outpatient Medications  Medication Sig Dispense Refill   amLODipine (NORVASC) 10 MG tablet Take 1 tablet (10 mg total) by mouth daily. 90 tablet 1   atenolol (TENORMIN) 100 MG tablet Take 1 tablet (100 mg total) by mouth daily. 90 tablet  1   atorvastatin (LIPITOR) 20 MG tablet Take 1 tablet (20 mg total) by mouth at bedtime. 30 tablet 3   buprenorphine (SUBUTEX) 8 MG SUBL SL tablet      losartan (COZAAR) 100 MG tablet Take 1 tablet (100 mg total) by mouth daily. 90 tablet 1   Multiple Vitamin (MULTIVITAMIN WITH MINERALS) TABS Take 1 tablet by mouth daily.     omeprazole (PRILOSEC) 20 MG capsule Take 1 capsule (20 mg total) by mouth daily before breakfast. 90 capsule 3   celecoxib (CELEBREX) 200 MG capsule Take 1 capsule (200 mg total) by mouth daily. (Patient not taking: Reported on 05/20/2019) 30 capsule 6   NARCAN 4 MG/0.1ML LIQD nasal spray kit USE AS DIRECTED     No current facility-administered medications for this visit.     REVIEW OF SYSTEMS:  '[X]'  denotes positive finding, '[ ]'  denotes negative finding Cardiac  Comments:  Chest pain or chest pressure:    Shortness of breath upon exertion:    Short of breath when lying flat:    Irregular heart rhythm:        Vascular    Pain in calf, thigh, or hip brought on by ambulation:    Pain in feet at night that wakes you up from your sleep:     Blood clot in your veins:    Leg swelling:         Pulmonary    Oxygen at home:    Productive cough:     Wheezing:         Neurologic    Sudden weakness in arms or legs:     Sudden numbness in arms or legs:     Sudden onset of difficulty speaking or slurred speech:    Temporary loss of vision in one eye:     Problems with dizziness:         Gastrointestinal    Blood in stool:     Vomited blood:         Genitourinary    Burning when urinating:     Blood in urine:        Psychiatric    Major depression:         Hematologic    Bleeding problems:    Problems with blood clotting too  easily:        Skin    Rashes or ulcers:        Constitutional    Fever or chills:      PHYSICAL EXAM: Vitals:   05/20/19 1538 05/20/19 1542  BP: (!) 152/85 (!) 152/87  Pulse: 66 66  Resp: 16   Temp: (!) 97.1 F (36.2  C)   TempSrc: Oral   SpO2: 97%   Weight: 242 lb (109.8 kg)   Height: 6' (1.829 m)     GENERAL: The patient is a well-nourished male, in no acute distress. The vital signs are documented above. CARDIAC: There is a regular rate and rhythm.  VASCULAR:  2+ radial pulse bilateral upper extremities 2+ carotid pulse bilaterally Left neck incision well healed from cervical operation PULMONARY: There is good air exchange bilaterally without wheezing or rales. ABDOMEN: Soft and non-tender with normal pitched bowel sounds.  MUSCULOSKELETAL: There are no major deformities or cyanosis. NEUROLOGIC: No focal weakness or paresthesias are detected.  CN II-XII grossly intact. SKIN: There are no ulcers or rashes noted. PSYCHIATRIC: The patient has a normal affect.  DATA:   Carotid duplex suggest a right ICA stenosis of 80+% with velocity of 26/4 with severe plaque noted.  There is reconstitution distally.  Assessment/Plan:  53 year old male with suspected high-grade right ICA stenosis given the recent carotid duplex findings.  He is asymptomatic from a carotid standpoint and would benefit from right carotid enterectomy given the degree of stenosis.  I did look at his carotid bifurcation in clinic with ultrasound myself and had some difficulty visualizing his bifurcation as well as any distal extent of the lesion.  As a result I have recommended CTA neck to further evaluate his anatomy and the lesion itself.  We will tentatively put him on the schedule for Monday, June 15 for right carotid endarterectomy.  He is to remain on aspirin in the interim and statin.  Discussed goal of surgery is reducing risk of stroke.  Discussed risks of surgery including bleeding, stroke, nerve injury, risk of restenosis in the future etc.   Marty Heck, MD [facility] Office: [phone] Pager: [phone]

## 2019-05-21 ENCOUNTER — Other Ambulatory Visit: Payer: Self-pay | Admitting: *Deleted

## 2019-05-21 DIAGNOSIS — I6521 Occlusion and stenosis of right carotid artery: Secondary | ICD-10-CM

## 2019-05-26 ENCOUNTER — Ambulatory Visit (HOSPITAL_COMMUNITY)
Admission: RE | Admit: 2019-05-26 | Discharge: 2019-05-26 | Disposition: A | Discharge status: Home / Self Care | Payer: 59 | Source: Ambulatory Visit | Attending: Vascular Surgery | Admitting: Vascular Surgery

## 2019-05-26 ENCOUNTER — Other Ambulatory Visit: Payer: Self-pay

## 2019-05-26 ENCOUNTER — Encounter (HOSPITAL_COMMUNITY): Payer: Self-pay

## 2019-05-26 DIAGNOSIS — I6521 Occlusion and stenosis of right carotid artery: Secondary | ICD-10-CM | POA: Diagnosis present

## 2019-05-26 DIAGNOSIS — Z0279 Encounter for issue of other medical certificate: Secondary | ICD-10-CM

## 2019-05-26 MED ORDER — IOHEXOL 350 MG/ML SOLN
100.0000 mL | Freq: Once | INTRAVENOUS | Status: AC | PRN
  Administered 2019-05-26: 100 mL via INTRAVENOUS

## 2019-05-26 MED ORDER — SODIUM CHLORIDE (PF) 0.9 % IJ SOLN
INTRAMUSCULAR | Status: AC
  Filled 2019-05-26: qty 50

## 2019-05-29 ENCOUNTER — Other Ambulatory Visit: Payer: Self-pay | Admitting: *Deleted

## 2019-05-29 ENCOUNTER — Telehealth: Payer: Self-pay

## 2019-05-29 ENCOUNTER — Other Ambulatory Visit: Payer: Self-pay | Admitting: Internal Medicine

## 2019-05-29 NOTE — Telephone Encounter (Signed)
Pt called and said that he has still not heard anything from preadmit testing regarding his surgery or the Covid test.   Left message on both [facility] and [place] numbers with [facility] advising that pt had not heard back regarding any testing that needed to be done including his Covid test. Asked them to call pt or [place] to let us know what was needed so he will be ready for his surgery on Monday.   Left message as well advising pt that I had called both numbers and waiting to hear back.   York Cerise, CMA

## 2019-05-29 NOTE — Progress Notes (Signed)
Left voice mail for patient to expect call from hospital pre-op testing for pre-op appointment and nasal swab screening. Instructed NPO past MN night prior and to be at hospital at 7:30 am on 06/02/2019 for surgery. [facility] Fl[facility] called at pre-admit patient added on for surgery.

## 2019-05-30 ENCOUNTER — Other Ambulatory Visit (HOSPITAL_COMMUNITY)
Admission: RE | Admit: 2019-05-30 | Discharge: 2019-05-30 | Disposition: A | Discharge status: Home / Self Care | Payer: 59 | Source: Ambulatory Visit | Attending: Vascular Surgery | Admitting: Vascular Surgery

## 2019-05-30 ENCOUNTER — Other Ambulatory Visit: Payer: Self-pay

## 2019-05-30 ENCOUNTER — Encounter (HOSPITAL_COMMUNITY): Payer: Self-pay | Admitting: *Deleted

## 2019-05-30 ENCOUNTER — Other Ambulatory Visit: Payer: 59

## 2019-05-30 ENCOUNTER — Other Ambulatory Visit (INDEPENDENT_AMBULATORY_CARE_PROVIDER_SITE_OTHER): Payer: 59

## 2019-05-30 DIAGNOSIS — Z1159 Encounter for screening for other viral diseases: Secondary | ICD-10-CM | POA: Insufficient documentation

## 2019-05-30 DIAGNOSIS — E785 Hyperlipidemia, unspecified: Secondary | ICD-10-CM

## 2019-05-30 DIAGNOSIS — Z01812 Encounter for preprocedural laboratory examination: Secondary | ICD-10-CM | POA: Insufficient documentation

## 2019-05-30 LAB — LIPID PANEL
Cholesterol: 211 mg/dL — ABNORMAL HIGH (ref 0–200)
HDL: 35.4 mg/dL — ABNORMAL LOW (ref >39.00)
NonHDL: 175.92
Total CHOL/HDL Ratio: 6
Triglycerides: 225 mg/dL — ABNORMAL HIGH (ref 0.0–149.0)
VLDL: 45 mg/dL — ABNORMAL HIGH (ref 0.0–40.0)

## 2019-05-30 LAB — LDL CHOLESTEROL, DIRECT: Direct LDL: 140 mg/dL

## 2019-05-30 LAB — ALT: ALT: 72 U/L — ABNORMAL HIGH (ref 0–53)

## 2019-05-30 LAB — AST: AST: 36 U/L (ref 0–37)

## 2019-05-30 NOTE — Progress Notes (Signed)
Pt denies SOB, chest pain, and being under the care of a cardiologist. Pt denies having an echo and cardiac cath. Pt denies having an EKG and chest x ray within the last year. Pt stated that prescriber of Subutex recommended that he stop the medication. pre-op; pt stated that he discussed the need to discontinue Subutex with surgeon and he stated that " I don't need to stop it." Pt made aware to stop taking  vitamins, fish oil and herbal medications. Do not take any NSAIDs ie: Ibuprofen, Advil, Naproxen (Aleve), Motrin, BC and Goody Powder.  Pt denies that he and members of his family tested positive for COVID-19 (pt tested today and reminded to quarantine).    Pt denies that he and members of his family experienced the following symptoms:  Cough yes/no: No Fever (>100.67F)  yes/no: No Runny nose yes/no: No Sore throat yes/no: No Difficulty breathing/shortness of breath  yes/no: No  Have you or a family member traveled in the last 14 days and where? yes/no: No  Pt reminded that hospital visitation restrictions are in effect and the importance of the restrictions.   Pt verbalized understanding of all pre-op instructions.

## 2019-05-31 LAB — NOVEL CORONAVIRUS, NAA (HOSP ORDER, SEND-OUT TO REF LAB; TAT 18-24 HRS): SARS-CoV-2, NAA: NOT DETECTED

## 2019-06-02 ENCOUNTER — Encounter (HOSPITAL_COMMUNITY): Payer: Self-pay

## 2019-06-02 ENCOUNTER — Inpatient Hospital Stay (HOSPITAL_COMMUNITY): Payer: 59 | Admitting: Certified Registered Nurse Anesthetist

## 2019-06-02 ENCOUNTER — Other Ambulatory Visit: Payer: Self-pay

## 2019-06-02 ENCOUNTER — Telehealth: Payer: Self-pay | Admitting: Vascular Surgery

## 2019-06-02 ENCOUNTER — Encounter (HOSPITAL_COMMUNITY)
Admission: RE | Disposition: A | Discharge status: Home / Self Care | Payer: Self-pay | Source: Home / Self Care | Attending: Vascular Surgery

## 2019-06-02 ENCOUNTER — Inpatient Hospital Stay (HOSPITAL_COMMUNITY)
Admission: RE | Admit: 2019-06-02 | Discharge: 2019-06-04 | DRG: 039 | Disposition: A | Discharge status: Home / Self Care | Payer: 59 | Attending: Vascular Surgery | Admitting: Vascular Surgery

## 2019-06-02 ENCOUNTER — Other Ambulatory Visit (HOSPITAL_COMMUNITY): Payer: Self-pay | Admitting: *Deleted

## 2019-06-02 DIAGNOSIS — I10 Essential (primary) hypertension: Secondary | ICD-10-CM | POA: Diagnosis present

## 2019-06-02 DIAGNOSIS — K219 Gastro-esophageal reflux disease without esophagitis: Secondary | ICD-10-CM | POA: Diagnosis present

## 2019-06-02 DIAGNOSIS — Z833 Family history of diabetes mellitus: Secondary | ICD-10-CM | POA: Diagnosis not present

## 2019-06-02 DIAGNOSIS — G8929 Other chronic pain: Secondary | ICD-10-CM | POA: Diagnosis present

## 2019-06-02 DIAGNOSIS — I6521 Occlusion and stenosis of right carotid artery: Secondary | ICD-10-CM

## 2019-06-02 DIAGNOSIS — M549 Dorsalgia, unspecified: Secondary | ICD-10-CM | POA: Diagnosis present

## 2019-06-02 DIAGNOSIS — Z8042 Family history of malignant neoplasm of prostate: Secondary | ICD-10-CM | POA: Diagnosis not present

## 2019-06-02 DIAGNOSIS — Z79899 Other long term (current) drug therapy: Secondary | ICD-10-CM

## 2019-06-02 DIAGNOSIS — Z8249 Family history of ischemic heart disease and other diseases of the circulatory system: Secondary | ICD-10-CM | POA: Diagnosis not present

## 2019-06-02 DIAGNOSIS — Z87891 Personal history of nicotine dependence: Secondary | ICD-10-CM | POA: Diagnosis not present

## 2019-06-02 DIAGNOSIS — R7303 Prediabetes: Secondary | ICD-10-CM | POA: Diagnosis present

## 2019-06-02 DIAGNOSIS — E785 Hyperlipidemia, unspecified: Secondary | ICD-10-CM | POA: Diagnosis present

## 2019-06-02 DIAGNOSIS — M1611 Unilateral primary osteoarthritis, right hip: Secondary | ICD-10-CM | POA: Diagnosis present

## 2019-06-02 HISTORY — DX: Presence of dental prosthetic device (complete) (partial): Z97.2

## 2019-06-02 HISTORY — DX: Cerebral infarction, unspecified: I63.9

## 2019-06-02 HISTORY — DX: Prediabetes: R73.03

## 2019-06-02 HISTORY — PX: ENDARTERECTOMY: SHX5162

## 2019-06-02 HISTORY — DX: Occlusion and stenosis of unspecified carotid artery: I65.29

## 2019-06-02 HISTORY — PX: OPERATIVE ULTRASOUND: SHX5996

## 2019-06-02 HISTORY — PX: INTRAOPERATIVE ARTERIOGRAM: SHX5157

## 2019-06-02 LAB — URINALYSIS, ROUTINE W REFLEX MICROSCOPIC
Bilirubin Urine: NEGATIVE
Glucose, UA: NEGATIVE mg/dL
Hgb urine dipstick: NEGATIVE
Ketones, ur: NEGATIVE mg/dL
Leukocytes,Ua: NEGATIVE
Nitrite: NEGATIVE
Protein, ur: NEGATIVE mg/dL
Specific Gravity, Urine: 1.025 (ref 1.005–1.030)
pH: 5 (ref 5.0–8.0)

## 2019-06-02 LAB — ABO/RH: ABO/RH(D): O POS

## 2019-06-02 LAB — COMPREHENSIVE METABOLIC PANEL
ALT: 78 U/L — ABNORMAL HIGH (ref 0–44)
AST: 47 U/L — ABNORMAL HIGH (ref 15–41)
Albumin: 3.7 g/dL (ref 3.5–5.0)
Alkaline Phosphatase: 112 U/L (ref 38–126)
Anion gap: 9 (ref 5–15)
BUN: 12 mg/dL (ref 6–20)
CO2: 25 mmol/L (ref 22–32)
Calcium: 9.3 mg/dL (ref 8.9–10.3)
Chloride: 103 mmol/L (ref 98–111)
Creatinine, Ser: 1.15 mg/dL (ref 0.61–1.24)
GFR calc Af Amer: >60 mL/min (ref >60)
GFR calc non Af Amer: >60 mL/min (ref >60)
Glucose, Bld: 124 mg/dL — ABNORMAL HIGH (ref 70–99)
Potassium: 4.1 mmol/L (ref 3.5–5.1)
Sodium: 137 mmol/L (ref 135–145)
Total Bilirubin: 1.1 mg/dL (ref 0.3–1.2)
Total Protein: 7.3 g/dL (ref 6.5–8.1)

## 2019-06-02 LAB — TYPE AND SCREEN
ABO/RH(D): O POS
Antibody Screen: NEGATIVE

## 2019-06-02 LAB — GLUCOSE, CAPILLARY: Glucose-Capillary: 113 mg/dL — ABNORMAL HIGH (ref 70–99)

## 2019-06-02 LAB — CBC
HCT: 43.2 % (ref 39.0–52.0)
Hemoglobin: 14.4 g/dL (ref 13.0–17.0)
MCH: 28.9 pg (ref 26.0–34.0)
MCHC: 33.3 g/dL (ref 30.0–36.0)
MCV: 86.7 fL (ref 80.0–100.0)
Platelets: 226 10*3/uL (ref 150–400)
RBC: 4.98 MIL/uL (ref 4.22–5.81)
RDW: 12.4 % (ref 11.5–15.5)
WBC: 6.1 10*3/uL (ref 4.0–10.5)
nRBC: 0 % (ref 0.0–0.2)

## 2019-06-02 LAB — PROTIME-INR
INR: 1 (ref 0.8–1.2)
Prothrombin Time: 13.4 seconds (ref 11.4–15.2)

## 2019-06-02 LAB — POCT ACTIVATED CLOTTING TIME
Activated Clotting Time: 329 seconds
Activated Clotting Time: 274 seconds

## 2019-06-02 LAB — APTT: aPTT: 35 seconds (ref 24–36)

## 2019-06-02 SURGERY — ENDARTERECTOMY, CAROTID
Anesthesia: General | Site: Neck | Laterality: Right

## 2019-06-02 MED ORDER — SODIUM CHLORIDE 0.9 % IV SOLN
0.0125 ug/kg/min | INTRAVENOUS | Status: DC
  Filled 2019-06-02: qty 2000

## 2019-06-02 MED ORDER — SUCCINYLCHOLINE CHLORIDE 20 MG/ML IJ SOLN
INTRAMUSCULAR | Status: DC | PRN
  Administered 2019-06-02: 140 mg via INTRAVENOUS

## 2019-06-02 MED ORDER — EPHEDRINE 5 MG/ML INJ
INTRAVENOUS | Status: AC
  Filled 2019-06-02: qty 10

## 2019-06-02 MED ORDER — ONDANSETRON HCL 4 MG/2ML IJ SOLN
4.0000 mg | Freq: Four times a day (QID) | INTRAMUSCULAR | Status: DC | PRN
  Administered 2019-06-02: 21:00:00 4 mg via INTRAVENOUS
  Filled 2019-06-02: qty 2

## 2019-06-02 MED ORDER — SODIUM CHLORIDE 0.9 % IV SOLN
INTRAVENOUS | Status: DC | PRN
  Administered 2019-06-02: 30 ug/min via INTRAVENOUS

## 2019-06-02 MED ORDER — HEPARIN SODIUM (PORCINE) 1000 UNIT/ML IJ SOLN
INTRAMUSCULAR | Status: AC
  Filled 2019-06-02: qty 2

## 2019-06-02 MED ORDER — 0.9 % SODIUM CHLORIDE (POUR BTL) OPTIME
TOPICAL | Status: DC | PRN
  Administered 2019-06-02: 2000 mL

## 2019-06-02 MED ORDER — LIDOCAINE HCL (PF) 1 % IJ SOLN
INTRAMUSCULAR | Status: DC | PRN
  Administered 2019-06-02: 20 mL

## 2019-06-02 MED ORDER — LABETALOL HCL 5 MG/ML IV SOLN: 10.0000 mg | INTRAVENOUS | Status: DC | PRN

## 2019-06-02 MED ORDER — SODIUM CHLORIDE 0.9 % IV SOLN
0.0125 ug/kg/min | INTRAVENOUS | Status: AC
  Administered 2019-06-02: .1 ug/kg/min via INTRAVENOUS
  Filled 2019-06-02: qty 2000

## 2019-06-02 MED ORDER — ASPIRIN EC 81 MG PO TBEC
81.0000 mg | DELAYED_RELEASE_TABLET | Freq: Every day | ORAL | Status: DC
  Administered 2019-06-03 – 2019-06-04 (×2): 81 mg via ORAL
  Filled 2019-06-02 (×2): qty 1

## 2019-06-02 MED ORDER — LIDOCAINE 2% (20 MG/ML) 5 ML SYRINGE
INTRAMUSCULAR | Status: AC
  Filled 2019-06-02: qty 5

## 2019-06-02 MED ORDER — BUPRENORPHINE HCL-NALOXONE HCL 8-2 MG SL SUBL
1.0000 | SUBLINGUAL_TABLET | Freq: Every day | SUBLINGUAL | Status: DC
  Administered 2019-06-03: 10:00:00 1 via SUBLINGUAL
  Filled 2019-06-02: qty 1

## 2019-06-02 MED ORDER — ATENOLOL 25 MG PO TABS
100.0000 mg | ORAL_TABLET | Freq: Every day | ORAL | Status: DC
  Filled 2019-06-02 (×2): qty 4

## 2019-06-02 MED ORDER — MIDAZOLAM HCL 2 MG/2ML IJ SOLN
INTRAMUSCULAR | Status: AC
  Filled 2019-06-02: qty 2

## 2019-06-02 MED ORDER — DOPAMINE-DEXTROSE 3.2-5 MG/ML-% IV SOLN
5.0000 ug/kg/min | INTRAVENOUS | Status: DC
  Administered 2019-06-02: 5 ug/kg/min via INTRAVENOUS
  Filled 2019-06-02 (×2): qty 250

## 2019-06-02 MED ORDER — POLYETHYLENE GLYCOL 3350 17 G PO PACK: 17.0000 g | PACK | Freq: Every day | ORAL | Status: DC | PRN

## 2019-06-02 MED ORDER — PHENYLEPHRINE HCL-NACL 10-0.9 MG/250ML-% IV SOLN
0.0000 ug/min | INTRAVENOUS | Status: DC
  Administered 2019-06-02: 10 ug/min via INTRAVENOUS
  Administered 2019-06-02: 80 ug/min via INTRAVENOUS
  Administered 2019-06-02: 40 ug/min via INTRAVENOUS
  Administered 2019-06-02: 30 ug/min via INTRAVENOUS
  Administered 2019-06-02: 20 ug/min via INTRAVENOUS
  Administered 2019-06-02: 70 ug/min via INTRAVENOUS
  Administered 2019-06-02: 50 ug/min via INTRAVENOUS
  Administered 2019-06-02: 60 ug/min via INTRAVENOUS
  Administered 2019-06-02: 16:00:00 5 ug/min via INTRAVENOUS
  Filled 2019-06-02: qty 250

## 2019-06-02 MED ORDER — LIDOCAINE HCL 1 % IJ SOLN
INTRAMUSCULAR | Status: AC
  Filled 2019-06-02: qty 20

## 2019-06-02 MED ORDER — MORPHINE SULFATE (PF) 2 MG/ML IV SOLN
2.0000 mg | INTRAVENOUS | Status: DC | PRN
  Administered 2019-06-02: 16:00:00 2 mg via INTRAVENOUS
  Filled 2019-06-02: qty 1

## 2019-06-02 MED ORDER — ACETAMINOPHEN 325 MG PO TABS
325.0000 mg | ORAL_TABLET | ORAL | Status: DC | PRN
  Administered 2019-06-03: 650 mg via ORAL
  Filled 2019-06-02: qty 2

## 2019-06-02 MED ORDER — OXYCODONE-ACETAMINOPHEN 5-325 MG PO TABS
1.0000 | ORAL_TABLET | ORAL | Status: DC | PRN
  Administered 2019-06-02: 15:00:00 2 via ORAL

## 2019-06-02 MED ORDER — SODIUM CHLORIDE 0.9 % IV SOLN
INTRAVENOUS | Status: DC | PRN
  Administered 2019-06-02: 500 mL

## 2019-06-02 MED ORDER — DOCUSATE SODIUM 100 MG PO CAPS
100.0000 mg | ORAL_CAPSULE | Freq: Every day | ORAL | Status: DC
  Administered 2019-06-03 – 2019-06-04 (×2): 100 mg via ORAL
  Filled 2019-06-02 (×2): qty 1

## 2019-06-02 MED ORDER — PHENYLEPHRINE 40 MCG/ML (10ML) SYRINGE FOR IV PUSH (FOR BLOOD PRESSURE SUPPORT)
PREFILLED_SYRINGE | INTRAVENOUS | Status: AC
  Filled 2019-06-02: qty 20

## 2019-06-02 MED ORDER — PHENOL 1.4 % MT LIQD: 1.0000 | OROMUCOSAL | Status: DC | PRN

## 2019-06-02 MED ORDER — ACETAMINOPHEN 325 MG RE SUPP: 325.0000 mg | RECTAL | Status: DC | PRN

## 2019-06-02 MED ORDER — BISACODYL 10 MG RE SUPP: 10.0000 mg | Freq: Every day | RECTAL | Status: DC | PRN

## 2019-06-02 MED ORDER — PROTAMINE SULFATE 10 MG/ML IV SOLN
INTRAVENOUS | Status: DC | PRN
  Administered 2019-06-02: 40 mg via INTRAVENOUS

## 2019-06-02 MED ORDER — EPHEDRINE SULFATE-NACL 50-0.9 MG/10ML-% IV SOSY
PREFILLED_SYRINGE | INTRAVENOUS | Status: DC | PRN
  Administered 2019-06-02 (×2): 10 mg via INTRAVENOUS

## 2019-06-02 MED ORDER — ONDANSETRON HCL 4 MG/2ML IJ SOLN
INTRAMUSCULAR | Status: DC | PRN
  Administered 2019-06-02: 4 mg via INTRAVENOUS

## 2019-06-02 MED ORDER — CHLORHEXIDINE GLUCONATE 4 % EX LIQD: 60.0000 mL | Freq: Once | CUTANEOUS | Status: DC

## 2019-06-02 MED ORDER — SUCCINYLCHOLINE CHLORIDE 200 MG/10ML IV SOSY
PREFILLED_SYRINGE | INTRAVENOUS | Status: AC
  Filled 2019-06-02: qty 10

## 2019-06-02 MED ORDER — FENTANYL CITRATE (PF) 100 MCG/2ML IJ SOLN
25.0000 ug | INTRAMUSCULAR | Status: DC | PRN
  Administered 2019-06-02 (×3): 50 ug via INTRAVENOUS

## 2019-06-02 MED ORDER — ROCURONIUM BROMIDE 10 MG/ML (PF) SYRINGE
PREFILLED_SYRINGE | INTRAVENOUS | Status: DC | PRN
  Administered 2019-06-02: 70 mg via INTRAVENOUS
  Administered 2019-06-02: 20 mg via INTRAVENOUS

## 2019-06-02 MED ORDER — ATORVASTATIN CALCIUM 10 MG PO TABS
20.0000 mg | ORAL_TABLET | Freq: Every day | ORAL | Status: DC
  Administered 2019-06-02 – 2019-06-03 (×2): 20 mg via ORAL
  Filled 2019-06-02 (×2): qty 2

## 2019-06-02 MED ORDER — HYDRALAZINE HCL 20 MG/ML IJ SOLN: 5.0000 mg | INTRAMUSCULAR | Status: DC | PRN

## 2019-06-02 MED ORDER — MIDAZOLAM HCL 5 MG/5ML IJ SOLN
INTRAMUSCULAR | Status: DC | PRN
  Administered 2019-06-02: 1 mg via INTRAVENOUS

## 2019-06-02 MED ORDER — METOPROLOL TARTRATE 5 MG/5ML IV SOLN: 2.0000 mg | INTRAVENOUS | Status: DC | PRN

## 2019-06-02 MED ORDER — DEXAMETHASONE SODIUM PHOSPHATE 10 MG/ML IJ SOLN
INTRAMUSCULAR | Status: DC | PRN
  Administered 2019-06-02: 10 mg via INTRAVENOUS

## 2019-06-02 MED ORDER — FENTANYL CITRATE (PF) 100 MCG/2ML IJ SOLN
INTRAMUSCULAR | Status: AC
  Filled 2019-06-02: qty 2

## 2019-06-02 MED ORDER — OXYCODONE-ACETAMINOPHEN 5-325 MG PO TABS
ORAL_TABLET | ORAL | Status: AC
  Filled 2019-06-02: qty 2

## 2019-06-02 MED ORDER — SUGAMMADEX SODIUM 200 MG/2ML IV SOLN
INTRAVENOUS | Status: DC | PRN
  Administered 2019-06-02: 200 mg via INTRAVENOUS

## 2019-06-02 MED ORDER — PANTOPRAZOLE SODIUM 40 MG PO TBEC
40.0000 mg | DELAYED_RELEASE_TABLET | Freq: Every day | ORAL | Status: DC
  Administered 2019-06-03 – 2019-06-04 (×2): 40 mg via ORAL
  Filled 2019-06-02 (×2): qty 1

## 2019-06-02 MED ORDER — MORPHINE SULFATE (PF) 2 MG/ML IV SOLN
INTRAVENOUS | Status: AC
  Filled 2019-06-02: qty 1

## 2019-06-02 MED ORDER — LIDOCAINE 2% (20 MG/ML) 5 ML SYRINGE
INTRAMUSCULAR | Status: DC | PRN
  Administered 2019-06-02: 100 mg via INTRAVENOUS

## 2019-06-02 MED ORDER — ADULT MULTIVITAMIN W/MINERALS CH
1.0000 | ORAL_TABLET | Freq: Every day | ORAL | Status: DC
  Administered 2019-06-03 – 2019-06-04 (×2): 1 via ORAL
  Filled 2019-06-02 (×2): qty 1

## 2019-06-02 MED ORDER — LACTATED RINGERS IV SOLN
INTRAVENOUS | Status: DC
  Administered 2019-06-02: 08:00:00 via INTRAVENOUS

## 2019-06-02 MED ORDER — FENTANYL CITRATE (PF) 250 MCG/5ML IJ SOLN
INTRAMUSCULAR | Status: DC | PRN
  Administered 2019-06-02: 100 ug via INTRAVENOUS

## 2019-06-02 MED ORDER — PROTAMINE SULFATE 10 MG/ML IV SOLN
INTRAVENOUS | Status: AC
  Filled 2019-06-02: qty 5

## 2019-06-02 MED ORDER — CEFAZOLIN SODIUM-DEXTROSE 2-4 GM/100ML-% IV SOLN
2.0000 g | INTRAVENOUS | Status: AC
  Administered 2019-06-02: 2 g via INTRAVENOUS

## 2019-06-02 MED ORDER — DEXAMETHASONE SODIUM PHOSPHATE 10 MG/ML IJ SOLN
INTRAMUSCULAR | Status: AC
  Filled 2019-06-02: qty 2

## 2019-06-02 MED ORDER — LACTATED RINGERS IV SOLN
INTRAVENOUS | Status: DC | PRN
  Administered 2019-06-02: 11:00:00 via INTRAVENOUS

## 2019-06-02 MED ORDER — ALUM & MAG HYDROXIDE-SIMETH 200-200-20 MG/5ML PO SUSP: 15.0000 mL | ORAL | Status: DC | PRN

## 2019-06-02 MED ORDER — FENTANYL CITRATE (PF) 250 MCG/5ML IJ SOLN
INTRAMUSCULAR | Status: AC
  Filled 2019-06-02: qty 5

## 2019-06-02 MED ORDER — GLYCOPYRROLATE PF 0.2 MG/ML IJ SOSY
PREFILLED_SYRINGE | INTRAMUSCULAR | Status: DC | PRN
  Administered 2019-06-02 (×2): .2 mg via INTRAVENOUS

## 2019-06-02 MED ORDER — HEMOSTATIC AGENTS (NO CHARGE) OPTIME
TOPICAL | Status: DC | PRN
  Administered 2019-06-02: 1 via TOPICAL

## 2019-06-02 MED ORDER — ALBUMIN HUMAN 5 % IV SOLN
INTRAVENOUS | Status: AC
  Filled 2019-06-02: qty 250

## 2019-06-02 MED ORDER — PROMETHAZINE HCL 25 MG/ML IJ SOLN
12.5000 mg | Freq: Four times a day (QID) | INTRAMUSCULAR | Status: DC | PRN
  Administered 2019-06-02: 12.5 mg via INTRAVENOUS
  Filled 2019-06-02: qty 1

## 2019-06-02 MED ORDER — GUAIFENESIN-DM 100-10 MG/5ML PO SYRP: 15.0000 mL | ORAL_SOLUTION | ORAL | Status: DC | PRN

## 2019-06-02 MED ORDER — ONDANSETRON HCL 4 MG/2ML IJ SOLN
INTRAMUSCULAR | Status: AC
  Filled 2019-06-02: qty 4

## 2019-06-02 MED ORDER — SODIUM CHLORIDE 0.9 % IV SOLN: INTRAVENOUS | Status: DC

## 2019-06-02 MED ORDER — SODIUM CHLORIDE 0.9 % IV SOLN
INTRAVENOUS | Status: DC
  Administered 2019-06-02 – 2019-06-03 (×2): via INTRAVENOUS

## 2019-06-02 MED ORDER — HEPARIN SODIUM (PORCINE) 1000 UNIT/ML IJ SOLN
INTRAMUSCULAR | Status: DC | PRN
  Administered 2019-06-02: 11000 [IU] via INTRAVENOUS
  Administered 2019-06-02: 3000 [IU] via INTRAVENOUS

## 2019-06-02 MED ORDER — POTASSIUM CHLORIDE CRYS ER 20 MEQ PO TBCR: 20.0000 meq | EXTENDED_RELEASE_TABLET | Freq: Every day | ORAL | Status: DC | PRN

## 2019-06-02 MED ORDER — CEFAZOLIN SODIUM-DEXTROSE 2-4 GM/100ML-% IV SOLN
2.0000 g | Freq: Three times a day (TID) | INTRAVENOUS | Status: AC
  Administered 2019-06-02 – 2019-06-03 (×2): 2 g via INTRAVENOUS
  Filled 2019-06-02 (×2): qty 100

## 2019-06-02 MED ORDER — PROPOFOL 10 MG/ML IV BOLUS
INTRAVENOUS | Status: DC | PRN
  Administered 2019-06-02: 20 mg via INTRAVENOUS
  Administered 2019-06-02: 180 mg via INTRAVENOUS

## 2019-06-02 MED ORDER — SODIUM CHLORIDE 0.9 % IV SOLN
500.0000 mL | Freq: Once | INTRAVENOUS | Status: AC | PRN
  Administered 2019-06-02: 500 mL via INTRAVENOUS

## 2019-06-02 MED ORDER — SODIUM CHLORIDE 0.9 % IV SOLN
INTRAVENOUS | Status: AC
  Filled 2019-06-02: qty 1.2

## 2019-06-02 MED ORDER — ALBUMIN HUMAN 5 % IV SOLN
12.5000 g | Freq: Once | INTRAVENOUS | Status: AC
  Administered 2019-06-02: 19:00:00 12.5 g via INTRAVENOUS

## 2019-06-02 MED ORDER — MAGNESIUM SULFATE 2 GM/50ML IV SOLN: 2.0000 g | Freq: Every day | INTRAVENOUS | Status: DC | PRN

## 2019-06-02 MED ORDER — AMLODIPINE BESYLATE 10 MG PO TABS
10.0000 mg | ORAL_TABLET | Freq: Every day | ORAL | Status: DC
  Administered 2019-06-04: 08:00:00 10 mg via ORAL
  Filled 2019-06-02 (×2): qty 1

## 2019-06-02 MED ORDER — ROCURONIUM BROMIDE 10 MG/ML (PF) SYRINGE
PREFILLED_SYRINGE | INTRAVENOUS | Status: AC
  Filled 2019-06-02: qty 10

## 2019-06-02 SURGICAL SUPPLY — 67 items
ADH SKN CLS APL DERMABOND .7 (GAUZE/BANDAGES/DRESSINGS) ×3
ADPR TBG 2 MALE LL ART (MISCELLANEOUS)
CANISTER SUCT 3000ML PPV (MISCELLANEOUS) ×4 IMPLANT
CATH ROBINSON RED A/P 18FR (CATHETERS) ×4 IMPLANT
CLIP VESOCCLUDE MED 24/CT (CLIP) ×4 IMPLANT
CLIP VESOCCLUDE SM WIDE 24/CT (CLIP) ×4 IMPLANT
COVER DOME SNAP 22 D (MISCELLANEOUS) ×1 IMPLANT
COVER PROBE W GEL 5X96 (DRAPES) ×2 IMPLANT
COVER TRANSDUCER ULTRASND GEL (DRAPE) ×4 IMPLANT
COVER WAND RF STERILE (DRAPES) ×4 IMPLANT
CRADLE DONUT ADULT HEAD (MISCELLANEOUS) ×4 IMPLANT
DERMABOND ADVANCED (GAUZE/BANDAGES/DRESSINGS) ×1
DERMABOND ADVANCED .7 DNX12 (GAUZE/BANDAGES/DRESSINGS) ×3 IMPLANT
DRAIN CHANNEL 15F RND FF W/TCR (WOUND CARE) IMPLANT
ELECT REM PT RETURN 9FT ADLT (ELECTROSURGICAL) ×4
ELECTRODE REM PT RTRN 9FT ADLT (ELECTROSURGICAL) ×3 IMPLANT
EVACUATOR SILICONE 100CC (DRAIN) IMPLANT
GLOVE BIO SURGEON STRL SZ 6.5 (GLOVE) ×6 IMPLANT
GLOVE BIO SURGEON STRL SZ7.5 (GLOVE) ×5 IMPLANT
GLOVE BIOGEL PI IND STRL 7.0 (GLOVE) IMPLANT
GLOVE BIOGEL PI IND STRL 8 (GLOVE) ×3 IMPLANT
GLOVE BIOGEL PI INDICATOR 7.0 (GLOVE) ×1
GLOVE BIOGEL PI INDICATOR 8 (GLOVE) ×2
GLOVE SS BIOGEL STRL SZ 6.5 (GLOVE) IMPLANT
GLOVE SUPERSENSE BIOGEL SZ 6.5 (GLOVE) ×1
GOWN STRL REUS W/ TWL LRG LVL3 (GOWN DISPOSABLE) ×6 IMPLANT
GOWN STRL REUS W/ TWL XL LVL3 (GOWN DISPOSABLE) ×6 IMPLANT
GOWN STRL REUS W/TWL LRG LVL3 (GOWN DISPOSABLE) ×8
GOWN STRL REUS W/TWL XL LVL3 (GOWN DISPOSABLE) ×4
HEMOSTAT SNOW SURGICEL 2X4 (HEMOSTASIS) ×1 IMPLANT
IV ADAPTER SYR DOUBLE MALE LL (MISCELLANEOUS) IMPLANT
KIT BASIN OR (CUSTOM PROCEDURE TRAY) ×4 IMPLANT
KIT SHUNT ARGYLE CAROTID ART 6 (VASCULAR PRODUCTS) ×1 IMPLANT
KIT TURNOVER KIT B (KITS) ×4 IMPLANT
LOOP VESSEL MINI RED (MISCELLANEOUS) IMPLANT
NDL HYPO 25GX1X1/2 BEV (NEEDLE) IMPLANT
NDL SPNL 20GX3.5 QUINCKE YW (NEEDLE) IMPLANT
NEEDLE HYPO 25GX1X1/2 BEV (NEEDLE) ×4 IMPLANT
NEEDLE SPNL 20GX3.5 QUINCKE YW (NEEDLE) IMPLANT
NS IRRIG 1000ML POUR BTL (IV SOLUTION) ×12 IMPLANT
PACK CAROTID (CUSTOM PROCEDURE TRAY) ×4 IMPLANT
PAD ARMBOARD 7.5X6 YLW CONV (MISCELLANEOUS) ×8 IMPLANT
PATCH VASC XENOSURE 1CMX6CM (Vascular Products) ×4 IMPLANT
PATCH VASC XENOSURE 1X6 (Vascular Products) IMPLANT
SET MICROPUNCTURE 5F STIFF (MISCELLANEOUS) ×2 IMPLANT
SHUNT CAROTID BYPASS 10 (VASCULAR PRODUCTS) ×1 IMPLANT
SHUNT CAROTID BYPASS 12FRX15.5 (VASCULAR PRODUCTS) IMPLANT
SPONGE SURGIFOAM ABS GEL 100 (HEMOSTASIS) IMPLANT
STOPCOCK 4 WAY LG BORE MALE ST (IV SETS) IMPLANT
STOPCOCK MORSE 400PSI 3WAY (MISCELLANEOUS) ×1 IMPLANT
SUT ETHILON 3 0 PS 1 (SUTURE) IMPLANT
SUT MNCRL AB 4-0 PS2 18 (SUTURE) ×4 IMPLANT
SUT PROLENE 5 0 C 1 24 (SUTURE) ×4 IMPLANT
SUT PROLENE 6 0 BV (SUTURE) ×9 IMPLANT
SUT PROLENE BLUE 7 0 (SUTURE) ×1 IMPLANT
SUT SILK 3 0 (SUTURE)
SUT SILK 3-0 18XBRD TIE 12 (SUTURE) IMPLANT
SUT VIC AB 2-0 CT1 27 (SUTURE) ×4
SUT VIC AB 2-0 CT1 TAPERPNT 27 (SUTURE) ×3 IMPLANT
SUT VIC AB 3-0 SH 27 (SUTURE) ×4
SUT VIC AB 3-0 SH 27X BRD (SUTURE) ×3 IMPLANT
SYR CONTROL 10ML LL (SYRINGE) ×1 IMPLANT
TOWEL GREEN STERILE (TOWEL DISPOSABLE) ×4 IMPLANT
TUBING ART PRESS 48 MALE/FEM (TUBING) IMPLANT
TUBING CIL FLEX 10 FLL-RA (TUBING) ×1 IMPLANT
WATER STERILE IRR 1000ML POUR (IV SOLUTION) ×4 IMPLANT
WIRE BENTSON .035X145CM (WIRE) ×1 IMPLANT

## 2019-06-02 NOTE — Op Note (Signed)
OPERATIVE NOTE  PROCEDURE:   1.  Right carotid endarterectomy with bovine patch angioplasty 2.  Right intraoperative carotid ultrasound 3.  Right carotid and cerebral angiogram   PRE-OPERATIVE DIAGNOSIS: right asymptomatic high grade carotid stenosis  POST-OPERATIVE DIAGNOSIS: right asymptomatic high grade carotid stenosis and intra-cranial tandem stenosis  SURGEON: Marty Heck, MD  ASSISTANT(S): Leontine Locket, PA  ANESTHESIA: general  ESTIMATED BLOOD LOSS: 150 mL  FINDING(S): Right carotid endarterectomy with patch angioplasty onto the ICA for high grade >80% focal stenosis.  It should be noted that this was a high bifurcation and there was a very focal high-grade lesion in the proximal ICA just past the bifurcation.  I had to tack down the distal ICA flap.  Completion intraoperative duplex had a thumping Doppler signal in the ICA but a triphasic waveform in the ECA.  Intraoperative duplex showed no evidence of intimal flap with a widely patent patch onto the ICA.  I then stuck the patch with a micro access needle and placed a micro-sheath.  A right carotid and cerebral angiogram was then obtained that showed a widely patent carotid endarterectomy site with a patent ICA up to the skull base.  It appears that patient has several tandem intracranial stenotic lesions and I suspect this is the thumping Doppler signal consistent with his preop exam.  I elected not to further intervene and to awaken him from anesthesia where he had an intact neurologic exam with no focal deficits.   SPECIMEN(S):  Carotid plaque (sent to Pathology)  INDICATIONS:   Mike Shepard is a 53 y.o. male who was initially referred for asymptomatic high-grade right carotid stenosis.  His carotid duplex showed a velocity of 26/4 with a thumping signal and concern for greater than 80% severe plaque at the bifurcation in the ICA.  I obtained a CTA neck to further evaluate and this confirmed he did have a  high-grade proximal stenosis with a very diminutive artery distally and at least patent up to the skull base.  He presents today for right carotid endarterectomy.  The patient is aware that the risks of carotid endarterectomy include but are not limited to: bleeding, infection, stroke, myocardial infarction, death, cranial nerve injuries both temporary and permanent, neck hematoma, possible airway compromise, labile blood pressure post-operatively, cerebral hyperperfusion syndrome, and possible need for additional interventions in the future. The patient is aware of the risks and agrees to proceed forward with the procedure.  DESCRIPTION: After full informed written consent was obtained from the patient, the patient was brought back to the operating room and placed supine upon the operating table.  Prior to induction, the patient received IV antibiotics.  After obtaining adequate anesthesia, the patient was placed into semi-Fowler position with a shoulder roll in place and the patient's neck slightly hyperextended and rotated away from the surgical site.  The patient was prepped in the standard fashion for a right carotid endarterectomy.  I made an incision anterior to the sternocleidomastoid muscle and dissected down through the subcutaneous tissue.  The platysma was opened with electrocautery.  I then used Bovie cautery and blunt dissection to dissect through the underlying platysma and to mobilize the anterior border of the sternocleidomastoid as well as the internal jugular vein laterally.  The facial vein was ligated with 3-0 silk and surgical clips and divided.  After identifying the carotid artery I used Metzenbaum scissors to bluntly dissect the common carotid artery and then controlled this with both a large vessel loop  and a umbilical tape.  At this point in time the patient was given 100 units/kg of IV heparin and we checked an ACT to ensure it was greater than 250.  I then carried my dissection  cephalad and mobilized the external carotid artery and superior thyroid artery and controlled each of these with a vessel loop.  I then dissected out the internal carotid artery well past the distal plaque.  The internal carotid artery was very small distally.  The internal carotid artery was then controlled with a umbilical tape as well. I was careful to identify the vagus nerve between the internal jugular and common carotid and this was presereved.  I was also careful to identify and preserve the hypoglossal nerve and this was preserved.   I had to mobilize the hypoglossal nerve to get distal enough on the ICA.  Once our ACT was confirmed, I proceeded by clamping the internal carotid artery with a angled bulldog clamp first.  The proximal common carotid artery was controlled with a angled debakey clamp.  The external carotid was controlled with a vessel loop.  I subsequently opened the common carotid artery with an 11 blade scalpel in longitudinal fashion and extended the arteriotomy with Potts scissors onto the ICA past the distal plaque.  At this point a 10 [place] shunt was brought to the field and then initially placed distally into the ICA after removing the Satinsky clamp and controlled with a vessel loop. The shunt passed easily but there was no backbleeding from the ICA even before the shunt was placed.  I then placed the proximal end of the shunt in the common carotid artery and controlled this with a Rummel tourniquet.  I used Doppler to confirm there was flow in the shunt. I then used a Garment/textile technologist and performed a endarterectomy starting in the common carotid artery.  The external carotid artery was endarterectomized with an eversion technique and I was careful to feather the distal ICA plaque.  The plaque did not featheter distally like I wanted, so I tacked this down with 7-0 prolene sutures.  The specimen was passed off the field.  The endarterectomy site was then flushed with  heparinized saline and I was careful to ensure there were no flaps in the endarterectomy site.  I then brought a bovine carotid patch on the field and this was sewn in place with a running anastomosis using a 6-0 Prolene distally and a 5-0 proximal.  The bovine patch was trimmed accordingly.  The shunt was removed just before completion of the patch.  The artery was flushed antegrade and retrograde prior to completion of the patch.  Once the patch was complete, I flushed up the external carotid artery first prior to releasing the internal carotid artery clamp.   Then used a Doppler in the external carotid artery had an excellent triphasic signal.  Unfortunately distal to our patch the ICA had a thumping Doppler signal.  I was very concerned with this so I brought a sterile ultrasound probe on the field and evaluated our endarterectomy site.  The endarterectomy was widely patent from the common carotid artery on the ICA and I did not see any intimal flap.  I then elected to shoot a carotid angiogram with cerebral arteriogram.  The patch was then accessed down on the common carotid artery with a micro access needle microwire was passed and exchanged for micro-sheath.  Using extension tubing a fluoroscopic C arm was brought in and we shot  a right carotid angiogram with cerebral arteriogram.  On the right the common carotid artery, endarterectomized carotid as well as the ECA were all widely patent.  The ICA was widely patent with no dissection or other flow-limiting stenosis up to the skull base.  Patient then appeared to have multiple tandem intracranial lesions.  I suspect this was the thumping signal noted on preoperative exam.  I did not feel there is any other intervention needed.  At that point in time the patient was given protamine for reversal.  I got hemostasis from the patch with Surgicel snow.  I did have to place several additional 6-0 Prolenes in figure-of-eight fashion.  Ultimately the platysma was  closed in running fashion with 3-0 Vicryl.  The skin was closed with a running 4-0 Monocryl.  Dermabond was applied with a dry sterile dressing.  The patient was awakened from anesthesia with no new neurological deficit and taken to PACU in stable condition.    COMPLICATIONS: None  CONDITION: Stable  Marty Heck, MD [facility] Office: [phone] Pager: [phone]  06/02/2019, 1:30 PM

## 2019-06-02 NOTE — Transfer of Care (Signed)
Immediate Anesthesia Transfer of Care Note  Patient: Mike Shepard  Procedure(s) Performed: ENDARTERECTOMY CAROTID RIGHT (Right Neck)  Patient Location: PACU  Anesthesia Type:General  Level of Consciousness: awake, alert , oriented and patient cooperative  Airway & Oxygen Therapy: Patient Spontanous Breathing  Post-op Assessment: Report given to RN, Post -op Vital signs reviewed and stable, Patient moving all extremities X 4 and Patient able to stick tongue midline  Post vital signs: Reviewed and stable  Last Vitals:  Vitals Value Taken Time  BP    Temp    Pulse    Resp    SpO2      Last Pain:  Vitals:   06/02/19 0754  TempSrc: Oral  PainSc:          Complications: No apparent anesthesia complications

## 2019-06-02 NOTE — Progress Notes (Signed)
Called to see pt for episode of gray cloud over right eye that lasted a few minutes then resolved.  This happened about 45 min ago. It has now resolved and his vision is normal.  Otherwise Neuro intact. No hematoma.  Monitor for now if recurs will consider heparin  Ruta Hinds, MD [facility] Office: [phone] Pager: [phone]

## 2019-06-02 NOTE — H&P (Signed)
History and Physical Interval Note:  06/02/2019 10:10 AM  Mike Shepard  has presented today for surgery, with the diagnosis of RIGHT CAROTID STENOSIS.  The various methods of treatment have been discussed with the patient and family. After consideration of risks, benefits and other options for treatment, the patient has consented to  Procedure(s): ENDARTERECTOMY CAROTID RIGHT (Right) as a surgical intervention.  The patient's history has been reviewed, patient examined, no change in status, stable for surgery.  I have reviewed the patient's chart and labs.  Questions were answered to the patient's satisfaction.    R CEA.  Marty Heck  Patient name: Mike Shepard   MRN: [id number]        DOB: 08/15/1966          Sex: male  REASON FOR CONSULT: Evaluate carotid stenosis  HPI: Mike Shepard is a 53 y.o. male, with history of chronic back pain, hypertension, hyperlipidemia, pre-diabetes and remote tobacco abuse that presents for evaluation of carotid stenosis.  Reportedly patient had an MRI last year for a headache that documented a concern for carotid disease.  Patient's primary care doctor attempted to order duplex for further surveillance but patient states he never got the study done until last month.  Carotid duplex on 05/14/2019 showed greater than 80% right ICA stenosis with very sluggish velocities and a thumping Doppler signal.  Left ICA had minimal changes and otherwise patent.  Patient denies any recent history of strokes TIAs weakness numbness or vision loss in the last 6 to 12 months.  He thinks someone told him he had a mini stroke years ago but is not really sure because he never saw a neurologist.  States he has had cervical fusion through a left approach.      Past Medical History:  Diagnosis Date  . Chronic back pain    see surgeries   . GERD (gastroesophageal reflux disease)   . H/O: gout   . Hyperlipidemia   . Hypertension   . PPD positive     Positive PPD, remotely ~2000, s/p Rx    . Primary osteoarthritis of right hip    end stage         Past Surgical History:  Procedure Laterality Date  . BACK SURGERY     2007-2013 (low back)  . NECK SURGERY     2010         Family History  Problem Relation Age of Onset  . Hypertension Mother        M,F,Sis, bro  . Diabetes Father        F, M. B  . Diabetes Sister   . Hypertension Sister   . Diabetes Brother   . Hypertension Brother   . Prostate cancer Paternal Grandfather        dx ~ 3 y/o  . Anesthesia problems Neg Hx   . Colon cancer Neg Hx   . CAD Neg Hx   . Stroke Neg Hx     SOCIAL HISTORY: Social History        Socioeconomic History  . Marital status: Married    Spouse name: Not on file  . Number of children: 3  . Years of education: Not on file  . Highest education level: Not on file  Occupational History  . Occupation: city of [place] ([employer]  Social Needs  . Financial resource strain: Not on file  . Food insecurity:    Worry: Not on file    Inability:  Not on file  . Transportation needs:    Medical: Not on file    Non-medical: Not on file  Tobacco Use  . Smoking status: Former Smoker    Types: Cigarettes    Last attempt to quit: 01/27/2010    Years since quitting: 9.3  . Smokeless tobacco: Current User    Types: Chew  . Tobacco comment: chews sometimes; quit smoking~ 12-2014, quit vaping     Substance and Sexual Activity  . Alcohol use: Yes    Alcohol/week: 2.0 standard drinks    Types: 1 Glasses of wine, 1 Cans of beer per week    Comment: socially   . Drug use: No    Comment: denies   . Sexual activity: Yes    Birth control/protection: None  Lifestyle  . Physical activity:    Days per week: Not on file    Minutes per session: Not on file  . Stress: Not on file  Relationships  . Social connections:    Talks on phone: Not on file    Gets together: Not on file     Attends religious service: Not on file    Active member of club or organization: Not on file    Attends meetings of clubs or organizations: Not on file    Relationship status: Not on file  . Intimate partner violence:    Fear of current or ex partner: Not on file    Emotionally abused: Not on file    Physically abused: Not on file    Forced sexual activity: Not on file  Other Topics Concern  . Not on file  Social History Narrative   Lives w/ wife, 3 children plus adopted 3 children    No Known Allergies        Current Outpatient Medications  Medication Sig Dispense Refill  . amLODipine (NORVASC) 10 MG tablet Take 1 tablet (10 mg total) by mouth daily. 90 tablet 1  . atenolol (TENORMIN) 100 MG tablet Take 1 tablet (100 mg total) by mouth daily. 90 tablet 1  . atorvastatin (LIPITOR) 20 MG tablet Take 1 tablet (20 mg total) by mouth at bedtime. 30 tablet 3  . buprenorphine (SUBUTEX) 8 MG SUBL SL tablet     . losartan (COZAAR) 100 MG tablet Take 1 tablet (100 mg total) by mouth daily. 90 tablet 1  . Multiple Vitamin (MULTIVITAMIN WITH MINERALS) TABS Take 1 tablet by mouth daily.    Marland Kitchen omeprazole (PRILOSEC) 20 MG capsule Take 1 capsule (20 mg total) by mouth daily before breakfast. 90 capsule 3  . celecoxib (CELEBREX) 200 MG capsule Take 1 capsule (200 mg total) by mouth daily. (Patient not taking: Reported on 05/20/2019) 30 capsule 6  . NARCAN 4 MG/0.1ML LIQD nasal spray kit USE AS DIRECTED     No current facility-administered medications for this visit.     REVIEW OF SYSTEMS:  _0  denotes positive finding, _1  denotes negative finding Cardiac  Comments:  Chest pain or chest pressure:    Shortness of breath upon exertion:    Short of breath when lying flat:    Irregular heart rhythm:        Vascular    Pain in calf, thigh, or hip brought on by ambulation:    Pain in feet at night that wakes you up from your sleep:     Blood clot in your  veins:    Leg swelling:         Pulmonary  Oxygen at home:    Productive cough:     Wheezing:         Neurologic    Sudden weakness in arms or legs:     Sudden numbness in arms or legs:     Sudden onset of difficulty speaking or slurred speech:    Temporary loss of vision in one eye:     Problems with dizziness:         Gastrointestinal    Blood in stool:     Vomited blood:         Genitourinary    Burning when urinating:     Blood in urine:        Psychiatric    Major depression:         Hematologic    Bleeding problems:    Problems with blood clotting too easily:        Skin    Rashes or ulcers:        Constitutional    Fever or chills:      PHYSICAL EXAM:     Vitals:   05/20/19 1538 05/20/19 1542  BP: (!) 152/85 (!) 152/87  Pulse: 66 66  Resp: 16   Temp: (!) 97.1 F (36.2 C)   TempSrc: Oral   SpO2: 97%   Weight: 242 lb (109.8 kg)   Height: 6' (1.829 m)     GENERAL: The patient is a well-nourished male, in no acute distress. The vital signs are documented above. CARDIAC: There is a regular rate and rhythm.  VASCULAR:  2+ radial pulse bilateral upper extremities 2+ carotid pulse bilaterally Left neck incision well healed from cervical operation PULMONARY: There is good air exchange bilaterally without wheezing or rales. ABDOMEN: Soft and non-tender with normal pitched bowel sounds.  MUSCULOSKELETAL: There are no major deformities or cyanosis. NEUROLOGIC: No focal weakness or paresthesias are detected.  CN II-XII grossly intact. SKIN: There are no ulcers or rashes noted. PSYCHIATRIC: The patient has a normal affect.  DATA:   Carotid duplex suggest a right ICA stenosis of 80+% with velocity of 26/4 with severe plaque noted.  There is reconstitution distally.  Assessment/Plan:  53 year old male with suspected high-grade right ICA stenosis given the recent  carotid duplex findings.  He is asymptomatic from a carotid standpoint and would benefit from right carotid enterectomy given the degree of stenosis.  I did look at his carotid bifurcation in clinic with ultrasound myself and had some difficulty visualizing his bifurcation as well as any distal extent of the lesion.  As a result I have recommended CTA neck to further evaluate his anatomy and the lesion itself.  We will tentatively put him on the schedule for Monday, June 15 for right carotid endarterectomy.  He is to remain on aspirin in the interim and statin.  Discussed goal of surgery is reducing risk of stroke.  Discussed risks of surgery including bleeding, stroke, nerve injury, risk of restenosis in the future etc.   Marty Heck, MD [facility] Office: [phone] Pager: [phone]

## 2019-06-02 NOTE — Progress Notes (Signed)
Pt informed RN of vision changes to his R eye, seeing "grey".  RN informed Dr. Oneida Alar, On Call Vascular, he agreed that night RN will just monitor & inform him if needed.

## 2019-06-02 NOTE — Telephone Encounter (Addendum)
-----   Message from Marty Heck, MD sent at 06/02/2019  3:38 PM EDT -----  Regarding: RE: Surgery  Thanks!  I just called her.  ----- Message ----- From: Kaleen Mask, LPN Sent: 01/05/8676   2:50 PM EDT To: Marty Heck, MD Subject: Surgery                                        Hey Dr. Carlis Abbott.  Patients mother, Kwinton Maahs, called.  She did not leave the reason but she would like for you to call her at [phone] at your earliest convenience regarding patient's Right CEA.  Thanks,   Thurston Hole., LPN

## 2019-06-02 NOTE — Anesthesia Procedure Notes (Signed)
Arterial Line Insertion Start/End6/15/2020 9:00 AM Performed by: Alain Marion, CRNA, CRNA  Patient location: Pre-op. Preanesthetic checklist: patient identified, IV checked, site marked, risks and benefits discussed, surgical consent, monitors and equipment checked, pre-op evaluation, timeout performed and anesthesia consent Lidocaine 1% used for infiltration Left, radial was placed Catheter size: 20 G Hand hygiene performed  and maximum sterile barriers used   Attempts: 1 Procedure performed without using ultrasound guided technique. Following insertion, dressing applied and Biopatch. Post procedure assessment: normal and unchanged  Patient tolerated the procedure well with no immediate complications.

## 2019-06-02 NOTE — Anesthesia Postprocedure Evaluation (Signed)
Anesthesia Post Note  Patient: Mike Shepard  Procedure(s) Performed: ENDARTERECTOMY CAROTID RIGHT (Right Neck)     Patient location during evaluation: PACU Anesthesia Type: General Level of consciousness: awake Pain management: pain level controlled Vital Signs Assessment: post-procedure vital signs reviewed and stable Respiratory status: spontaneous breathing Cardiovascular status: stable Postop Assessment: no apparent nausea or vomiting Anesthetic complications: no    Last Vitals:  Vitals:   06/02/19 1405 06/02/19 1420  BP: 104/61 (!) 105/58  Pulse: (!) 50 (!) 48  Resp: (!) 8 (!) 9  Temp:    SpO2: 94% 93%    Last Pain:  Vitals:   06/02/19 1405  TempSrc:   PainSc: 7                  Yuritzy Zehring

## 2019-06-02 NOTE — Anesthesia Procedure Notes (Signed)
Procedure Name: Intubation Date/Time: 06/02/2019 10:45 AM Performed by: Julieta Bellini, CRNA Pre-anesthesia Checklist: Patient identified, Emergency Drugs available, Suction available and Patient being monitored Patient Re-evaluated:Patient Re-evaluated prior to induction Oxygen Delivery Method: Circle system utilized Preoxygenation: Pre-oxygenation with 100% oxygen Induction Type: IV induction and Rapid sequence Ventilation: Mask ventilation without difficulty Laryngoscope Size: Mac and 4 Grade View: Grade I Tube type: Oral Tube size: 7.5 mm Number of attempts: 1 Airway Equipment and Method: Stylet Placement Confirmation: ETT inserted through vocal cords under direct vision,  positive ETCO2 and breath sounds checked- equal and bilateral Secured at: 22 cm Tube secured with: Tape Dental Injury: Teeth and Oropharynx as per pre-operative assessment

## 2019-06-02 NOTE — Anesthesia Preprocedure Evaluation (Signed)
Anesthesia Evaluation  Patient identified by MRN, date of birth, ID band Patient awake    Reviewed: Allergy & Precautions, NPO status , Patient's Chart, lab work & pertinent test results  Airway Mallampati: II  TM Distance: >3 FB     Dental   Pulmonary former smoker,    breath sounds clear to auscultation       Cardiovascular hypertension,  Rhythm:Regular Rate:Normal     Neuro/Psych    GI/Hepatic Neg liver ROS, GERD  ,  Endo/Other  negative endocrine ROS  Renal/GU negative Renal ROS     Musculoskeletal   Abdominal   Peds  Hematology   Anesthesia Other Findings   Reproductive/Obstetrics                             Anesthesia Physical Anesthesia Plan  ASA: III  Anesthesia Plan: General   Post-op Pain Management:    Induction: Intravenous  PONV Risk Score and Plan: Ondansetron and Midazolam  Airway Management Planned: Oral ETT  Additional Equipment:   Intra-op Plan:   Post-operative Plan: Extubation in OR  Informed Consent: I have reviewed the patients History and Physical, chart, labs and discussed the procedure including the risks, benefits and alternatives for the proposed anesthesia with the patient or authorized representative who has indicated his/her understanding and acceptance.     Dental advisory given  Plan Discussed with: CRNA, Anesthesiologist and Surgeon  Anesthesia Plan Comments:         Anesthesia Quick Evaluation

## 2019-06-03 ENCOUNTER — Encounter (HOSPITAL_COMMUNITY): Payer: Self-pay | Admitting: Vascular Surgery

## 2019-06-03 LAB — BASIC METABOLIC PANEL
Anion gap: 7 (ref 5–15)
BUN: 13 mg/dL (ref 6–20)
CO2: 23 mmol/L (ref 22–32)
Calcium: 9 mg/dL (ref 8.9–10.3)
Chloride: 105 mmol/L (ref 98–111)
Creatinine, Ser: 1.03 mg/dL (ref 0.61–1.24)
GFR calc Af Amer: >60 mL/min (ref >60)
GFR calc non Af Amer: >60 mL/min (ref >60)
Glucose, Bld: 157 mg/dL — ABNORMAL HIGH (ref 70–99)
Potassium: 4.3 mmol/L (ref 3.5–5.1)
Sodium: 135 mmol/L (ref 135–145)

## 2019-06-03 LAB — CBC
HCT: 37.8 % — ABNORMAL LOW (ref 39.0–52.0)
Hemoglobin: 12.8 g/dL — ABNORMAL LOW (ref 13.0–17.0)
MCH: 28.9 pg (ref 26.0–34.0)
MCHC: 33.9 g/dL (ref 30.0–36.0)
MCV: 85.3 fL (ref 80.0–100.0)
Platelets: 210 10*3/uL (ref 150–400)
RBC: 4.43 MIL/uL (ref 4.22–5.81)
RDW: 11.9 % (ref 11.5–15.5)
WBC: 13.3 10*3/uL — ABNORMAL HIGH (ref 4.0–10.5)
nRBC: 0 % (ref 0.0–0.2)

## 2019-06-03 MED ORDER — CLOPIDOGREL BISULFATE 75 MG PO TABS
75.0000 mg | ORAL_TABLET | Freq: Every day | ORAL | Status: DC
  Administered 2019-06-03 – 2019-06-04 (×2): 75 mg via ORAL
  Filled 2019-06-03 (×2): qty 1

## 2019-06-03 MED ORDER — BUPRENORPHINE HCL-NALOXONE HCL 8-2 MG SL SUBL
1.0000 | SUBLINGUAL_TABLET | Freq: Two times a day (BID) | SUBLINGUAL | Status: DC
  Administered 2019-06-03 – 2019-06-04 (×2): 1 via SUBLINGUAL
  Filled 2019-06-03 (×2): qty 1

## 2019-06-03 NOTE — Progress Notes (Addendum)
  Progress Note    06/03/2019 7:37 AM 1 Day Post-Op  Subjective:  States he had a grey cloud over his right eye last night.  Resolved form the bottom up and is now completely resolved.  Has voided.  Has not walked.   Afebrile HR 40's-50's  382'N-053'Z systolic 76% [id number]  Vitals:   06/03/19 0400 06/03/19 0604  BP: (!) 134/59 (!) 105/52  Pulse: (!) 57 (!) 49  Resp: 11 11  Temp: 98.3 F (36.8 C) 98.3 F (36.8 C)  SpO2: 94% 96%     Physical Exam: Neuro:  In tact Lungs:  Non labored Incision:  Clean and dry  CBC    Component Value Date/Time   WBC 13.3 (H) 06/03/2019 0420   RBC 4.43 06/03/2019 0420   HGB 12.8 (L) 06/03/2019 0420   HCT 37.8 (L) 06/03/2019 0420   PLT 210 06/03/2019 0420   MCV 85.3 06/03/2019 0420   MCH 28.9 06/03/2019 0420   MCHC 33.9 06/03/2019 0420   RDW 11.9 06/03/2019 0420   LYMPHSABS 3.5 08/08/2017 1422   MONOABS 0.7 08/08/2017 1422   EOSABS 0.2 08/08/2017 1422   BASOSABS 0.1 08/08/2017 1422    BMET    Component Value Date/Time   NA 135 06/03/2019 0420   K 4.3 06/03/2019 0420   CL 105 06/03/2019 0420   CO2 23 06/03/2019 0420   GLUCOSE 157 (H) 06/03/2019 0420   BUN 13 06/03/2019 0420   CREATININE 1.03 06/03/2019 0420   CALCIUM 9.0 06/03/2019 0420   GFRNONAA >60 06/03/2019 0420   GFRAA >60 06/03/2019 0420     Intake/Output Summary (Last 24 hours) at 06/03/2019 0737 Last data filed at 06/03/2019 0454 Gross per 24 hour  Intake 2938.31 ml  Output 1050 ml  Net 1888.31 ml     Assessment/Plan:  This is a 53 y.o. male who is s/p 1 Day Post-Op 1.  Right carotid endarterectomy with bovine patch angioplasty 2.  Right intraoperative carotid ultrasound 3.  Right carotid and cerebral angiogram   -pt is doing well this am. -pt neuro exam is in tact-had a cloudy grey cloud cover his right eye that lasted about 30 minutes last evening.  Completely resolved.  No other deficits.   -pt has not ambulated -pt has voided -f/u with Dr. Carlis Abbott in 2  weeks.   Leontine Locket, PA-C [facility] [phone]  I have seen and evaluated the patient. I agree with the PA note as documented above. POD#1 s/p R CEA.  States he had a grey cloud over right eye for about 30 min last night - totally resolved.  Neuro intact this am.  Neck looks great.  Will add dual antiplatelet therapy with aspirin and plavix given possible atheroembolic event after carotid surgery - although etiology unclear.  Need to wean off dopamine and oxygen this am.  Possible discharge this afternoon vs tomorrow.  Will plan f/u 2-3 weeks for wound check.  Marty Heck, MD [facility] [place] Office: [phone] Pager: [phone]

## 2019-06-03 NOTE — Progress Notes (Signed)
PA notified ref to pt hr @48 . Advised to hold BP meds (Atenolol and Norvasc). Will have parameters for pt when discharging regarding these meds.  Jerald Kief, RN

## 2019-06-03 NOTE — Progress Notes (Signed)
Pt completely off dopamine earlier today and is hemodynamically stable.  He does have HR running in the low 40's.  He takes Atenolol and Norvasc at home.  Considering his BP is 697-948'A systolic and HR in low 16'P, will hold both atenolol and norvasc.  Will keep another night and re-evaluate in am.  Discussed with pt and he is in agreement.    Leontine Locket, [facility] 06/03/2019 2:19 PM

## 2019-06-03 NOTE — Progress Notes (Signed)
Dopamine weaned off as ordered by Dr. Carlis Abbott. Pt current BP 139/72 Patient assist x1 to chair. Call bell with in reach. Will monitor patient. Mike Shepard, Bettina Gavia rN

## 2019-06-03 NOTE — Plan of Care (Signed)
  Problem: Activity: Goal: Ability to tolerate increased activity will improve Outcome: Progressing   Problem: Health Behavior/Discharge Planning: Goal: Ability to manage health-related needs will improve Outcome: Progressing   Problem: Elimination: Goal: Will not experience complications related to urinary retention Outcome: Progressing

## 2019-06-04 MED ORDER — ASPIRIN 81 MG PO TBEC: 81.0000 mg | DELAYED_RELEASE_TABLET | Freq: Every day | ORAL | Status: DC

## 2019-06-04 MED ORDER — ATENOLOL 25 MG PO TABS
50.0000 mg | ORAL_TABLET | Freq: Every day | ORAL | Status: DC
  Administered 2019-06-04: 08:00:00 50 mg via ORAL

## 2019-06-04 MED ORDER — CLOPIDOGREL BISULFATE 75 MG PO TABS: 75.0000 mg | ORAL_TABLET | Freq: Every day | ORAL | 6 refills | Status: DC

## 2019-06-04 MED ORDER — ATENOLOL 100 MG PO TABS: 50.0000 mg | ORAL_TABLET | Freq: Every day | ORAL | Status: DC

## 2019-06-04 NOTE — Discharge Summary (Signed)
Discharge Summary     Mike Shepard 1966-04-18 53 y.o. male  [id number]  Admission Date: 06/02/2019  Discharge Date: 06/06/2019  Physician: No att. providers found  Admission Diagnosis: RIGHT CAROTID STENOSIS   HPI:   This is a 52 y.o. male with history of chronic back pain, hypertension, hyperlipidemia,pre-diabetes and remote tobacco abuse thatpresents for evaluation of carotid stenosis. Reportedly patient had an MRI last year for a headache that documented a concern for carotid disease. Patient's primary care doctor attempted to order duplex for further surveillance but patient states he never got the study done until last month. Carotid duplex on 05/14/2019 showed greater than 80% right ICA stenosis with very sluggish velocities and a thumping Doppler signal. Left ICA had minimal changes and otherwise patent. Patient denies any recent history of strokes TIAs weakness numbness or vision loss in the last 6 to 12 months. He thinks someone told him he had a ministroke years ago but is not really sure because he never saw a neurologist.  States he has had cervical fusion through a left approach.  Hospital Course:  The patient was admitted to the hospital and taken to the operating room on 06/02/2019 and underwent: 1.  Right carotid endarterectomy with bovine patch angioplasty 2.  Right intraoperative carotid ultrasound 3.  Right carotid and cerebral angiogram.    Findings: Right carotid endarterectomy with patch angioplasty onto the ICA for high grade >80% focal stenosis.  It should be noted that this was a high bifurcation and there was a very focal high-grade lesion in the proximal ICA just past the bifurcation.  I had to tack down the distal ICA flap.  Completion intraoperative duplex had a thumping Doppler signal in the ICA but a triphasic waveform in the ECA.  Intraoperative duplex showed no evidence of intimal flap with a widely patent patch onto the ICA.  I then  stuck the patch with a micro access needle and placed a micro-sheath.  A right carotid and cerebral angiogram was then obtained that showed a widely patent carotid endarterectomy site with a patent ICA up to the skull base.  It appears that patient has several tandem intracranial stenotic lesions and I suspect this is the thumping Doppler signal consistent with his preop exam.  I elected not to further intervene and to awaken him from anesthesia where he had an intact neurologic exam with no focal deficits.  The pt tolerated the procedure well and was transported to the PACU in good condition.  Later that evening, MD was called to see pt for episode of gray cloud over right eye that lasted a few minutes then resolved.  This happened about 45 min ago. It has now resolved and his vision is normal.  Otherwise Neuro intact. No hematoma.  Monitor for now if recurs will consider heparin    By POD 1, the pt neuro status was in tact.  No further issues with visual changes.  Neck looks great.  Will add dual antiplatelet therapy with aspirin and plavix given possible atheroembolic event after carotid surgery - although etiology unclear.  Need to wean off dopamine and oxygen this am.   Later that afternoon, pt was completely off dopamine and hemodynamically stable. He does have HR running in the low 40's.  He takes Atenolol and Norvasc at home.  Considering his BP is 191-478'G systolic and HR in low 95'A, will hold both atenolol and norvasc.  Will keep another night and re-evaluate in am.  Discussed with pt  and he is in agreement.   By POD 2, he had no further complaints and no more issues with visual disturbances.  His HR remained in the 40's (prior to surgery, it was 49bpm).  His atenolol dose was cut in half.  He was discharged home and will f/u with his PCP early next week to evaluate meds and BP.    The remainder of the hospital course consisted of increasing mobilization and increasing intake of solids  without difficulty.   No results for input(s): NA, K, CL, CO2, GLUCOSE, BUN, CALCIUM in the last 72 hours.  Invalid input(s): CRATININE No results for input(s): WBC, HGB, HCT, PLT in the last 72 hours. No results for input(s): INR in the last 72 hours.   Discharge Instructions    Discharge patient   Complete by: As directed    Discharge disposition: 01-Home or Self Care   Discharge patient date: 06/04/2019      Discharge Diagnosis:  RIGHT CAROTID STENOSIS  Secondary Diagnosis: Patient Active Problem List   Diagnosis Date Noted   Asymptomatic stenosis of right carotid artery without infarction 06/02/2019   Carotid stenosis 05/20/2019   H/O: CVA  per MRI 05/14/2018   DJD (degenerative joint disease) 04/21/2016   PCP NOTES >>>>> 11/06/2015   Hyperglycemia 12/28/2013   Abnormal TSH 12/28/2013   Annual physical exam 07/19/2012   Back pain 07/19/2012   GOUT 11/15/2010   Hyperlipidemia 08/29/2007   Essential hypertension 06/27/2007   GERD 06/27/2007   Past Medical History:  Diagnosis Date   Carotid artery stenosis    right   Chronic back pain    see surgeries    GERD (gastroesophageal reflux disease)    H/O: gout    Hyperlipidemia    Hypertension    PPD positive    Positive PPD, remotely ~2000, s/p Rx     Pre-diabetes    Primary osteoarthritis of right hip    end stage   Stroke [facility])    " mild"   Wears dentures     Allergies as of 06/04/2019   No Known Allergies     Medication List    STOP taking these medications   Goodys Extra Strength 520-260-32.5 MG Pack Generic drug: Aspirin-Acetaminophen-Caffeine     TAKE these medications   amLODipine 10 MG tablet Commonly known as: NORVASC Take 1 tablet (10 mg total) by mouth daily. Notes to patient: Take tomorrow   aspirin 81 MG EC tablet Take 1 tablet (81 mg total) by mouth daily. Notes to patient: Take tomorrow   atenolol 100 MG tablet Commonly known as: TENORMIN Take 0.5  tablets (50 mg total) by mouth daily. What changed: how much to take Notes to patient: Take tomorrow   atorvastatin 20 MG tablet Commonly known as: LIPITOR Take 1 tablet (20 mg total) by mouth at bedtime. Notes to patient: Take @ bedtime tonight   buprenorphine 8 MG Subl SL tablet Commonly known as: SUBUTEX Place 8 mg under the tongue 2 (two) times a day. Notes to patient: Take tonight   clopidogrel 75 MG tablet Commonly known as: PLAVIX Take 1 tablet (75 mg total) by mouth daily. Notes to patient: Take tomorrow   losartan 100 MG tablet Commonly known as: COZAAR Take 1 tablet (100 mg total) by mouth daily.   multivitamin with minerals Tabs tablet Take 1 tablet by mouth daily. Notes to patient: Take tomorrow   omeprazole 20 MG capsule Commonly known as: PRILOSEC Take 1 capsule (20 mg total) by  mouth daily before breakfast.        [facility] Discharge Instructions Carotid Endarterectomy (CEA)  Please refer to the following instructions for your post-procedure care. Your surgeon or physician assistant will discuss any changes with you.  Activity  You are encouraged to walk as much as you can. You can slowly return to normal activities but must avoid strenuous activity and heavy lifting until your doctor tell you it's OK. Avoid activities such as vacuuming or swinging a golf club. You can drive after one week if you are comfortable and you are no longer taking prescription pain medications. It is normal to feel tired for serval weeks after your surgery. It is also normal to have difficulty with sleep habits, eating, and bowel movements after surgery. These will go away with time.  Bathing/Showering  You may shower after you come home. Do not soak in a bathtub, hot tub, or swim until the incision heals completely.  Incision Care  Shower every day. Clean your incision with mild soap and water. Pat the area dry with a clean towel. You do not  need a bandage unless otherwise instructed. Do not apply any ointments or creams to your incision. You may have skin glue on your incision. Do not peel it off. It will come off on its own in about one week. Your incision may feel thickened and raised for several weeks after your surgery. This is normal and the skin will soften over time. For Men Only: It's OK to shave around the incision but do not shave the incision itself for 2 weeks. It is common to have numbness under your chin that could last for several months.  Diet  Resume your normal diet. There are no special food restrictions following this procedure. A low fat/low cholesterol diet is recommended for all patients with vascular disease. In order to heal from your surgery, it is CRITICAL to get adequate nutrition. Your body requires vitamins, minerals, and protein. Vegetables are the best source of vitamins and minerals. Vegetables also provide the perfect balance of protein. Processed food has little nutritional value, so try to avoid this.  Medications  Resume taking all of your medications unless your doctor or physician assistant tells you not to.  If your incision is causing pain, you may take over-the- counter pain relievers such as acetaminophen (Tylenol). If you were prescribed a stronger pain medication, please be aware these medications can cause nausea and constipation.  Prevent nausea by taking the medication with a snack or meal. Avoid constipation by drinking plenty of fluids and eating foods with a high amount of fiber, such as fruits, vegetables, and grains.  Do not take Tylenol if you are taking prescription pain medications.  Follow Up  Our office will schedule a follow up appointment 2-3 weeks following discharge.  Please call us immediately for any of the following conditions   Increased pain, redness, drainage (pus) from your incision site.  Fever of 101 degrees or higher.  If you should develop stroke (slurred  speech, difficulty swallowing, weakness on one side of your body, loss of vision) you should call 911 and go to the nearest emergency room.   Reduce your risk of vascular disease:   Stop smoking. If you would like help call [facility] at 1-800-QUIT-NOW 219-655-7950) or [place] at ([phone].  Manage your cholesterol  Maintain a desired weight  Control your diabetes  Keep your blood pressure down   If you have any  questions, please call the office at [phone].  Prescriptions given: 1.  Plavix 75mg  daily #30 six refills 2.  Aspirin 81mg  daily (OTC)  **Change Atenolol to 50mg  daily.  Disposition: home  Patient's condition: is Good  Follow up: 1. Dr. Carlis Abbott in 2-3 weeks.   Leontine Locket, PA-C [facility] [phone]   --- For [facility] use ---   Modified Rankin score at D/C (0-6): 0  IV medication needed for:  1. Hypertension: No 2. Hypotension: Yes  Post-op Complications: Yes  1. Post-op CVA or TIA: No  If yes: Event classification (right eye, left eye, right cortical, left cortical, verterobasilar, other): n/a  If yes: Timing of event (intra-op, <6 hrs post-op, >=6 hrs post-op, unknown): n/a  2. CN injury: No  If yes: CN n/a injuried   3. Myocardial infarction: No  If yes: Dx by (EKG or clinical, Troponin): n/a  4.  CHF: No  5.  Dysrhythmia (new): No  6. Wound infection: No  7. Reperfusion symptoms: No  8. Return to OR: No  If yes: return to OR for (bleeding, neurologic, other CEA incision, other): n/a  Discharge medications: Statin use:  Yes ASA use:  Yes   Beta blocker use:  Yes ACE-Inhibitor use:  No  ARB use:  Yes CCB use: Yes P2Y12 Antagonist use: Yes, [ x] Plavix, [ ]  Plasugrel, [ ]  Ticlopinine, [ ]  Ticagrelor, [ ]  Other, [ ]  No for medical reason, [ ]  Non-compliant, [ ]  Not-indicated Anti-coagulant use:  No, [ ]  Warfarin, [ ]  Rivaroxaban, [ ]  Dabigatran,

## 2019-06-04 NOTE — Progress Notes (Signed)
Pt received discharge instructions and education. Pt IV removed and intact. CCMD notified/telebox removed. Pt denies any complaints. Vitals stable. Pt tx via wheelchair to valet to meet family. Pt has all belongings.  Jerald Kief, RN

## 2019-06-04 NOTE — Discharge Instructions (Signed)
[facility]  Discharge Instructions   Carotid Endarterectomy (CEA)  Please refer to the following instructions for your post-procedure care. Your surgeon or physician assistant will discuss any changes with you.  Activity  You are encouraged to walk as much as you can. You can slowly return to normal activities but must avoid strenuous activity and heavy lifting until your doctor tell you it's okay. Avoid activities such as vacuuming or swinging a golf club. You can drive after one week if you are comfortable and you are no longer taking prescription pain medications. It is normal to feel tired for serval weeks after your surgery. It is also normal to have difficulty with sleep habits, eating, and bowel movements after surgery. These will go away with time.  Bathing/Showering  Shower daily after you go home. Do not soak in a bathtub, hot tub, or swim until the incision heals completely.  Incision Care  Shower every day. Clean your incision with mild soap and water. Pat the area dry with a clean towel. You do not need a bandage unless otherwise instructed. Do not apply any ointments or creams to your incision. You may have skin glue on your incision. Do not peel it off. It will come off on its own in about one week. Your incision may feel thickened and raised for several weeks after your surgery. This is normal and the skin will soften over time.   For Men Only: It's okay to shave around the incision but do not shave the incision itself for 2 weeks. It is common to have numbness under your chin that could last for several months.  Diet  Resume your normal diet. There are no special food restrictions following this procedure. A low fat/low cholesterol diet is recommended for all patients with vascular disease. In order to heal from your surgery, it is CRITICAL to get adequate nutrition. Your body requires vitamins, minerals, and protein. Vegetables are the  best source of vitamins and minerals. Vegetables also provide the perfect balance of protein. Processed food has little nutritional value, so try to avoid this.  Medications  Resume taking all of your medications unless your doctor or physician assistant tells you not to. If your incision is causing pain, you may take over-the- counter pain relievers such as acetaminophen (Tylenol). If you were prescribed a stronger pain medication, please be aware these medications can cause nausea and constipation. Prevent nausea by taking the medication with a snack or meal. Avoid constipation by drinking plenty of fluids and eating foods with a high amount of fiber, such as fruits, vegetables, and grains.   Change your Atenolol to 50mg  daily.  Starting Plavix 75mg  daily and aspirin 81mg  daily.   Follow Up Follow up with your PCP early next week to evaluate your blood pressure and medications.   Our office will schedule a follow up appointment 2-3 weeks following discharge.  Please call us immediately for any of the following conditions   Increased pain, redness, drainage (pus) from your incision site.  Fever of 101 degrees or higher.  If you should develop stroke (slurred speech, difficulty swallowing, weakness on one side of your body, loss of vision) you should call 911 and go to the nearest emergency room.   Reduce your risk of vascular disease:   Stop smoking. If you would like help call [facility] at 1-800-QUIT-NOW 708-659-3113) or [place] at [phone].  Manage your cholesterol  Maintain a desired weight  Control your  diabetes  Keep your blood pressure down   If you have any questions, please call the office at [phone].

## 2019-06-04 NOTE — Progress Notes (Addendum)
  Progress Note    06/04/2019 8:01 AM 2 Days Post-Op  Subjective:  No complaints; feels fine.  No further issues with his eye  Afebrile HR 40's-50's SB 024'O-973'Z systolic 329% RA  Vitals:   06/04/19 0357 06/04/19 0749  BP: (!) 148/80 (!) 156/91  Pulse: (!) 53 (!) 44  Resp: 16 18  Temp: 97.7 F (36.5 C) 97.8 F (36.6 C)  SpO2: 96% 100%     Physical Exam: Neuro:  In tact; tongue midline; moving all extremities equally Lungs:  Non labored Incision:  Clean and dry without hematoma  CBC    Component Value Date/Time   WBC 13.3 (H) 06/03/2019 0420   RBC 4.43 06/03/2019 0420   HGB 12.8 (L) 06/03/2019 0420   HCT 37.8 (L) 06/03/2019 0420   PLT 210 06/03/2019 0420   MCV 85.3 06/03/2019 0420   MCH 28.9 06/03/2019 0420   MCHC 33.9 06/03/2019 0420   RDW 11.9 06/03/2019 0420   LYMPHSABS 3.5 08/08/2017 1422   MONOABS 0.7 08/08/2017 1422   EOSABS 0.2 08/08/2017 1422   BASOSABS 0.1 08/08/2017 1422    BMET    Component Value Date/Time   NA 135 06/03/2019 0420   K 4.3 06/03/2019 0420   CL 105 06/03/2019 0420   CO2 23 06/03/2019 0420   GLUCOSE 157 (H) 06/03/2019 0420   BUN 13 06/03/2019 0420   CREATININE 1.03 06/03/2019 0420   CALCIUM 9.0 06/03/2019 0420   GFRNONAA >60 06/03/2019 0420   GFRAA >60 06/03/2019 0420     Intake/Output Summary (Last 24 hours) at 06/04/2019 0801 Last data filed at 06/04/2019 0640 Gross per 24 hour  Intake 992.48 ml  Output 775 ml  Net 217.48 ml     Assessment/Plan:  This is a 53 y.o. male who is s/p right CEA 2 Days Post-Op  -pt is doing well this am.  HR remains in the mid 40's.  He came in with HR of 49.  Will cut the dose in half to discharge home on and he will f/u with his PCP early next week to evaluate meds and BP.  -pt neuro exam is in tact -pt has ambulated -pt has voided -f/u with Dr. Carlis Abbott in 2 weeks.   Leontine Locket, PA-C [facility] [phone]  I have seen and evaluated the patient. I agree  with the PA note as documented above. POD#2 s/p R CEA.  Doing well.  Neuro intact.  Weaned off dopamine.  HR improved to 50's today and asymptoamtic.  Will plan for d/c home today with aspirin and plavix for 4-6 weeks.  Will arrange followup for wound check in 3 weeks.  Cut atenolol dose to 1/2 given bradycardia, will have him follow-up with PCP for adjustment next week.  Overall looks very good today.  Marty Heck, MD [facility] [place] [facility] Office: [phone] Pager: [phone]

## 2019-06-05 ENCOUNTER — Encounter (HOSPITAL_COMMUNITY): Payer: Self-pay | Admitting: Vascular Surgery

## 2019-06-06 ENCOUNTER — Other Ambulatory Visit: Payer: Self-pay

## 2019-06-06 ENCOUNTER — Ambulatory Visit (INDEPENDENT_AMBULATORY_CARE_PROVIDER_SITE_OTHER): Payer: 59 | Admitting: Internal Medicine

## 2019-06-06 ENCOUNTER — Ambulatory Visit (HOSPITAL_COMMUNITY): Payer: Self-pay

## 2019-06-06 ENCOUNTER — Telehealth: Payer: Self-pay | Admitting: *Deleted

## 2019-06-06 ENCOUNTER — Telehealth: Payer: Self-pay | Admitting: Internal Medicine

## 2019-06-06 DIAGNOSIS — R42 Dizziness and giddiness: Secondary | ICD-10-CM | POA: Diagnosis not present

## 2019-06-06 DIAGNOSIS — I6521 Occlusion and stenosis of right carotid artery: Secondary | ICD-10-CM

## 2019-06-06 DIAGNOSIS — I10 Essential (primary) hypertension: Secondary | ICD-10-CM

## 2019-06-06 DIAGNOSIS — R001 Bradycardia, unspecified: Secondary | ICD-10-CM | POA: Diagnosis not present

## 2019-06-06 NOTE — Progress Notes (Signed)
Subjective:    Patient ID: Mike Shepard, male    DOB: 03-09-1966, 53 y.o.   MRN: [id number]  DOS:  06/06/2019 Type of visit - description: Virtual Visit via Video Note  I connected with@ on 06/06/19 at  3:20 PM EDT by a video enabled telemedicine application and verified that I am speaking with the correct person using two identifiers.   THIS ENCOUNTER IS A VIRTUAL VISIT DUE TO COVID-19 - PATIENT WAS NOT SEEN IN THE OFFICE. PATIENT HAS CONSENTED TO VIRTUAL VISIT / TELEMEDICINE VISIT   Location of patient: home  Location of provider: office  I discussed the limitations of evaluation and management by telemedicine and the availability of in person appointments. The patient expressed understanding and agreed to proceed.  History of Present Illness: TCM 7 The patient was admitted to the hospital for right endarterectomy surgery that took place 06/02/2019. Prior to the patient leaving home BP was low thus recommend to take less atenolol and hold losartan. The patient is following instructions correctly. Overall feels well, see review of systems.    Review of Systems  No  fever chills Pain is well controlled No chest pain but when asked he admits to feeling somewhat dizzy when he stands up.  Last a few minutes.  No associated headache, diplopia, slurred speech or motor deficits Also has noted some DOE when he goes to pick up the mail.  No lower extremity edema, no calf pain or swelling No headache, diplopia, slurred speech. Minimal swelling at the site of the operation. No difficulty  swallowing.  Past Medical History:  Diagnosis Date  . Carotid artery stenosis    right  . Chronic back pain    see surgeries   . GERD (gastroesophageal reflux disease)   . H/O: gout   . Hyperlipidemia   . Hypertension   . PPD positive    Positive PPD, remotely ~2000, s/p Rx    . Pre-diabetes   . Primary osteoarthritis of right hip    end stage  . Stroke [facility])    " mild"  . Wears  dentures     Past Surgical History:  Procedure Laterality Date  . BACK SURGERY     2007-2013 (low back)  . ENDARTERECTOMY Right 06/02/2019   Procedure: ENDARTERECTOMY CAROTID RIGHT;  Surgeon: Marty Heck, MD;  Location: [place];  Service: Vascular;  Laterality: Right;  . INTRAOPERATIVE ARTERIOGRAM  06/02/2019   Procedure: Right carotid and cerebral angiogram;  Surgeon: Marty Heck, MD;  Location: [place];  Service: Vascular;;  . MULTIPLE TOOTH EXTRACTIONS    . NECK SURGERY     2010  . OPERATIVE ULTRASOUND Right 06/02/2019   Procedure: Operative Ultrasound;  Surgeon: Marty Heck, MD;  Location: [place];  Service: Vascular;  Laterality: Right;    Social History   Socioeconomic History  . Marital status: Married    Spouse name: Not on file  . Number of children: 3  . Years of education: Not on file  . Highest education level: Not on file  Occupational History  . Occupation: city of [place]  Social Needs  . Financial resource strain: Not on file  . Food insecurity    Worry: Not on file    Inability: Not on file  . Transportation needs    Medical: Not on file    Non-medical: Not on file  Tobacco Use  . Smoking status: Former Smoker    Types: Cigarettes    Quit  date: 01/27/2010    Years since quitting: 9.3  . Smokeless tobacco: Current User    Types: Chew  Substance and Sexual Activity  . Alcohol use: Not Currently    Alcohol/week: 2.0 standard drinks    Types: 1 Glasses of wine, 1 Cans of beer per week  . Drug use: No    Comment: denies   . Sexual activity: Yes    Birth control/protection: None  Lifestyle  . Physical activity    Days per week: Not on file    Minutes per session: Not on file  . Stress: Not on file  Relationships  . Social Herbalist on phone: Not on file    Gets together: Not on file    Attends religious service: Not on file    Active member of club or organization: Not on file    Attends meetings of clubs or  organizations: Not on file    Relationship status: Not on file  . Intimate partner violence    Fear of current or ex partner: Not on file    Emotionally abused: Not on file    Physically abused: Not on file    Forced sexual activity: Not on file  Other Topics Concern  . Not on file  Social History Narrative   Lives w/ wife, 3 children plus adopted 3 children      Allergies as of 06/06/2019   No Known Allergies     Medication List       Accurate as of June 06, 2019  3:21 PM. If you have any questions, ask your nurse or doctor.        amLODipine 10 MG tablet Commonly known as: NORVASC Take 1 tablet (10 mg total) by mouth daily.   aspirin 81 MG EC tablet Take 1 tablet (81 mg total) by mouth daily.   atenolol 100 MG tablet Commonly known as: TENORMIN Take 0.5 tablets (50 mg total) by mouth daily.   atorvastatin 20 MG tablet Commonly known as: LIPITOR Take 1 tablet (20 mg total) by mouth at bedtime.   buprenorphine 8 MG Subl SL tablet Commonly known as: SUBUTEX Place 8 mg under the tongue 2 (two) times a day.   clopidogrel 75 MG tablet Commonly known as: PLAVIX Take 1 tablet (75 mg total) by mouth daily.   losartan 100 MG tablet Commonly known as: COZAAR Take 1 tablet (100 mg total) by mouth daily.   multivitamin with minerals Tabs tablet Take 1 tablet by mouth daily.   omeprazole 20 MG capsule Commonly known as: PRILOSEC Take 1 capsule (20 mg total) by mouth daily before breakfast.           Objective:   Physical Exam There were no vitals taken for this visit. This is a virtual visit, the patient is alert oriented x3, in no distress, speech is fluent, voice sound normal.  Face looks symmetric.    Assessment      Assessment Prediabetes A1c 6.1 2015 HTN Hyperlipidemia CV: --MRI- brain  02-2018: Remote infarct, remote left basal ganglia and corona radiator perforated infarct -- R endarterectomy 05/2019 GERD Elevated TSH 2014 H/o gout  DJD- hip  Chronic back pain + PPD 2000, s/p  antibiotics   PLAN S/P  R endarterectomy few days ago, seems to be feeling well. My only concern is the mild dizziness and DOE, new since the surgery.  His vital signs are stable, see next.  Post surgical BMP and CBC satisfactory.  We agreed on close observation, in the next few days if he develop increased dizziness or DOE or if he has chest pain or difficulty breathing needs to go to the ER.  If he is not progressing well he will call me next week. It is possible that he is slightly dizzy d/t  taking Subutex BID for pain control, rec to try to take 1 tablet daily & complement pain control with Tylenol. We will get BMP and CBC  in 4 to 5 days. HTN: Prior to being discharged from the hospital he was bradycardic and BP was low.  He was recommended to continue amlodipine, take atenolol half tablet and hold losartan. BP yesterday 162/88, 144/64.  Today 163/83.  Heart rate 55-60 Plan: Continue amlodipine 10 mg, atenolol have tablet and restart losartan 100 mg daily.  Monitor BPs, ranges discussed, if BP become low again,  he knows to decrease losartan to half tablet.  Labs next week.     I discussed the assessment and treatment plan with the patient. The patient was provided an opportunity to ask questions and all were answered. The patient agreed with the plan and demonstrated an understanding of the instructions.   The patient was advised to call back or seek an in-person evaluation if the symptoms worsen or if the condition fails to improve as anticipated.

## 2019-06-06 NOTE — Telephone Encounter (Signed)
Needs a TCM, hospital follow-up, please arrange

## 2019-06-06 NOTE — Telephone Encounter (Signed)
Transition Care Management Follow-up Telephone Call   Date discharged?06/04/19   How have you been since you were released from the hospital? Doing well   Do you understand why you were in the hospital? yes   Do you understand the discharge instructions? yes   Where were you discharged to? Home with wife   Items Reviewed:  Medications reviewed: yes. Pt reports hospital stopped losartan.  Allergies reviewed: yes  Dietary changes reviewed: yes  Referrals reviewed: yes   Functional Questionnaire:   Activities of Daily Living (ADLs):   He states they are independent in the following: ambulation, bathing and hygiene, feeding, continence, grooming, toileting and dressing States they require assistance with the following: n/a   Any transportation issues/concerns?: no    Any patient concerns? no   Confirmed importance and date/time of follow-up visits scheduled yes  Provider Appointment booked with PCP today at 320  Confirmed with patient if condition begins to worsen call PCP or go to the ER.  Patient was given the office number and encouraged to call back with question or concerns.  : yes

## 2019-06-08 NOTE — Assessment & Plan Note (Signed)
S/P  R endarterectomy few days ago, seems to be feeling well. My only concern is the mild dizziness and DOE, new since the surgery.  His vital signs are stable, see next.  Post surgical BMP and CBC satisfactory.  We agreed on close observation, in the next few days if he develop increased dizziness or DOE or if he has chest pain or difficulty breathing needs to go to the ER.  If he is not progressing well he will call me next week. It is possible that he is slightly dizzy d/t  taking Subutex BID for pain control, rec to try to take 1 tablet daily & complement pain control with Tylenol. We will get BMP and CBC  in 4 to 5 days. HTN: Prior to being discharged from the hospital he was bradycardic and BP was low.  He was recommended to continue amlodipine, take atenolol half tablet and hold losartan. BP yesterday 162/88, 144/64.  Today 163/83.  Heart rate 55-60 Plan: Continue amlodipine 10 mg, atenolol have tablet and restart losartan 100 mg daily.  Monitor BPs, ranges discussed, if BP become low again,  he knows to decrease losartan to half tablet.  Labs next week.

## 2019-06-12 ENCOUNTER — Ambulatory Visit: Payer: Self-pay | Admitting: *Deleted

## 2019-06-12 NOTE — Telephone Encounter (Signed)
Current BP medications: Amlodipine 10 mg, atenolol have tablet and  losartan 100 mg daily.  Call patient, LMOM: Hold the next dose of losartan I will call again in the morning If you develop symptoms: ER Continue checking BPs

## 2019-06-12 NOTE — Telephone Encounter (Signed)
Please advise 

## 2019-06-12 NOTE — Telephone Encounter (Signed)
Patient calling with concerns of BP being 98/56 at 9 am this morning.Pt states that before taking BP medication this am his BP was 121/78 around 6-6:30 am. taking BP. Pt states that her recently had an operation around a week ago and his BP is usually around 166/90 usually. Pt denies feeling lightheaded, dizzy or weak at this time. The following are BP readings from earlier this week: 6/21 149/75 at 7 am  127/58 at 12 pm   138/68 at 11 pm 06/10/19 138/88 at 7 am   137/71 at 1 pm  138/68 at 11 pm 06/11/19 124/72 at 10 am  128/63 at 4 pm   143/76 at 10 pm Pt can be contacted at [phone] with PCP recommendations.  Reason for Disposition . [6] Systolic BP 57-903 AND [8] taking blood pressure medications AND [3] NOT dizzy, lightheaded or weak  Answer Assessment - Initial Assessment Questions 1. BLOOD PRESSURE: "What is the blood pressure?" "Did you take at least two measurements 5 minutes apart?"     98/56  2. ONSET: "When did you take your blood pressure?"     9 am 3. HOW: "How did you obtain the blood pressure?" (e.g., visiting nurse, automatic home BP monitor)     Automatic monitor 4. HISTORY: "Do you have a history of low blood pressure?" "What is your blood pressure normally?"     No usually around 160s/90s 5. MEDICATIONS: "Are you taking any medications for blood pressure?" If yes: "Have they been changed recently?"     yes 6. PULSE RATE: "Do you know what your pulse rate is?"      BP not noted at the time of call 7. OTHER SYMPTOMS: "Have you been sick recently?" "Have you had a recent injury?"     Surgery about a week ago 8. PREGNANCY: "Is there any chance you are pregnant?" "When was your last menstrual period?"     N/a  Protocols used: LOW BLOOD PRESSURE-A-AH

## 2019-06-13 NOTE — Addendum Note (Signed)
Addended by: Kathlene November E on: 06/13/2019 09:08 AM   Modules accepted: Orders

## 2019-06-13 NOTE — Telephone Encounter (Signed)
Spoke with the patient. Yesterday in the morning he did have BP in the low side: 98/56. Since then he continue checking yesterday and today BPs have been very good around 124/65, 127/57. He reports today that he is taking: Amlodipine 10 mg daily Tenormin 100 mg: Half tablet daily Losartan 100 mg: Half tablet daily (I thought he was taking 1 tablet). Plan: Continue with present care, continue checking BPs, proceed with blood work which is pending. Call if BPs are consistently low or high.

## 2019-06-22 ENCOUNTER — Telehealth: Payer: Self-pay | Admitting: Internal Medicine

## 2019-06-22 DIAGNOSIS — E785 Hyperlipidemia, unspecified: Secondary | ICD-10-CM

## 2019-06-22 NOTE — Telephone Encounter (Addendum)
Overdue for FLP, BMP and CBC, orders already in, please arrange a lab visit

## 2019-06-26 ENCOUNTER — Other Ambulatory Visit: Payer: Self-pay

## 2019-06-26 ENCOUNTER — Other Ambulatory Visit (INDEPENDENT_AMBULATORY_CARE_PROVIDER_SITE_OTHER): Payer: 59

## 2019-06-26 DIAGNOSIS — I10 Essential (primary) hypertension: Secondary | ICD-10-CM | POA: Diagnosis not present

## 2019-06-26 DIAGNOSIS — E785 Hyperlipidemia, unspecified: Secondary | ICD-10-CM | POA: Diagnosis not present

## 2019-06-26 LAB — CBC WITH DIFFERENTIAL/PLATELET
Basophils Absolute: 0 10*3/uL (ref 0.0–0.1)
Basophils Relative: 0.7 % (ref 0.0–3.0)
Eosinophils Absolute: 0.3 10*3/uL (ref 0.0–0.7)
Eosinophils Relative: 4.4 % (ref 0.0–5.0)
HCT: 41.4 % (ref 39.0–52.0)
Hemoglobin: 13.9 g/dL (ref 13.0–17.0)
Lymphocytes Relative: 39.3 % (ref 12.0–46.0)
Lymphs Abs: 2.7 10*3/uL (ref 0.7–4.0)
MCHC: 33.6 g/dL (ref 30.0–36.0)
MCV: 86.8 fl (ref 78.0–100.0)
Monocytes Absolute: 0.6 10*3/uL (ref 0.1–1.0)
Monocytes Relative: 8.7 % (ref 3.0–12.0)
Neutro Abs: 3.3 10*3/uL (ref 1.4–7.7)
Neutrophils Relative %: 46.9 % (ref 43.0–77.0)
Platelets: 272 10*3/uL (ref 150.0–400.0)
RBC: 4.77 Mil/uL (ref 4.22–5.81)
RDW: 12.7 % (ref 11.5–15.5)
WBC: 6.9 10*3/uL (ref 4.0–10.5)

## 2019-06-26 LAB — BASIC METABOLIC PANEL
BUN: 10 mg/dL (ref 6–23)
CO2: 26 mEq/L (ref 19–32)
Calcium: 9.2 mg/dL (ref 8.4–10.5)
Chloride: 101 mEq/L (ref 96–112)
Creatinine, Ser: 0.94 mg/dL (ref 0.40–1.50)
GFR: 101.4 mL/min (ref >60.00)
Glucose, Bld: 162 mg/dL — ABNORMAL HIGH (ref 70–99)
Potassium: 4 mEq/L (ref 3.5–5.1)
Sodium: 136 mEq/L (ref 135–145)

## 2019-06-26 LAB — LIPID PANEL
Cholesterol: 128 mg/dL (ref 0–200)
HDL: 33.6 mg/dL — ABNORMAL LOW (ref >39.00)
NonHDL: 94.74
Total CHOL/HDL Ratio: 4
Triglycerides: 240 mg/dL — ABNORMAL HIGH (ref 0.0–149.0)
VLDL: 48 mg/dL — ABNORMAL HIGH (ref 0.0–40.0)

## 2019-06-26 LAB — LDL CHOLESTEROL, DIRECT: Direct LDL: 87 mg/dL

## 2019-07-21 ENCOUNTER — Telehealth (HOSPITAL_COMMUNITY): Payer: Self-pay | Admitting: Rehabilitation

## 2019-07-21 NOTE — Telephone Encounter (Signed)

## 2019-07-22 ENCOUNTER — Encounter: Payer: Self-pay | Admitting: *Deleted

## 2019-07-22 ENCOUNTER — Encounter: Payer: Self-pay | Admitting: Vascular Surgery

## 2019-07-22 ENCOUNTER — Ambulatory Visit (INDEPENDENT_AMBULATORY_CARE_PROVIDER_SITE_OTHER): Payer: Self-pay | Admitting: Vascular Surgery

## 2019-07-22 ENCOUNTER — Other Ambulatory Visit: Payer: Self-pay

## 2019-07-22 VITALS — BP 167/84 | HR 78 | Temp 97.8°F | Resp 14 | Ht 72.0 in | Wt 246.0 lb

## 2019-07-22 DIAGNOSIS — I6521 Occlusion and stenosis of right carotid artery: Secondary | ICD-10-CM

## 2019-07-22 DIAGNOSIS — M25511 Pain in right shoulder: Secondary | ICD-10-CM

## 2019-07-22 NOTE — Progress Notes (Signed)
Patient name: Mike Shepard MRN: [id number] DOB: 08-06-1966 Sex: male  REASON FOR VISIT: Postop check after right carotid endarterectomy  HPI: Mike Shepard is a 53 y.o. male that presents for postop check after right carotid endarterectomy on 06/02/2019.  On follow-up today he is overall doing well.  His neck incision is healed without issue.  He feels like his tongue facial muscles are working fine and swallowing is fine.  He's had no new vision or arm or leg deficits.  Otherwise is neurologically intact.  His only complaint today is that he is having some trouble abducting his right shoulder and is a heavy [employer].  He states this all started after surgery.  Past Medical History:  Diagnosis Date  . Carotid artery stenosis    right  . Chronic back pain    see surgeries   . GERD (gastroesophageal reflux disease)   . H/O: gout   . Hyperlipidemia   . Hypertension   . PPD positive    Positive PPD, remotely ~2000, s/p Rx    . Pre-diabetes   . Primary osteoarthritis of right hip    end stage  . Stroke [facility])    " mild"  . Wears dentures     Past Surgical History:  Procedure Laterality Date  . BACK SURGERY     2007-2013 (low back)  . ENDARTERECTOMY Right 06/02/2019   Procedure: ENDARTERECTOMY CAROTID RIGHT;  Surgeon: Marty Heck, MD;  Location: [place];  Service: Vascular;  Laterality: Right;  . INTRAOPERATIVE ARTERIOGRAM  06/02/2019   Procedure: Right carotid and cerebral angiogram;  Surgeon: Marty Heck, MD;  Location: [place];  Service: Vascular;;  . MULTIPLE TOOTH EXTRACTIONS    . NECK SURGERY     2010  . OPERATIVE ULTRASOUND Right 06/02/2019   Procedure: Operative Ultrasound;  Surgeon: Marty Heck, MD;  [facility];  Service: Vascular;  Laterality: Right;    Family History  Problem Relation Age of Onset  . Hypertension Mother        M,F,Sis, bro  . Diabetes Father        F, M. B  . Diabetes Sister   . Hypertension Sister    . Diabetes Brother   . Hypertension Brother   . Prostate cancer Paternal Grandfather        dx ~ 37 y/o  . Anesthesia problems Neg Hx   . Colon cancer Neg Hx   . CAD Neg Hx   . Stroke Neg Hx     SOCIAL HISTORY: Social History   Tobacco Use  . Smoking status: Former Smoker    Types: Cigarettes    Quit date: 01/27/2010    Years since quitting: 9.4  . Smokeless tobacco: Current User    Types: Chew  Substance Use Topics  . Alcohol use: Not Currently    Alcohol/week: 2.0 standard drinks    Types: 1 Glasses of wine, 1 Cans of beer per week    No Known Allergies  Current Outpatient Medications  Medication Sig Dispense Refill  . amLODipine (NORVASC) 10 MG tablet Take 1 tablet (10 mg total) by mouth daily. 90 tablet 1  . aspirin EC 81 MG EC tablet Take 1 tablet (81 mg total) by mouth daily.    Marland Kitchen atenolol (TENORMIN) 100 MG tablet Take 0.5 tablets (50 mg total) by mouth daily.    Marland Kitchen atorvastatin (LIPITOR) 20 MG tablet Take 1 tablet (20 mg total) by mouth at bedtime. 30 tablet  3  . buprenorphine (SUBUTEX) 8 MG SUBL SL tablet Place 8 mg under the tongue 2 (two) times a day.     . clopidogrel (PLAVIX) 75 MG tablet Take 1 tablet (75 mg total) by mouth daily. 30 tablet 6  . losartan (COZAAR) 100 MG tablet Take 0.5 tablets (50 mg total) by mouth daily.    . Multiple Vitamin (MULTIVITAMIN WITH MINERALS) TABS Take 1 tablet by mouth daily.    Marland Kitchen omeprazole (PRILOSEC) 20 MG capsule Take 1 capsule (20 mg total) by mouth daily before breakfast. 90 capsule 3   No current facility-administered medications for this visit.     REVIEW OF SYSTEMS:  [X]  denotes positive finding, [ ]  denotes negative finding Cardiac  Comments:  Chest pain or chest pressure:    Shortness of breath upon exertion:    Short of breath when lying flat:    Irregular heart rhythm:        Vascular    Pain in calf, thigh, or hip brought on by ambulation:    Pain in feet at night that wakes you up from your sleep:      Blood clot in your veins:    Leg swelling:         Pulmonary    Oxygen at home:    Productive cough:     Wheezing:         Neurologic    Sudden weakness in arms or legs:     Sudden numbness in arms or legs:     Sudden onset of difficulty speaking or slurred speech:    Temporary loss of vision in one eye:     Problems with dizziness:         Gastrointestinal    Blood in stool:     Vomited blood:         Genitourinary    Burning when urinating:     Blood in urine:        Psychiatric    Major depression:         Hematologic    Bleeding problems:    Problems with blood clotting too easily:        Skin    Rashes or ulcers:        Constitutional    Fever or chills:      PHYSICAL EXAM: Vitals:   07/22/19 1504 07/22/19 1505  BP:  (!) 167/84  Pulse:  78  Resp: 14   Temp: 97.8 F (36.6 C) 97.8 F (36.6 C)  TempSrc: Temporal Temporal  SpO2: 100%   Weight: 246 lb (111.6 kg)   Height: 6' (1.829 m)     GENERAL: The patient is a well-nourished male, in no acute distress. The vital signs are documented above. CARDIAC: There is a regular rate and rhythm.  VASCULAR:  Right neck incision well healed CN II-XII grossly intact Some pain with abducting right shoulder - no other deficits neurologically.  DATA:   None  Assessment/Plan:  53 year old male that presents status post right carotid endarterectomy.  Overall he seems to be doing very well.  His neck incision healed and neurologically feels intact.  He is having some trouble abducting his right shoulder and limited by pain.  States this started after carotid surgery.  I will refer him to outpatient physical therapy and see him back in a month to monitor his ongoing progress.  He needs to stay on the Plavix given that he had tandem intracranial lesions in addition to  his carotid lesion as noted on the operative report.   Marty Heck, MD [facility] Office: [phone]  Pager: [phone]

## 2019-08-01 ENCOUNTER — Other Ambulatory Visit: Payer: Self-pay

## 2019-08-01 ENCOUNTER — Ambulatory Visit: Payer: 59 | Attending: Vascular Surgery | Admitting: Physical Therapy

## 2019-08-01 ENCOUNTER — Encounter: Payer: Self-pay | Admitting: Physical Therapy

## 2019-08-01 DIAGNOSIS — M25511 Pain in right shoulder: Secondary | ICD-10-CM | POA: Insufficient documentation

## 2019-08-01 DIAGNOSIS — M25611 Stiffness of right shoulder, not elsewhere classified: Secondary | ICD-10-CM | POA: Insufficient documentation

## 2019-08-01 DIAGNOSIS — M6281 Muscle weakness (generalized): Secondary | ICD-10-CM | POA: Insufficient documentation

## 2019-08-01 NOTE — Patient Instructions (Signed)
   SHOULDER: External Rotation - Supine (Cane)   Hold cane with both hands. Rotate arm away from body. Keep elbow on floor and next to body. _10__ reps per set, __2_ sets per day twice a day, _6-7__ days per week Add towel to keep elbow at side. Hold cane with thumbs out not like in picture.  Copyright  VHI. All rights reserved.  Cane Horizontal - Supine   With straight arms holding cane above shoulders, bring cane out to right, center, out to left, and back to above head. Repeat _10 x 2 __ times. Do _2__ times per day.  Copyright  VHI. All rights reserved.  Cane Exercise: Flexion   Lie on back, holding cane above chest. Keeping arms as straight as possible, lower cane toward floor beyond head. Hold _5___ seconds. Repeat _10 x 2___ times. Do __2__ sessions per day.      AROM: Elbow Flexion / Extension  With left hand palm up, gently bend elbow as far as possible. Then straighten arm as far as possible. Repeat __10__ times per set. Do __3__ sessions per day.    Posture - Sitting   Sit upright, head facing forward. Try using a roll to support lower back. Keep shoulders relaxed, and avoid rounded back. Keep hips level with knees. Avoid crossing legs for long periods. Ribs lifted up . Golden thread to the sky.     Voncille Lo, PT Certified Exercise Expert for the Aging Adult  08/01/19 9:20 AM Phone: [phone] Fax: [phone]

## 2019-08-01 NOTE — Therapy (Signed)
[place], [place], [address] Phone: [phone]   Fax:  [phone]  Physical Therapy Evaluation  Patient Details  Name: Mike Shepard MRN: [id number] Date of Birth: 1966/03/26 Referring Provider (PT): Monica Martinez MD   Encounter Date: 08/01/2019  PT End of Session - 08/01/19 0853    Visit Number  1    Number of Visits  12    Date for PT Re-Evaluation  09/12/19    Authorization Type  [employer]    PT Start Time  5329       Past Medical History:  Diagnosis Date  . Carotid artery stenosis    right  . Chronic back pain    see surgeries   . GERD (gastroesophageal reflux disease)   . H/O: gout   . Hyperlipidemia   . Hypertension   . PPD positive    Positive PPD, remotely ~2000, s/p Rx    . Pre-diabetes   . Primary osteoarthritis of right hip    end stage  . Stroke [facility])    " mild"  . Wears dentures     Past Surgical History:  Procedure Laterality Date  . BACK SURGERY     2007-2013 (low back)  . ENDARTERECTOMY Right 06/02/2019   Procedure: ENDARTERECTOMY CAROTID RIGHT;  Surgeon: Marty Heck, MD;  Location: [place];  Service: Vascular;  Laterality: Right;  . INTRAOPERATIVE ARTERIOGRAM  06/02/2019   Procedure: Right carotid and cerebral angiogram;  Surgeon: Marty Heck, MD;  Location: [place];  Service: Vascular;;  . MULTIPLE TOOTH EXTRACTIONS    . NECK SURGERY     2010  . OPERATIVE ULTRASOUND Right 06/02/2019   Procedure: Operative Ultrasound;  Surgeon: Marty Heck, MD;  Location: [place];  Service: Vascular;  Laterality: Right;    There were no vitals filed for this visit.   Subjective Assessment - 08/01/19 0857    Subjective  53 yo ENDARTERECTOMY CAROTID RIGHT on June 02, 2019 and afterwards as I raise my right arm  I have pain and weakness.    Pertinent History  Have had LB surgery L-3/4 with Dr Vertell Limber as well as Cervical surgery > 10 years ago    Limitations   Lifting;Other (comment)   driving is affected. 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  Yes    Pain Score  6    when lifting greater than 8>90 degres   Pain Location  Shoulder    Pain Orientation  Right    Pain Descriptors / Indicators  Tightness;Pressure;Sharp    Pain Type  Acute pain;Neuropathic pain    Pain Onset  1 to 4 weeks ago   after June 02, 2019   Pain Frequency  Intermittent   depending on movement or lying on it   Aggravating Factors   lifting arm above 90, driving and turning wheel, lifting over shoulder, sleeping on right shoulder    Pain Relieving Factors  medication. and not moving it    Effect of Pain on Daily Activities  unable to work and do job because of weakness and pain         [facility] PT Assessment - 08/01/19 0937      Assessment  Medical Diagnosis  Right UE pain and weakness post Carotid Endarectomy    Referring Provider (PT)  Monica Martinez MD    Onset Date/Surgical Date  06/02/19    Hand Dominance  Right    Next MD Visit  Sept 8, 2020    Prior Therapy  NONE for UE      Precautions   Precautions  None      Restrictions   Weight Bearing Restrictions  No      Balance Screen   Has the patient fallen in the past 6 months  No    Has the patient had a decrease in activity level because of a fear of falling?   No    Is the patient reluctant to leave their home because of a fear of falling?   No      Home Environment   Living Environment  Private residence    Living Arrangements  Spouse/significant other;Children    Type of [place] to enter    Entrance Stairs-Number of Steps  0    Home Layout  One level      Prior Function   Level of Independence  Independent    Vocation  Full time employment    not working now   [employer]  heavy Engineer, structural for field operations       Cognition   Overall Cognitive Status  Within Functional Limits for tasks assessed      Observation/Other Assessments   Focus on Therapeutic Outcomes (FOTO)   FOTO 36%   limitation 64%  33% predicted       Sensation   Light Touch  Appears Intact    Stereognosis  Appears Intact      Posture/Postural Control   Posture/Postural Control  Postural limitations    Postural Limitations  Rounded Shoulders;Forward head      ROM / Strength   AROM / PROM / Strength  AROM;PROM;Strength      AROM   Overall AROM   Deficits    Right Shoulder Flexion  95 Degrees   pain   Right Shoulder ABduction  82 Degrees   pain   Right Shoulder Internal Rotation  50 Degrees   pain   Right Shoulder External Rotation  35 Degrees   pain   Left Shoulder Flexion  145 Degrees    Left Shoulder ABduction  148 Degrees    Left Shoulder Internal Rotation  65 Degrees    Left Shoulder External Rotation  90 Degrees      PROM   Overall PROM   Deficits    Right Shoulder Flexion  140 Degrees   pain   Right Shoulder ABduction  125 Degrees   pain   Right Shoulder Internal Rotation  60 Degrees   pain    Right Shoulder External Rotation  40 Degrees   pain   Left Shoulder Flexion  154 Degrees    Left Shoulder ABduction  159 Degrees    Left Shoulder Internal Rotation  95 Degrees    Left Shoulder External Rotation  70 Degrees      Strength   Overall Strength  Deficits    Right Shoulder Flexion  3-/5    Right Shoulder ABduction  3-/5    Right Shoulder Internal Rotation  3-/5    Right Shoulder External Rotation  3-/5    Left Shoulder Flexion  5/5    Left Shoulder ABduction  5/5  Left Shoulder Internal Rotation  5/5    Left Shoulder External Rotation  5/5      Palpation   Palpation comment  upper trap tightness no TTP                Objective measurements completed on examination: See above findings.       [facility] Adult PT Treatment/Exercise - 08/01/19 0937      Self-Care   Self-Care  Posture    Posture  iniital posture education      Shoulder Exercises: Supine   Other Supine Exercises  supine cane exericses 10 reps of bil flexion with dowel, horizontal abduction x 10 and ER with dowel x 10,        Shoulder Exercises: Seated   Other Seated Exercises  elbow extention x 10 with end range stretch      Shoulder Exercises: Standing   Other Standing Exercises  wall clock with towel on wall  4 minutes             PT Education - 08/01/19 0928    Education Details  POC, Explanation of findings, HEP for shoulder ROM, initial posture    Person(s) Educated  Patient    Methods  Explanation;Demonstration;Tactile cues;Verbal cues;Handout    Comprehension  Verbalized understanding;Returned demonstration       PT Short Term Goals - 08/01/19 0932      PT SHORT TERM GOAL #1   Title  Pt will be able to raise R UE to 90 degrees or higher without exacerbating pain greater than 3/10    Baseline  Pt unable to raise right arm above 90 degrees at eval    Time  2    Period  Weeks    Status  New    Target Date  08/15/19      PT SHORT TERM GOAL #2   Title  Be independent with initial HEP    Baseline  no knowledge    Time  2    Period  Weeks    Status  New    Target Date  08/15/19      PT SHORT TERM GOAL #3   Title  Report being able to sleep 50% better due to pain, comfort    Baseline  Pt not able to sleep on Right side at night,    Time  2    Period  Weeks    Status  New    Target Date  08/15/19        PT Long Term Goals - 08/01/19 0902      PT LONG TERM GOAL #1   Title  Pt will be independent with advanced HEP.    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    Baseline  Pt unable to work now    Time  6    Period  Weeks    Status  New    Target Date  09/12/19      PT LONG TERM GOAL #6   Title  R shoulder IR and ER will return to [facility] to return to pain-free ADLs such as dressing and grooming.    Baseline  Pt has pain with donning and doffing clothes and bathing    Time  6    Period  Weeks    Status  New    Target Date  09/12/19             Plan - 08/01/19 0944    Clinical Impression Statement  53 yo male post Right Carotid Endarectomy on june 15,2020 and had muscle weakness and end range pain post procedure.  Pt is able to have AROM right flex 95 and abd 82.  Pt with probable neuropraxis post surgery and had already improved with simple exericse and stretching during evaluation.   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  Pt will benefit from 2x a week for 6 weeks.    Personal Factors and Comorbidities  Comorbidity 1;Profession   HBP   Comorbidities  HBP/ heavy equipment operator    Examination-Activity Limitations  Bathing;Dressing;Lift;Hygiene/Grooming;Reach  Overhead   difficulty with dressing/ bathing   Examination-Participation Restrictions  Driving;Other   work   PT Frequency  2x / week    PT Duration  6 weeks    PT Treatment/Interventions  ADLs/Self Care Home Management;Cryotherapy;Electrical Stimulation;Iontophoresis 4mg /ml Dexamethasone;Moist Heat;Ultrasound;Neuromuscular re-education;Therapeutic exercise;Therapeutic activities;Manual techniques;Dry needling;Passive range of motion;Joint Manipulations    PT Next Visit Plan  Review HEP and add strengthening ,add pectoral stretching    PT Home Exercise Plan  supine cane exercises, elbow extension and wall clock    Consulted and Agree with Plan of Care  Patient       Patient will benefit from skilled therapeutic intervention in order to improve the following deficits and impairments:  Pain, Impaired UE functional use, Improper body mechanics, Postural dysfunction, Impaired flexibility, Increased fascial restricitons, Decreased strength, Decreased range of motion  Visit Diagnosis: 1. Muscle weakness (generalized)   2. Stiffness of right shoulder, not elsewhere classified   3. Right shoulder pain, unspecified chronicity        Problem List Patient Active Problem List   Diagnosis Date Noted  . Asymptomatic stenosis of right carotid artery without infarction 06/02/2019  . Carotid stenosis 05/20/2019  . H/O: CVA  per MRI 05/14/2018  . DJD (degenerative joint disease) 04/21/2016  . PCP NOTES >>>>> 11/06/2015  . Hyperglycemia 12/28/2013  . Abnormal TSH 12/28/2013  . Annual physical exam 07/19/2012  . Back pain 07/19/2012  . GOUT 11/15/2010  . Hyperlipidemia 08/29/2007  . Essential hypertension 06/27/2007  . GERD 06/27/2007    Voncille Lo, PT Certified Exercise Expert for the Aging Adult  08/01/19 12:45 PM Phone: [phone] Fax: [facility] [address] [place], [place], [address] Phone: [phone]    Fax:  [phone]  Name: Mike Shepard MRN: [id number] Date of Birth: [birth year]

## 2019-08-05 ENCOUNTER — Other Ambulatory Visit: Payer: Self-pay

## 2019-08-05 ENCOUNTER — Ambulatory Visit: Payer: 59 | Admitting: Physical Therapy

## 2019-08-05 ENCOUNTER — Encounter: Payer: Self-pay | Admitting: Physical Therapy

## 2019-08-05 DIAGNOSIS — M25511 Pain in right shoulder: Secondary | ICD-10-CM

## 2019-08-05 DIAGNOSIS — M6281 Muscle weakness (generalized): Secondary | ICD-10-CM | POA: Diagnosis not present

## 2019-08-05 DIAGNOSIS — M25611 Stiffness of right shoulder, not elsewhere classified: Secondary | ICD-10-CM

## 2019-08-05 NOTE — Patient Instructions (Signed)
          Voncille Lo, PT Certified Exercise Expert for the Aging Adult  08/05/19 9:03 AM Phone: [phone] Fax: [phone]

## 2019-08-05 NOTE — Therapy (Signed)
[place], [place], [address] Phone: [phone]   Fax:  [phone]  Physical Therapy Treatment  Patient Details  Name: Mike Shepard MRN: [id number] Date of Birth: 1966/05/20 Referring Provider (PT): Monica Martinez MD   Encounter Date: 08/05/2019  PT End of Session - 08/05/19 0906    Visit Number  2    Number of Visits  12    Date for PT Re-Evaluation  09/12/19    Authorization Type  UHC    PT Start Time  5498    PT Stop Time  0925    PT Time Calculation (min)  38 min    Activity Tolerance  Patient tolerated treatment well    Behavior During Therapy  [facility] for tasks assessed/performed       Past Medical History:  Diagnosis Date  . Carotid artery stenosis    right  . Chronic back pain    see surgeries   . GERD (gastroesophageal reflux disease)   . H/O: gout   . Hyperlipidemia   . Hypertension   . PPD positive    Positive PPD, remotely ~2000, s/p Rx    . Pre-diabetes   . Primary osteoarthritis of right hip    end stage  . Stroke [facility])    " mild"  . Wears dentures     Past Surgical History:  Procedure Laterality Date  . BACK SURGERY     2007-2013 (low back)  . ENDARTERECTOMY Right 06/02/2019   Procedure: ENDARTERECTOMY CAROTID RIGHT;  Surgeon: Marty Heck, MD;  Location: [place];  Service: Vascular;  Laterality: Right;  . INTRAOPERATIVE ARTERIOGRAM  06/02/2019   Procedure: Right carotid and cerebral angiogram;  Surgeon: Marty Heck, MD;  Location: [place];  Service: Vascular;;  . MULTIPLE TOOTH EXTRACTIONS    . NECK SURGERY     2010  . OPERATIVE ULTRASOUND Right 06/02/2019   Procedure: Operative Ultrasound;  Surgeon: Marty Heck, MD;  Location: [place];  Service: Vascular;  Laterality: Right;    There were no vitals filed for this visit.  Subjective Assessment - 08/05/19 0850    Subjective  Stretching really is helping right now.  I am 5/10 pain right  now.  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  Yes    Pain Score  5     Pain Location  Shoulder    Pain Orientation  Right    Pain Descriptors / Indicators  Tightness;Aching    Pain Onset  More than a month ago    Pain Frequency  Intermittent         [facility] PT Assessment - 08/05/19 0853      AROM   Right Shoulder Flexion  110 Degrees                   [facility] Adult PT Treatment/Exercise - 08/05/19 0904      Shoulder Exercises: Supine   Other Supine Exercises  supine cane exericses 10 reps x 2 of bil flexion with dowel, horizontal abduction x 10 and ER with dowel x 10,     flex with 3 lb weight     Shoulder Exercises: Seated   Other Seated Exercises  elbow extention x 10 with end range stretch   added 3 lb wt. x 2     Shoulder Exercises: Standing   Other Standing Exercises  wall clock with towel on wall  4 minutes    Other Standing Exercises  pectoral doorway stretch 3 x 30 second on Right      Shoulder Exercises: Stretch   Other Shoulder Stretches  doorway r UE pectoral stretch 30 sec x 3    Other Shoulder Stretches  median, radial, and ulnar R nerve flossing 10 x each nerve       Manual Therapy   Manual Therapy  Soft tissue mobilization    Soft tissue mobilization  RU shoulder all planes, overpressure and stretch             PT Education - 08/05/19 0903    Education Details  Added to HEP UE neural glides for radial, medial and ulnar nerves as well as pec stretching    Person(s) Educated  Patient    Methods  Explanation;Demonstration;Tactile  cues;Verbal cues;Handout    Comprehension  Verbalized understanding;Returned demonstration       PT Short Term Goals - 08/05/19 0909      PT SHORT TERM GOAL #1   Title  Pt will be able to raise R UE to 90 degrees or higher without exacerbating pain greater than 3/10    Baseline  Pt able to raise to 110 degrees but with 4/5 out of 10 pain    Time  2    Period  Weeks    Status  On-going      PT SHORT TERM GOAL #2   Title  Be independent with initial HEP    Baseline  Pt given intiial HEP    Time  2    Period  Weeks    Status  On-going      PT SHORT TERM GOAL #3   Title  Report being able to sleep 50% better due to pain, comfort    Baseline  Pt not able to sleep on Right side at night, Pt wakes up every 2 -3 hours    Time  2    Period  Weeks    Status  On-going        PT Long Term Goals - 08/01/19 0902      PT LONG TERM GOAL #1   Title  Pt will be independent with advanced HEP.    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     Baseline  Pt unable to work now    Time  6    Period  Weeks    Status  New    Target Date  09/12/19      PT LONG TERM GOAL #6   Title  R shoulder IR and ER will return to [facility] to return to pain-free ADLs such as dressing and grooming.    Baseline  Pt has pain with donning and doffing clothes and bathing    Time  6    Period  Weeks    Status  New    Target Date  09/12/19            Plan - 08/05/19 0907    Clinical Impression Statement  Pt pain level at beginning was 5/10  reduced to 4/10 with RX.  Pt able to raise /flex shoulder Right to 110 improved over 95 degrees last visit.  Pt expresses he is glad he feels he is going into the right direction with healing. Pt is well motivated to achieve goals    Personal Factors and Comorbidities  Comorbidity 1;Profession    Comorbidities  HBP/ heavy equipment operator    Examination-Activity Limitations  Bathing;Dressing;Lift;Hygiene/Grooming;Reach Overhead    Examination-Participation Restrictions  Driving;Other    PT Frequency  2x / week    PT Duration  6 weeks    PT Treatment/Interventions  ADLs/Self Care Home Management;Cryotherapy;Electrical Stimulation;Iontophoresis 4mg /ml Dexamethasone;Moist Heat;Ultrasound;Neuromuscular re-education;Therapeutic exercise;Therapeutic activities;Manual techniques;Dry needling;Passive range of motion;Joint Manipulations    PT Next Visit Plan  Do sub scapular stabilizers with green t band/add resistance. progress with resistance and check goals. pulleys as needed    PT Home Exercise Plan  supine cane exercises, elbow extension and wall clockUE neural flossing.    Consulted and Agree with Plan of Care  Patient       Patient will benefit from skilled therapeutic intervention in order to improve the following deficits and impairments:  Pain, Impaired UE functional use, Improper body mechanics, Postural dysfunction, Impaired flexibility, Increased fascial restricitons, Decreased strength, Decreased range of  motion  Visit Diagnosis: 1. Muscle weakness (generalized)   2. Stiffness of right shoulder, not elsewhere classified   3. Right shoulder pain, unspecified chronicity        Problem List Patient Active Problem List   Diagnosis Date Noted  . Asymptomatic stenosis of right carotid artery without infarction 06/02/2019  . Carotid stenosis 05/20/2019  . H/O: CVA  per MRI 05/14/2018  . DJD (degenerative joint disease) 04/21/2016  . PCP NOTES >>>>> 11/06/2015  . Hyperglycemia 12/28/2013  . Abnormal TSH 12/28/2013  . Annual physical exam 07/19/2012  . Back pain 07/19/2012  . GOUT  11/15/2010  . Hyperlipidemia 08/29/2007  . Essential hypertension 06/27/2007  . GERD 06/27/2007   Voncille Lo, PT Certified Exercise Expert for the Aging Adult  08/05/19 9:29 AM Phone: [phone] Fax: [facility] [address] [place], [place], [address] Phone: [phone]   Fax:  [phone]  Name: Mike Shepard MRN: [id number] Date of Birth: 12/13/1966

## 2019-08-12 ENCOUNTER — Ambulatory Visit: Payer: 59 | Admitting: Physical Therapy

## 2019-08-12 ENCOUNTER — Other Ambulatory Visit: Payer: Self-pay

## 2019-08-12 ENCOUNTER — Encounter: Payer: Self-pay | Admitting: Physical Therapy

## 2019-08-12 DIAGNOSIS — M25511 Pain in right shoulder: Secondary | ICD-10-CM

## 2019-08-12 DIAGNOSIS — M6281 Muscle weakness (generalized): Secondary | ICD-10-CM | POA: Diagnosis not present

## 2019-08-12 DIAGNOSIS — M25611 Stiffness of right shoulder, not elsewhere classified: Secondary | ICD-10-CM

## 2019-08-12 NOTE — Therapy (Signed)
[place], [place], [address] Phone: [phone]   Fax:  [phone]  Physical Therapy Treatment  Patient Details  Name: Mike Shepard MRN: [id number] Date of Birth: Dec 02, 1966 Referring Provider (PT): Monica Martinez MD   Encounter Date: 08/12/2019  PT End of Session - 08/12/19 0846    Visit Number  3    Number of Visits  12    Date for PT Re-Evaluation  09/12/19    Authorization Type  U[employer]    PT Start Time  0847    PT Stop Time  0930    PT Time Calculation (min)  43 min    Activity Tolerance  Patient tolerated treatment well    Behavior During Therapy  [facility] for tasks assessed/performed       Past Medical History:  Diagnosis Date  . Carotid artery stenosis    right  . Chronic back pain    see surgeries   . GERD (gastroesophageal reflux disease)   . H/O: gout   . Hyperlipidemia   . Hypertension   . PPD positive    Positive PPD, remotely ~2000, s/p Rx    . Pre-diabetes   . Primary osteoarthritis of right hip    end stage  . Stroke [facility])    " mild"  . Wears dentures     Past Surgical History:  Procedure Laterality Date  . BACK SURGERY     2007-2013 (low back)  . ENDARTERECTOMY Right 06/02/2019   Procedure: ENDARTERECTOMY CAROTID RIGHT;  Surgeon: Marty Heck, MD;  Location: [place];  Service: Vascular;  Laterality: Right;  . INTRAOPERATIVE ARTERIOGRAM  06/02/2019   Procedure: Right carotid and cerebral angiogram;  Surgeon: Marty Heck, MD;  Location: [place];  Service: Vascular;;  . MULTIPLE TOOTH EXTRACTIONS    . NECK SURGERY     2010  . OPERATIVE ULTRASOUND Right 06/02/2019   Procedure: Operative Ultrasound;  Surgeon: Marty Heck, MD;  Location: [place];  Service: Vascular;  Laterality: Right;    There were no vitals filed for this visit.  Subjective Assessment - 08/12/19 0853    Subjective  I am a 3/10 every day . I know I am getting better all the time.   No pain at night time.  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  Yes    Pain Score  3     Pain Location  Shoulder    Pain Orientation  Right    Pain Descriptors / Indicators  Tightness;Aching    Pain Type  Neuropathic pain;Acute pain    Pain Onset  More than a month ago    Pain Frequency  Intermittent         [facility] PT Assessment - 08/12/19 0001      AROM   Right Shoulder Flexion  115 Degrees                   [facility] Adult PT Treatment/Exercise - 08/12/19 0915      Shoulder Exercises: Prone  Retraction  --   x 2   Other Prone Exercises   I, Y T and W bil 15 x 2        Shoulder Exercises: Standing   External Rotation  15 reps    x 2 bil   Theraband Level (Shoulder External Rotation)  Level 3 (Green)    Flexion  Strengthening;15 reps   x 2   Theraband Level (Shoulder Flexion)  Level 3 (Green)    ABduction  Strengthening;15 reps;Right    Theraband Level (Shoulder ABduction)  Level 3 (Green)    Haematologist;Theraband;15 reps;Both    x 2   Theraband Level (Shoulder Extension)  Level 3 (Green)    Other Standing Exercises  wall clock with towel on wall  4 minutes    Other Standing Exercises  used 5 lb KB for snatch and press above head in standing       Neck Exercises: Stretches   Upper Trapezius Stretch  3 reps;Right;30 seconds    Levator Stretch  3 reps;Right;30 seconds           added to HEP Access Code: [email or web address]  URL: [email or web address]  Date: 08/12/2019  Prepared by: Voncille Lo   Exercises  Standing Shoulder Flexion with Resistance - 15 reps - 1-2 sets -  1x daily - 7x weekly  Standing Single Arm Shoulder Abduction with Resistance - 15 reps - 1-2 sets - 1x daily - 7x weekly  Shoulder extension with resistance - Neutral - 15 reps - 1-2 sets - 1x daily - 7x weekly  Shoulder External Rotation and Scapular Retraction with Resistance - 15 reps - 1-2 sets - 2x daily - 7x weekly  Prone Scapular Slide with Shoulder Extension - 15 reps - 1 sets - 1x daily - 7x weekly  Prone Scapular Retraction Arms at Side - 15 reps - 1 sets - 1x daily - 7x weekly  Prone Scapular Retraction in Abduction - 15 reps - 1 sets - 1x daily - 7x weekly  Prone Single Arm Shoulder Y - 15 reps - 1 sets - 1x daily - 7x weekly    PT Education - 08/12/19 0908    Education Details  progressed to strengthening HEP with handouts for standing sub scapular and prone strength  Verbally gave levator stretch/ trap stretch    Person(s) Educated  Patient    Methods  Explanation;Demonstration;Tactile cues;Verbal cues;Handout    Comprehension  Verbalized understanding;Returned demonstration       PT Short Term Goals - 08/12/19 0926      PT SHORT TERM GOAL #1   Title  Pt will be able to raise R UE to 90 degrees or higher without exacerbating pain greater than 3/10    Baseline  Pt able to raise to 115 degrees but with 3/10 out of 10 pain    Time  2    Period  Weeks    Status  Achieved    Target Date  08/15/19      PT SHORT TERM GOAL #2   Title  Be independent with initial HEP    Baseline  Pt given intiial HEP and return demo    Time  2    Period  Weeks    Status  Achieved    Target Date  08/15/19      PT SHORT TERM GOAL #3   Title  Report being able to sleep 50% better due to pain, comfort    Baseline  Pt able to sleep  on right side at night without waking    Time  2    Period  Weeks    Status  Achieved        PT Long Term Goals - 08/01/19 0902      PT LONG TERM GOAL #1   Title  Pt will be independent with advanced HEP.    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    Baseline  Pt unable to work now    Time  6    Period  Weeks    Status  New    Target Date  09/12/19      PT LONG TERM GOAL #6   Title  R shoulder IR and ER will return to [facility] to return to pain-free ADLs such as dressing and grooming.    Baseline  Pt has pain with donning and doffing clothes and bathing    Time  6    Period  Weeks    Status  New    Target Date  09/12/19            Plan - 08/12/19 [id number]    Clinical Impression Statement  Pt able to sleep at night on right side.  Pt enters clinic at 3/10 pain level and able to AROM shoulder flexion at 115.  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    Personal Factors and Comorbidities  Comorbidity 1;Profession    Comorbidities  HBP/ heavy equipment operator    Examination-Activity Limitations  Bathing;Dressing;Lift;Hygiene/Grooming;Reach Overhead    Examination-Participation Restrictions   Driving;Other    Rehab Potential  Good    PT Frequency  2x / week    PT Duration  6 weeks    PT Treatment/Interventions  ADLs/Self Care Home Management;Cryotherapy;Electrical Stimulation;Iontophoresis 4mg /ml Dexamethasone;Moist Heat;Ultrasound;Neuromuscular re-education;Therapeutic exercise;Therapeutic activities;Manual techniques;Dry needling;Passive range of motion;Joint Manipulations  PT Next Visit Plan  check Reinforce HEP from last visit.  Pt needs to progress with KB and heavy lifting up to 45 # progress as able . Scar tissue massage of neck as able to help with tightness    PT Home Exercise Plan  supine cane exercises, elbow extension and wall clockUE neural flossing. prone exercises and standing sub scapular    Consulted and Agree with Plan of Care  Patient       Patient will benefit from skilled therapeutic intervention in order to improve the following deficits and impairments:  Pain, Impaired UE functional use, Improper body mechanics, Postural dysfunction, Impaired flexibility, Increased fascial restricitons, Decreased strength, Decreased range of motion  Visit Diagnosis: Muscle weakness (generalized)  Stiffness of right shoulder, not elsewhere classified  Right shoulder pain, unspecified chronicity     Problem List Patient Active Problem List   Diagnosis Date Noted  . Asymptomatic stenosis of right carotid artery without infarction 06/02/2019  . Carotid stenosis 05/20/2019  . H/O: CVA  per MRI 05/14/2018  . DJD (degenerative joint disease) 04/21/2016  . PCP NOTES >>>>> 11/06/2015  . Hyperglycemia 12/28/2013  . Abnormal TSH 12/28/2013  . Annual physical exam 07/19/2012  . Back pain 07/19/2012  . GOUT 11/15/2010  . Hyperlipidemia 08/29/2007  . Essential hypertension 06/27/2007  . GERD 06/27/2007    Voncille Lo, PT Certified Exercise Expert for the Aging Adult  08/12/19 9:33 AM Phone: [phone] Fax: [phone]  [facility] [address] [place], [place], [address] Phone: [phone]   Fax:  [phone]  Name: RODRIQUES THEROUX MRN: [id number] Date of Birth: [birth year]

## 2019-08-12 NOTE — Patient Instructions (Signed)
                Voncille Lo, PT Certified Exercise Expert for the Aging Adult  08/12/19 9:06 AM Phone: [phone] Fax: [phone]

## 2019-08-14 ENCOUNTER — Ambulatory Visit: Payer: 59 | Admitting: Physical Therapy

## 2019-08-19 ENCOUNTER — Ambulatory Visit: Payer: 59 | Attending: Vascular Surgery | Admitting: Physical Therapy

## 2019-08-19 ENCOUNTER — Encounter: Payer: Self-pay | Admitting: Physical Therapy

## 2019-08-19 ENCOUNTER — Other Ambulatory Visit: Payer: Self-pay

## 2019-08-19 DIAGNOSIS — M6281 Muscle weakness (generalized): Secondary | ICD-10-CM | POA: Insufficient documentation

## 2019-08-19 DIAGNOSIS — M25511 Pain in right shoulder: Secondary | ICD-10-CM | POA: Diagnosis present

## 2019-08-19 DIAGNOSIS — M25611 Stiffness of right shoulder, not elsewhere classified: Secondary | ICD-10-CM | POA: Insufficient documentation

## 2019-08-19 NOTE — Patient Instructions (Signed)
Trigger Point Dry Needling  . What is Trigger Point Dry Needling (DN)? o DN is a physical therapy technique used to treat muscle pain and dysfunction. Specifically, DN helps deactivate muscle trigger points (muscle knots).  o A thin filiform needle is used to penetrate the skin and stimulate the underlying trigger point. The goal is for a local twitch response (LTR) to occur and for the trigger point to relax. No medication of any kind is injected during the procedure.   . What Does Trigger Point Dry Needling Feel Like?  o The procedure feels different for each individual patient. Some patients report that they do not actually feel the needle enter the skin and overall the process is not painful. Very mild bleeding may occur. However, many patients feel a deep cramping in the muscle in which the needle was inserted. This is the local twitch response.   Marland Kitchen How Will I feel after the treatment? o Soreness is normal, and the onset of soreness may not occur for a few hours. Typically this soreness does not last longer than two days.  o Bruising is uncommon, however; ice can be used to decrease any possible bruising.  o In rare cases feeling tired or nauseous after the treatment is normal. In addition, your symptoms may get worse before they get better, this period will typically not last longer than 24 hours.   . What Can I do After My Treatment? o Increase your hydration by drinking more water for the next 24 hours. o You may place ice or heat on the areas treated that have become sore, however, do not use heat on inflamed or bruised areas. Heat often brings more relief post needling. o You can continue your regular activities, but vigorous activity is not recommended initially after the treatment for 24 hours. o DN is best combined with other physical therapy such as strengthening, stretching, and other therapies.   Levator Stretch   Grasp seat or sit on hand on side to be stretched. Turn head  toward other side and look down. Use hand on head to gently stretch neck in that position. Hold _30___ seconds. Repeat on other side. Repeat __3__ times. Do __2-3__ sessions per day.  http://gt2.exer.us/30   Copyright  VHI. All rights reserved.  Side-Bending   One hand on opposite side of head, pull head to side as far as is comfortable. Stop if there is pain. Hold _20-30___ seconds. Repeat with other hand to other side. Repeat _3___ times. Do _2-3___ sessions per day.   Copyright  VHI. All rights reserved.  Scapular Retraction (Standing)   With arms at sides, pinch shoulder blades together. Repeat _10___ times per set. Do __2__ sets per session. Do __1-2__ sessions per day.  http://orth.exer.us/944   Copyright  VHI. All rights reserved.  Chin Protraction / Retraction   Slide head forward keeping chin level. Slide head back, pulling chin in. Hold each position  10___ seconds. Repeat __5_ times. Do _1-2__ sessions per day.  Copyright  VHI. All rights reserved.    Mike Shepard, PT Certified Exercise Expert for the Aging Adult  08/19/19 8:20 AM Phone: [phone] Fax: [phone]

## 2019-08-19 NOTE — Therapy (Signed)
[place], [place], [address] Phone: [phone]   Fax:  [phone]  Physical Therapy Treatment  Patient Details  Name: Mike Shepard MRN: [id number] Date of Birth: [birth year] Referring Provider (PT): Monica Martinez MD   Encounter Date: 08/19/2019  PT End of Session - 08/19/19 0807    Visit Number  4    Number of Visits  12    Date for PT Re-Evaluation  09/12/19    Authorization Type  UHC    PT Start Time  0800    PT Stop Time  0843    PT Time Calculation (min)  43 min    Activity Tolerance  Patient tolerated treatment well    Behavior During Therapy  [facility] for tasks assessed/performed       Past Medical History:  Diagnosis Date  . Carotid artery stenosis    right  . Chronic back pain    see surgeries   . GERD (gastroesophageal reflux disease)   . H/O: gout   . Hyperlipidemia   . Hypertension   . PPD positive    Positive PPD, remotely ~2000, s/p Rx    . Pre-diabetes   . Primary osteoarthritis of right hip    end stage  . Stroke [facility])    " mild"  . Wears dentures     Past Surgical History:  Procedure Laterality Date  . BACK SURGERY     2007-2013 (low back)  . ENDARTERECTOMY Right 06/02/2019   Procedure: ENDARTERECTOMY CAROTID RIGHT;  Surgeon: Marty Heck, MD;  Location: [place];  Service: Vascular;  Laterality: Right;  . INTRAOPERATIVE ARTERIOGRAM  06/02/2019   Procedure: Right carotid and cerebral angiogram;  Surgeon: Marty Heck, MD;  Location: [place];  Service: Vascular;;  . MULTIPLE TOOTH EXTRACTIONS    . NECK SURGERY     2010  . OPERATIVE ULTRASOUND Right 06/02/2019   Procedure: Operative Ultrasound;  Surgeon: Marty Heck, MD;  Location: [place];  Service: Vascular;  Laterality: Right;    There were no vitals filed for this visit.  Subjective Assessment - 08/19/19 0810    Subjective  I wasnt able to come last visit, because fell and twisted ankle.   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  Yes    Pain Score  3     Pain Location  Shoulder    Pain Orientation  Right    Pain Descriptors / Indicators  Sharp;Sore    Pain Type  Neuropathic pain    Pain Onset  More than a month ago    Pain Frequency  Intermittent    Aggravating Factors   lifint over head    Pain Relieving Factors  medication , exercising         [facility] PT Assessment - 08/19/19 0832      AROM   Right Shoulder Flexion  135 Degrees    Right Shoulder ABduction  119 Degrees    Right Shoulder Internal Rotation  60 Degrees  Right Shoulder External Rotation  90 Degrees                   [facility] Adult PT Treatment/Exercise - 08/19/19 0845      Self-Care   Self-Care  Other Self-Care Comments    Other Self-Care Comments   education on TPDN and aftercare      Shoulder Exercises: Prone   Other Prone Exercises   I, Y T and W bil 15 x 2        Shoulder Exercises: Pulleys   Other Pulley Exercises  FREE Motion,  RT side only extension with 17 #  diagonals D! and D2 wit 10 lb  , 15 KB walkout  with visible co contraction,  down graded to 10 lb  and able to do 20 X walkout       Manual Therapy   Manual Therapy  Soft tissue mobilization    Manual therapy comments  skilled palpation with TPDN    Soft tissue mobilization  upper trap and levator , PROM for shoulder in all ranges      Neck Exercises: Stretches   Upper Trapezius Stretch  3 reps;Right;30 seconds    Levator Stretch  3 reps;Right;30 seconds    Other Neck Stretches  chin tuck 5 x 10 sec hold    Other Neck Stretches  scapular  retraction bil x 10       Trigger Point Dry Needling - 08/19/19 0824    Consent Given?  Yes    Education Handout Provided  Yes    Muscles Treated Head and Neck  Upper trapezius;Levator scapulae   RT only   Dry Needling Comments  skilled palpation with TPDN    Upper Trapezius Response  Twitch reponse elicited;Palpable increased muscle length    Levator Scapulae Response  Twitch response elicited;Palpable increased muscle length           PT Education - 08/19/19 0823    Education Details  Added neck stretches,  TPDN education.  reviewed HEP    Person(s) Educated  Patient    Methods  Explanation;Demonstration;Tactile cues;Verbal cues;Handout    Comprehension  Verbalized understanding;Returned demonstration       PT Short Term Goals - 08/12/19 0926      PT SHORT TERM GOAL #1   Title  Pt will be able to raise R UE to 90 degrees or higher without exacerbating pain greater than 3/10    Baseline  Pt able to raise to 115 degrees but with 3/10 out of 10 pain    Time  2    Period  Weeks    Status  Achieved    Target Date  08/15/19      PT SHORT TERM GOAL #2   Title  Be independent with initial HEP    Baseline  Pt given intiial HEP and return demo    Time  2    Period  Weeks    Status  Achieved    Target Date  08/15/19      PT SHORT TERM GOAL #3   Title  Report being able to sleep 50% better due to pain, comfort    Baseline  Pt able to sleep on right side at night without waking    Time  2    Period  Weeks    Status  Achieved        PT Long Term Goals - 08/19/19 0815      PT LONG TERM GOAL #1  Title  Pt will be independent with advanced HEP.    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    Baseline  Pt unable to work  at job now    Time  6    Period  Weeks    Status  On-going    Target Date  09/12/19      PT LONG TERM GOAL #6   Title  R shoulder IR and ER will return to [facility] to return to pain-free ADLs such as dressing and grooming.    Baseline  Pt able to bathe but still has difficulty donning shirts overhead    Time  6    Period  Weeks    Status  On-going    Target Date  09/12/19            Plan - 08/19/19 0837    Clinical Impression Statement  Pt able  to AROM RT shoulder flex 135, ABD 119, ER 90 and IR 60, Pt with complaint of Upper Trap RT tightness and spasm.  Pt consented to TPDN and had excellent twitch response and palpable lengthening and reduction of pain. Pt at end of session 2/10.  Pt is now able to sleep through the night 7-8 hours and achieved # 2 LTG.  Pt making good progress with AROM and will continue to work with strengthening and work simulation to shovel about 30# overhead in subsequent visits    Personal Factors and Comorbidities  Comorbidity 1;Profession    Comorbidities  HBP/ heavy Company secretary    Examination-Activity Limitations  Bathing;Dressing;Lift;Hygiene/Grooming;Reach Overhead    Examination-Participation  Restrictions  Driving;Other    Rehab Potential  Good    PT Frequency  2x / week    PT Duration  6 weeks    PT Treatment/Interventions  ADLs/Self Care Home Management;Cryotherapy;Electrical Stimulation;Iontophoresis 4mg /ml Dexamethasone;Moist Heat;Ultrasound;Neuromuscular re-education;Therapeutic exercise;Therapeutic activities;Manual techniques;Dry needling;Passive range of  motion;Joint Manipulations    PT Next Visit Plan  check Reinforce HEP from last visit.  Pt needs to progress with KB and heavy lifting up to 45 # progress as able . Please simulate using shovel for work.    PT Home Exercise Plan  supine cane exercises, elbow extension and wall clockUE neural flossing. prone exercises and standing sub scapular , neck chin retraction., scapular retraction, levator and upper trap    Consulted and Agree with Plan of Care  Patient       Patient will benefit from skilled therapeutic intervention in order to improve the following deficits and impairments:  Pain, Impaired UE functional use, Improper body mechanics, Postural dysfunction, Impaired flexibility, Increased fascial restricitons, Decreased strength, Decreased range of motion  Visit Diagnosis: Muscle weakness (generalized)  Stiffness of right shoulder, not elsewhere classified  Right shoulder pain, unspecified chronicity     Problem List Patient Active Problem List   Diagnosis Date Noted  . Asymptomatic stenosis of right carotid artery without infarction 06/02/2019  . Carotid stenosis 05/20/2019  . H/O: CVA  per MRI 05/14/2018  . DJD (degenerative joint disease) 04/21/2016  . PCP NOTES >>>>> 11/06/2015  . Hyperglycemia 12/28/2013  . Abnormal TSH 12/28/2013  . Annual physical exam 07/19/2012  . Back pain 07/19/2012  . GOUT 11/15/2010  . Hyperlipidemia 08/29/2007  . Essential hypertension 06/27/2007  . GERD 06/27/2007   Voncille Lo, PT Certified Exercise Expert for the Aging Adult  08/19/19 8:49 AM Phone:  [phone] Fax: [phone]  [facility] [address] [place], [place], [address] Phone: [phone]   Fax:  [phone]  Name: KONSTANTINE VOGELPOHL MRN: [id number] Date of Birth: [birth year]

## 2019-08-21 ENCOUNTER — Other Ambulatory Visit: Payer: Self-pay

## 2019-08-21 ENCOUNTER — Encounter: Payer: Self-pay | Admitting: Physical Therapy

## 2019-08-21 ENCOUNTER — Ambulatory Visit: Payer: 59 | Admitting: Physical Therapy

## 2019-08-21 DIAGNOSIS — M6281 Muscle weakness (generalized): Secondary | ICD-10-CM | POA: Diagnosis not present

## 2019-08-21 DIAGNOSIS — M25611 Stiffness of right shoulder, not elsewhere classified: Secondary | ICD-10-CM

## 2019-08-21 DIAGNOSIS — M25511 Pain in right shoulder: Secondary | ICD-10-CM

## 2019-08-21 NOTE — Therapy (Signed)
[place], [place], [address] Phone: [phone]   Fax:  [phone]  Physical Therapy Treatment  Patient Details  Name: Mike Shepard MRN: [id number] Date of Birth: 11-01-1966 Referring Provider (PT): Monica Martinez MD   Encounter Date: 08/21/2019  PT End of Session - 08/21/19 0930    Visit Number  5    Number of Visits  12    Date for PT Re-Evaluation  09/12/19    Authorization Type  [employer]    PT Start Time  0845    PT Stop Time  0929    PT Time Calculation (min)  44 min       Past Medical History:  Diagnosis Date  . Carotid artery stenosis    right  . Chronic back pain    see surgeries   . GERD (gastroesophageal reflux disease)   . H/O: gout   . Hyperlipidemia   . Hypertension   . PPD positive    Positive PPD, remotely ~2000, s/p Rx    . Pre-diabetes   . Primary osteoarthritis of right hip    end stage  . Stroke [facility])    " mild"  . Wears dentures     Past Surgical History:  Procedure Laterality Date  . BACK SURGERY     2007-2013 (low back)  . ENDARTERECTOMY Right 06/02/2019   Procedure: ENDARTERECTOMY CAROTID RIGHT;  Surgeon: Marty Heck, MD;  Location: [place];  Service: Vascular;  Laterality: Right;  . INTRAOPERATIVE ARTERIOGRAM  06/02/2019   Procedure: Right carotid and cerebral angiogram;  Surgeon: Marty Heck, MD;  Location: [place];  Service: Vascular;;  . MULTIPLE TOOTH EXTRACTIONS    . NECK SURGERY     2010  . OPERATIVE ULTRASOUND Right 06/02/2019   Procedure: Operative Ultrasound;  Surgeon: Marty Heck, MD;  Location: [place];  Service: Vascular;  Laterality: Right;    There were no vitals filed for this visit.  Subjective Assessment - 08/21/19 0851    Subjective  A little sore after needling but much better after soreness went away.    Currently in Pain?  Yes    Pain Score  2     Pain Location  Shoulder    Pain Orientation  Right    Pain  Descriptors / Indicators  Sore                       [facility] Adult PT Treatment/Exercise - 08/21/19 0001      Shoulder Exercises: Prone   Other Prone Exercises   I, Y T and W bil 15 x 2     Y performed with right arm off side of table due to <ROM     Shoulder Exercises: Standing   Extension  Strengthening;Theraband;15 reps;Both    x 2   Theraband Level (Shoulder Extension)  Level 3 (Green)    Row  20 reps    Theraband Level (Shoulder Row)  Level 3 (Green)    Other Standing Exercises  15# 25# Kettle Bell squats, 15# dead lifts from 8 inch cybex step- max cues to engage lats       Shoulder Exercises: Pulleys   Other Pulley Exercises  Free Motion 17# D1 Extension  10# D2 extension 10 x 2 Right only       Neck Exercises: Stretches   Upper Trapezius Stretch  3 reps;Right;30 seconds    Levator Stretch  3 reps;Right;30 seconds  Other Neck Stretches  chin tuck 5 x 10 sec hold               PT Short Term Goals - 08/12/19 0926      PT SHORT TERM GOAL #1   Title  Pt will be able to raise R UE to 90 degrees or higher without exacerbating pain greater than 3/10    Baseline  Pt able to raise to 115 degrees but with 3/10 out of 10 pain    Time  2    Period  Weeks    Status  Achieved    Target Date  08/15/19      PT SHORT TERM GOAL #2   Title  Be independent with initial HEP    Baseline  Pt given intiial HEP and return demo    Time  2    Period  Weeks    Status  Achieved    Target Date  08/15/19      PT SHORT TERM GOAL #3   Title  Report being able to sleep 50% better due to pain, comfort    Baseline  Pt able to sleep on right side at night without waking    Time  2    Period  Weeks    Status  Achieved        PT Long Term Goals - 08/19/19 0815      PT LONG TERM GOAL #1   Title  Pt will be independent with advanced HEP.    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    Baseline  Pt unable to work  at job now    Time  6    Period  Weeks    Status  On-going    Target Date  09/12/19      PT LONG TERM GOAL #6   Title  R shoulder IR  and ER will return to [facility] to return to pain-free ADLs such as dressing and grooming.    Baseline  Pt able to bathe but still has difficulty donning shirts overhead    Time  6    Period  Weeks    Status  On-going    Target Date  09/12/19            Plan - 08/21/19 0926    Clinical Impression Statement  Worked on squats and dead lifts focusing on hip hinge/spine alignement- heavy cues required. Reviewed shoveling with cues for hip hinge/squat to lift shovel and cues for decreased shoulder hike. Continued with PNF strengtheing with RUE and prone scapular strength. Reviewed new HEP of stretches and chin tucks. No complaints post session.    PT Next Visit Plan  check Reinforce HEP from last visit.  Pt needs to progress with KB and heavy lifting up to 45 # progress as able . Please simulate using shovel  for work.    PT Home Exercise Plan  supine cane exercises, elbow extension and wall clockUE neural flossing. prone exercises and standing sub scapular , neck chin retraction., scapular retraction, levator and upper trap       Patient will benefit from skilled therapeutic intervention in order to improve the following deficits and impairments:  Pain, Impaired UE functional use, Improper body mechanics, Postural dysfunction, Impaired flexibility, Increased fascial restricitons, Decreased strength, Decreased range of motion  Visit Diagnosis: Muscle weakness (generalized)  Stiffness of right shoulder, not elsewhere classified  Right shoulder pain, unspecified chronicity     Problem List Patient Active Problem List   Diagnosis Date Noted  . Asymptomatic stenosis of right carotid artery without infarction 06/02/2019  . Carotid stenosis 05/20/2019  . H/O: CVA  per MRI 05/14/2018  . DJD (degenerative joint disease) 04/21/2016  . PCP NOTES >>>>> 11/06/2015  . Hyperglycemia 12/28/2013  . Abnormal TSH 12/28/2013  . Annual physical exam 07/19/2012  . Back pain 07/19/2012  . GOUT 11/15/2010  . Hyperlipidemia 08/29/2007  . Essential hypertension 06/27/2007  . GERD 06/27/2007    Dorene Ar, PTA 08/21/2019, 9:31 AM  [place], [place], [address] Phone: [phone]   Fax:  [phone]  Name: RIVEN SQUICCIARINI MRN: [id number] Date of Birth: [birth year]

## 2019-08-26 ENCOUNTER — Other Ambulatory Visit: Payer: Self-pay

## 2019-08-26 ENCOUNTER — Encounter: Payer: Self-pay | Admitting: Vascular Surgery

## 2019-08-26 ENCOUNTER — Ambulatory Visit (INDEPENDENT_AMBULATORY_CARE_PROVIDER_SITE_OTHER): Payer: Self-pay | Admitting: Vascular Surgery

## 2019-08-26 ENCOUNTER — Encounter: Payer: Self-pay | Admitting: *Deleted

## 2019-08-26 VITALS — BP 128/81 | HR 67 | Temp 97.6°F | Resp 18 | Ht 72.0 in | Wt 242.0 lb

## 2019-08-26 DIAGNOSIS — I6521 Occlusion and stenosis of right carotid artery: Secondary | ICD-10-CM

## 2019-08-26 NOTE — Progress Notes (Signed)
Patient name: Mike Shepard MRN: [id number] DOB: [birth year] Sex: male  REASON FOR VISIT: Ongoing follow-up after right carotid endarterectomy  HPI: Mike Shepard is a 53 y.o. male that presents for ongoing post-op check after right carotid endarterectomy on 06/02/2019.  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  Ultimately I put him in physical therapy.  He has made great progress in physical therapy.  His range of motion and strength in his right shoulder are almost back to normal.  No further issues with his neck incision and no new neurologic symptoms.  Past Medical History:  Diagnosis Date  . Carotid artery stenosis    right  . Chronic back pain    see surgeries   . GERD (gastroesophageal reflux disease)   . H/O: gout   . Hyperlipidemia   . Hypertension   . PPD positive    Positive PPD, remotely ~2000, s/p Rx    . Pre-diabetes   . Primary osteoarthritis of right hip    end stage  . Stroke [facility])    " mild"  . Wears dentures     Past Surgical History:  Procedure Laterality Date  . BACK SURGERY     2007-2013 (low back)  . ENDARTERECTOMY Right 06/02/2019   Procedure: ENDARTERECTOMY CAROTID RIGHT;  Surgeon: Marty Heck, MD;  Location: [place];  Service: Vascular;  Laterality: Right;  . INTRAOPERATIVE ARTERIOGRAM  06/02/2019   Procedure: Right carotid and cerebral angiogram;  Surgeon: Marty Heck, MD;  Location: [place];  Service: Vascular;;  . MULTIPLE TOOTH EXTRACTIONS    . NECK SURGERY     2010  . OPERATIVE ULTRASOUND Right 06/02/2019   Procedure: Operative Ultrasound;  Surgeon: Marty Heck, MD;  Location: [facility];  Service: Vascular;  Laterality: Right;    Family History  Problem Relation Age of Onset  . Hypertension Mother        M,F,Sis, bro  . Diabetes Father        F, M. B  . Diabetes Sister   . Hypertension Sister   .  Diabetes Brother   . Hypertension Brother   . Prostate cancer Paternal Grandfather        dx ~ 77 y/o  . Anesthesia problems Neg Hx   . Colon cancer Neg Hx   . CAD Neg Hx   . Stroke Neg Hx     SOCIAL HISTORY: Social History   Tobacco Use  . Smoking status: Former Smoker    Types: Cigarettes    Quit date: 01/27/2010    Years since quitting: 9.5  . Smokeless tobacco: Current User    Types: Chew  Substance Use Topics  . Alcohol use: Not Currently    Alcohol/week: 2.0 standard drinks    Types: 1 Glasses of wine, 1 Cans of beer per week    No Known Allergies  Current Outpatient Medications  Medication Sig Dispense Refill  . amLODipine (NORVASC) 10 MG tablet Take 1 tablet (10 mg total) by mouth daily. 90 tablet 1  . aspirin EC 81 MG EC tablet Take 1 tablet (81 mg total) by mouth daily.    Marland Kitchen atenolol (TENORMIN) 100 MG tablet Take 0.5 tablets (50 mg total) by mouth daily.    Marland Kitchen atorvastatin (LIPITOR) 20 MG tablet Take 1 tablet (20 mg total) by mouth at bedtime. 30 tablet 3  . buprenorphine (SUBUTEX)  8 MG SUBL SL tablet Place 8 mg under the tongue 2 (two) times a day.     . clopidogrel (PLAVIX) 75 MG tablet Take 1 tablet (75 mg total) by mouth daily. 30 tablet 6  . losartan (COZAAR) 100 MG tablet Take 0.5 tablets (50 mg total) by mouth daily.    . Multiple Vitamin (MULTIVITAMIN WITH MINERALS) TABS Take 1 tablet by mouth daily.    Marland Kitchen omeprazole (PRILOSEC) 20 MG capsule Take 1 capsule (20 mg total) by mouth daily before breakfast. 90 capsule 3   No current facility-administered medications for this visit.     REVIEW OF SYSTEMS:  [X]  denotes positive finding, [ ]  denotes negative finding Cardiac  Comments:  Chest pain or chest pressure:    Shortness of breath upon exertion:    Short of breath when lying flat:    Irregular heart rhythm:        Vascular    Pain in calf, thigh, or hip brought on by ambulation:    Pain in feet at night that wakes you up from your sleep:     Blood  clot in your veins:    Leg swelling:         Pulmonary    Oxygen at home:    Productive cough:     Wheezing:         Neurologic    Sudden weakness in arms or legs:     Sudden numbness in arms or legs:     Sudden onset of difficulty speaking or slurred speech:    Temporary loss of vision in one eye:     Problems with dizziness:         Gastrointestinal    Blood in stool:     Vomited blood:         Genitourinary    Burning when urinating:     Blood in urine:        Psychiatric    Major depression:         Hematologic    Bleeding problems:    Problems with blood clotting too easily:        Skin    Rashes or ulcers:        Constitutional    Fever or chills:      PHYSICAL EXAM: Vitals:   08/26/19 1008  BP: 128/81  Pulse: 67  Resp: 18  Temp: 97.6 F (36.4 C)  TempSrc: Temporal  SpO2: 98%  Weight: 242 lb (109.8 kg)  Height: 6' (1.829 m)    GENERAL: The patient is a well-nourished male, in no acute distress. The vital signs are documented above. CARDIAC: There is a regular rate and rhythm.  VASCULAR:  Right neck incision well healed CN II-XII grossly intact Right shoulder ROM improved, no pain with abduction, strength appears 5/5  DATA:   None  Assessment/Plan:  53 year old male that presents status post right carotid endarterectomy. He has made significant progress in physical therapy with abducting his right shoulder and getting his strength back.  I will let him complete his physical therapy throughout the rest of this month and then he can return to work with no restrictions on October 1.  I will plan to see him back in 9 months from surgery date with carotid duplex for ongoing surveillance.  He had no contralateral disease that requires any closer surveillance.    Marty Heck, MD [facility] Office: [phone] Pager: [phone]

## 2019-08-27 ENCOUNTER — Ambulatory Visit: Payer: 59 | Admitting: Physical Therapy

## 2019-08-27 ENCOUNTER — Encounter: Payer: Self-pay | Admitting: Physical Therapy

## 2019-08-27 DIAGNOSIS — M25511 Pain in right shoulder: Secondary | ICD-10-CM

## 2019-08-27 DIAGNOSIS — M6281 Muscle weakness (generalized): Secondary | ICD-10-CM

## 2019-08-27 DIAGNOSIS — M25611 Stiffness of right shoulder, not elsewhere classified: Secondary | ICD-10-CM

## 2019-08-27 NOTE — Therapy (Signed)
[place], [place], [address] Phone: [phone]   Fax:  [phone]  Physical Therapy Treatment  Patient Details  Name: Mike Shepard MRN: [id number] Date of Birth: 06/02/1966 Referring Provider (PT): Monica Martinez MD   Encounter Date: 08/27/2019  PT End of Session - 08/27/19 0803    Visit Number  6    Number of Visits  12    Date for PT Re-Evaluation  09/12/19    Authorization Type  [employer]HC    PT Start Time  0800    PT Stop Time  0843    PT Time Calculation (min)  43 min       Past Medical History:  Diagnosis Date  . Carotid artery stenosis    right  . Chronic back pain    see surgeries   . GERD (gastroesophageal reflux disease)   . H/O: gout   . Hyperlipidemia   . Hypertension   . PPD positive    Positive PPD, remotely ~2000, s/p Rx    . Pre-diabetes   . Primary osteoarthritis of right hip    end stage  . Stroke [facility])    " mild"  . Wears dentures     Past Surgical History:  Procedure Laterality Date  . BACK SURGERY     2007-2013 (low back)  . ENDARTERECTOMY Right 06/02/2019   Procedure: ENDARTERECTOMY CAROTID RIGHT;  Surgeon: Marty Heck, MD;  Location: [place];  Service: Vascular;  Laterality: Right;  . INTRAOPERATIVE ARTERIOGRAM  06/02/2019   Procedure: Right carotid and cerebral angiogram;  Surgeon: Marty Heck, MD;  Location: [place];  Service: Vascular;;  . MULTIPLE TOOTH EXTRACTIONS    . NECK SURGERY     2010  . OPERATIVE ULTRASOUND Right 06/02/2019   Procedure: Operative Ultrasound;  Surgeon: Marty Heck, MD;  Location: [place];  Service: Vascular;  Laterality: Right;    There were no vitals filed for this visit.  Subjective Assessment - 08/27/19 0802    Subjective  MD said I can go back to work October 1st and to finish my PT. I was a little achey last night but it went away.    Currently in Pain?  No/denies         [facility] PT Assessment -  08/27/19 0001      AROM   Right Shoulder Flexion  145 Degrees    Right Shoulder ABduction  120 Degrees                   [facility] Adult PT Treatment/Exercise - 08/27/19 0001      Shoulder Exercises: Prone   Other Prone Exercises   I, Y T and W bil 15 x 2     Y performed with right arm off side of table due to <ROM     Shoulder Exercises: Standing   External Rotation  20 reps    Theraband Level (Shoulder External Rotation)  Level 4 (Blue)    Internal Rotation  20 reps    Theraband Level (Shoulder Internal Rotation)  Level 4 (Blue)    Extension  Strengthening;Theraband;15 reps;Both    x 2   Theraband Level (Shoulder Extension)  Level 4 (Blue)    Row  20 reps    Theraband Level (Shoulder Row)  Level 4 (Blue)    Other Standing Exercises  5# bicep curl to overhead press,    Other Standing Exercises  25# dead lifts /squats, then 10#  chest press with squat       Shoulder Exercises: Pulleys   Other Pulley Exercises  Free Motion 17# D1 Extension       Shoulder Exercises: ROM/Strengthening   UBE (Upper Arm Bike)  2 min each L3     Lat Pull  20 reps    Lat Pull Limitations  35#    Cybex Press  20 reps    Cybex Press Limitations  35#    Cybex Row  20 reps    Cybex Row Limitations  35# ,mid and low     Other ROM/Strengthening Exercises  abduction wall ladder and wall slides       Shoulder Exercises: Stretch   Other Shoulder Stretches  doorway bilat UE pectoral stretch 30 sec x 3      Neck Exercises: Stretches   Upper Trapezius Stretch  3 reps;Right;30 seconds    Levator Stretch  3 reps;Right;30 seconds    Other Neck Stretches  chin tuck 5 x 10 sec hold               PT Short Term Goals - 08/12/19 0926      PT SHORT TERM GOAL #1   Title  Pt will be able to raise R UE to 90 degrees or higher without exacerbating pain greater than 3/10    Baseline  Pt able to raise to 115 degrees but with 3/10 out of 10 pain    Time  2    Period  Weeks    Status  Achieved     Target Date  08/15/19      PT SHORT TERM GOAL #2   Title  Be independent with initial HEP    Baseline  Pt given intiial HEP and return demo    Time  2    Period  Weeks    Status  Achieved    Target Date  08/15/19      PT SHORT TERM GOAL #3   Title  Report being able to sleep 50% better due to pain, comfort    Baseline  Pt able to sleep on right side at night without waking    Time  2    Period  Weeks    Status  Achieved        PT Long Term Goals - 08/19/19 0815      PT LONG TERM GOAL #1   Title  Pt will be independent with advanced HEP.    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    Baseline  Pt unable to work  at job now    Time  6    Period  Weeks    Status  On-going    Target Date  09/12/19      PT LONG TERM GOAL #6   Title  R shoulder IR and ER will return to [facility] to return to pain-free ADLs such as dressing and grooming.    Baseline  Pt able to bathe but still has  difficulty donning shirts overhead    Time  6    Period  Weeks    Status  On-going    Target Date  09/12/19            Plan - 08/27/19 0844    Clinical Impression Statement  Focused strengthening. AROM improved for flexion, limited in abduction. Added AAROM and he is able to reach full range.    PT Next Visit Plan  check Reinforce HEP from last visit.  Pt needs to progress with KB and heavy lifting up to 45 # progress as able . Please simulate using shovel for work.    PT Home Exercise Plan  supine cane exercises, elbow extension and wall clockUE neural flossing. prone exercises and standing sub scapular , neck chin retraction., scapular retraction, levator and upper trap       Patient will benefit from skilled therapeutic intervention in order to improve the following deficits and impairments:  Pain, Impaired UE functional use, Improper body mechanics, Postural dysfunction, Impaired flexibility, Increased fascial restricitons, Decreased strength, Decreased range of motion  Visit Diagnosis: Muscle weakness (generalized)  Stiffness of right shoulder, not elsewhere classified  Right shoulder pain, unspecified chronicity     Problem List Patient Active Problem List   Diagnosis Date Noted  . Asymptomatic stenosis of right carotid artery without infarction 06/02/2019  . Carotid stenosis 05/20/2019  . H/O: CVA  per MRI 05/14/2018  . DJD (degenerative joint disease) 04/21/2016  . PCP NOTES >>>>> 11/06/2015  . Hyperglycemia 12/28/2013  . Abnormal TSH 12/28/2013  . Annual physical exam 07/19/2012  . Back pain 07/19/2012  . GOUT 11/15/2010  . Hyperlipidemia 08/29/2007  . Essential hypertension 06/27/2007  . GERD 06/27/2007    Dorene Ar, PTA 08/27/2019, 9:01 AM  [facility] [address] [place], [place], [address] Phone: [phone]   Fax:  [phone]  Name: Mike Shepard MRN: [id number] Date of  Birth: July 08, 1966

## 2019-08-28 ENCOUNTER — Other Ambulatory Visit: Payer: Self-pay

## 2019-08-29 ENCOUNTER — Ambulatory Visit: Payer: 59 | Admitting: Physical Therapy

## 2019-08-29 ENCOUNTER — Ambulatory Visit: Payer: 59 | Admitting: Internal Medicine

## 2019-08-29 VITALS — BP 160/73 | HR 68 | Temp 97.6°F | Resp 18 | Wt 246.6 lb

## 2019-08-29 DIAGNOSIS — I10 Essential (primary) hypertension: Secondary | ICD-10-CM

## 2019-08-29 DIAGNOSIS — R739 Hyperglycemia, unspecified: Secondary | ICD-10-CM

## 2019-08-29 DIAGNOSIS — I6521 Occlusion and stenosis of right carotid artery: Secondary | ICD-10-CM

## 2019-08-29 DIAGNOSIS — E785 Hyperlipidemia, unspecified: Secondary | ICD-10-CM

## 2019-08-29 LAB — COMPREHENSIVE METABOLIC PANEL
ALT: 59 U/L — ABNORMAL HIGH (ref 0–53)
AST: 37 U/L (ref 0–37)
Albumin: 4 g/dL (ref 3.5–5.2)
Alkaline Phosphatase: 121 U/L — ABNORMAL HIGH (ref 39–117)
BUN: 10 mg/dL (ref 6–23)
CO2: 28 mEq/L (ref 19–32)
Calcium: 9.6 mg/dL (ref 8.4–10.5)
Chloride: 104 mEq/L (ref 96–112)
Creatinine, Ser: 0.9 mg/dL (ref 0.40–1.50)
GFR: 106.55 mL/min (ref >60.00)
Glucose, Bld: 106 mg/dL — ABNORMAL HIGH (ref 70–99)
Potassium: 4.3 mEq/L (ref 3.5–5.1)
Sodium: 139 mEq/L (ref 135–145)
Total Bilirubin: 0.6 mg/dL (ref 0.2–1.2)
Total Protein: 7.1 g/dL (ref 6.0–8.3)

## 2019-08-29 LAB — HEMOGLOBIN A1C: Hgb A1c MFr Bld: 6.5 % (ref 4.6–6.5)

## 2019-08-29 LAB — LIPID PANEL
Cholesterol: 140 mg/dL (ref 0–200)
HDL: 37.5 mg/dL — ABNORMAL LOW (ref >39.00)
LDL Cholesterol: 69 mg/dL (ref 0–99)
NonHDL: 102.85
Total CHOL/HDL Ratio: 4
Triglycerides: 171 mg/dL — ABNORMAL HIGH (ref 0.0–149.0)
VLDL: 34.2 mg/dL (ref 0.0–40.0)

## 2019-08-29 MED ORDER — ATENOLOL 100 MG PO TABS: 100.0000 mg | ORAL_TABLET | Freq: Every day | ORAL | 0 refills | Status: DC

## 2019-08-29 MED ORDER — LOSARTAN POTASSIUM 100 MG PO TABS: 100.0000 mg | ORAL_TABLET | Freq: Every day | ORAL | 6 refills | Status: DC

## 2019-08-29 NOTE — Progress Notes (Signed)
Subjective:    Patient ID: Mike Shepard, male    DOB: October 31, 1966, 53 y.o.   MRN: [id number]  DOS:  08/29/2019 Type of visit - description: Follow-up Notes from vascular surgery reviewed HTN: BP meds and ambulatory BP readings reviewed History of dizziness and DOE, seen last visit: All resolved High cholesterol: Good compliance with Lipitor.  Due for labs  Review of Systems Denies chest pain no difficulty breathing No nausea, vomiting, diarrhea No cough No headache or motor deficits  Past Medical History:  Diagnosis Date  . Carotid artery stenosis    right  . Chronic back pain    see surgeries   . GERD (gastroesophageal reflux disease)   . H/O: gout   . Hyperlipidemia   . Hypertension   . PPD positive    Positive PPD, remotely ~2000, s/p Rx    . Pre-diabetes   . Primary osteoarthritis of right hip    end stage  . Stroke [facility])    " mild"  . Wears dentures     Past Surgical History:  Procedure Laterality Date  . BACK SURGERY     2007-2013 (low back)  . ENDARTERECTOMY Right 06/02/2019   Procedure: ENDARTERECTOMY CAROTID RIGHT;  Surgeon: Marty Heck, MD;  Location: [place];  Service: Vascular;  Laterality: Right;  . INTRAOPERATIVE ARTERIOGRAM  06/02/2019   Procedure: Right carotid and cerebral angiogram;  Surgeon: Marty Heck, MD;  Location: [place];  Service: Vascular;;  . MULTIPLE TOOTH EXTRACTIONS    . NECK SURGERY     2010  . OPERATIVE ULTRASOUND Right 06/02/2019   Procedure: Operative Ultrasound;  Surgeon: Marty Heck, MD;  Location: [place];  Service: Vascular;  Laterality: Right;    Social History   Socioeconomic History  . Marital status: Married    Spouse name: Not on file  . Number of children: 3  . Years of education: Not on file  . Highest education level: Not on file  Occupational History  . Occupation: [place] [employer] [place] ([place]  Social Needs  . Financial resource strain: Not on file  . Food insecurity    Worry: Not on file     Inability: Not on file  . Transportation needs    Medical: Not on file    Non-medical: Not on file  Tobacco Use  . Smoking status: Former Smoker    Types: Cigarettes    Quit date: 01/27/2010    Years since quitting: 9.5  . Smokeless tobacco: Current User    Types: Chew  Substance and Sexual Activity  . Alcohol use: Not Currently    Alcohol/week: 2.0 standard drinks    Types: 1 Glasses of wine, 1 Cans of beer per week  . Drug use: No    Comment: denies   . Sexual activity: Yes    Birth control/protection: None  Lifestyle  . Physical activity    Days per week: Not on file    Minutes per session: Not on file  . Stress: Not on file  Relationships  . Social Herbalist on phone: Not on file    Gets together: Not on file    Attends religious service: Not on file    Active member of club or organization: Not on file    Attends meetings of clubs or organizations: Not on file    Relationship status: Not on file  . Intimate partner violence    Fear of current or ex partner: Not on  file    Emotionally abused: Not on file    Physically abused: Not on file    Forced sexual activity: Not on file  Other Topics Concern  . Not on file  Social History Narrative   Lives w/ wife, 3 children plus adopted 3 children      Allergies as of 08/29/2019   No Known Allergies     Medication List       Accurate as of August 29, 2019 11:59 PM. If you have any questions, ask your nurse or doctor.        amLODipine 10 MG tablet Commonly known as: NORVASC Take 1 tablet (10 mg total) by mouth daily.   aspirin 81 MG EC tablet Take 1 tablet (81 mg total) by mouth daily.   atenolol 100 MG tablet Commonly known as: TENORMIN Take 1 tablet (100 mg total) by mouth daily. What changed: how much to take   atorvastatin 20 MG tablet Commonly known as: LIPITOR Take 1 tablet (20 mg total) by mouth at bedtime.   buprenorphine 8 MG Subl SL tablet Commonly known as: SUBUTEX Place 8  mg under the tongue 2 (two) times a day.   clopidogrel 75 MG tablet Commonly known as: PLAVIX Take 1 tablet (75 mg total) by mouth daily.   losartan 100 MG tablet Commonly known as: COZAAR Take 1 tablet (100 mg total) by mouth daily. What changed: how much to take   multivitamin with minerals Tabs tablet Take 1 tablet by mouth daily.   omeprazole 20 MG capsule Commonly known as: PRILOSEC Take 1 capsule (20 mg total) by mouth daily before breakfast.           Objective:   Physical Exam BP (!) 160/73 (BP Location: Left Arm, Patient Position: Sitting, Cuff Size: Normal)   Pulse 68   Temp 97.6 F (36.4 C) (Temporal)   Resp 18   Wt 246 lb 9.6 oz (111.9 kg)   SpO2 99%   BMI 33.44 kg/m  General:   Well developed, NAD, BMI noted. HEENT:  Normocephalic . Face symmetric, atraumatic Lungs:  CTA B Normal respiratory effort, no intercostal retractions, no accessory muscle use. Heart: RRR,  no murmur.  No pretibial edema bilaterally  Skin: Not pale. Not jaundice Neurologic:  alert & oriented X3.  Speech normal, gait appropriate for age and unassisted Psych--  Cognition and judgment appear intact.  Cooperative with normal attention span and concentration.  Behavior appropriate. No anxious or depressed appearing.      Assessment    Assessment Prediabetes A1c 6.1 2015 HTN Hyperlipidemia CV: --MRI- brain  02-2018: Remote infarct, remote left basal ganglia and corona radiator perforated infarct -- R endarterectomy 05/2019 GERD Elevated TSH 2014 H/o gout  DJD- hip Chronic back pain + PPD 2000, s/p  antibiotics   PLAN Prediabetes: Diet controlled, check A1c. HTN: Currently on amlodipine 10 mg, atenolol 100 mg half tablet, losartan 100 mg 1 tablet.  Ambulatory SBPs 128, 140. BP today : 160/73 , I recheck it at the left arm: 160/90. Plan: Increase atenolol 100 mg to a whole tablet daily, other medications the same, BP goal 120s.  Watch for bradycardia with symptoms,  see AVS. Check a CMP High cholesterol: On Lipitor, last LDL 87 (while on atorvastatin but compliance was not as good as it is now).  LDL goal 70, check a FLP. Carotid artery disease  Was seen by vascular surgery 07/22/2019, they noted some trouble with the right shoulder after surgery and  refer him to physical therapy.  Subsequently was seen 08/26/2019 and noted to be doing progress. Was ok to return to work October 1 Preventive care: Strongly recommend a flu shot, benefits discussed.  Declined it. RTC CPX 4 months

## 2019-08-29 NOTE — Patient Instructions (Signed)
GO TO THE LAB : Get the blood work     GO TO THE FRONT DESK Schedule your next appointment   for a physical exam in 4 months  Increase atenolol 100 mg to 1 tablet daily This is a blood pressure medication but also will decrease her heart rate, it should not be less than 55 or 50.  If it is very low or you feel dizzy, fatigue please let me know.  Check the  blood pressure 2 or 3 times a week BP GOAL is between 110/65 and  135/85. If it is consistently higher or lower, let me know

## 2019-08-30 NOTE — Assessment & Plan Note (Signed)
Prediabetes: Diet controlled, check A1c. HTN: Currently on amlodipine 10 mg, atenolol 100 mg half tablet, losartan 100 mg 1 tablet.  Ambulatory SBPs 128, 140. BP today : 160/73 , I recheck it at the left arm: 160/90. Plan: Increase atenolol 100 mg to a whole tablet daily, other medications the same, BP goal 120s.  Watch for bradycardia with symptoms, see AVS. Check a CMP High cholesterol: On Lipitor, last LDL 87 (while on atorvastatin but compliance was not as good as it is now).  LDL goal 70, check a FLP. Carotid artery disease  Was seen by vascular surgery 07/22/2019, they noted some trouble with the right shoulder after surgery and refer him to physical therapy.  Subsequently was seen 08/26/2019 and noted to be doing progress. Was ok to return to work October 1 Preventive care: Strongly recommend a flu shot, benefits discussed.  Declined it. RTC CPX 4 months

## 2019-09-02 ENCOUNTER — Other Ambulatory Visit: Payer: Self-pay

## 2019-09-02 ENCOUNTER — Ambulatory Visit: Payer: 59 | Admitting: Physical Therapy

## 2019-09-02 ENCOUNTER — Encounter: Payer: Self-pay | Admitting: Physical Therapy

## 2019-09-02 DIAGNOSIS — M25611 Stiffness of right shoulder, not elsewhere classified: Secondary | ICD-10-CM

## 2019-09-02 DIAGNOSIS — M25511 Pain in right shoulder: Secondary | ICD-10-CM

## 2019-09-02 DIAGNOSIS — M6281 Muscle weakness (generalized): Secondary | ICD-10-CM | POA: Diagnosis not present

## 2019-09-02 NOTE — Therapy (Signed)
[place], [place], [address] Phone: [phone]   Fax:  [phone]  Physical Therapy Treatment  Patient Details  Name: Mike Shepard MRN: [id number] Date of Birth: 12/31/1965 Referring Provider (PT): Monica Martinez MD   Encounter Date: 09/02/2019  PT End of Session - 09/02/19 0802    Visit Number  7    Number of Visits  12    Date for PT Re-Evaluation  09/12/19    Authorization Type  [employer]    PT Start Time  0800    PT Stop Time  0838    PT Time Calculation (min)  38 min       Past Medical History:  Diagnosis Date  . Carotid artery stenosis    right  . Chronic back pain    see surgeries   . GERD (gastroesophageal reflux disease)   . H/O: gout   . Hyperlipidemia   . Hypertension   . PPD positive    Positive PPD, remotely ~2000, s/p Rx    . Pre-diabetes   . Primary osteoarthritis of right hip    end stage  . Stroke [facility])    " mild"  . Wears dentures     Past Surgical History:  Procedure Laterality Date  . BACK SURGERY     2007-2013 (low back)  . ENDARTERECTOMY Right 06/02/2019   Procedure: ENDARTERECTOMY CAROTID RIGHT;  Surgeon: Marty Heck, MD;  Location: [place];  Service: Vascular;  Laterality: Right;  . INTRAOPERATIVE ARTERIOGRAM  06/02/2019   Procedure: Right carotid and cerebral angiogram;  Surgeon: Marty Heck, MD;  Location: [place];  Service: Vascular;;  . MULTIPLE TOOTH EXTRACTIONS    . NECK SURGERY     2010  . OPERATIVE ULTRASOUND Right 06/02/2019   Procedure: Operative Ultrasound;  Surgeon: Marty Heck, MD;  Location: [place];  Service: Vascular;  Laterality: Right;    There were no vitals filed for this visit.  Subjective Assessment - 09/02/19 0801    Subjective  Getting better and stronger. I slept with no pain. A little achey in the morning and then it goes away.    Currently in Pain?  No/denies         [facility] PT Assessment - 09/02/19  0001      AROM   Right Shoulder ABduction  --   145 at end of session                  [facility] Adult PT Treatment/Exercise - 09/02/19 0001      Shoulder Exercises: Standing   Flexion  20 reps   forward raise   Shoulder Flexion Weight (lbs)  3    ABduction  20 reps   lateral raise   Shoulder ABduction Weight (lbs)  3    Row  --    Other Standing Exercises  5# bicep curl to overhead press,    Other Standing Exercises  15# chest press with squat x 15, 45# kettle bell squat       Shoulder Exercises: Pulleys   Other Pulley Exercises  Free Motion 17# D1 Extension  10# D2 extension 10 x 2 Right only       Shoulder Exercises: ROM/Strengthening   UBE (Upper Arm Bike)  2 min each L3     Lat Pull  20 reps    Lat Pull Limitations  35#    Cybex Press  20 reps    Cybex Press Limitations  35#    Cybex Row  20 reps    Cybex Row Limitations  45# ,mid and low     Other ROM/Strengthening Exercises  abduction wall slides      Shoulder Exercises: Stretch   Other Shoulder Stretches  doorway bilat UE pectoral stretch 30 sec x 3      Neck Exercises: Stretches   Upper Trapezius Stretch  3 reps;Right;30 seconds    Levator Stretch  3 reps;Right;30 seconds    Other Neck Stretches  chin tuck 5 x 10 sec hold               PT Short Term Goals - 08/12/19 0926      PT SHORT TERM GOAL #1   Title  Pt will be able to raise R UE to 90 degrees or higher without exacerbating pain greater than 3/10    Baseline  Pt able to raise to 115 degrees but with 3/10 out of 10 pain    Time  2    Period  Weeks    Status  Achieved    Target Date  08/15/19      PT SHORT TERM GOAL #2   Title  Be independent with initial HEP    Baseline  Pt given intiial HEP and return demo    Time  2    Period  Weeks    Status  Achieved    Target Date  08/15/19      PT SHORT TERM GOAL #3   Title  Report being able to sleep 50% better due to pain, comfort    Baseline  Pt able to sleep on right side at night  without waking    Time  2    Period  Weeks    Status  Achieved        PT Long Term Goals - 08/19/19 0815      PT LONG TERM GOAL #1   Title  Pt will be independent with advanced HEP.    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    Baseline  Pt unable to work  at job now    Time  6    Period  Weeks    Status  On-going    Target Date  09/12/19      PT LONG TERM GOAL #6   Title  R shoulder IR and ER will return to [facility] to return to pain-free ADLs such as dressing and grooming.    Baseline  Pt able to bathe but still has difficulty donning shirts overhead    Time  6    Period  Weeks    Status  On-going    Target Date  09/12/19            Plan - 09/02/19 0803    Clinical Impression Statement  Pt reports decreased pain with driving and intermittent however still some pain with right horizontal adduction/IR on driving wheel. He has resumed donnong and doffing shirts normally  however still has some pain with this.His abduction AROM is improved today. He has started walking to prep for RTW. Continued with progressive strengthening and lifting for RTW. No increased pain, only muscle fatigue.    PT Next Visit Plan  check Reinforce HEP from last visit.  Pt needs to progress with KB and heavy lifting up to 45 # progress as able . Please simulate using shovel for work.    PT Home Exercise Plan  supine cane exercises, elbow extension and wall clockUE neural flossing. prone exercises and standing sub scapular , neck chin retraction., scapular retraction, levator and upper trap       Patient will benefit from skilled therapeutic intervention in order to improve the following deficits and impairments:  Pain, Impaired UE functional use, Improper body mechanics, Postural dysfunction, Impaired flexibility, Increased fascial restricitons, Decreased strength, Decreased range of motion  Visit Diagnosis: Muscle weakness (generalized)  Stiffness of right shoulder, not elsewhere classified  Right shoulder pain, unspecified chronicity     Problem List Patient Active Problem List   Diagnosis Date Noted  . Asymptomatic stenosis of right carotid artery without infarction 06/02/2019  . Carotid stenosis 05/20/2019  . H/O: CVA  per MRI 05/14/2018  . DJD (degenerative joint disease) 04/21/2016  . PCP NOTES >>>>> 11/06/2015  . Hyperglycemia 12/28/2013  . Abnormal TSH 12/28/2013  . Annual physical exam 07/19/2012  . Back pain 07/19/2012  . GOUT 11/15/2010  . Hyperlipidemia 08/29/2007  . Essential hypertension 06/27/2007  . GERD 06/27/2007    Dorene Ar, PTA 09/02/2019, 8:45 AM  [facility] [address] [place], [place], [address] Phone: [phone]   Fax:  [phone]  Name: Mike Shepard MRN: [id number] Date of Birth: 1966/04/20

## 2019-09-04 MED ORDER — ATORVASTATIN CALCIUM 20 MG PO TABS: 20.0000 mg | ORAL_TABLET | Freq: Every day | ORAL | 1 refills | Status: DC

## 2019-09-04 NOTE — Addendum Note (Signed)
Addended byDamita Dunnings D on: 09/04/2019 08:32 AM   Modules accepted: Orders

## 2019-09-05 ENCOUNTER — Encounter: Payer: Self-pay | Admitting: Physical Therapy

## 2019-09-05 ENCOUNTER — Other Ambulatory Visit: Payer: Self-pay

## 2019-09-05 ENCOUNTER — Ambulatory Visit: Payer: 59 | Admitting: Physical Therapy

## 2019-09-05 DIAGNOSIS — M6281 Muscle weakness (generalized): Secondary | ICD-10-CM | POA: Diagnosis not present

## 2019-09-05 DIAGNOSIS — M25511 Pain in right shoulder: Secondary | ICD-10-CM

## 2019-09-05 DIAGNOSIS — M25611 Stiffness of right shoulder, not elsewhere classified: Secondary | ICD-10-CM

## 2019-09-05 NOTE — Therapy (Addendum)
[place], [place], [address] Phone: [phone]   Fax:  [phone]  Physical Therapy Treatment/Discharge Note  Patient Details  Name: Mike Shepard MRN: [id number] Date of Birth: Mar 10, 1966 Referring Provider (PT): Monica Martinez MD   Encounter Date: 09/05/2019  PT End of Session - 09/05/19 0934    Visit Number  8    Number of Visits  12    Date for PT Re-Evaluation  09/12/19    Authorization Type  [employer]    PT Start Time  [phone]    PT Stop Time  0930    PT Time Calculation (min)  44 min    Activity Tolerance  Patient tolerated treatment well    Behavior During Therapy  [facility] for tasks assessed/performed       Past Medical History:  Diagnosis Date  . Carotid artery stenosis    right  . Chronic back pain    see surgeries   . GERD (gastroesophageal reflux disease)   . H/O: gout   . Hyperlipidemia   . Hypertension   . PPD positive    Positive PPD, remotely ~2000, s/p Rx    . Pre-diabetes   . Primary osteoarthritis of right hip    end stage  . Stroke [facility])    " mild"  . Wears dentures     Past Surgical History:  Procedure Laterality Date  . BACK SURGERY     2007-2013 (low back)  . ENDARTERECTOMY Right 06/02/2019   Procedure: ENDARTERECTOMY CAROTID RIGHT;  Surgeon: Marty Heck, MD;  Location: [place];  Service: Vascular;  Laterality: Right;  . INTRAOPERATIVE ARTERIOGRAM  06/02/2019   Procedure: Right carotid and cerebral angiogram;  Surgeon: Marty Heck, MD;  Location: [place];  Service: Vascular;;  . MULTIPLE TOOTH EXTRACTIONS    . NECK SURGERY     2010  . OPERATIVE ULTRASOUND Right 06/02/2019   Procedure: Operative Ultrasound;  Surgeon: Marty Heck, MD;  Location: [place];  Service: Vascular;  Laterality: Right;    There were no vitals filed for this visit.  Subjective Assessment - 09/05/19 0854    Subjective  Getting better and stronger and no pain today.  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  No/denies         [facility] PT Assessment - 09/05/19 0001      AROM   Right Shoulder Flexion  145 Degrees    Right Shoulder ABduction  145 Degrees                   [facility] Adult PT Treatment/Exercise - 09/05/19 0001      Shoulder Exercises: Standing   External Rotation  20 reps    Theraband Level (Shoulder External Rotation)  Level 4 (Blue)    Internal Rotation  20 reps    Theraband Level (Shoulder Internal Rotation)  Level 4 (Blue)    Flexion  20 reps;Strengthening;Right;Weights    Shoulder Flexion Weight (lbs)  5    ABduction  Strengthening;20 reps;Weights;Right   tempo up 3 sec down 3sec and hold 1 sec   Shoulder ABduction Weight (lbs)  5    Extension  Strengthening    Extension Limitations  on FRee motion 17 pounds    Other Standing Exercises  KB swings with 30# and 45#    Other Standing Exercises  15# chest press with squat x 15, 45# kettle bell squat       Shoulder Exercises: Pulleys   Other Pulley Exercises  Free Motion 17# D1 Extension  10# D2 extension 10 x 2 Right only  17 # with mid trap and ER motion 2 x 10       Shoulder Exercises: ROM/Strengthening   Lat Pull  20 reps    Lat Pull Limitations  35#    Cybex Press  20 reps    Cybex Press Limitations  40#    Cybex Row  20 reps    Cybex Row Limitations  45# ,mid and low     Other ROM/Strengthening Exercises  abduction wall slides      Shoulder Exercises: Stretch   Other Shoulder Stretches  doorway bilat UE pectoral stretch 30 sec x 3             PT  Education - 09/05/19 0919    Education Details  reinforced HEP and gym equipment eduation    Person(s) Educated  Patient    Methods  Explanation;Demonstration    Comprehension  Verbalized understanding;Returned demonstration       PT Short Term Goals - 08/12/19 0926      PT SHORT TERM GOAL #1   Title  Pt will be able to raise R UE to 90 degrees or higher without exacerbating pain greater than 3/10    Baseline  Pt able to raise to 115 degrees but with 3/10 out of 10 pain    Time  2    Period  Weeks    Status  Achieved    Target Date  08/15/19      PT SHORT TERM GOAL #2   Title  Be independent with initial HEP    Baseline  Pt given intiial HEP and return demo    Time  2    Period  Weeks    Status  Achieved    Target Date  08/15/19      PT SHORT TERM GOAL #3   Title  Report being able to sleep 50% better due to pain, comfort    Baseline  Pt able to sleep on right side at night without waking    Time  2    Period  Weeks    Status  Achieved        PT Long Term Goals - 09/05/19 0001      PT LONG TERM GOAL #1   Title  Pt will be independent with advanced HEP.    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    Baseline  pt able to lift/simulate in clinic with 30#  getting stonger with 45#    Time  6    Period  Weeks    Status  On-going    Target Date  09/12/19      PT LONG TERM GOAL #6   Title  R shoulder IR and ER will return to [facility] to return to pain-free ADLs such as dressing and grooming.    Baseline   difficulty donning shirts overhead   Pt able to don /doff shirts and bathe no pain   Time  6    Period  Weeks    Status  Achieved    Target Date  09/12/19            Plan - 09/05/19 2800    Clinical Impression Statement  Pt reports 0/10 pain and feeling stronger,  Working with 30 # KB and gym equipment today. Pt has achieved #2,4, and 6 goals.. today. Pt with no pain with sleep or donning.doffing clothes or bathing.  able to drive and turn steering wheel wihtout pain Pt abd/flex on Right shoulder 145/145 degrees no pain  Pt is on target for DC next week.    Comorbidities  HBP/ heavy equipment operator    Examination-Activity Limitations  Bathing;Dressing;Lift;Hygiene/Grooming;Reach Overhead    Examination-Participation Restrictions  Driving;Other    Rehab Potential  Good    PT Frequency  2x / week    PT Duration  6 weeks    PT Treatment/Interventions  ADLs/Self Care Home Management;Cryotherapy;Electrical Stimulation;Iontophoresis 18m/ml Dexamethasone;Moist Heat;Ultrasound;Neuromuscular re-education;Therapeutic exercise;Therapeutic activities;Manual techniques;Dry needling;Passive range of motion;Joint Manipulations    PT Next Visit Plan  check Reinforce HEP from last visit.  Pt needs to progress with KB and heavy lifting up to 45 # progress as able . Please simulate using shovel for work. DO FOTO next visit prepare advanced HEP    PT Home  Exercise Plan  supine cane exercises, elbow extension and wall clockUE neural flossing. prone exercises and standing sub scapular , neck chin retraction., scapular retraction, levator and upper trap    Consulted and Agree with Plan of Care  Patient       Patient will benefit from skilled therapeutic intervention in order to improve the following deficits and impairments:  Pain, Impaired UE functional use, Improper body mechanics, Postural dysfunction, Impaired flexibility, Increased fascial restricitons, Decreased strength, Decreased range  of motion  Visit Diagnosis: Muscle weakness (generalized)  Stiffness of right shoulder, not elsewhere classified  Right shoulder pain, unspecified chronicity     Problem List Patient Active Problem List   Diagnosis Date Noted  . Asymptomatic stenosis of right carotid artery without infarction 06/02/2019  . Carotid stenosis 05/20/2019  . H/O: CVA  per MRI 05/14/2018  . DJD (degenerative joint disease) 04/21/2016  . PCP NOTES >>>>> 11/06/2015  . Hyperglycemia 12/28/2013  . Abnormal TSH 12/28/2013  . Annual physical exam 07/19/2012  . Back pain 07/19/2012  . GOUT 11/15/2010  . Hyperlipidemia 08/29/2007  . Essential hypertension 06/27/2007  . GERD 06/27/2007    Voncille Lo, PT Certified Exercise Expert for the Aging Adult  09/05/19 9:35 AM Phone: [phone] Fax: [facility] [address] [place], [place], [address] Phone: [phone]   Fax:  [phone]  Name: AHLIJAH RAIA MRN: [id number] Date of Birth: 12-Mar-1966   PHYSICAL THERAPY DISCHARGE SUMMARY  Visits from Start of Care: 8  Current functional level related to goals / functional outcomes: unknown   Remaining deficits: unknown   Education / Equipment: HEP Plan:                                                    Patient goals were partially met. Patient is being discharged due to not returning  since the last visit.  ?????    Pt was doing well and about to return to work.  Left message for last visit but pt did not return Voncille Lo, PT Certified Exercise Expert for the Aging Adult  09/30/19 2:46 PM Phone: [phone] Fax: [phone]

## 2019-09-09 ENCOUNTER — Ambulatory Visit: Payer: 59 | Admitting: Physical Therapy

## 2019-09-09 ENCOUNTER — Telehealth: Payer: Self-pay | Admitting: Physical Therapy

## 2019-09-09 NOTE — Telephone Encounter (Signed)
Left message at 8:31AM  Pt did not show for appt at 8:00.  Uncharacteristic of pt.  Mr. Ice was left message with reminder of last appt on Friday Sept 25 at 8:00 AM Voncille Lo, PT Certified Exercise Expert for the Aging Adult  09/09/19 8:32 AM Phone: [phone] Fax: [phone]

## 2019-09-12 ENCOUNTER — Ambulatory Visit: Payer: 59 | Admitting: Physical Therapy

## 2019-10-17 ENCOUNTER — Encounter: Payer: Self-pay | Admitting: Gastroenterology

## 2019-10-17 ENCOUNTER — Telehealth: Payer: Self-pay

## 2019-10-17 DIAGNOSIS — Z1211 Encounter for screening for malignant neoplasm of colon: Secondary | ICD-10-CM

## 2019-10-17 NOTE — Telephone Encounter (Signed)
Copied from [place] [id number]. Topic: Referral - Request for Referral >> Oct 17, 2019 12:05 PM Berneta Levins wrote: Has patient seen PCP for this complaint? yes *If NO, is insurance requiring patient see PCP for this issue before PCP can refer them? Referral for which specialty: gastroenterology Preferred provider/office: no  preference Reason for referral: states he waited to long and the referral that is on file for him at GI has expired and he needs a new one.  Pt can be reached at [phone]

## 2019-10-17 NOTE — Telephone Encounter (Signed)
GI referral placed to [facility] for cscope.

## 2019-11-17 ENCOUNTER — Other Ambulatory Visit: Payer: Self-pay | Admitting: Internal Medicine

## 2019-11-25 ENCOUNTER — Telehealth: Payer: Self-pay

## 2019-11-25 MED ORDER — AMLODIPINE BESYLATE 10 MG PO TABS: 10.0000 mg | ORAL_TABLET | Freq: Every day | ORAL | 1 refills | Status: DC

## 2019-11-25 MED ORDER — ATORVASTATIN CALCIUM 20 MG PO TABS: 20.0000 mg | ORAL_TABLET | Freq: Every day | ORAL | 1 refills | Status: DC

## 2019-11-25 MED ORDER — LOSARTAN POTASSIUM 100 MG PO TABS: 100.0000 mg | ORAL_TABLET | Freq: Every day | ORAL | 1 refills | Status: DC

## 2019-11-25 MED ORDER — CLOPIDOGREL BISULFATE 75 MG PO TABS: 75.0000 mg | ORAL_TABLET | Freq: Every day | ORAL | 1 refills | Status: DC

## 2019-11-25 MED ORDER — OMEPRAZOLE 20 MG PO CPDR: 20.0000 mg | DELAYED_RELEASE_CAPSULE | Freq: Every day | ORAL | 3 refills | Status: DC

## 2019-11-25 MED ORDER — ATENOLOL 100 MG PO TABS: 100.0000 mg | ORAL_TABLET | Freq: Every day | ORAL | 1 refills | Status: DC

## 2019-11-25 NOTE — Telephone Encounter (Signed)
Spoke w/ Pt- he is now using [facility] for meds- refills sent.

## 2019-11-28 ENCOUNTER — Encounter: Payer: Self-pay | Admitting: Gastroenterology

## 2019-11-28 ENCOUNTER — Other Ambulatory Visit: Payer: Self-pay

## 2019-11-28 ENCOUNTER — Ambulatory Visit: Payer: 59 | Admitting: Gastroenterology

## 2019-11-28 ENCOUNTER — Telehealth: Payer: Self-pay | Admitting: Emergency Medicine

## 2019-11-28 VITALS — BP 162/92 | HR 52 | Temp 98.5°F | Ht 72.0 in | Wt 249.1 lb

## 2019-11-28 DIAGNOSIS — Z1211 Encounter for screening for malignant neoplasm of colon: Secondary | ICD-10-CM

## 2019-11-28 DIAGNOSIS — I2510 Atherosclerotic heart disease of native coronary artery without angina pectoris: Secondary | ICD-10-CM

## 2019-11-28 DIAGNOSIS — Z1159 Encounter for screening for other viral diseases: Secondary | ICD-10-CM

## 2019-11-28 DIAGNOSIS — R12 Heartburn: Secondary | ICD-10-CM | POA: Diagnosis not present

## 2019-11-28 MED ORDER — NA SULFATE-K SULFATE-MG SULF 17.5-3.13-1.6 GM/177ML PO SOLN: 1.0000 | ORAL | 0 refills | Status: AC

## 2019-11-28 NOTE — Patient Instructions (Signed)
I have recommended a colonoscopy for colon cancer screening.  Tips for colonoscopy:  - Stay well hydrated for 3-4 days prior to the exam. This reduces nausea and dehydration.  - To prevent skin/hemorrhoid irritation - prior to wiping, put A&Dointment or vaseline on the toilet paper. - Keep a towel or pad on the bed.  - Drink  64oz of clear liquids in the morning of prep day (prior to starting the prep) to be sure that there is enough fluid to flush the colon and stay hydrated!!!! This is in addition to the fluids required for preparation. - Use of a flavored hard candy, such as grape Anise Salvo, can counteract some of the flavor of the prep and may prevent some nausea.

## 2019-11-28 NOTE — Telephone Encounter (Signed)
Please discuss stopping Plavix with Dr. Monica Martinez. The patient had a endarterectomy this year.

## 2019-11-28 NOTE — Progress Notes (Signed)
Referring Provider: Colon Branch, MD Primary Care Physician:  Colon Branch, MD  Reason for Consultation:  Colon cancer screening   IMPRESSION:  Need for colon cancer screening Plavix for carotid artery disease (prescribed by Dr. Larose Kells) Heartburn controlled on omeprazole 20 mg every morning Mother with colon polyps No known family history of colon cancer  Need for colon cancer screening: Colonoscopy recommended for colon cancer screening.  Will need to hold Plavix for 5 days before endoscopy.  I discussed with the patient that there is a low, but real, risk of a cardiovascular event such as heart attack, stroke, or embolism/thrombosis while off Plavix. Will communicate with Dr. Larose Kells to confirm that holding the Plavix is appropriate at this time.   Heartburn: No alarm features.  Symptoms are adequately controlled on omeprazole 20 mg every morning.  PLAN: Colonoscopy after Plavix washout  Please see the "Patient Instructions" section for addition details about the plan.  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  History of hypertension, prediabetes, hypercholesterolemia, carotid artery disease requiring endarterectomy 06/02/19. He is on longterm Plavix given tandem intracranial lesions noted on his operative report.   Heartburn is well controlled on omeprazole 20 mg every morning. There is no dysphagia, odynophagia, regurgitation, nausea, abdominal pain, change in bowel habits, melena, hematochezia, or bright red blood per rectum. There has been no recent anorexia or recent change in weight.   Wife with colon polyps. Mom with colon polyps. Paternal uncle died of colon cancer at age 71. Son with Crohn's disease. No other known family history of colon cancer or polyps. No family history of uterine/endometrial cancer, pancreatic cancer or gastric/stomach cancer.   Past Medical History:  Diagnosis Date  . Carotid artery stenosis    right   . Chronic back pain    see surgeries   . GERD (gastroesophageal reflux disease)   . H/O: gout   . Hyperlipidemia   . Hypertension   . PPD positive    Positive PPD, remotely ~2000, s/p Rx    . Pre-diabetes   . Primary osteoarthritis of right hip    end stage  . Stroke [facility])    " mild"  . Wears dentures     Past Surgical History:  Procedure Laterality Date  . BACK SURGERY     2007-2013 (low back)  . ENDARTERECTOMY Right 06/02/2019   Procedure: ENDARTERECTOMY CAROTID RIGHT;  Surgeon: Marty Heck, MD;  Location: [place];  Service: Vascular;  Laterality: Right;  . INTRAOPERATIVE ARTERIOGRAM  06/02/2019   Procedure: Right carotid and cerebral angiogram;  Surgeon: Marty Heck, MD;  Location: [place];  Service: Vascular;;  . MULTIPLE TOOTH EXTRACTIONS    . NECK SURGERY     2010  . OPERATIVE ULTRASOUND Right 06/02/2019   Procedure: Operative Ultrasound;  Surgeon: Marty Heck, MD;  Location: [place];  Service: Vascular;  Laterality: Right;    Current Outpatient Medications  Medication Sig Dispense Refill  . amLODipine (NORVASC) 10 MG tablet Take 1 tablet (10 mg total) by mouth daily. 90 tablet 1  . aspirin EC 81 MG EC tablet Take 1 tablet (81 mg total) by mouth daily.    Marland Kitchen atenolol (TENORMIN) 100 MG tablet Take 1 tablet (100 mg total) by mouth daily. 90 tablet 1  . atorvastatin (LIPITOR) 20 MG tablet Take 1 tablet (20 mg total) by mouth at bedtime. 90 tablet 1  . buprenorphine (SUBUTEX) 8  MG SUBL SL tablet Place 8 mg under the tongue 2 (two) times a day.     . clopidogrel (PLAVIX) 75 MG tablet Take 1 tablet (75 mg total) by mouth daily. 90 tablet 1  . losartan (COZAAR) 100 MG tablet Take 1 tablet (100 mg total) by mouth daily. 90 tablet 1  . Multiple Vitamin (MULTIVITAMIN WITH MINERALS) TABS Take 1 tablet by mouth daily.    Marland Kitchen omeprazole (PRILOSEC) 20 MG capsule Take 1 capsule (20 mg total) by mouth daily before breakfast. 90 capsule 3   No current  facility-administered medications for this visit.    Allergies as of 11/28/2019  . (No Known Allergies)    Family History  Problem Relation Age of Onset  . Hypertension Mother        M,F,Sis, bro  . Diabetes Father        F, M. B  . Diabetes Sister   . Hypertension Sister   . Diabetes Brother   . Hypertension Brother   . Prostate cancer Paternal Grandfather        dx ~ 50 y/o  . Anesthesia problems Neg Hx   . Colon cancer Neg Hx   . CAD Neg Hx   . Stroke Neg Hx     Social History   Socioeconomic History  . Marital status: Married    Spouse name: Not on file  . Number of children: 3  . Years of education: Not on file  . Highest education level: Not on file  Occupational History  . Occupation: [employer] [place] ([employer])  Tobacco Use  . Smoking status: Former Smoker    Types: Cigarettes    Quit date: 01/27/2010    Years since quitting: 9.8  . Smokeless tobacco: Current User    Types: Chew  Substance and Sexual Activity  . Alcohol use: Not Currently    Alcohol/week: 2.0 standard drinks    Types: 1 Glasses of wine, 1 Cans of beer per week  . Drug use: No    Comment: denies   . Sexual activity: Yes    Birth control/protection: None  Other Topics Concern  . Not on file  Social History Narrative   Lives w/ wife, 3 children plus adopted 3 children   Social Determinants of Health   Financial Resource Strain:   . Difficulty of Paying Living Expenses: Not on file  Food Insecurity:   . Worried About Charity fundraiser in the Last Year: Not on file  . Ran Out of Food in the Last Year: Not on file  Transportation Needs:   . Lack of Transportation (Medical): Not on file  . Lack of Transportation (Non-Medical): Not on file  Physical Activity:   . Days of Exercise per Week: Not on file  . Minutes of Exercise per Session: Not on file  Stress:   . Feeling of Stress : Not on file  Social Connections:   . Frequency of Communication with Friends and Family: Not on file  .  Frequency of Social Gatherings with Friends and Family: Not on file  . Attends Religious Services: Not on file  . Active Member of Clubs or Organizations: Not on file  . Attends Archivist Meetings: Not on file  . Marital Status: Not on file  Intimate Partner Violence:   . Fear of Current or Ex-Partner: Not on file  . Emotionally Abused: Not on file  . Physically Abused: Not on file  . Sexually Abused: Not on file  Review of Systems: 12 system ROS is negative except as noted above.   Physical Exam: General:   Alert,  well-nourished, pleasant and cooperative in NAD Head:  Normocephalic and atraumatic. Eyes:  Sclera clear, no icterus.   Conjunctiva pink. Ears:  Normal auditory acuity. Nose:  No deformity, discharge,  or lesions. Mouth:  No deformity or lesions.   Neck:  Supple; no masses or thyromegaly. Lungs:  Clear throughout to auscultation.   No wheezes. Heart:  Regular rate and rhythm; no murmurs. Abdomen:  Soft,nontender, nondistended, normal bowel sounds, no rebound or guarding. No hepatosplenomegaly.   Rectal:  Deferred  Msk:  Symmetrical. No boney deformities LAD: No inguinal or umbilical LAD Extremities:  No clubbing or edema. Neurologic:  Alert and  oriented x4;  grossly nonfocal Skin:  Intact without significant lesions or rashes. Psych:  Alert and cooperative. Normal mood and affect.    An Lannan L. Tarri Glenn, MD, MPH 11/28/2019, 9:33 AM

## 2019-11-28 NOTE — Telephone Encounter (Signed)
   Mike Shepard Jan 31, 1966 [id number]  Dear Dr. Larose Kells:  We have scheduled the above named patient for a(n) colonoscopy procedure. Our records show that (s)he is on anticoagulation therapy.  Please advise as to whether the patient may come off their therapy of Plavix 5 days prior to their procedure which is scheduled for 12-04-2019.  Please route your response to Tinnie Gens, CMA or fax response to [phone].  Sincerely,    [facility]

## 2019-11-30 NOTE — Telephone Encounter (Signed)
It should be ok if deemed necessary.  He has tandem intracranial stenosis and is higher risk than the average patient.  Thanks

## 2019-12-01 NOTE — Telephone Encounter (Signed)
May schedule at patient's convenience. Thanks.

## 2019-12-01 NOTE — Telephone Encounter (Signed)
Left message on patients voicemail to call office.   

## 2019-12-01 NOTE — Telephone Encounter (Signed)
Ok to proceed? Please see note from Dr. Carlis Abbott

## 2019-12-02 NOTE — Telephone Encounter (Signed)
Patient returned your call.

## 2019-12-02 NOTE — Telephone Encounter (Signed)
Left message on patients voicemail to call office.   

## 2019-12-04 ENCOUNTER — Encounter: Payer: 59 | Admitting: Gastroenterology

## 2019-12-11 NOTE — Telephone Encounter (Signed)
Spoke with patient and informed him that he can hold his Plavix 5 days before his procedure. He verbalized understanding and will stop on 12-20-2019 and resume after the procedure as directed.

## 2019-12-22 ENCOUNTER — Encounter: Payer: Self-pay | Admitting: Gastroenterology

## 2019-12-25 LAB — NOVEL CORONAVIRUS, NAA: SARS-CoV-2, NAA: DETECTED

## 2019-12-26 ENCOUNTER — Encounter: Payer: 59 | Admitting: Gastroenterology

## 2019-12-26 ENCOUNTER — Telehealth: Payer: Self-pay | Admitting: Internal Medicine

## 2019-12-26 MED ORDER — FAMOTIDINE 20 MG PO TABS: 20.0000 mg | ORAL_TABLET | Freq: Two times a day (BID) | ORAL | 1 refills | Status: DC

## 2019-12-26 NOTE — Telephone Encounter (Signed)
rx sent in for famotidine and omeprazole d/c.

## 2019-12-26 NOTE — Telephone Encounter (Signed)
LM for pt to returncall

## 2019-12-26 NOTE — Telephone Encounter (Signed)
Received a letter from [employer]. He is taking clopidogrel and a  PPI, and there is a potential for interaction. Please tell the patient: -Stop omeprazole -Send a prescription for famotidine 20 mg 1 p.o. q. at bedtime #30 and 6 refills - If he has GERD symptoms let me know

## 2019-12-29 NOTE — Telephone Encounter (Signed)
Patient notified of issue and that new medication was sent in.

## 2019-12-31 ENCOUNTER — Other Ambulatory Visit: Payer: Self-pay

## 2019-12-31 ENCOUNTER — Ambulatory Visit (INDEPENDENT_AMBULATORY_CARE_PROVIDER_SITE_OTHER): Payer: 59 | Admitting: Internal Medicine

## 2019-12-31 DIAGNOSIS — U071 COVID-19: Secondary | ICD-10-CM | POA: Diagnosis not present

## 2019-12-31 NOTE — Progress Notes (Signed)
Subjective:    Patient ID: Mike Shepard, male    DOB: March 13, 1966, 54 y.o.   MRN: [id number]  DOS:  12/31/2019 Type of visit - description: Virtual Visit via Telephone  Attempted  to make this a video visit, due to technical difficulties from the patient side it was not possible  thus we proceeded with a Virtual Visit via Telephone    I connected with above mentioned patient  by telephone and verified that I am speaking with the correct person using two identifiers.  THIS ENCOUNTER IS A VIRTUAL VISIT DUE TO COVID-19 - PATIENT WAS NOT SEEN IN THE OFFICE. PATIENT HAS CONSENTED TO VIRTUAL VISIT / TELEMEDICINE VISIT   Location of patient: home  Location of provider: office  I discussed the limitations, risks, security and privacy concerns of performing an evaluation and management service by telephone and the availability of in person appointments. I also discussed with the patient that there may be a patient responsible charge related to this service. The patient expressed understanding and agreed to proceed.  Acute visit Symptoms started approximately 12/23/2019: Sore throat, cough, mild sputum production. He was tested for Covid 12/25/2019 and it came back positive. Since then he is in quarantine he already feels better almost asymptomatic. He is however worried and has multiple questions     Review of Systems Had fever only the first day of the illness. No nausea vomiting. No diarrhea No chest pain, difficulty breathing or headaches.  Past Medical History:  Diagnosis Date  . Carotid artery stenosis    right  . Chronic back pain    see surgeries   . GERD (gastroesophageal reflux disease)   . H/O: gout   . Hyperlipidemia   . Hypertension   . PPD positive    Positive PPD, remotely ~2000, s/p Rx    . Pre-diabetes   . Primary osteoarthritis of right hip    end stage  . Stroke [facility])    " mild"  . Wears dentures     Past Surgical History:  Procedure Laterality Date    . BACK SURGERY     2007-2013 (low back)  . ENDARTERECTOMY Right 06/02/2019   Procedure: ENDARTERECTOMY CAROTID RIGHT;  Surgeon: Marty Heck, MD;  Location: [place];  Service: Vascular;  Laterality: Right;  . INTRAOPERATIVE ARTERIOGRAM  06/02/2019   Procedure: Right carotid and cerebral angiogram;  Surgeon: Marty Heck, MD;  Location: [place];  Service: Vascular;;  . MULTIPLE TOOTH EXTRACTIONS    . NECK SURGERY     2010  . OPERATIVE ULTRASOUND Right 06/02/2019   Procedure: Operative Ultrasound;  Surgeon: Marty Heck, MD;  Location: [place];  Service: Vascular;  Laterality: Right;    Social History   Socioeconomic History  . Marital status: Married    Spouse name: Not on file  . Number of children: 3  . Years of education: Not on file  . Highest education level: Not on file  Occupational History  . Occupation: city of [place] ([place]  Tobacco Use  . Smoking status: Former Smoker    Types: Cigarettes    Quit date: 01/27/2010    Years since quitting: 9.9  . Smokeless tobacco: Current User    Types: Chew  Substance and Sexual Activity  . Alcohol use: Not Currently  . Drug use: No    Comment: denies   . Sexual activity: Yes    Birth control/protection: None  Other Topics Concern  . Not on file  Social History Narrative   Lives w/ wife, 3 children plus adopted 3 children   Social Determinants of Health   Financial Resource Strain:   . Difficulty of Paying Living Expenses: Not on file  Food Insecurity:   . Worried About Charity fundraiser in the Last Year: Not on file  . Ran Out of Food in the Last Year: Not on file  Transportation Needs:   . Lack of Transportation (Medical): Not on file  . Lack of Transportation (Non-Medical): Not on file  Physical Activity:   . Days of Exercise per Week: Not on file  . Minutes of Exercise per Session: Not on file  Stress:   . Feeling of Stress : Not on file  Social Connections:   . Frequency of Communication with  Friends and Family: Not on file  . Frequency of Social Gatherings with Friends and Family: Not on file  . Attends Religious Services: Not on file  . Active Member of Clubs or Organizations: Not on file  . Attends Archivist Meetings: Not on file  . Marital Status: Not on file  Intimate Partner Violence:   . Fear of Current or Ex-Partner: Not on file  . Emotionally Abused: Not on file  . Physically Abused: Not on file  . Sexually Abused: Not on file      Allergies as of 12/31/2019   No Known Allergies     Medication List       Accurate as of December 31, 2019 10:30 AM. If you have any questions, ask your nurse or doctor.        amLODipine 10 MG tablet Commonly known as: NORVASC Take 1 tablet (10 mg total) by mouth daily.   aspirin 81 MG EC tablet Take 1 tablet (81 mg total) by mouth daily.   atenolol 100 MG tablet Commonly known as: TENORMIN Take 1 tablet (100 mg total) by mouth daily.   atorvastatin 20 MG tablet Commonly known as: LIPITOR Take 1 tablet (20 mg total) by mouth at bedtime.   buprenorphine 8 MG Subl SL tablet Commonly known as: SUBUTEX Place 8 mg under the tongue 2 (two) times a day.   clopidogrel 75 MG tablet Commonly known as: PLAVIX Take 1 tablet (75 mg total) by mouth daily.   famotidine 20 MG tablet Commonly known as: Pepcid Take 1 tablet (20 mg total) by mouth 2 (two) times daily.   losartan 100 MG tablet Commonly known as: COZAAR Take 1 tablet (100 mg total) by mouth daily.   multivitamin with minerals Tabs tablet Take 1 tablet by mouth daily.           Objective:   Physical Exam There were no vitals taken for this visit. This is a virtual telephone visit, he is alert oriented x3, in no apparent distress    Assessment     Assessment Prediabetes A1c 6.1 2015 HTN Hyperlipidemia CV: --MRI- brain  02-2018: Remote infarct, remote left basal ganglia and corona radiator perforated infarct -- R endarterectomy  05/2019 GERD Elevated TSH 2014 H/o gout  DJD- hip Chronic back pain + PPD 2000, s/p  antibiotics   PLAN COVID-19: Diagnosed on 12/25/2019. We discussed multiple issues including the fact that he needs to be in a strict quarantine until 01/04/2020. After acute illness he needs to continue following all the CDC guideline precautions He will be eligible for a vaccination 30 days after acute illness. He should continue taking all his medications regularly, be sure she  is well-hydrated and check his ambulatory BPs. He wonders if he could get any long-term damage from this infection and I believe that is unlikely to happen. Also wonders if he could get reinfected and that is unlikely the first 90-day, after that it is unclear thus  continue all precautions.    I discussed the assessment and treatment plan with the patient. The patient was provided an opportunity to ask questions and all were answered. The patient agreed with the plan and demonstrated an understanding of the instructions.   The patient was advised to call back or seek an in-person evaluation if the symptoms worsen or if the condition fails to improve as anticipated.  I provided 22  minutes of non-face-to-face time during this encounter.  Kathlene November, MD

## 2020-01-01 NOTE — Assessment & Plan Note (Signed)
COVID-19: Diagnosed on 12/25/2019. We discussed multiple issues including the fact that he needs to be in a strict quarantine until 01/04/2020. After acute illness he needs to continue following all the CDC guideline precautions He will be eligible for a vaccination 30 days after acute illness. He should continue taking all his medications regularly, be sure she is well-hydrated and check his ambulatory BPs. He wonders if he could get any long-term damage from this infection and I believe that is unlikely to happen. Also wonders if he could get reinfected and that is unlikely the first 90-day, after that it is unclear thus  continue all precautions.

## 2020-01-02 ENCOUNTER — Telehealth: Payer: Self-pay | Admitting: *Deleted

## 2020-01-02 ENCOUNTER — Encounter: Payer: 59 | Admitting: Internal Medicine

## 2020-01-02 ENCOUNTER — Encounter: Payer: 59 | Admitting: Gastroenterology

## 2020-01-02 NOTE — Telephone Encounter (Signed)
Famotidine is not covered under insurance.  Covered alternatives is cimetidine 300mg  or famotidine 40mg /5mg .  Or we can try to submit for

## 2020-01-06 MED ORDER — CIMETIDINE 300 MG PO TABS: 300.0000 mg | ORAL_TABLET | Freq: Two times a day (BID) | ORAL | 6 refills | Status: DC

## 2020-01-06 NOTE — Addendum Note (Signed)
Addended by: Kathlene November E on: 01/06/2020 10:47 AM   Modules accepted: Orders

## 2020-01-06 NOTE — Telephone Encounter (Signed)
Rx for cimetidine sent

## 2020-01-06 NOTE — Telephone Encounter (Signed)
Left detailed message on machine that we had to change again because insurance did not cover the famotidine.

## 2020-02-06 ENCOUNTER — Ambulatory Visit (HOSPITAL_BASED_OUTPATIENT_CLINIC_OR_DEPARTMENT_OTHER)
Admission: RE | Admit: 2020-02-06 | Discharge: 2020-02-06 | Disposition: A | Discharge status: Home / Self Care | Payer: 59 | Source: Ambulatory Visit | Attending: Internal Medicine | Admitting: Internal Medicine

## 2020-02-06 ENCOUNTER — Ambulatory Visit (INDEPENDENT_AMBULATORY_CARE_PROVIDER_SITE_OTHER): Payer: 59 | Admitting: Internal Medicine

## 2020-02-06 ENCOUNTER — Encounter: Payer: Self-pay | Admitting: Internal Medicine

## 2020-02-06 ENCOUNTER — Other Ambulatory Visit: Payer: Self-pay

## 2020-02-06 VITALS — BP 159/79 | HR 58 | Temp 97.7°F | Resp 16 | Ht 72.0 in | Wt 247.4 lb

## 2020-02-06 DIAGNOSIS — I779 Disorder of arteries and arterioles, unspecified: Secondary | ICD-10-CM | POA: Diagnosis not present

## 2020-02-06 DIAGNOSIS — Z125 Encounter for screening for malignant neoplasm of prostate: Secondary | ICD-10-CM

## 2020-02-06 DIAGNOSIS — R42 Dizziness and giddiness: Secondary | ICD-10-CM

## 2020-02-06 DIAGNOSIS — Z0000 Encounter for general adult medical examination without abnormal findings: Secondary | ICD-10-CM

## 2020-02-06 DIAGNOSIS — I10 Essential (primary) hypertension: Secondary | ICD-10-CM | POA: Diagnosis not present

## 2020-02-06 DIAGNOSIS — R739 Hyperglycemia, unspecified: Secondary | ICD-10-CM

## 2020-02-06 DIAGNOSIS — R918 Other nonspecific abnormal finding of lung field: Secondary | ICD-10-CM | POA: Insufficient documentation

## 2020-02-06 LAB — COMPREHENSIVE METABOLIC PANEL
ALT: 73 U/L — ABNORMAL HIGH (ref 0–53)
AST: 43 U/L — ABNORMAL HIGH (ref 0–37)
Albumin: 4.3 g/dL (ref 3.5–5.2)
Alkaline Phosphatase: 138 U/L — ABNORMAL HIGH (ref 39–117)
BUN: 9 mg/dL (ref 6–23)
CO2: 26 mEq/L (ref 19–32)
Calcium: 9.5 mg/dL (ref 8.4–10.5)
Chloride: 105 mEq/L (ref 96–112)
Creatinine, Ser: 0.93 mg/dL (ref 0.40–1.50)
GFR: 102.42 mL/min (ref >60.00)
Glucose, Bld: 104 mg/dL — ABNORMAL HIGH (ref 70–99)
Potassium: 4.4 mEq/L (ref 3.5–5.1)
Sodium: 138 mEq/L (ref 135–145)
Total Bilirubin: 0.8 mg/dL (ref 0.2–1.2)
Total Protein: 7.4 g/dL (ref 6.0–8.3)

## 2020-02-06 LAB — CBC WITH DIFFERENTIAL/PLATELET
Basophils Absolute: 0 10*3/uL (ref 0.0–0.1)
Basophils Relative: 0.7 % (ref 0.0–3.0)
Eosinophils Absolute: 0.3 10*3/uL (ref 0.0–0.7)
Eosinophils Relative: 4 % (ref 0.0–5.0)
HCT: 44.3 % (ref 39.0–52.0)
Hemoglobin: 14.9 g/dL (ref 13.0–17.0)
Lymphocytes Relative: 41.7 % (ref 12.0–46.0)
Lymphs Abs: 2.7 10*3/uL (ref 0.7–4.0)
MCHC: 33.6 g/dL (ref 30.0–36.0)
MCV: 85 fl (ref 78.0–100.0)
Monocytes Absolute: 0.6 10*3/uL (ref 0.1–1.0)
Monocytes Relative: 9.3 % (ref 3.0–12.0)
Neutro Abs: 2.9 10*3/uL (ref 1.4–7.7)
Neutrophils Relative %: 44.3 % (ref 43.0–77.0)
Platelets: 226 10*3/uL (ref 150.0–400.0)
RBC: 5.21 Mil/uL (ref 4.22–5.81)
RDW: 13.9 % (ref 11.5–15.5)
WBC: 6.5 10*3/uL (ref 4.0–10.5)

## 2020-02-06 LAB — PSA: PSA: 1.12 ng/mL (ref 0.10–4.00)

## 2020-02-06 LAB — HEMOGLOBIN A1C: Hgb A1c MFr Bld: 6.3 % (ref 4.6–6.5)

## 2020-02-06 LAB — TSH: TSH: 0.96 u[IU]/mL (ref 0.35–4.50)

## 2020-02-06 MED ORDER — HYDROCHLOROTHIAZIDE 25 MG PO TABS: 25.0000 mg | ORAL_TABLET | Freq: Every day | ORAL | 1 refills | Status: DC

## 2020-02-06 NOTE — Progress Notes (Signed)
Pre visit review using our clinic review tool, if applicable. No additional management support is needed unless otherwise documented below in the visit note. 

## 2020-02-06 NOTE — Progress Notes (Signed)
Subjective:    Patient ID: Mike Shepard, male    DOB: July 12, 1966, 54 y.o.   MRN: [id number]  DOS:  02/06/2020 Type of visit - description: CPX Was diagnosed with Covid few weeks ago, he feels completely recuperated.  Does report a new symptom: Since he had Covid, he is getting dizzy for 2 to 3 minutes on and off, no associated with nausea, headache, diplopia, slurred speech, motor deficit. Symptoms are not necessarily related to moving his head but sometimes when he stands up quickly.  HTN: Not well controlled    BP Readings from Last 3 Encounters:  02/06/20 (!) 159/79  11/28/19 (!) 162/92  08/29/19 (!) 160/73     Review of Systems Denies chest pain, difficulty breathing, edema.  Very rarely has palpitations but notes that the same times he feels dizzy.  Other than above, a 14 point review of systems is negative    Past Medical History:  Diagnosis Date  . Carotid artery stenosis    right  . Chronic back pain    see surgeries   . GERD (gastroesophageal reflux disease)   . H/O: gout   . Hyperlipidemia   . Hypertension   . PPD positive    Positive PPD, remotely ~2000, s/p Rx    . Pre-diabetes   . Primary osteoarthritis of right hip    end stage  . Stroke [facility])    " mild"  . Wears dentures     Past Surgical History:  Procedure Laterality Date  . BACK SURGERY     2007-2013 (low back)  . ENDARTERECTOMY Right 06/02/2019   Procedure: ENDARTERECTOMY CAROTID RIGHT;  Surgeon: Marty Heck, MD;  Location: [place];  Service: Vascular;  Laterality: Right;  . INTRAOPERATIVE ARTERIOGRAM  06/02/2019   Procedure: Right carotid and cerebral angiogram;  Surgeon: Marty Heck, MD;  Location: [place];  Service: Vascular;;  . MULTIPLE TOOTH EXTRACTIONS    . NECK SURGERY     2010  . OPERATIVE ULTRASOUND Right 06/02/2019   Procedure: Operative Ultrasound;  Surgeon: Marty Heck, MD;  [facility] OR;  Service: Vascular;  Laterality: Right;   Family  History  Problem Relation Age of Onset  . Hypertension Mother        M,F,Sis, bro  . Diabetes Father        F, M. B  . Diabetes Sister   . Hypertension Sister   . Diabetes Brother   . Hypertension Brother   . Prostate cancer Paternal Grandfather        dx ~ 29 y/o  . Colon cancer Paternal Uncle   . Anesthesia problems Neg Hx   . CAD Neg Hx   . Stroke Neg Hx     Allergies as of 02/06/2020   No Known Allergies     Medication List       Accurate as of February 06, 2020 11:59 PM. If you have any questions, ask your nurse or doctor.        amLODipine 10 MG tablet Commonly known as: NORVASC Take 1 tablet (10 mg total) by mouth daily.   aspirin 81 MG EC tablet Take 1 tablet (81 mg total) by mouth daily.   atenolol 100 MG tablet Commonly known as: TENORMIN Take 0.5 tablets (50 mg total) by mouth daily. What changed: how much to take Changed by: Kathlene November, MD   atorvastatin 20 MG tablet Commonly known as: LIPITOR Take 1 tablet (20 mg total) by mouth at  bedtime.   buprenorphine 8 MG Subl SL tablet Commonly known as: SUBUTEX Place 8 mg under the tongue 2 (two) times a day.   cimetidine 300 MG tablet Commonly known as: TAGAMET Take 1 tablet (300 mg total) by mouth 2 (two) times daily.   clopidogrel 75 MG tablet Commonly known as: PLAVIX Take 1 tablet (75 mg total) by mouth daily.   hydrochlorothiazide 25 MG tablet Commonly known as: HYDRODIURIL Take 1 tablet (25 mg total) by mouth daily. Started by: Kathlene November, MD   losartan 100 MG tablet Commonly known as: COZAAR Take 1 tablet (100 mg total) by mouth daily.   multivitamin with minerals Tabs tablet Take 1 tablet by mouth daily.             Objective:   Physical Exam BP (!) 159/79 (BP Location: Left Arm, Patient Position: Sitting, Cuff Size: Normal)   Pulse (!) 58   Temp 97.7 F (36.5 C) (Temporal)   Resp 16   Ht 6' (1.829 m)   Wt 247 lb 6 oz (112.2 kg)   SpO2 97%   BMI 33.55 kg/m   General: Well developed, NAD, BMI noted Neck: No  thyromegaly  Right side: Very good carotid pulses, + bruit Left side: Good carotid pulse, no bruit. HEENT:  Normocephalic . Face symmetric, atraumatic Lungs:  Slightly decreased breath sounds at bases Normal respiratory effort, no intercostal retractions, no accessory muscle use. Heart: RRR,  no murmur.  Abdomen:  Not distended, soft, non-tender. No rebound or rigidity.   Lower extremities: no pretibial edema bilaterally  Skin: Exposed areas without rash. Not pale. Not jaundice DRE: No stools found, normal sphincter tone, prostate normal. Neurologic:  alert & oriented X3.  Speech normal, gait appropriate for age and unassisted Strength symmetric and appropriate for age.  Psych: Cognition and judgment appear intact.  Cooperative with normal attention span and concentration.  Behavior appropriate. No anxious or depressed appearing.     Assessment    Assessment DM: A1c 6.5 (08/2019) HTN Hyperlipidemia CV: --MRI- brain  02-2018: Remote infarct, remote left basal ganglia and corona radiator perforated infarct -- R endarterectomy 05/2019 GERD Elevated TSH 2014 H/o gout  DJD- hip Chronic back pain + PPD 2000, s/p  antibiotics   PLAN Here for CPX DM: Check A1c HTN: Not well controlled, ambulatory BPs range from the 140s to the 180s.  BP here is elevated. EKG today sinus bradycardia, rate 48 and he reports dizziness, symptomatic bradycardia? Plan:  continue amlodipine 10 mg, decrease atenolol to 50 mg mg, losartan 100 mg, ADD HCTZ 25 mg daily.  Watch BPs closely, call readings in few days, OV in 2 weeks. Palpitations: Very rarely has palpitations, observation for now Carotid artery disease: C/o dizziness, see HPI, d/t h/o R carotid endarterectomy, issues discussed via message with vascular surgery: due to no significant contralateral carotid disease dizziness not expected to be from carotid disease. Dizziness: Decreasing  beta-blockers, sxs started after Covid, related? Chronic back pain: Apparently he was overusing narcotics, currently on Subutex COVID-19 diagnosed 12/25/2019: See seems fully recuperated, has decreased breath sounds at bases, will check a x-ray. RTC 2 weeks  In addition to CPX, I manage other problems including DM, HTN, carotid artery disease, new symptoms (dizziness).  This visit occurred during the SARS-CoV-2 public health emergency.  Safety protocols were in place, including screening questions prior to the visit, additional usage of staff PPE, and extensive cleaning of exam room while observing appropriate contact time as indicated for disinfecting  solutions.

## 2020-02-06 NOTE — Patient Instructions (Addendum)
GO TO THE LAB : Get the blood work     [place] back for a checkup in 2 weeks, in person, please make an appointment   Decrease atenolol to half tablet daily  Start hydrochlorothiazide, 1 every morning  Other medications the same   Check the  blood pressure daily.  Call with readings in 5 for 6 days BP GOAL is between 110/65 and  135/85.   If you have severe or persistent dizziness: Let me know

## 2020-02-07 NOTE — Assessment & Plan Note (Signed)
Here for CPX DM: Check A1c HTN: Not well controlled, ambulatory BPs range from the 140s to the 180s.  BP here is elevated. EKG today sinus bradycardia, rate 48 and he reports dizziness, symptomatic bradycardia? Plan:  continue amlodipine 10 mg, decrease atenolol to 50 mg mg, losartan 100 mg, ADD HCTZ 25 mg daily.  Watch BPs closely, call readings in few days, OV in 2 weeks. Palpitations: Very rarely has palpitations, observation for now Carotid artery disease: C/o dizziness, see HPI, d/t h/o R carotid endarterectomy, issues discussed via message with vascular surgery: due to no significant contralateral carotid disease dizziness not expected to be from carotid disease. Dizziness: Decreasing beta-blockers, sxs started after Covid, related? Chronic back pain: Apparently he was overusing narcotics, currently on Subutex COVID-19 diagnosed 12/25/2019: See seems fully recuperated, has decreased breath sounds at bases, will check a x-ray. RTC 2 weeks

## 2020-02-07 NOTE — Assessment & Plan Note (Signed)
-  Td 2016 -[facility]: Saw GI 11/28/2019, they recommended a colonoscopy after Plavix washout -prostate ca screening: + FH prostate cancer, GF age 54; DRE normal, check a PSA. -Labs: CMP, CBC, A1c, TSH, PSA --Diet exercise discussed --Tobacco use: +dip, recommend to see dentist regularly, at risk for oral cancer >>> discussed

## 2020-02-20 ENCOUNTER — Ambulatory Visit: Payer: 59 | Admitting: Internal Medicine

## 2020-02-20 ENCOUNTER — Other Ambulatory Visit: Payer: Self-pay

## 2020-02-20 ENCOUNTER — Encounter: Payer: Self-pay | Admitting: Internal Medicine

## 2020-02-20 VITALS — BP 146/64 | HR 65 | Temp 97.5°F | Resp 16 | Ht 72.0 in | Wt 247.4 lb

## 2020-02-20 DIAGNOSIS — I10 Essential (primary) hypertension: Secondary | ICD-10-CM

## 2020-02-20 DIAGNOSIS — E785 Hyperlipidemia, unspecified: Secondary | ICD-10-CM

## 2020-02-20 DIAGNOSIS — R7989 Other specified abnormal findings of blood chemistry: Secondary | ICD-10-CM | POA: Diagnosis not present

## 2020-02-20 DIAGNOSIS — R42 Dizziness and giddiness: Secondary | ICD-10-CM

## 2020-02-20 LAB — COMPREHENSIVE METABOLIC PANEL
ALT: 67 U/L — ABNORMAL HIGH (ref 0–53)
AST: 41 U/L — ABNORMAL HIGH (ref 0–37)
Albumin: 4.2 g/dL (ref 3.5–5.2)
Alkaline Phosphatase: 127 U/L — ABNORMAL HIGH (ref 39–117)
BUN: 15 mg/dL (ref 6–23)
CO2: 26 mEq/L (ref 19–32)
Calcium: 10 mg/dL (ref 8.4–10.5)
Chloride: 101 mEq/L (ref 96–112)
Creatinine, Ser: 1.16 mg/dL (ref 0.40–1.50)
GFR: 79.36 mL/min (ref >60.00)
Glucose, Bld: 126 mg/dL — ABNORMAL HIGH (ref 70–99)
Potassium: 4.4 mEq/L (ref 3.5–5.1)
Sodium: 133 mEq/L — ABNORMAL LOW (ref 135–145)
Total Bilirubin: 0.6 mg/dL (ref 0.2–1.2)
Total Protein: 8.1 g/dL (ref 6.0–8.3)

## 2020-02-20 MED ORDER — CARVEDILOL 25 MG PO TABS: 25.0000 mg | ORAL_TABLET | Freq: Two times a day (BID) | ORAL | 6 refills | Status: DC

## 2020-02-20 NOTE — Progress Notes (Signed)
Pre visit review using our clinic review tool, if applicable. No additional management support is needed unless otherwise documented below in the visit note. 

## 2020-02-20 NOTE — Patient Instructions (Addendum)
GO TO THE LAB : Get the blood work     GO TO [place] back for a checkup in 3  months, please make an appointment   Stop atenolol  Start carvedilol 25 mg 1 tablet twice a day.  If side effects or you feel is too much or too little let me know  Check the  blood pressure 2 or 3 times week BP GOAL is between 110/65 and  135/85. If it is consistently higher or lower, let me know

## 2020-02-20 NOTE — Progress Notes (Signed)
Subjective:    Patient ID: Mike Shepard, male    DOB: 03/25/1966, 53 y.o.   MRN: [id number]  DOS:  02/20/2020 Type of visit - description: Follow-up Since the last office visit, BP meds were adjusted, we review his ambulatory blood pressures. He had also dizziness, since then episodes have improved, had only 1 or 2 episodes, very mild.  Review of Systems See above   Past Medical History:  Diagnosis Date  . Carotid artery stenosis    right  . Chronic back pain    see surgeries   . GERD (gastroesophageal reflux disease)   . H/O: gout   . Hyperlipidemia   . Hypertension   . PPD positive    Positive PPD, remotely ~2000, s/p Rx    . Pre-diabetes   . Primary osteoarthritis of right hip    end stage  . Stroke [facility])    " mild"  . Wears dentures     Past Surgical History:  Procedure Laterality Date  . BACK SURGERY     2007-2013 (low back)  . ENDARTERECTOMY Right 06/02/2019   Procedure: ENDARTERECTOMY CAROTID RIGHT;  Surgeon: Marty Heck, MD;  Location: [place];  Service: Vascular;  Laterality: Right;  . INTRAOPERATIVE ARTERIOGRAM  06/02/2019   Procedure: Right carotid and cerebral angiogram;  Surgeon: Marty Heck, MD;  Location: [place];  Service: Vascular;;  . MULTIPLE TOOTH EXTRACTIONS    . NECK SURGERY     2010  . OPERATIVE ULTRASOUND Right 06/02/2019   Procedure: Operative Ultrasound;  Surgeon: Marty Heck, MD;  Location: [place];  Service: Vascular;  Laterality: Right;    Allergies as of 02/20/2020   No Known Allergies     Medication List       Accurate as of February 20, 2020 11:59 PM. If you have any questions, ask your nurse or doctor.        STOP taking these medications   atenolol 100 MG tablet Commonly known as: TENORMIN Stopped by: Kathlene November, MD     TAKE these medications   amLODipine 10 MG tablet Commonly known as: NORVASC Take 1 tablet (10 mg total) by mouth daily.   aspirin 81 MG EC tablet Take 1 tablet (81 mg total) by  mouth daily.   atorvastatin 20 MG tablet Commonly known as: LIPITOR Take 1 tablet (20 mg total) by mouth at bedtime.   buprenorphine 8 MG Subl SL tablet Commonly known as: SUBUTEX Place 8 mg under the tongue 2 (two) times a day.   carvedilol 25 MG tablet Commonly known as: COREG Take 1 tablet (25 mg total) by mouth 2 (two) times daily with a meal. Started by: Kathlene November, MD   cimetidine 300 MG tablet Commonly known as: TAGAMET Take 1 tablet (300 mg total) by mouth 2 (two) times daily.   clopidogrel 75 MG tablet Commonly known as: PLAVIX Take 1 tablet (75 mg total) by mouth daily.   hydrochlorothiazide 25 MG tablet Commonly known as: HYDRODIURIL Take 1 tablet (25 mg total) by mouth daily.   losartan 100 MG tablet Commonly known as: COZAAR Take 1 tablet (100 mg total) by mouth daily.   multivitamin with minerals Tabs tablet Take 1 tablet by mouth daily.             Objective:   Physical Exam BP (!) 146/64 (BP Location: Left Arm, Patient Position: Sitting, Cuff Size: Normal)   Pulse 65   Temp (!) 97.5 F (36.4 C) (Temporal)  Resp 16   Ht 6' (1.829 m)   Wt 247 lb 6 oz (112.2 kg)   SpO2 99%   BMI 33.55 kg/m  General:   Well developed, NAD, BMI noted. HEENT:  Normocephalic . Face symmetric, atraumatic Lungs:  CTA B Normal respiratory effort, no intercostal retractions, no accessory muscle use. Heart: RRR,  no murmur.  Lower extremities: no pretibial edema bilaterally  Skin: Not pale. Not jaundice Neurologic:  alert & oriented X3.  Speech normal, gait appropriate for age and unassisted Psych--  Cognition and judgment appear intact.  Cooperative with normal attention span and concentration.  Behavior appropriate. No anxious or depressed appearing.      Assessment     Assessment DM: A1c 6.5 (08/2019) HTN Hyperlipidemia CV: --MRI- brain  02-2018: Remote infarct, remote left basal ganglia and corona radiator perforated infarct -- R endarterectomy  05/2019 GERD Elevated TSH 2014 H/o gout  DJD- hip Chronic back pain + PPD 2000, s/p  antibiotics Covid DX 12/25/2019  PLAN DM: Last A1c satisfactory HTN: Currently on amlodipine, HCTZ (added 2 weeks ago), losartan , atenolol decreased 2 weeks ago to 50 mg qd.  BP today slightly elevated, at home range from 135-150.  Diastolic BP 75.  Heart rate has increased to the mid 60s. Plan: Change atenolol to carvedilol 25 mg twice a day, see AVS CMP High cholesterol: Last LFTs a slightly elevated although he has not was not taking Lipitor regularly, now he is.  Rechecking LFTs Dizziness: Improved. RTC 3 months    This visit occurred during the SARS-CoV-2 public health emergency.  Safety protocols were in place, including screening questions prior to the visit, additional usage of staff PPE, and extensive cleaning of exam room while observing appropriate contact time as indicated for disinfecting solutions.

## 2020-02-22 NOTE — Assessment & Plan Note (Addendum)
DM: Last A1c satisfactory HTN: Currently on amlodipine, HCTZ (added 2 weeks ago), losartan , atenolol decreased 2 weeks ago to 50 mg qd.  BP today slightly elevated, at home range from 135-150.  Diastolic BP 75.  Heart rate has increased to the mid 60s. Plan: Change atenolol to carvedilol 25 mg twice a day, see AVS CMP High cholesterol: Last LFTs a slightly elevated although he has not was not taking Lipitor regularly, now he is.  Rechecking LFTs  Dizziness: Improved. RTC 3 months

## 2020-02-23 NOTE — Addendum Note (Signed)
Addended byDamita Dunnings D on: 02/23/2020 10:28 AM   Modules accepted: Orders

## 2020-02-27 ENCOUNTER — Other Ambulatory Visit: Payer: Self-pay

## 2020-02-27 ENCOUNTER — Ambulatory Visit (HOSPITAL_BASED_OUTPATIENT_CLINIC_OR_DEPARTMENT_OTHER)
Admission: RE | Admit: 2020-02-27 | Discharge: 2020-02-27 | Disposition: A | Discharge status: Home / Self Care | Payer: 59 | Source: Ambulatory Visit | Attending: Internal Medicine | Admitting: Internal Medicine

## 2020-02-27 DIAGNOSIS — R7989 Other specified abnormal findings of blood chemistry: Secondary | ICD-10-CM | POA: Insufficient documentation

## 2020-03-23 ENCOUNTER — Other Ambulatory Visit: Payer: Self-pay | Admitting: Internal Medicine

## 2020-03-29 ENCOUNTER — Other Ambulatory Visit: Payer: Self-pay | Admitting: Internal Medicine

## 2020-05-21 ENCOUNTER — Other Ambulatory Visit: Payer: Self-pay

## 2020-05-21 ENCOUNTER — Ambulatory Visit: Payer: 59 | Admitting: Internal Medicine

## 2020-05-21 ENCOUNTER — Encounter: Payer: Self-pay | Admitting: Internal Medicine

## 2020-05-21 VITALS — BP 148/80 | HR 65 | Temp 97.1°F | Resp 18 | Ht 72.0 in | Wt 247.5 lb

## 2020-05-21 DIAGNOSIS — E119 Type 2 diabetes mellitus without complications: Secondary | ICD-10-CM

## 2020-05-21 DIAGNOSIS — I6521 Occlusion and stenosis of right carotid artery: Secondary | ICD-10-CM

## 2020-05-21 DIAGNOSIS — I10 Essential (primary) hypertension: Secondary | ICD-10-CM

## 2020-05-21 DIAGNOSIS — R7989 Other specified abnormal findings of blood chemistry: Secondary | ICD-10-CM

## 2020-05-21 LAB — COMPREHENSIVE METABOLIC PANEL
ALT: 42 U/L (ref 0–53)
AST: 26 U/L (ref 0–37)
Albumin: 4.2 g/dL (ref 3.5–5.2)
Alkaline Phosphatase: 115 U/L (ref 39–117)
BUN: 15 mg/dL (ref 6–23)
CO2: 29 mEq/L (ref 19–32)
Calcium: 9.7 mg/dL (ref 8.4–10.5)
Chloride: 100 mEq/L (ref 96–112)
Creatinine, Ser: 0.97 mg/dL (ref 0.40–1.50)
GFR: 97.46 mL/min (ref >60.00)
Glucose, Bld: 140 mg/dL — ABNORMAL HIGH (ref 70–99)
Potassium: 3.9 mEq/L (ref 3.5–5.1)
Sodium: 136 mEq/L (ref 135–145)
Total Bilirubin: 0.8 mg/dL (ref 0.2–1.2)
Total Protein: 7.3 g/dL (ref 6.0–8.3)

## 2020-05-21 LAB — HEMOGLOBIN A1C: Hgb A1c MFr Bld: 6.1 % (ref 4.6–6.5)

## 2020-05-21 MED ORDER — CARVEDILOL 25 MG PO TABS: 25.0000 mg | ORAL_TABLET | Freq: Two times a day (BID) | ORAL | 1 refills | Status: DC

## 2020-05-21 NOTE — Progress Notes (Signed)
Subjective:    Patient ID: Mike Shepard, male    DOB: May 07, 1966, 54 y.o.   MRN: [id number]  DOS:  05/21/2020 Type of visit - description: Follow-up Since the last office visit he is doing great. We talk about high blood pressure, increase LFTs, fatty liver, diet and exercise. Also he has a new problem: Left-sided tinnitus, on and off, this arrived as a pulsatile feeling or sound.  Worse at rest.   Review of Systems Dizziness is resolved Does not drink EtOH or takes Tylenol daily.  Past Medical History:  Diagnosis Date  . Carotid artery stenosis    right  . Chronic back pain    see surgeries   . GERD (gastroesophageal reflux disease)   . H/O: gout   . Hyperlipidemia   . Hypertension   . PPD positive    Positive PPD, remotely ~2000, s/p Rx    . Pre-diabetes   . Primary osteoarthritis of right hip    end stage  . Stroke [facility])    " mild"  . Wears dentures     Past Surgical History:  Procedure Laterality Date  . BACK SURGERY     2007-2013 (low back)  . ENDARTERECTOMY Right 06/02/2019   Procedure: ENDARTERECTOMY CAROTID RIGHT;  Surgeon: Marty Heck, MD;  Location: [place];  Service: Vascular;  Laterality: Right;  . INTRAOPERATIVE ARTERIOGRAM  06/02/2019   Procedure: Right carotid and cerebral angiogram;  Surgeon: Marty Heck, MD;  Location: [place];  Service: Vascular;;  . MULTIPLE TOOTH EXTRACTIONS    . NECK SURGERY     2010  . OPERATIVE ULTRASOUND Right 06/02/2019   Procedure: Operative Ultrasound;  Surgeon: Marty Heck, MD;  Location: [place];  Service: Vascular;  Laterality: Right;    Allergies as of 05/21/2020   No Known Allergies     Medication List       Accurate as of May 21, 2020 11:59 PM. If you have any questions, ask your nurse or doctor.        amLODipine 10 MG tablet Commonly known as: NORVASC Take 1 tablet (10 mg total) by mouth daily.   aspirin 81 MG EC tablet Take 1 tablet (81 mg total) by mouth daily.     atorvastatin 20 MG tablet Commonly known as: LIPITOR Take 1 tablet (20 mg total) by mouth at bedtime.   buprenorphine 8 MG Subl SL tablet Commonly known as: SUBUTEX Place 8 mg under the tongue 2 (two) times a day.   carvedilol 25 MG tablet Commonly known as: COREG Take 1 tablet (25 mg total) by mouth 2 (two) times daily with a meal.   cimetidine 300 MG tablet Commonly known as: TAGAMET Take 1 tablet (300 mg total) by mouth 2 (two) times daily.   clopidogrel 75 MG tablet Commonly known as: PLAVIX Take 1 tablet (75 mg total) by mouth daily.   hydrochlorothiazide 25 MG tablet Commonly known as: HYDRODIURIL Take 1 tablet (25 mg total) by mouth daily.   losartan 100 MG tablet Commonly known as: COZAAR Take 1 tablet (100 mg total) by mouth daily.   multivitamin with minerals Tabs tablet Take 1 tablet by mouth daily.          Objective:   Physical Exam BP (!) 148/80 (BP Location: Left Arm, Patient Position: Sitting, Cuff Size: Normal)   Pulse 65   Temp (!) 97.1 F (36.2 C) (Temporal)   Resp 18   Ht 6' (1.829 m)   [facility]  247 lb 8 oz (112.3 kg)   SpO2 99%   BMI 33.57 kg/m  General:   Well developed, NAD, BMI noted. HEENT:  Normocephalic . Face symmetric, atraumatic Ears: TMs partially visualized due to wax, they seem normal Neck: Palpable carotid pulses bilaterally, + bruit R>L Lungs:  CTA B Normal respiratory effort, no intercostal retractions, no accessory muscle use. Heart: RRR,  no murmur.  Lower extremities: no pretibial edema bilaterally  Skin: Not pale. Not jaundice Neurologic:  alert & oriented X3.  Speech normal, gait appropriate for age and unassisted Psych--  Cognition and judgment appear intact.  Cooperative with normal attention span and concentration.  Behavior appropriate. No anxious or depressed appearing.      Assessment     Assessment DM: A1c 6.5 (08/2019) HTN Hyperlipidemia CV: --MRI- brain  02-2018: Remote infarct, remote left basal  ganglia and corona radiator perforated infarct -- R endarterectomy 05/2019 GERD Elevated TSH 2014 H/o gout  DJD- hip Chronic back pain + PPD 2000, s/p  antibiotics Covid DX 12/25/2019  PLAN DM: Last A1c 6.3, currently on diet control. Doing well with diet, encouraged to increase physical activity. HTN: Currently on amlodipine, carvedilol, HCTZ and losartan.  Ambulatory BPs in the mornings 135/75, in the afternoons are typically 5-10 points higher. We will check a CMP, for now no change in regimen, he is close to goal. High cholesterol: On Lipitor, last FLP satisfactory Increase LFTs: Problem noted 04/2019, coincides with initiation of statins, alkaline phosphate was also slightly elevated,Ultrasound 02/27/2020 showed fatty liver. No drinking EtOH, no Tylenol, increase LFTs probably related to statin however  statin benefit >> risk. Check hepatitis serology, TIBC for completeness.. Tinnitus, left-sided: Related to carotid artery disease?  See next Carotid artery disease: Due to see Dr. Carlis Abbott , referral sent, if tinnitus not felt to be related to carotid artery disease will refer to ENT. RTC 3 months  This visit occurred during the SARS-CoV-2 public health emergency.  Safety protocols were in place, including screening questions prior to the visit, additional usage of staff PPE, and extensive cleaning of exam room while observing appropriate contact time as indicated for disinfecting solutions.

## 2020-05-21 NOTE — Patient Instructions (Signed)
Continue checking your blood pressures BP GOAL is between 110/65 and  135/85. If it is consistently higher or lower, let me know  Start a walking program, goal 30 minutes 3 times a week  GO TO THE LAB : Get the blood work     [place], [place] back for for a checkup in 3 months

## 2020-05-21 NOTE — Progress Notes (Signed)
Pre visit review using our clinic review tool, if applicable. No additional management support is needed unless otherwise documented below in the visit note. 

## 2020-05-23 NOTE — Assessment & Plan Note (Signed)
DM: Last A1c 6.3, currently on diet control. Doing well with diet, encouraged to increase physical activity. HTN: Currently on amlodipine, carvedilol, HCTZ and losartan.  Ambulatory BPs in the mornings 135/75, in the afternoons are typically 5-10 points higher. We will check a CMP, for now no change in regimen, he is close to goal. High cholesterol: On Lipitor, last FLP satisfactory Increase LFTs: Problem noted 04/2019, coincides with initiation of statins, alkaline phosphate was also slightly elevated,Ultrasound 02/27/2020 showed fatty liver. No drinking EtOH, no Tylenol, increase LFTs probably related to statin however  statin benefit >> risk. Check hepatitis serology, TIBC for completeness.. Tinnitus, left-sided: Related to carotid artery disease?  See next Carotid artery disease: Due to see Dr. Carlis Abbott , referral sent, if tinnitus not felt to be related to carotid artery disease will refer to ENT. RTC 3 months

## 2020-05-24 LAB — IRON,TIBC AND FERRITIN PANEL
%SAT: 32 % (calc) (ref 20–48)
Ferritin: 63 ng/mL (ref 38–380)
Iron: 99 ug/dL (ref 50–180)
TIBC: 313 mcg/dL (calc) (ref 250–425)

## 2020-05-24 LAB — HEPATITIS B CORE ANTIBODY, TOTAL: Hep B Core Total Ab: NONREACTIVE

## 2020-05-24 LAB — HEPATITIS B SURFACE ANTIBODY,QUALITATIVE: Hep B S Ab: NONREACTIVE

## 2020-06-24 ENCOUNTER — Encounter: Payer: Self-pay | Admitting: Internal Medicine

## 2020-07-15 ENCOUNTER — Other Ambulatory Visit: Payer: Self-pay

## 2020-07-15 DIAGNOSIS — I6529 Occlusion and stenosis of unspecified carotid artery: Secondary | ICD-10-CM

## 2020-07-27 ENCOUNTER — Encounter: Payer: Self-pay | Admitting: Vascular Surgery

## 2020-07-27 ENCOUNTER — Ambulatory Visit: Payer: 59 | Admitting: Vascular Surgery

## 2020-07-27 ENCOUNTER — Ambulatory Visit (HOSPITAL_COMMUNITY)
Admission: RE | Admit: 2020-07-27 | Discharge: 2020-07-27 | Disposition: A | Discharge status: Home / Self Care | Payer: 59 | Source: Ambulatory Visit | Attending: Surgery | Admitting: Surgery

## 2020-07-27 ENCOUNTER — Other Ambulatory Visit: Payer: Self-pay

## 2020-07-27 VITALS — BP 153/91 | HR 64 | Temp 97.8°F | Resp 20 | Ht 72.0 in | Wt 251.0 lb

## 2020-07-27 DIAGNOSIS — I6529 Occlusion and stenosis of unspecified carotid artery: Secondary | ICD-10-CM | POA: Insufficient documentation

## 2020-07-27 DIAGNOSIS — I6523 Occlusion and stenosis of bilateral carotid arteries: Secondary | ICD-10-CM | POA: Diagnosis not present

## 2020-07-27 NOTE — Progress Notes (Signed)
Patient name: Mike Shepard MRN: [id number] DOB: 12-04-1966 Sex: male  REASON FOR VISIT: 31-month follow-up after right carotid endarterectomy  HPI: Mike Shepard is a 54 y.o. male that presents for 2-month follow-up after right carotid endarterectomy on 06/02/2019 for an asymptomatic high-grade stenosis.  At the time of his carotid endarterectomy I had a thump signal in the ICA at completion as also noted on preoperative duplex.  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  He reports no issues since I last saw him 9 months ago.  He remains on aspirin Plavix.  Remains neurologically intact.  His only concern is he has some ringing in the left ear which is opposite the side we did his carotid endarterectomy.    Past Medical History:  Diagnosis Date  . Carotid artery stenosis    right  . Chronic back pain    see surgeries   . GERD (gastroesophageal reflux disease)   . H/O: gout   . Hyperlipidemia   . Hypertension   . PPD positive    Positive PPD, remotely ~2000, s/p Rx    . Pre-diabetes   . Primary osteoarthritis of right hip    end stage  . Stroke [facility])    " mild"  . Wears dentures     Past Surgical History:  Procedure Laterality Date  . BACK SURGERY     2007-2013 (low back)  . ENDARTERECTOMY Right 06/02/2019   Procedure: ENDARTERECTOMY CAROTID RIGHT;  Surgeon: Marty Heck, MD;  Location: [place];  Service: Vascular;  Laterality: Right;  . INTRAOPERATIVE ARTERIOGRAM  06/02/2019   Procedure: Right carotid and cerebral angiogram;  Surgeon: Marty Heck, MD;  Location: [place];  Service: Vascular;;  . MULTIPLE TOOTH EXTRACTIONS    . NECK SURGERY     2010  . OPERATIVE ULTRASOUND Right 06/02/2019   Procedure: Operative Ultrasound;  Surgeon: Marty Heck, MD;  Location: [facility];   Service: Vascular;  Laterality: Right;    Family History  Problem Relation Age of Onset  . Hypertension Mother        M,F,Sis, bro  . Diabetes Father        F, M. B  . Diabetes Sister   . Hypertension Sister   . Diabetes Brother   . Hypertension Brother   . Prostate cancer Paternal Grandfather        dx ~ 78 y/o  . Colon cancer Paternal Uncle   . Anesthesia problems Neg Hx   . CAD Neg Hx   . Stroke Neg Hx     SOCIAL HISTORY: Social History   Tobacco Use  . Smoking status: Former Smoker    Types: Cigarettes    Quit date: 01/27/2010    Years since quitting: 10.5  . Smokeless tobacco: Current User    Types: Chew  . Tobacco comment: Dips, rec to see dentist!  Substance Use Topics  . Alcohol use: Not Currently    No Known Allergies  Current Outpatient Medications  Medication Sig Dispense Refill  . amLODipine (NORVASC) 10 MG tablet Take 1 tablet (10 mg total) by mouth daily. 90 tablet 1  . aspirin EC 81 MG EC tablet Take 1 tablet (81 mg total) by mouth daily.    [place] atorvastatin (LIPITOR) 20 MG tablet Take 1 tablet (20 mg total) by mouth  at bedtime. 90 tablet 1  . buprenorphine (SUBUTEX) 8 MG SUBL SL tablet Place 8 mg under the tongue 2 (two) times a day.     . carvedilol (COREG) 25 MG tablet Take 1 tablet (25 mg total) by mouth 2 (two) times daily with a meal. 180 tablet 1  . cimetidine (TAGAMET) 300 MG tablet Take 1 tablet (300 mg total) by mouth 2 (two) times daily. 60 tablet 6  . clopidogrel (PLAVIX) 75 MG tablet Take 1 tablet (75 mg total) by mouth daily. 90 tablet 1  . hydrochlorothiazide (HYDRODIURIL) 25 MG tablet Take 1 tablet (25 mg total) by mouth daily. 30 tablet 3  . losartan (COZAAR) 100 MG tablet Take 1 tablet (100 mg total) by mouth daily. 90 tablet 1  . Multiple Vitamin (MULTIVITAMIN WITH MINERALS) TABS Take 1 tablet by mouth daily.     No current facility-administered medications for this visit.    REVIEW OF SYSTEMS:  [X]  denotes positive finding, [ ]   denotes negative finding Cardiac  Comments:  Chest pain or chest pressure:    Shortness of breath upon exertion:    Short of breath when lying flat:    Irregular heart rhythm:        Vascular    Pain in calf, thigh, or hip brought on by ambulation:    Pain in feet at night that wakes you up from your sleep:     Blood clot in your veins:    Leg swelling:         Pulmonary    Oxygen at home:    Productive cough:     Wheezing:         Neurologic    Sudden weakness in arms or legs:     Sudden numbness in arms or legs:     Sudden onset of difficulty speaking or slurred speech:    Temporary loss of vision in one eye:     Problems with dizziness:         Gastrointestinal    Blood in stool:     Vomited blood:         Genitourinary    Burning when urinating:     Blood in urine:        Psychiatric    Major depression:         Hematologic    Bleeding problems:    Problems with blood clotting too easily:        Skin    Rashes or ulcers:        Constitutional    Fever or chills:      PHYSICAL EXAM: Vitals:   07/27/20 1332 07/27/20 1335  BP: (!) 156/97 (!) 153/91  Pulse: 64   Resp: 20   Temp: 97.8 F (36.6 C)   SpO2: 100%   Weight: 251 lb (113.9 kg)   Height: 6' (1.829 m)     GENERAL: The patient is a well-nourished male, in no acute distress. The vital signs are documented above. CARDIAC: There is a regular rate and rhythm.  VASCULAR:  Right neck incision well healed CN II-XII grossly intact No other appreciable neurologic deficits   DATA:   Carotid duplex today shows right ICA is occluded and left ICA has minimal 1 to 39% stenosis.  Assessment/Plan:  54 year old male that presents for ongoing surveillance after right carotid endarterectomy on 06/02/2019 for an asymptomatic high-grade stenosis.  On duplex today his right ICA is now occluded and I suspect this is  from his tandem intracranial disease with limited outflow.  As previously noted at the time of  his carotid endarterectomy we had a thumping signal in the ICA that was noted on preoperative duplex and an intraoperative angiogram was obtained that showed a patent carotid endarterectomy site and a patent ICA up to the skull base but he had multiple tandem high-grade intracranial lesions distally.  He remains neurologically intact and discussed that once the ICA is occluded we do not make any effort to revascularize that given there is no benefit at this time.  We will follow his left carotid which has minimal 1 to 39% stenosis.  We discussed that he will have other collateral pathways in the setting of right ICA occlusion and he should hopefully do well from a clinical standpoint given has remained neurologically intact during right ICA occlusion.  Marty Heck, MD [facility] Office: [phone]

## 2020-08-02 ENCOUNTER — Other Ambulatory Visit: Payer: Self-pay | Admitting: Internal Medicine

## 2020-08-20 ENCOUNTER — Ambulatory Visit: Payer: 59 | Admitting: Internal Medicine

## 2020-08-27 ENCOUNTER — Ambulatory Visit (INDEPENDENT_AMBULATORY_CARE_PROVIDER_SITE_OTHER): Payer: 59 | Admitting: Internal Medicine

## 2020-08-27 ENCOUNTER — Other Ambulatory Visit: Payer: Self-pay

## 2020-08-27 ENCOUNTER — Encounter: Payer: Self-pay | Admitting: Internal Medicine

## 2020-08-27 VITALS — BP 170/99 | HR 73 | Temp 98.1°F | Resp 16 | Ht 72.0 in | Wt 253.2 lb

## 2020-08-27 DIAGNOSIS — I10 Essential (primary) hypertension: Secondary | ICD-10-CM

## 2020-08-27 DIAGNOSIS — E785 Hyperlipidemia, unspecified: Secondary | ICD-10-CM | POA: Diagnosis not present

## 2020-08-27 DIAGNOSIS — R002 Palpitations: Secondary | ICD-10-CM | POA: Diagnosis not present

## 2020-08-27 DIAGNOSIS — R7989 Other specified abnormal findings of blood chemistry: Secondary | ICD-10-CM

## 2020-08-27 DIAGNOSIS — Z0189 Encounter for other specified special examinations: Secondary | ICD-10-CM

## 2020-08-27 MED ORDER — VALSARTAN-HYDROCHLOROTHIAZIDE 320-25 MG PO TABS: 1.0000 | ORAL_TABLET | Freq: Every day | ORAL | 1 refills | Status: DC

## 2020-08-27 NOTE — Progress Notes (Signed)
Pre visit review using our clinic review tool, if applicable. No additional management support is needed unless otherwise documented below in the visit note. 

## 2020-08-27 NOTE — Patient Instructions (Addendum)
Per our records you are due for an eye exam. Please contact your eye doctor to schedule an appointment. Please have them send copies of your office visit notes to [place]. Our fax number is [phone].   Stop losartan  Stop hydrochlorothiazide  Start valsartan 320-25 mg: 1 tablet in the morning  Other medications the same   Check the  blood pressure 2 or 3 times a week BP GOAL is between 110/65 and  135/85. If it is consistently higher or lower, let me know  We are referring you to cardiology  [place], [place] back for blood work in 2 weeks, make a lab appointment  Come back for a checkup in 3 months delete     [place], CHOLESTEROL, HIGH BLOOD PRESSURE, QUIT TOBACCO (if you smoke):  The American Heart Association, www.heart.org  Check the "Life's Simple 7" from the [facility] The American diabetes Association www.diabetes.org  "[facility] [place]" book

## 2020-08-27 NOTE — Progress Notes (Signed)
Subjective:    Patient ID: Mike Shepard, male    DOB: May 08, 1966, 54 y.o.   MRN: [id number]  DOS:  08/27/2020 Type of visit - description: Follow-up Several issues discussed 2 months ago developed palpitations, may happen once or twice a day, last few seconds only.  No associated chest pain or difficulty breathing.  Typically at rest.  HTN: Good med compliance, not well controlled. Diet does not seem to be  healthy.  Carotid artery disease: Note from vascular surgery reviewed.    Review of Systems I asked about sxs of  sleep apnea, he reports as his wife has noticed some snoring and occasionally apnea Denies any morning headaches, energy level is okay but admits to feeling occasionally sleepy.  Past Medical History:  Diagnosis Date  . Carotid artery stenosis    right  . Chronic back pain    see surgeries   . GERD (gastroesophageal reflux disease)   . H/O: gout   . Hyperlipidemia   . Hypertension   . PPD positive    Positive PPD, remotely ~2000, s/p Rx    . Pre-diabetes   . Primary osteoarthritis of right hip    end stage  . Stroke [facility])    " mild"  . Wears dentures     Past Surgical History:  Procedure Laterality Date  . BACK SURGERY     2007-2013 (low back)  . ENDARTERECTOMY Right 06/02/2019   Procedure: ENDARTERECTOMY CAROTID RIGHT;  Surgeon: Marty Heck, MD;  Location: [place];  Service: Vascular;  Laterality: Right;  . INTRAOPERATIVE ARTERIOGRAM  06/02/2019   Procedure: Right carotid and cerebral angiogram;  Surgeon: Marty Heck, MD;  Location: [place];  Service: Vascular;;  . MULTIPLE TOOTH EXTRACTIONS    . NECK SURGERY     2010  . OPERATIVE ULTRASOUND Right 06/02/2019   Procedure: Operative Ultrasound;  Surgeon: Marty Heck, MD;  Location: [place];  Service: Vascular;  Laterality: Right;    Allergies as of 08/27/2020   No Known Allergies     Medication List       Accurate as of August 27, 2020 11:59 PM. If you have any  questions, ask your nurse or doctor.        STOP taking these medications   hydrochlorothiazide 25 MG tablet Commonly known as: HYDRODIURIL Stopped by: Kathlene November, MD   losartan 100 MG tablet Commonly known as: COZAAR Stopped by: Kathlene November, MD     TAKE these medications   amLODipine 10 MG tablet Commonly known as: NORVASC Take 1 tablet (10 mg total) by mouth daily.   aspirin 81 MG EC tablet Take 1 tablet (81 mg total) by mouth daily.   atorvastatin 20 MG tablet Commonly known as: LIPITOR Take 1 tablet (20 mg total) by mouth at bedtime.   buprenorphine 8 MG Subl SL tablet Commonly known as: SUBUTEX Place 8 mg under the tongue 2 (two) times a day.   carvedilol 25 MG tablet Commonly known as: COREG Take 1 tablet (25 mg total) by mouth 2 (two) times daily with a meal.   cimetidine 300 MG tablet Commonly known as: TAGAMET Take 1 tablet (300 mg total) by mouth 2 (two) times daily.   clopidogrel 75 MG tablet Commonly known as: PLAVIX Take 1 tablet (75 mg total) by mouth daily.   multivitamin with minerals Tabs tablet Take 1 tablet by mouth daily.   valsartan-hydrochlorothiazide 320-25 MG tablet Commonly known as: DIOVAN-HCT Take 1 tablet by  mouth daily. Started by: Kathlene November, MD          Objective:   Physical Exam BP (!) 170/99 (BP Location: Left Arm, Patient Position: Sitting, Cuff Size: Normal)   Pulse 73   Temp 98.1 F (36.7 C) (Oral)   Resp 16   Ht 6' (1.829 m)   Wt 253 lb 4 oz (114.9 kg)   SpO2 97%   BMI 34.35 kg/m      General:   Well developed, NAD, BMI noted.  HEENT:  Normocephalic . Face symmetric, atraumatic.  Somewhat crowded throat. Lungs:  CTA B Normal respiratory effort, no intercostal retractions, no accessory muscle use. Heart: RRR,  no murmur.  Abdomen:  Not distended, soft, non-tender. No rebound or rigidity.   Skin: Not pale. Not jaundice Lower extremities: no pretibial edema bilaterally  Neurologic:  alert & oriented X3.  Speech  normal, gait appropriate for age and unassisted Psych--  Cognition and judgment appear intact.  Cooperative with normal attention span and concentration.  Behavior appropriate. No anxious or depressed appearing.  Assessment      Assessment DM: A1c 6.5 (08/2019) HTN Hyperlipidemia CV: --MRI- brain  02-2018: Remote infarct, remote left basal ganglia and corona radiator perforated infarct -- R endarterectomy 05/2019 GERD Elevated TSH 2014 H/o gout  DJD- hip Chronic back pain + PPD 2000, s/p  antibiotics Covid DX 12/25/2019 Increase LFTs: noted 04/2019, coincide w/ statin RX, AP slightly elevated.U/S 02/2020: Fatty liver. 05/2020: Hep B negative, iron normal.   PLAN DM: Diet controlled, last A1c very good. HTN: Not well controlled.  Amb BPs range from 150/90-160 5/90.  Reports good compliance with amlodipine, carvedilol, HCTZ, losartan. Plan: Stop losartan & HCTZ.  Start valsartan HCT 320-25 : one po daily, BMP in 2 weeks.  Monitor BPs High cholesterol: On Lipitor, check FLP in 2 weeks OSA?   -Highly suspected due to: sxs, difficult to control BP and Epworth scale of 11.   -Referring to cardiology for evaluation of OSA, palpitations poorly controlled BP (probably from OSA) Palpitations: EKG today, NSR, referring to cards Increase LFTs:  Last labs satisfactory (will add a hep C for completeness) Patient education:  Diet does not seem to be healthy (today he had eggs, bacon and cheese cake!) Counseled, advised self education, see AVS RTC 3 months  This visit occurred during the SARS-CoV-2 public health emergency.  Safety protocols were in place, including screening questions prior to the visit, additional usage of staff PPE, and extensive cleaning of exam room while observing appropriate contact time as indicated for disinfecting solutions.

## 2020-08-29 NOTE — Assessment & Plan Note (Signed)
DM: Diet controlled, last A1c very good. HTN: Not well controlled.  Amb BPs range from 150/90-160 5/90.  Reports good compliance with amlodipine, carvedilol, HCTZ, losartan. Plan: Stop losartan & HCTZ.  Start valsartan HCT 320-25 : one po daily, BMP in 2 weeks.  Monitor BPs High cholesterol: On Lipitor, check FLP in 2 weeks OSA?   -Highly suspected due to: sxs, difficult to control BP and Epworth scale of 11.   -Referring to cardiology for evaluation of OSA, palpitations poorly controlled BP (probably from OSA) Palpitations: EKG today, NSR, referring to cards Increase LFTs:  Last labs satisfactory (will add a hep C for completeness) Patient education:  Diet does not seem to be healthy (today he had eggs, bacon and cheese cake!) Counseled, advised self education, see AVS RTC 3 months

## 2020-09-17 ENCOUNTER — Other Ambulatory Visit: Payer: 59

## 2020-09-22 ENCOUNTER — Encounter: Payer: Self-pay | Admitting: Internal Medicine

## 2020-09-28 ENCOUNTER — Other Ambulatory Visit: Payer: Self-pay | Admitting: Internal Medicine

## 2020-09-30 ENCOUNTER — Ambulatory Visit: Payer: 59 | Admitting: Cardiology

## 2020-10-02 ENCOUNTER — Other Ambulatory Visit: Payer: Self-pay | Admitting: Internal Medicine

## 2020-10-05 ENCOUNTER — Telehealth: Payer: Self-pay | Admitting: Internal Medicine

## 2020-10-05 NOTE — Telephone Encounter (Signed)
I mailed a letter to the Pt regarding this on 09/22/2020.

## 2020-10-05 NOTE — Telephone Encounter (Signed)
Ok thx.

## 2020-10-05 NOTE — Telephone Encounter (Signed)
Patient is overdue for labs ordered at the time of the last visit, please arrange

## 2020-10-08 ENCOUNTER — Other Ambulatory Visit: Payer: 59

## 2020-10-21 ENCOUNTER — Ambulatory Visit: Payer: 59 | Admitting: Cardiology

## 2020-10-26 ENCOUNTER — Other Ambulatory Visit: Payer: Self-pay | Admitting: Internal Medicine

## 2020-10-31 ENCOUNTER — Other Ambulatory Visit: Payer: Self-pay | Admitting: Internal Medicine

## 2020-11-09 ENCOUNTER — Other Ambulatory Visit: Payer: Self-pay | Admitting: Internal Medicine

## 2020-11-19 ENCOUNTER — Other Ambulatory Visit: Payer: Self-pay | Admitting: Internal Medicine

## 2020-11-19 ENCOUNTER — Ambulatory Visit (INDEPENDENT_AMBULATORY_CARE_PROVIDER_SITE_OTHER): Payer: 59 | Admitting: Gastroenterology

## 2020-11-19 ENCOUNTER — Telehealth: Payer: Self-pay

## 2020-11-19 ENCOUNTER — Encounter: Payer: Self-pay | Admitting: Gastroenterology

## 2020-11-19 VITALS — BP 142/100 | HR 64 | Ht 72.0 in | Wt 248.0 lb

## 2020-11-19 DIAGNOSIS — Z1211 Encounter for screening for malignant neoplasm of colon: Secondary | ICD-10-CM

## 2020-11-19 MED ORDER — SUPREP BOWEL PREP KIT 17.5-3.13-1.6 GM/177ML PO SOLN: 1.0000 | ORAL | 0 refills | Status: DC

## 2020-11-19 NOTE — Telephone Encounter (Signed)
[facility] Pre-operative Risk Assessment     Request for surgical clearance:     Endoscopy Procedure  What type of surgery is being performed?     Colonoscopy  When is this surgery scheduled?     11/25/20  What type of clearance is required ?   Pharmacy  Are there any medications that need to be held prior to surgery and how long? Hold Plavix for 5 days  Practice name and name of physician performing surgery?      [facility]  What is your office phone and fax number?      Phone- [phone]  [phone]  Anesthesia type (None, local, MAC, general) ?       MAC

## 2020-11-19 NOTE — Progress Notes (Signed)
[name]   Referring Provider: Colon Branch, MD Primary Care Physician:  Colon Branch, MD  Chief complaint:  Colon cancer screening   IMPRESSION:  Need for colon cancer screening Plavix for carotid artery disease (prescribed by Dr. Larose Kells) Heartburn controlled on omeprazole 20 mg every morning Mike Shepard with colon polyps No known family history of colon cancer  Need for colon cancer screening: Colonoscopy recommended for colon cancer screening.  Will need to hold Plavix for 5 days before endoscopy.  I discussed with the patient that there is a low, but real, risk of a cardiovascular event such as heart attack, stroke, or embolism/thrombosis while off Plavix. Will communicate with Dr. Larose Kells to confirm that holding the Plavix is appropriate at this time.   Heartburn: No alarm features.  Symptoms are adequately controlled on omeprazole 20 mg every morning.  PLAN: Colonoscopy after Plavix washout  Please see the "Patient Instructions" section for addition details about the plan.  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  He has a history of hypertension, prediabetes, hypercholesterolemia, carotid artery disease requiring endarterectomy 06/02/19. He is on longterm Plavix given tandem intracranial lesions noted on his operative report.   He was originally seen in consultation 11/28/19. Colonoscopy planned at that time but not performed due to Covid.   He has heartburn is well controlled on omeprazole 20 mg QAM. There is no dysphagia, odynophagia, regurgitation, nausea, abdominal pain, change in bowel habits, melena, hematochezia, or bright red blood per rectum. There has been no recent anorexia or recent change in weight.   Mike Shepard with colon polyps. Mike Shepard with colon polyps. Paternal uncle died of colon cancer at age 71. Mike Shepard with Crohn's disease. No other known family history of colon cancer or polyps. No family history of uterine/endometrial cancer,  pancreatic cancer or gastric/stomach cancer.   Past Medical History:  Diagnosis Date  . Carotid artery stenosis    right  . Chronic back pain    see surgeries   . GERD (gastroesophageal reflux disease)   . H/O: gout   . Hyperlipidemia   . Hypertension   . PPD positive    Positive PPD, remotely ~2000, s/p Rx    . Pre-diabetes   . Primary osteoarthritis of right hip    end stage  . Stroke [facility])    " mild"  . Wears dentures     Past Surgical History:  Procedure Laterality Date  . BACK SURGERY     2007-2013 (low back)  . ENDARTERECTOMY Right 06/02/2019   Procedure: ENDARTERECTOMY CAROTID RIGHT;  Surgeon: Marty Heck, MD;  Location: [facility];  Service: Vascular;  Laterality: Right;  . INTRAOPERATIVE ARTERIOGRAM  06/02/2019   Procedure: Right carotid and cerebral angiogram;  Surgeon: Marty Heck, MD;  Location: [facility];  Service: Vascular;;  . MULTIPLE TOOTH EXTRACTIONS    . NECK SURGERY     2010  . OPERATIVE ULTRASOUND Right 06/02/2019   Procedure: Operative Ultrasound;  Surgeon: Marty Heck, MD;  Location: [facility];  Service: Vascular;  Laterality: Right;    Current Outpatient Medications  Medication Sig Dispense Refill  . amLODipine (NORVASC) 10 MG tablet TAKE 1 TABLET BY MOUTH  DAILY 90 tablet 3  . aspirin EC 81 MG EC tablet Take 1 tablet (81 mg total) by mouth daily.    Marland Kitchen atorvastatin (LIPITOR) 20 MG tablet Take 1 tablet (20 mg total) by mouth at bedtime. 90 tablet 1  .  buprenorphine (SUBUTEX) 8 MG SUBL SL tablet Place 8 mg under the tongue 2 (two) times a day.     . carvedilol (COREG) 25 MG tablet Take 1 tablet (25 mg total) by mouth 2 (two) times daily with a meal. 180 tablet 0  . cimetidine (TAGAMET) 300 MG tablet Take 1 tablet (300 mg total) by mouth 2 (two) times daily. 60 tablet 6  . clopidogrel (PLAVIX) 75 MG tablet TAKE 1 TABLET BY MOUTH  DAILY 90 tablet 3  . Multiple Vitamin (MULTIVITAMIN WITH MINERALS) TABS Take 1 tablet by mouth daily.    .  valsartan-hydrochlorothiazide (DIOVAN-HCT) 320-25 MG tablet Take 1 tablet by mouth daily. 90 tablet 0   No current facility-administered medications for this visit.    Allergies as of 11/19/2020  . (No Known Allergies)    Family History  Problem Relation Age of Onset  . Hypertension Mike Shepard        M,F,Sis, bro  . Diabetes Father        F, M. B  . Diabetes Sister   . Hypertension Sister   . Diabetes Brother   . Hypertension Brother   . Prostate cancer Paternal Grandfather        dx ~ 30 y/o  . Colon cancer Paternal Uncle   . Anesthesia problems Neg Hx   . CAD Neg Hx   . Stroke Neg Hx   . Stomach cancer Neg Hx   . Esophageal cancer Neg Hx   . Pancreatic cancer Neg Hx     Social History   Socioeconomic History  . Marital status: Married    Spouse name: Not on file  . Number of children: 3  . Years of education: Not on file  . Highest education level: Not on file  Occupational History  . Occupation: city of [place] ([employer])  Tobacco Use  . Smoking status: Former Smoker    Types: Cigarettes    Quit date: 01/27/2010    Years since quitting: 10.8  . Smokeless tobacco: Current User    Types: Chew  . Tobacco comment: Dips, rec to see dentist!  Vaping Use  . Vaping Use: Never used  Substance and Sexual Activity  . Alcohol use: Not Currently  . Drug use: No    Comment: denies   . Sexual activity: Yes    Birth control/protection: None  Other Topics Concern  . Not on file  Social History Narrative   Lives w/ Mike Shepard, 3 children plus adopted 3 children   Social Determinants of Health   Financial Resource Strain:   . Difficulty of Paying Living Expenses: Not on file  Food Insecurity:   . Worried About Charity fundraiser in the Last Year: Not on file  . Ran Out of Food in the Last Year: Not on file  Transportation Needs:   . Lack of Transportation (Medical): Not on file  . Lack of Transportation (Non-Medical): Not on file  Physical Activity:   . Days of Exercise per  Week: Not on file  . Minutes of Exercise per Session: Not on file  Stress:   . Feeling of Stress : Not on file  Social Connections:   . Frequency of Communication with Friends and Family: Not on file  . Frequency of Social Gatherings with Friends and Family: Not on file  . Attends Religious Services: Not on file  . Active Member of Clubs or Organizations: Not on file  . Attends Archivist Meetings: Not on file  .  Marital Status: Not on file  Intimate Partner Violence:   . Fear of Current or Ex-Partner: Not on file  . Emotionally Abused: Not on file  . Physically Abused: Not on file  . Sexually Abused: Not on file     Physical Exam: General:   Alert,  well-nourished, pleasant and cooperative in NAD Head:  Normocephalic and atraumatic. Eyes:  Sclera clear, no icterus.   Conjunctiva pink. Abdomen:  Soft,nontender, nondistended, normal bowel sounds, no rebound or guarding. No hepatosplenomegaly.   Neurologic:  Alert and  oriented x4;  grossly nonfocal Skin:  Intact without significant lesions or rashes. Psych:  Alert and cooperative. Normal mood and affect.    Candiace West L. Tarri Glenn, MD, MPH 11/19/2020, 11:17 AM

## 2020-11-19 NOTE — Patient Instructions (Addendum)
It's time for a colonoscopy.  Tips for colonoscopy:  - Stay well hydrated for 3-4 days prior to the exam. This reduces nausea and dehydration.  - To prevent skin/hemorrhoid irritation - prior to wiping, put A&Dointment or vaseline on the toilet paper. - Keep a towel or pad on the bed.  - Drink  64oz of clear liquids in the morning of prep day (prior to starting the prep) to be sure that there is enough fluid to flush the colon and stay hydrated!!!! This is in addition to the fluids required for preparation. - Use of a flavored hard candy, such as grape Anise Salvo, can counteract some of the flavor of the prep and may prevent some nausea.   COLONOSCOPY: You have been scheduled for a colonoscopy. Please follow written instructions given to you at your visit today.   PREP: Please pick up your prep supplies at the pharmacy within the next 1-3 days.  INHALERS: If you use inhalers (even only as needed), please bring them with you on the day of your procedure.  PLAVIX: You will be contacted by our office prior to your procedure for directions on holding your Plavix.  If you do not hear from our office 1 week prior to your scheduled procedure, please call [phone] to discuss.  If you are age 93 or younger, your body mass index should be between 19-25. Your Body mass index is 33.63 kg/m. If this is out of the aformentioned range listed, please consider follow up with your Primary Care Provider.   Thank you for trusting me with your gastrointestinal care!    Thornton Park, MD, MPH

## 2020-11-19 NOTE — Telephone Encounter (Signed)
Called pt and informed Dr. Carlis Abbott has agreed for him to hold Plavix x5 days. Advised last dose will be 11/20/20. Verbalized acceptance and understanding.

## 2020-11-19 NOTE — Telephone Encounter (Signed)
Should be fine to hold plavix.  Thanks

## 2020-11-21 ENCOUNTER — Encounter: Payer: Self-pay | Admitting: Gastroenterology

## 2020-11-25 ENCOUNTER — Other Ambulatory Visit: Payer: Self-pay

## 2020-11-25 ENCOUNTER — Encounter: Payer: Self-pay | Admitting: Gastroenterology

## 2020-11-25 ENCOUNTER — Ambulatory Visit (AMBULATORY_SURGERY_CENTER): Payer: 59 | Admitting: Gastroenterology

## 2020-11-25 VITALS — BP 116/62 | HR 46 | Temp 98.0°F | Resp 20 | Ht 72.0 in | Wt 248.0 lb

## 2020-11-25 DIAGNOSIS — D126 Benign neoplasm of colon, unspecified: Secondary | ICD-10-CM | POA: Diagnosis not present

## 2020-11-25 DIAGNOSIS — D128 Benign neoplasm of rectum: Secondary | ICD-10-CM

## 2020-11-25 DIAGNOSIS — D123 Benign neoplasm of transverse colon: Secondary | ICD-10-CM | POA: Diagnosis not present

## 2020-11-25 DIAGNOSIS — Z1211 Encounter for screening for malignant neoplasm of colon: Secondary | ICD-10-CM

## 2020-11-25 DIAGNOSIS — D124 Benign neoplasm of descending colon: Secondary | ICD-10-CM | POA: Diagnosis not present

## 2020-11-25 MED ORDER — SODIUM CHLORIDE 0.9 % IV SOLN: 500.0000 mL | Freq: Once | INTRAVENOUS | Status: DC

## 2020-11-25 NOTE — Progress Notes (Signed)
Pt's states no medical or surgical changes since previsit or office visit. 

## 2020-11-25 NOTE — Progress Notes (Signed)
C.W. vital signs. 

## 2020-11-25 NOTE — Op Note (Signed)
[facility] Patient Name: Mike Shepard Procedure Date: 11/25/2020 8:31 AM MRN: [id number] Endoscopist: Thornton Park MD, MD Age: 54 Referring MD:  Date of Birth: 04/13/1966 Gender: Male Account #: [id number] Procedure:                Colonoscopy Indications:              Screening for colorectal malignant neoplasm, This                            is the patient's first colonoscopy                           Mother with colon polyps                           No known family history of colon cancer Medicines:                Monitored Anesthesia Care Procedure:                Pre-Anesthesia Assessment:                           - Prior to the procedure, a History and Physical                            was performed, and patient medications and                            allergies were reviewed. The patient's tolerance of                            previous anesthesia was also reviewed. The risks                            and benefits of the procedure and the sedation                            options and risks were discussed with the patient.                            All questions were answered, and informed consent                            was obtained. Prior Anticoagulants: The patient has                            taken Plavix (clopidogrel), last dose was 5 days                            prior to procedure. ASA Grade Assessment: II - A                            patient with mild systemic disease. After reviewing  the risks and benefits, the patient was deemed in                            satisfactory condition to undergo the procedure.                           After obtaining informed consent, the colonoscope                            was passed under direct vision. Throughout the                            procedure, the patient's blood pressure, pulse, and                            oxygen saturations were monitored  continuously. The                            Colonoscope was introduced through the anus and                            advanced to the 3 cm into the ileum. A second                            forward view of the right colon was performed. The                            colonoscopy was performed without difficulty. The                            patient tolerated the procedure well. The quality                            of the bowel preparation was good. The terminal                            ileum, ileocecal valve, appendiceal orifice, and                            rectum were photographed. Scope In: 8:41:58 AM Scope Out: 8:59:10 AM Scope Withdrawal Time: 0 hours 14 minutes 58 seconds  Total Procedure Duration: 0 hours 17 minutes 12 seconds  Findings:                 The perianal and digital rectal examinations were                            normal.                           A 2 mm polyp was found in the rectum. The polyp was                            flat. The polyp was removed with a  cold snare.                            Resection and retrieval were complete. Estimated                            blood loss was minimal.                           Two sessile polyps were found in the transverse                            colon. The polyps were 3 mm in size. These polyps                            were removed with a cold snare. Resection and                            retrieval were complete. Estimated blood loss was                            minimal.                           A 2 mm polyp was found in the hepatic flexure. The                            polyp was flat. The polyp was removed with a cold                            snare. Resection and retrieval were complete.                            Estimated blood loss was minimal.                           The exam was otherwise without abnormality on                            direct and retroflexion views. Complications:             No immediate complications. Estimated blood loss:                            Minimal. Estimated Blood Loss:     Estimated blood loss was minimal. Impression:               - One 2 mm polyp in the rectum, removed with a cold                            snare. Resected and retrieved.                           - Two 3 mm polyps in the transverse colon, removed  with a cold snare. Resected and retrieved.                           - One 2 mm polyp at the hepatic flexure, removed                            with a cold snare. Resected and retrieved.                           - The examination was otherwise normal on direct                            and retroflexion views. Recommendation:           - Patient has a contact number available for                            emergencies. The signs and symptoms of potential                            delayed complications were discussed with the                            patient. Return to normal activities tomorrow.                            Written discharge instructions were provided to the                            patient.                           - Resume previous diet.                           - Continue present medications.                           - Resume Plavix (clopidogrel) at prior dose                            tomorrow.                           - Await pathology results.                           - Repeat colonoscopy date to be determined after                            pending pathology results are reviewed for                            surveillance.                           - Emerging evidence supports eating a  diet of                            fruits, vegetables, grains, calcium, and yogurt                            while reducing red meat and alcohol may reduce the                            risk of colon cancer.                           - Thank you for allowing me to be involved in your                             colon cancer prevention. Thornton Park MD, MD 11/25/2020 9:04:30 AM This report has been signed electronically.

## 2020-11-25 NOTE — Progress Notes (Signed)
Called to room to assist during endoscopic procedure.  Patient ID and intended procedure confirmed with present staff. Received instructions for my participation in the procedure from the performing physician.  

## 2020-11-25 NOTE — Progress Notes (Signed)
pt tolerated well. VSS. awake and to recovery. Report given to RN.  

## 2020-11-25 NOTE — Patient Instructions (Signed)
Resume plavix tomorrow ( 11/26/2020)  Handout on polyps given to you today  Await pathology results on polyps removed      YOU HAD AN ENDOSCOPIC PROCEDURE TODAY AT [facility]:   Refer to the procedure report that was given to you for any specific questions about what was found during the examination.  If the procedure report does not answer your questions, please call your gastroenterologist to clarify.  If you requested that your care partner not be given the details of your procedure findings, then the procedure report has been included in a sealed envelope for you to review at your convenience later.  YOU SHOULD EXPECT: Some feelings of bloating in the abdomen. Passage of more gas than usual.  Walking can help get rid of the air that was put into your GI tract during the procedure and reduce the bloating. If you had a lower endoscopy (such as a colonoscopy or flexible sigmoidoscopy) you may notice spotting of blood in your stool or on the toilet paper. If you underwent a bowel prep for your procedure, you may not have a normal bowel movement for a few days.  Please Note:  You might notice some irritation and congestion in your nose or some drainage.  This is from the oxygen used during your procedure.  There is no need for concern and it should clear up in a day or so.  SYMPTOMS TO REPORT IMMEDIATELY:   Following lower endoscopy (colonoscopy or flexible sigmoidoscopy):  Excessive amounts of blood in the stool  Significant tenderness or worsening of abdominal pains  Swelling of the abdomen that is new, acute  Fever of 100F or higher    For urgent or emergent issues, a gastroenterologist can be reached at any hour by calling [phone]. Do not use MyChart messaging for urgent concerns.    DIET:  We do recommend a small meal at first, but then you may proceed to your regular diet.  Drink plenty of fluids but you should avoid alcoholic beverages for 24  hours.  ACTIVITY:  You should plan to take it easy for the rest of today and you should NOT DRIVE or use heavy machinery until tomorrow (because of the sedation medicines used during the test).    FOLLOW UP: Our staff will call the number listed on your records 48-72 hours following your procedure to check on you and address any questions or concerns that you may have regarding the information given to you following your procedure. If we do not reach you, we will leave a message.  We will attempt to reach you two times.  During this call, we will ask if you have developed any symptoms of COVID 19. If you develop any symptoms (ie: fever, flu-like symptoms, shortness of breath, cough etc.) before then, please call [phone].  If you test positive for Covid 19 in the 2 weeks post procedure, please call and report this information to [place].    If any biopsies were taken you will be contacted by phone or by letter within the next 1-3 weeks.  Please call us at [phone] if you have not heard about the biopsies in 3 weeks.    SIGNATURES/CONFIDENTIALITY: You and/or your care partner have signed paperwork which will be entered into your electronic medical record.  These signatures attest to the fact that that the information above on your After Visit Summary has been reviewed and is understood.  Full responsibility of the confidentiality of  this discharge information lies with you and/or your care-partner.

## 2020-11-26 ENCOUNTER — Ambulatory Visit: Payer: 59 | Admitting: Internal Medicine

## 2020-11-26 DIAGNOSIS — Z0289 Encounter for other administrative examinations: Secondary | ICD-10-CM

## 2020-11-29 ENCOUNTER — Telehealth: Payer: Self-pay | Admitting: *Deleted

## 2020-11-29 NOTE — Telephone Encounter (Signed)
  Follow up Call-  Call back number 11/25/2020  Post procedure Call Back phone  # [phone]  Permission to leave phone message Yes  Some recent data might be hidden    [facility]

## 2020-11-29 NOTE — Telephone Encounter (Signed)
  Follow up Call-  LMOM to call back with any questions or concerns.  Also, call back if patient has developed fever, respiratory issues or been dx with COVID or had any family members or close contacts diagnosed since her procedure.

## 2020-11-30 ENCOUNTER — Encounter: Payer: Self-pay | Admitting: Gastroenterology

## 2020-11-30 ENCOUNTER — Encounter: Payer: Self-pay | Admitting: Internal Medicine

## 2020-12-21 ENCOUNTER — Encounter: Payer: Self-pay | Admitting: Cardiology

## 2020-12-21 ENCOUNTER — Ambulatory Visit (INDEPENDENT_AMBULATORY_CARE_PROVIDER_SITE_OTHER): Payer: 59

## 2020-12-21 ENCOUNTER — Telehealth: Payer: Self-pay | Admitting: Internal Medicine

## 2020-12-21 ENCOUNTER — Other Ambulatory Visit: Payer: Self-pay

## 2020-12-21 ENCOUNTER — Ambulatory Visit: Payer: 59 | Admitting: Cardiology

## 2020-12-21 VITALS — BP 146/92 | HR 61 | Ht 72.0 in | Wt 246.0 lb

## 2020-12-21 DIAGNOSIS — R072 Precordial pain: Secondary | ICD-10-CM

## 2020-12-21 DIAGNOSIS — R079 Chest pain, unspecified: Secondary | ICD-10-CM | POA: Diagnosis not present

## 2020-12-21 DIAGNOSIS — I10 Essential (primary) hypertension: Secondary | ICD-10-CM | POA: Diagnosis not present

## 2020-12-21 DIAGNOSIS — R002 Palpitations: Secondary | ICD-10-CM

## 2020-12-21 DIAGNOSIS — E7800 Pure hypercholesterolemia, unspecified: Secondary | ICD-10-CM | POA: Diagnosis not present

## 2020-12-21 DIAGNOSIS — I6529 Occlusion and stenosis of unspecified carotid artery: Secondary | ICD-10-CM | POA: Insufficient documentation

## 2020-12-21 DIAGNOSIS — I6523 Occlusion and stenosis of bilateral carotid arteries: Secondary | ICD-10-CM

## 2020-12-21 MED ORDER — CARVEDILOL 25 MG PO TABS
25.0000 mg | ORAL_TABLET | Freq: Two times a day (BID) | ORAL | 0 refills | Status: DC
Start: 2020-12-21 — End: 2021-02-25

## 2020-12-21 MED ORDER — CHLORTHALIDONE 50 MG PO TABS
50.0000 mg | ORAL_TABLET | Freq: Every day | ORAL | 3 refills | Status: DC
Start: 2020-12-21 — End: 2020-12-21

## 2020-12-21 MED ORDER — CHLORTHALIDONE 50 MG PO TABS: 50.0000 mg | ORAL_TABLET | Freq: Every day | ORAL | 3 refills | Status: DC

## 2020-12-21 MED ORDER — AMLODIPINE BESYLATE 10 MG PO TABS
10.0000 mg | ORAL_TABLET | Freq: Every day | ORAL | 0 refills | Status: DC
Start: 2020-12-21 — End: 2021-09-27

## 2020-12-21 MED ORDER — METOPROLOL TARTRATE 100 MG PO TABS: 100.0000 mg | ORAL_TABLET | Freq: Once | ORAL | 0 refills | Status: DC

## 2020-12-21 MED ORDER — VALSARTAN 320 MG PO TABS: 320.0000 mg | ORAL_TABLET | Freq: Every day | ORAL | 3 refills | Status: DC

## 2020-12-21 NOTE — Patient Instructions (Addendum)
Medication Instructions:  Your physician has recommended you make the following change in your medication:  1) STOP taking Diovan-HCTZ  2) START taking chlorthalidone 50 mg daily  3) START taking valsartan 320 mg daily  *If you need a refill on your cardiac medications before your next appointment, please call your pharmacy*   Lab Work: In one week: BMET, TSH, PRA, 24-hour urine  If you have labs (blood work) drawn today and your tests are completely normal, you will receive your results only by: Marland Kitchen MyChart Message (if you have MyChart) OR . A paper copy in the mail If you have any lab test that is abnormal or we need to change your treatment, we will call you to review the results.   Testing/Procedures: Your physician has requested that you have a renal artery duplex. During this test, an ultrasound is used to evaluate blood flow to the kidneys. Allow one hour for this exam. Do not eat after midnight the day before and avoid carbonated beverages. Take your medications as you usually do.  Your physician has recommended that you wear an event monitor. Event monitors are medical devices that record the heart's electrical activity. Doctors most often [place] these monitors to diagnose arrhythmias. Arrhythmias are problems with the speed or rhythm of the heartbeat. The monitor is a small, portable device. You can wear one while you do your normal daily activities. This is usually used to diagnose what is causing palpitations/syncope (passing out).  You physician has recommended that you have a coronary CTA scan done. Please see next page for further instructions.    Follow-Up: At [facility], you and your health needs are our priority.  As part of our continuing mission to provide you with exceptional heart care, we have created designated Provider Care Teams.  These Care Teams include your primary Cardiologist (physician) and Advanced Practice Providers (APPs -  Physician Assistants and Nurse  Practitioners) who all work together to provide you with the care you need, when you need it.  Your next appointment:   3 month(s)  The format for your next appointment:   In Person  Provider:   You may see Fransico Him, MD or one of the following Advanced Practice Providers on your designated Care Team:    Melina Copa, PA-C  Ermalinda Barrios, PA-C  Other Instructions You have been referred to see the PharmD in our Hypertension Clinic in two weeks.    Your cardiac CT will be scheduled at one of the below locations:   [facility] [address] [place], [place] [address] [phone]  If scheduled at [facility], please arrive at the [facility] main entrance of [facility] 30 minutes prior to test start time. Proceed to the [facility] Radiology Department (first floor) to check-in and test prep.  Please follow these instructions carefully (unless otherwise directed):  Hold all erectile dysfunction medications at least 3 days (72 hrs) prior to test.  On the Night Before the Test: . Be sure to Drink plenty of water. . Do not consume any caffeinated/decaffeinated beverages or chocolate 12 hours prior to your test. . Do not take any antihistamines 12 hours prior to your test.  On the Day of the Test: . Drink plenty of water. Do not drink any water within one hour of the test. . Do not eat any food 4 hours prior to the test. . You may take your regular medications prior to the test.  . Take  metoprolol (Lopressor) two hours prior to test. . HOLD Furosemide/Hydrochlorothiazide morning of the test.       After the Test: . Drink plenty of water. . After receiving IV contrast, you may experience a mild flushed feeling. This is normal. . On occasion, you may experience a mild rash up to 24 hours after the test. This is not dangerous. If this occurs, you can take Benadryl 25 mg and increase your fluid intake. . If you experience trouble breathing, this  can be serious. If it is severe call 911 IMMEDIATELY. If it is mild, please call our office. . If you take any of these medications: Glipizide/Metformin, Avandament, Glucavance, please do not take 48 hours after completing test unless otherwise instructed.   Once we have confirmed authorization from your insurance company, we will call you to set up a date and time for your test. Based on how quickly your insurance processes prior authorizations requests, please allow up to 4 weeks to be contacted for scheduling your Cardiac CT appointment. Be advised that routine Cardiac CT appointments could be scheduled as many as 8 weeks after your provider has ordered it.  For non-scheduling related questions, please contact the cardiac imaging nurse navigator should you have any questions/concerns: Marchia Bond, Cardiac Imaging Nurse Navigator Burley Saver, Interim Cardiac Imaging Nurse [place] and Vascular Services Direct Office Dial: [phone]   For scheduling needs, including cancellations and rescheduling, please call [place], [phone].

## 2020-12-21 NOTE — Telephone Encounter (Signed)
Rxs sent

## 2020-12-21 NOTE — Addendum Note (Signed)
Addended by: Theresia Majors on: 12/21/2020 03:25 PM   Modules accepted: Orders

## 2020-12-21 NOTE — Addendum Note (Signed)
Addended by: Theresia Majors on: 12/21/2020 03:00 PM   Modules accepted: Orders

## 2020-12-21 NOTE — Progress Notes (Addendum)
Cardiology Consult Note    Date:  12/21/2020   ID:  Mike Shepard, DOB 1966-08-26, MRN [id number]  PCP:  Wanda Plump, MD  Cardiologist:  Armanda Magic, MD   Chief Complaint  Patient presents with  . New Patient (Initial Visit)    HTN, carotid stenosis    History of Present Illness:  Mike Shepard is a 55 y.o. male who is being seen today for the evaluation of HTN at the request of Wanda Plump, MD.  This is a 55yo male with a hx of right carotid artery stenosis (s/p RCEA 8 months ago and now with occluded RICA and 1-39% LICA by dopplers 07/2200), GERD, HLD, HTN and CVA.  He is now referred by his PCP for evaluation of poorly controlled HTN.  He has had some chest pressure that only lasts a few seconds at a time and is usually nonexertional.  There is no associated sx of diaphoresis, nausea or SOB with the pain.  He has noticed some DOE as well.  He denies any  PND, orthopnea, LE edema or syncope. Occasionally he will notice some dizziness at random times.  He also has had episodes of rapid HR that occur at rest and he says that his HR just takes off fast and then stops on its own. He admits to drinking 2-3 cups of coffee in am.  He used to smoke but quit at age 71 (20 years total). His father died of an MI at age 61. He is compliant with his meds and is tolerating meds with no SE.    Past Medical History:  Diagnosis Date  . Carotid artery stenosis    s/p RCEA now with occluded RICA and 1-39% LICA stenosis by dopplers 10/2020  . Chronic back pain    see surgeries   . GERD (gastroesophageal reflux disease)   . H/O: gout   . Hyperlipidemia   . Hypertension   . PPD positive    Positive PPD, remotely ~2000, s/p Rx    . Pre-diabetes   . Primary osteoarthritis of right hip    end stage  . Stroke [facility])    " mild"  . Wears dentures     Past Surgical History:  Procedure Laterality Date  . BACK SURGERY     2007-2013 (low back)  . ENDARTERECTOMY Right 06/02/2019   Procedure:  ENDARTERECTOMY CAROTID RIGHT;  Surgeon: Cephus Shelling, MD;  Location: [facility] OR;  Service: Vascular;  Laterality: Right;  . INTRAOPERATIVE ARTERIOGRAM  06/02/2019   Procedure: Right carotid and cerebral angiogram;  Surgeon: Cephus Shelling, MD;  Location: [facility] OR;  Service: Vascular;;  . MULTIPLE TOOTH EXTRACTIONS    . NECK SURGERY     2010  . OPERATIVE ULTRASOUND Right 06/02/2019   Procedure: Operative Ultrasound;  Surgeon: Cephus Shelling, MD;  Location: [facility] OR;  Service: Vascular;  Laterality: Right;    Current Medications: Current Meds  Medication Sig  . amLODipine (NORVASC) 10 MG tablet Take 1 tablet (10 mg total) by mouth daily.  Marland Kitchen aspirin EC 81 MG EC tablet Take 1 tablet (81 mg total) by mouth daily.  Marland Kitchen atorvastatin (LIPITOR) 20 MG tablet Take 1 tablet (20 mg total) by mouth at bedtime.  . buprenorphine (SUBUTEX) 8 MG SUBL SL tablet Place 8 mg under the tongue 2 (two) times a day.   . carvedilol (COREG) 25 MG tablet Take 1 tablet (25 mg total) by mouth 2 (two) times daily with a  meal.  . cimetidine (TAGAMET) 300 MG tablet Take 1 tablet (300 mg total) by mouth 2 (two) times daily.  . clopidogrel (PLAVIX) 75 MG tablet TAKE 1 TABLET BY MOUTH  DAILY  . Multiple Vitamin (MULTIVITAMIN WITH MINERALS) TABS Take 1 tablet by mouth daily.  . valsartan-hydrochlorothiazide (DIOVAN-HCT) 320-25 MG tablet Take 1 tablet by mouth once daily    Allergies:   Patient has no known allergies.   Social History   Socioeconomic History  . Marital status: Married    Spouse name: Not on file  . Number of children: 3  . Years of education: Not on file  . Highest education level: Not on file  Occupational History  . Occupation: [employer] [place] ([employer])  Tobacco Use  . Smoking status: Former Smoker    Types: Cigarettes    Quit date: 01/27/2010    Years since quitting: 10.9  . Smokeless tobacco: Current User    Types: Chew  . Tobacco comment: Dips, rec to see dentist!  Vaping Use  . Vaping  Use: Never used  Substance and Sexual Activity  . Alcohol use: Never  . Drug use: No    Comment: denies   . Sexual activity: Yes    Birth control/protection: None  Other Topics Concern  . Not on file  Social History Narrative   Lives w/ wife, 3 children plus adopted 3 children   Social Determinants of Health   Financial Resource Strain: Not on file  Food Insecurity: Not on file  Transportation Needs: Not on file  Physical Activity: Not on file  Stress: Not on file  Social Connections: Not on file     Family History:  The patient's family history includes Colon cancer in his paternal uncle; Diabetes in his brother and sister; Diabetes (age of onset: 61) in his father; Hypertension in his brother, mother, and sister; Prostate cancer in his paternal grandfather.   ROS:   Please see the history of present illness.    ROS All other systems reviewed and are negative.  No flowsheet data found.     PHYSICAL EXAM:   VS:  BP (!) 146/92   Pulse 61   Ht 6' (1.829 m)   Wt 246 lb (111.6 kg)   SpO2 96%   BMI 33.36 kg/m    GEN: Well nourished, well developed, in no acute distress  HEENT: normal  Neck: no JVD, carotid bruits, or masses Cardiac: RRR; no murmurs, rubs, or gallops,no edema.  Intact distal pulses bilaterally.  Respiratory:  clear to auscultation bilaterally, normal work of breathing GI: soft, nontender, nondistended, + BS MS: no deformity or atrophy  Skin: warm and dry, no rash Neuro:  Alert and Oriented x 3, Strength and sensation are intact Psych: euthymic mood, full affect  Wt Readings from Last 3 Encounters:  12/21/20 246 lb (111.6 kg)  11/25/20 248 lb (112.5 kg)  11/19/20 248 lb (112.5 kg)      Studies/Labs Reviewed:   EKG:  EKG is not ordered today.    Recent Labs: 02/06/2020: Hemoglobin 14.9; Platelets 226.0; TSH 0.96 05/21/2020: ALT 42; BUN 15; Creatinine, Ser 0.97; Potassium 3.9; Sodium 136   Lipid Panel    Component Value Date/Time   CHOL 140  08/29/2019 1000   TRIG 171.0 (H) 08/29/2019 1000   HDL 37.50 (L) 08/29/2019 1000   CHOLHDL 4 08/29/2019 1000   VLDL 34.2 08/29/2019 1000   LDLCALC 69 08/29/2019 1000   LDLDIRECT 87.0 06/26/2019 0943  Additional studies/ records that were reviewed today include:  OV notes from PCP, carotid dopplers    ASSESSMENT:    1. Essential hypertension   2. Bilateral carotid artery stenosis   3. Pure hypercholesterolemia   4. Chest pain of uncertain etiology   5. Palpitations      PLAN:  In order of problems listed above:  1.  HTN -BP goal < 130/10mmHg and therefore still not at goal -he is on 3 BP meds at max doses with persistently elevated BP -continue Amlodipine 10mg  daily, Carvedilol 25mg  BID  -stop Diovan HCT and continue Valsartan 320mg  daily -add Chlorthalidone 50mg  daily -check BMET in 1 week -followup with PharmD in 2 weeks -will check for secondary causes of HTN -check renal dopplers, PRA, aldo and renin levels and 24 hour urine for catecholamines, dopamine, metanephrines, cortisol and VMA -check TSH and BMET -will get home sleep study to rule out OSA as etiology of poorly controlled HTN  2.  Carotid artery stenosis -dopplers 07/2020 showed occluded RICA and 123456 LICA -followed by PCP -continue ASA 81mg  daily, Plavix 75mg  daily and statin  3. HLD -LDL goal < 70 due to carotid artery stenosis -continue Lipitor 20mg  daily  4.  Chest pain -this is atypical but he has hx of vascular disease as well as remote hx of tobacco abuse, fm hx of premature CAD, HLD and HTN -EKG nonischemic in Sept 2021 -I will get a coronary CTA to define coronary anatomy  5.  Palpitations -? Related to caffeine -will get 2 week ziopatch to evaluate   Followup with me in 3 months  Medication Adjustments/Labs and Tests Ordered: Current medicines are reviewed at length with the patient today.  Concerns regarding medicines are outlined above.  Medication changes, Labs and Tests  ordered today are listed in the Patient Instructions below.  There are no Patient Instructions on file for this visit.   Signed, Fransico Him, MD  12/21/2020 2:43 PM    [address], [place], [place]  [address] Phone: [phone]; Fax: [phone]

## 2020-12-21 NOTE — Telephone Encounter (Signed)
Patient is requesting a 30 day supply of medication sent to local pharmacy b/c mail order will not get medication out to patient before her runs out   Please send in amLODipine (NORVASC) 10 MG tablet [[id number]]  carvedilol (COREG) 25 MG tablet [[id number]]   To  [facility] [address], [place] - [address] Phone:  [phone]  Fax:  [phone]

## 2020-12-24 ENCOUNTER — Other Ambulatory Visit: Payer: Self-pay

## 2020-12-24 ENCOUNTER — Encounter: Payer: Self-pay | Admitting: Internal Medicine

## 2020-12-24 ENCOUNTER — Ambulatory Visit: Payer: 59 | Admitting: Internal Medicine

## 2020-12-24 VITALS — BP 151/90 | HR 71 | Temp 98.2°F | Resp 18 | Ht 72.0 in | Wt 248.1 lb

## 2020-12-24 DIAGNOSIS — I10 Essential (primary) hypertension: Secondary | ICD-10-CM | POA: Diagnosis not present

## 2020-12-24 DIAGNOSIS — I779 Disorder of arteries and arterioles, unspecified: Secondary | ICD-10-CM

## 2020-12-24 DIAGNOSIS — E119 Type 2 diabetes mellitus without complications: Secondary | ICD-10-CM | POA: Diagnosis not present

## 2020-12-24 DIAGNOSIS — R002 Palpitations: Secondary | ICD-10-CM

## 2020-12-24 NOTE — Patient Instructions (Addendum)
Per our records you are due for an eye exam. Please contact your eye doctor to schedule an appointment. Please have them send copies of your office visit notes to [place]. Our fax number is [phone].   Follow all the recommendations from cardiology  Check the  blood pressure daily if possible. BP GOAL is between 110/65 and  135/85. If it is consistently higher or lower, let me know  Watch the salt intake.   GO TO THE FRONT DESK, PLEASE SCHEDULE YOUR APPOINTMENTS Come back for a physical exam in 3 months

## 2020-12-24 NOTE — Progress Notes (Unsigned)
Pre visit review using our clinic review tool, if applicable. No additional management support is needed unless otherwise documented below in the visit note. 

## 2020-12-24 NOTE — Progress Notes (Signed)
Subjective:    Patient ID: Mike Shepard, male    DOB: [birth year], 55 y.o.   MRN: [id number]  DOS:  12/24/2020 Type of visit - description: Follow-up Since the last office visit, he changed the BP medications as recommended but failed to get follow-up blood work. Fortunately, he saw cardiology this week and they are working on his blood pressure.   Review of Systems Currently feeling well with no chest pain, difficulty breathing or lower extremity edema No nausea, vomiting, diarrhea.  Past Medical History:  Diagnosis Date   Carotid artery stenosis    s/p RCEA now with occluded RICA and 4-74% LICA stenosis by dopplers 10/2020   Chronic back pain    see surgeries    GERD (gastroesophageal reflux disease)    H/O: gout    Hyperlipidemia    Hypertension    PPD positive    Positive PPD, remotely ~2000, s/p Rx     Pre-diabetes    Primary osteoarthritis of right hip    end stage   Stroke [facility])    " mild"   Wears dentures     Past Surgical History:  Procedure Laterality Date   BACK SURGERY     2007-2013 (low back)   ENDARTERECTOMY Right 06/02/2019   Procedure: ENDARTERECTOMY CAROTID RIGHT;  Surgeon: Marty Heck, MD;  Location: [place];  Service: Vascular;  Laterality: Right;   INTRAOPERATIVE ARTERIOGRAM  06/02/2019   Procedure: Right carotid and cerebral angiogram;  Surgeon: Marty Heck, MD;  Location: [place];  Service: Vascular;;   MULTIPLE TOOTH EXTRACTIONS     NECK SURGERY     2010   OPERATIVE ULTRASOUND Right 06/02/2019   Procedure: Operative Ultrasound;  Surgeon: Marty Heck, MD;  Location: [place];  Service: Vascular;  Laterality: Right;    Allergies as of 12/24/2020   No Known Allergies     Medication List       Accurate as of December 24, 2020 11:59 PM. If you have any questions, ask your nurse or doctor.        amLODipine 10 MG tablet Commonly known as: NORVASC Take 1 tablet (10 mg total) by mouth daily.   aspirin 81  MG EC tablet Take 1 tablet (81 mg total) by mouth daily.   atorvastatin 20 MG tablet Commonly known as: LIPITOR Take 1 tablet (20 mg total) by mouth at bedtime.   buprenorphine 8 MG Subl SL tablet Commonly known as: SUBUTEX Place 8 mg under the tongue 2 (two) times a day.   carvedilol 25 MG tablet Commonly known as: COREG Take 1 tablet (25 mg total) by mouth 2 (two) times daily with a meal.   chlorthalidone 50 MG tablet Commonly known as: HYGROTON Take 1 tablet (50 mg total) by mouth daily.   cimetidine 300 MG tablet Commonly known as: TAGAMET Take 1 tablet (300 mg total) by mouth 2 (two) times daily.   clopidogrel 75 MG tablet Commonly known as: PLAVIX TAKE 1 TABLET BY MOUTH  DAILY   metoprolol tartrate 100 MG tablet Commonly known as: LOPRESSOR Take 1 tablet (100 mg total) by mouth once for 1 dose. Take 1 tablet (100 mg total) two hours prior to CT scan   multivitamin with minerals Tabs tablet Take 1 tablet by mouth daily.   valsartan 320 MG tablet Commonly known as: DIOVAN Take 1 tablet (320 mg total) by mouth daily.          Objective:   Physical Exam BP Marland Kitchen)  151/90 (BP Location: Left Arm, Patient Position: Sitting, Cuff Size: Normal)    Pulse 71    Temp 98.2 F (36.8 C) (Oral)    Resp 18    Ht 6' (1.829 m)    Wt 248 lb 2 oz (112.5 kg)    SpO2 98%    BMI 33.65 kg/m  General:   Well developed, NAD, BMI noted. HEENT:  Normocephalic . Face symmetric, atraumatic Lungs:  CTA B Normal respiratory effort, no intercostal retractions, no accessory muscle use. Heart: RRR,  no murmur.  DM foot exam: No edema, good pedal pulses, pinprick examination normal Skin: Not pale. Not jaundice Neurologic:  alert & oriented X3.  Speech normal, gait appropriate for age and unassisted Psych--  Cognition and judgment appear intact.  Cooperative with normal attention span and concentration.  Behavior appropriate. No anxious or depressed appearing.      Assessment       Assessment DM: A1c 6.5 (08/2019) HTN Hyperlipidemia CV: --MRI- brain  02-2018: Remote infarct, remote left basal ganglia and corona radiator perforated infarct -- R endarterectomy 05/2019 GERD Elevated TSH 2014 H/o gout  DJD- hip Chronic back pain + PPD 2000, s/p  antibiotics Covid DX 12/25/2019 Increase LFTs: noted 04/2019, coincide w/ statin RX, AP slightly elevated.U/S 02/2020: Fatty liver. 05/2020: Hep B negative, iron normal.   PLAN DM: Check A1c on the next opportunity.  Foot exam normal.  No neuropathy sxs HTN: See my last note, the patient implemented the changes as rec but failed to have follow-up blood work. Saw cardiology this week, they recommended work-up for secondary hypertension as well as a coronary CTA due to chest pain. Work-up pending. If neg will rec OSA w/u Also, they stop valsartan HCT and recommended valsartan and chlorthalidone.  He also takes amlodipine, carvedilol, metoprolol OSA? : see above Palpitations: Wearing a Zio patch per cardiology. Carotid artery disease: Saw Dr. 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  No plans for intervention.  He plans to follow the R carotid artery with 1-39 % stenosis. Preventive care: Declined flu shot or pneumonia shot. RTC 3 months  This visit occurred during the SARS-CoV-2 public health emergency.  Safety protocols were in place, including screening questions prior to the visit, additional usage of staff PPE, and extensive cleaning of exam room while observing appropriate contact time as indicated for disinfecting solutions.

## 2020-12-26 NOTE — Assessment & Plan Note (Signed)
DM: Check A1c on the next opportunity.  Foot exam normal.  No neuropathy sxs HTN: See my last note, the patient implemented the changes as rec but failed to have follow-up blood work. Saw cardiology this week, they recommended work-up for secondary hypertension as well as a coronary CTA due to chest pain. Work-up pending. If neg will rec OSA w/u Also, they stop valsartan HCT and recommended valsartan and chlorthalidone.  He also takes amlodipine, carvedilol, metoprolol OSA? : see above Palpitations: Wearing a Zio patch per cardiology. Carotid artery disease: Saw Dr. 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  No plans for intervention.  He plans to follow the R carotid artery with 1-39 % stenosis. Preventive care: Declined flu shot or pneumonia shot. RTC 3 months

## 2020-12-27 ENCOUNTER — Other Ambulatory Visit: Payer: Self-pay | Admitting: Cardiology

## 2020-12-27 DIAGNOSIS — R002 Palpitations: Secondary | ICD-10-CM

## 2020-12-27 DIAGNOSIS — I10 Essential (primary) hypertension: Secondary | ICD-10-CM

## 2020-12-27 DIAGNOSIS — R079 Chest pain, unspecified: Secondary | ICD-10-CM

## 2020-12-28 ENCOUNTER — Ambulatory Visit: Payer: 59

## 2020-12-29 ENCOUNTER — Other Ambulatory Visit: Payer: 59

## 2020-12-31 LAB — ALDOSTERONE + RENIN ACTIVITY W/ RATIO
ALDOS/RENIN RATIO: 0.2 (ref 0.0–30.0)
ALDOSTERONE: 4.2 ng/dL (ref 0.0–30.0)
Renin: 21.21 ng/mL/hr — ABNORMAL HIGH (ref 0.167–5.380)

## 2020-12-31 LAB — BASIC METABOLIC PANEL
BUN/Creatinine Ratio: 13 (ref 9–20)
BUN: 20 mg/dL (ref 6–24)
CO2: 22 mmol/L (ref 20–29)
Calcium: 9.7 mg/dL (ref 8.7–10.2)
Chloride: 100 mmol/L (ref 96–106)
Creatinine, Ser: 1.52 mg/dL — ABNORMAL HIGH (ref 0.76–1.27)
GFR calc Af Amer: 59 mL/min/{1.73_m2} — ABNORMAL LOW (ref >59)
GFR calc non Af Amer: 51 mL/min/{1.73_m2} — ABNORMAL LOW (ref >59)
Glucose: 104 mg/dL — ABNORMAL HIGH (ref 65–99)
Potassium: 4.5 mmol/L (ref 3.5–5.2)
Sodium: 135 mmol/L (ref 134–144)

## 2020-12-31 LAB — TSH: TSH: 2.08 u[IU]/mL (ref 0.450–4.500)

## 2021-01-04 ENCOUNTER — Inpatient Hospital Stay (HOSPITAL_COMMUNITY): Admission: RE | Admit: 2021-01-04 | Payer: 59 | Source: Ambulatory Visit

## 2021-01-05 ENCOUNTER — Telehealth: Payer: Self-pay

## 2021-01-05 ENCOUNTER — Telehealth: Payer: Self-pay | Admitting: *Deleted

## 2021-01-05 DIAGNOSIS — I10 Essential (primary) hypertension: Secondary | ICD-10-CM

## 2021-01-05 NOTE — Telephone Encounter (Signed)
PSG sleep study has been ordered.

## 2021-01-05 NOTE — Telephone Encounter (Signed)
-----   Message from [name], MD sent at 01/05/2021  8:05 AM EST ----- Regarding: RE: OSA? Percival Spanish will you please order a PSG on this patient for HTN  Traci ----- Message ----- From: Colon Branch, MD Sent: 01/04/2021   8:59 PM EST To: [name], MD, Colon Branch, MD Subject: OSA?                                           Dr. Radford Pax, Would you consider working him up for sleep apnea? Thank you JP

## 2021-01-05 NOTE — Telephone Encounter (Signed)
-----   Message from Antonieta Iba, RN sent at 01/05/2021 10:01 AM EST ----- PSG sleep study has been ordered for hypertension.  Thanks!

## 2021-01-06 LAB — METANEPHRINES, URINE, 24 HOUR
Metaneph Total, Ur: 106 ug/L
Metanephrines, 24H Ur: 265 ug/24 hr (ref 58–276)
Normetanephrine, 24H Ur: 1153 ug/24 hr — ABNORMAL HIGH (ref 156–729)
Normetanephrine, Ur: 461 ug/L

## 2021-01-06 LAB — CORTISOL, URINE, FREE
Cortisol (Ur), Free: 8 ug/24 hr (ref 5–64)
Cortisol,F,ug/L,U: 3 ug/L

## 2021-01-06 LAB — VANILLYLMANDELIC ACID, 24-HR U
VMA, 24H Ur Adult: 8 mg/24 hr — ABNORMAL HIGH (ref 0.0–7.5)
VMA, Urine: 3.2 mg/L

## 2021-01-06 LAB — CATECHOLAMINES, FRACTIONATED, URINE, 24 HOUR
Dopamine , 24H Ur: 415 ug/24 hr (ref 0–510)
Dopamine, Rand Ur: 166 ug/L
Epinephrine, 24H Ur: 13 ug/24 hr (ref 0–20)
Epinephrine, Rand Ur: 5 ug/L
Norepinephrine, 24H Ur: 148 ug/24 hr — ABNORMAL HIGH (ref 0–135)
Norepinephrine, Rand Ur: 59 ug/L

## 2021-01-07 ENCOUNTER — Ambulatory Visit: Payer: 59

## 2021-01-07 MED ORDER — HYDRALAZINE HCL 25 MG PO TABS: 25.0000 mg | ORAL_TABLET | Freq: Three times a day (TID) | ORAL | 3 refills | Status: DC

## 2021-01-07 NOTE — Addendum Note (Signed)
Addended by: Antonieta Iba on: 01/07/2021 12:23 PM   Modules accepted: Orders

## 2021-01-07 NOTE — Progress Notes (Signed)
Mike Margarita, MD  01/02/2021 11:19 AM EST      SCR has bumped with change in HCTZ to chlorthalidone. Renin markedly elevated as well (likely related to ARB) Please stop Chlorthalidone and Valsartan. Add Hydralazine 25mg  TID. Check BP daily for a week and and bring to PharmD app>>needs PharmD appt in [redacted] week along with BMET. Repeat Renin, Aldosterone and PRA levels in 2 weeks.    The patient has been notified of the result and verbalized understanding.  All questions (if any) were answered. Antonieta Iba, RN 01/07/2021 12:21 PM  Patient agreed with medication changes. Rx for hydralazine has been sent in. Patient was scheduled to see PharmD today and stated that he was not going to be able to make it to that appointment. I have rescheduled him to see PharmD next week 1/28, he will bring a list of BP's to that appointment and have lab work done at that time.

## 2021-01-10 ENCOUNTER — Telehealth: Payer: Self-pay | Admitting: Cardiology

## 2021-01-10 NOTE — Telephone Encounter (Signed)
Spoke with the patient who states that his blood pressure has been running 170s/90s. He was taken off of chlorthalidone and valsartan and was started on hydralazine 25 mg TID. He says that it has been elevated ever since this change. He states that he does have a headache. Denies chest pain/SOB. Heart rate has been in the 70s.

## 2021-01-10 NOTE — Telephone Encounter (Signed)
Pt c/o BP issue: STAT if pt c/o blurred vision, one-sided weakness or slurred speech  1. What are your last 5 BP readings?  01/09/21:183/91 After he took his medicine today: 170/90 171/93  2. Are you having any other symptoms (ex. Dizziness, headache, blurred vision, passed out)?  Slight headache, sometimes sharp   3. What is your BP issue?  Elevated blood pressure, states he was started on a new medication Friday evening for blood pressure been elevated since then. States he was taken off two medications and put on a new one.   Please call/advise.   Thank you!

## 2021-01-11 ENCOUNTER — Telehealth: Payer: Self-pay | Admitting: *Deleted

## 2021-01-11 MED ORDER — HYDRALAZINE HCL 50 MG PO TABS: 50.0000 mg | ORAL_TABLET | Freq: Three times a day (TID) | ORAL | 3 refills | Status: DC

## 2021-01-11 NOTE — Telephone Encounter (Signed)
Increase Hydralazine to 50mg TID and check Bp daily for a week and call with results 

## 2021-01-11 NOTE — Telephone Encounter (Signed)
Staff message sent to Mike Shepard per TT change to HST. Patient has no co morbidities to warrant in lab study. Okay to schedule. Patient has [employer] and does not require a PA for HST.

## 2021-01-11 NOTE — Telephone Encounter (Signed)
Spoke with the patient and he will increase hydralazine to 50 mg TID. He will see the pharmacist on 2/4 and will bring a list of his readings to that appointment.

## 2021-01-12 ENCOUNTER — Telehealth: Payer: Self-pay

## 2021-01-12 DIAGNOSIS — E349 Endocrine disorder, unspecified: Secondary | ICD-10-CM

## 2021-01-12 DIAGNOSIS — R825 Elevated urine levels of drugs, medicaments and biological substances: Secondary | ICD-10-CM

## 2021-01-12 NOTE — Telephone Encounter (Signed)
The patient has been notified of the result and verbalized understanding.  All questions (if any) were answered. Antonieta Iba, RN 01/12/2021 12:37 PM  Referral has been placed for endocrinology.

## 2021-01-12 NOTE — Telephone Encounter (Signed)
-----   Message from [name], MD sent at 01/12/2021 11:15 AM EST ----- Cortisol normal and VMA mildly elevated - await referral from Endocrine for elevated catecholamines

## 2021-01-14 ENCOUNTER — Ambulatory Visit: Payer: 59

## 2021-01-14 ENCOUNTER — Other Ambulatory Visit: Payer: 59

## 2021-01-15 IMAGING — CT CT ANGIOGRAPHY NECK
2 of 7 series · 8 of 33 positions shown · IV contrast (OMNIPAQUE)
Comparison: MRI 03/16/2018

CLINICAL DATA: Abnormal right carotid seen on MRI study 03/16/2018.

EXAM:
CT ANGIOGRAPHY NECK
TECHNIQUE: Multidetector CT imaging of the neck was performed using the
standard protocol during bolus administration of intravenous
contrast. Multiplanar CT image reconstructions and MIPs were
obtained to evaluate the vascular anatomy. Carotid stenosis
measurements (when applicable) are obtained utilizing NASCET
criteria, using the distal internal carotid diameter as the
denominator.
CONTRAST:  100mL OMNIPAQUE IOHEXOL 350 MG/ML SOLN

[Series 4: cta neck · axial · 0.58mm/px · z∈[+1354,+1438]mm · 2 of 128 slices shown]
[im 43/128  soft-tissue]
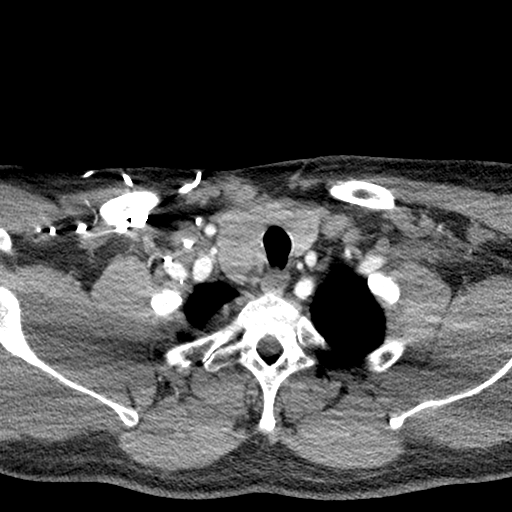
[im 85/128  soft-tissue]
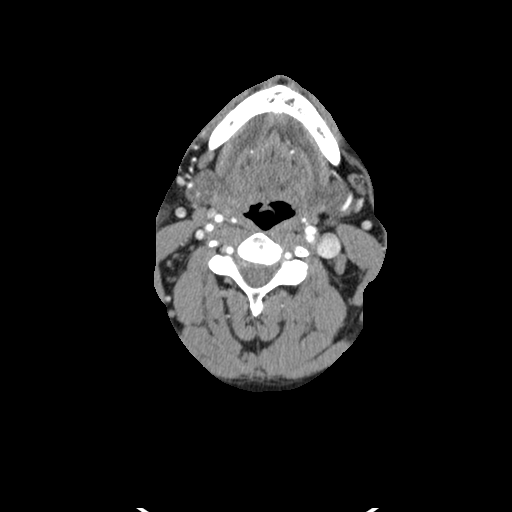

[Series 6: ax thin · axial · 0.48mm/px · z∈[+1275,+1470]mm · 6 of 278 slices shown]
[im 40/278  soft-tissue]
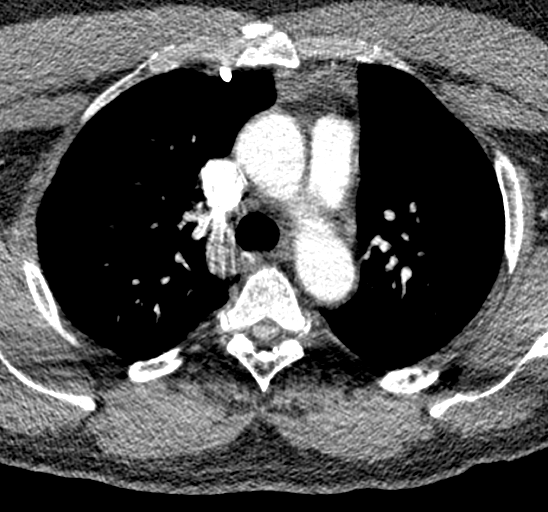
[im 80/278  bone]
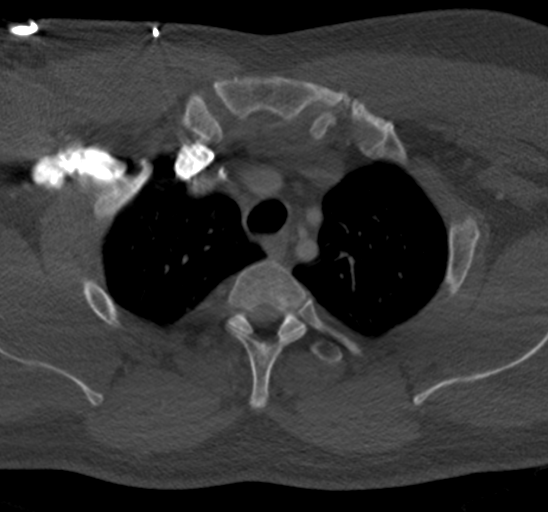
[im 119/278  soft-tissue]
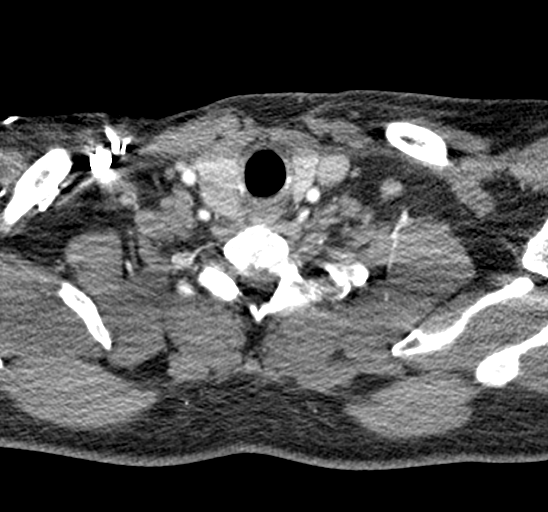
[im 159/278  bone]
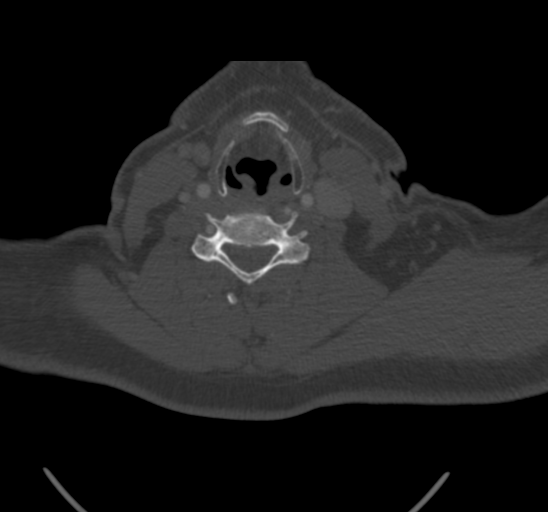
[im 198/278  soft-tissue]
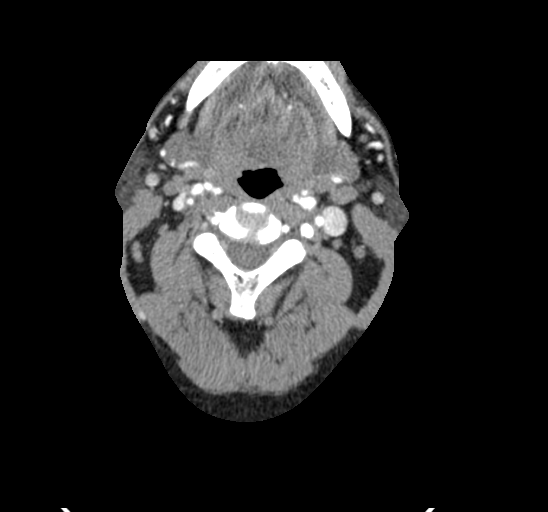
[im 238/278  bone]
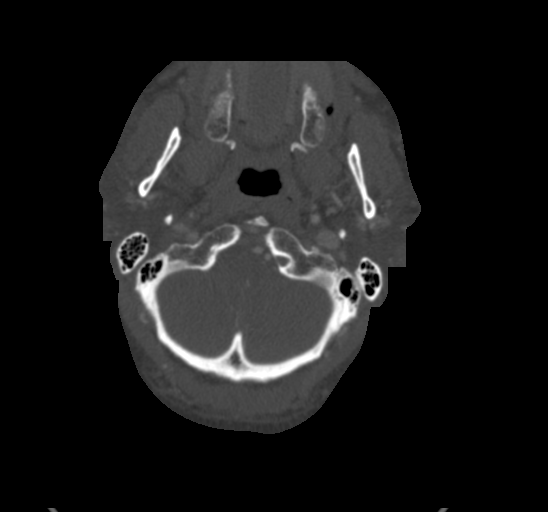

[8 of 33 positions shown; findings below may reference images not displayed]

FINDINGS: Aortic arch: Normal. No aneurysm or dissection. Brachiocephalic
vessel origins are normal. Left vertebral artery arises from the
arch.

Right carotid system: Common carotid artery appears normal. There is
poor visualization at the thoracic inlet related to dense adjacent
venous contrast. There is advanced soft and calcified plaque at the
carotid bifurcation and ICA bulb. Luminal diameter at the origin of
the ICA measures 1 mm. Vessel regains a more normal caliber in the
bulb, and then shows reduction in caliber throughout the remainder
of the cervical ICA to a diameter of 1 mm. Vessel is patent at least
to the skull base.

Left carotid system: Common carotid artery is widely patent to the
bifurcation region. There is primarily soft plaque at the carotid
bifurcation and ICA bulb. Minimal diameter at the proximal ICA is 3
mm. Compared to a more distal cervical ICA diameter of 4 mm, there
is a 25% stenosis.

Vertebral arteries: Both vertebral artery origins are widely patent.
Left vertebral artery arises from the arch. Both vertebral arteries
appear widely patent through the cervical region, through the
foramen magnum and to the basilar. There are segments that are
poorly seen, at the level cervical fusion hardware and at the
thoracic inlet.

Skeleton: Disc arthroplasty C5-6.

Other neck: Enlarged thyroid gland, more on the right than the left,
presumed to represent benign goiter. Has this been evaluated by
ultrasound?

Upper chest: Negative
IMPRESSION: Advanced atherosclerotic disease at the right carotid bifurcation
and ICA bulb. Minimal diameter at the origin of the right ICA is 1
mm. At the distal bulb and beyond, the cervical ICA measures only 1
mm in diameter. Antegrade flow is demonstrated to the level of the
skull base.

Atherosclerotic disease at the left carotid bifurcation. Minimal
diameter of the proximal ICA measures 3 mm. Compared to a more
distal cervical ICA diameter of 4 mm, this indicates a 25% stenosis.

## 2021-01-17 ENCOUNTER — Telehealth (HOSPITAL_COMMUNITY): Payer: Self-pay | Admitting: Emergency Medicine

## 2021-01-17 ENCOUNTER — Encounter (HOSPITAL_COMMUNITY): Payer: 59

## 2021-01-17 NOTE — Telephone Encounter (Signed)
Calling patient to review CCTA instructions however patient requesting to postpone appt til march  Pt was r/s to 02/18/21 at 12:15p which is the same day as a renal US. Pt verbalized understanding. Pt will still get labs done on 01/21/21 which is exactly a month out from new CCTA appt.   Marchia Bond RN Navigator Cardiac Imaging [facility] [phone] Office  [phone] Cell

## 2021-01-18 ENCOUNTER — Ambulatory Visit (HOSPITAL_COMMUNITY): Payer: 59

## 2021-01-19 NOTE — Addendum Note (Signed)
Addended by: Freada Bergeron on: 01/19/2021 05:04 PM   Modules accepted: Orders

## 2021-01-21 ENCOUNTER — Other Ambulatory Visit: Payer: 59 | Admitting: *Deleted

## 2021-01-21 ENCOUNTER — Ambulatory Visit (INDEPENDENT_AMBULATORY_CARE_PROVIDER_SITE_OTHER): Payer: 59 | Admitting: Pharmacist

## 2021-01-21 ENCOUNTER — Other Ambulatory Visit: Payer: Self-pay

## 2021-01-21 VITALS — BP 130/76 | HR 73

## 2021-01-21 DIAGNOSIS — I10 Essential (primary) hypertension: Secondary | ICD-10-CM

## 2021-01-21 DIAGNOSIS — R079 Chest pain, unspecified: Secondary | ICD-10-CM

## 2021-01-21 NOTE — Patient Instructions (Addendum)
Stop taking carvedilol three times a day- resume taking only twice a day  Increase hydralazine to 75mg  three times a day  Try for a 12 hour fast every day (no eating for 12 hours after dinner)  I will call you Monday with your lab results  Try to start exercising. Start with 10-15 min a few days a week and increase as tolerated.  Call me at [phone] with any questions or concerns  Hypertension "High blood pressure"  Hypertension is often called "The Silent Killer." It rarely causes symptoms until it is extremely  high or has done damage to other organs in the body. For this reason, you should have your  blood pressure checked regularly by your physician. We will check your blood pressure  every time you see a provider at one of our offices.   Your blood pressure reading consists of two numbers. Ideally, blood pressure should be  below 120/80. The first ("top") number is called the systolic pressure. It measures the  pressure in your arteries as your heart beats. The second ("bottom") number is called the diastolic pressure. It measures the pressure in your arteries as the heart relaxes between beats.  The benefits of getting your blood pressure under control are enormous. A 10-point  reduction in systolic blood pressure can reduce your risk of stroke by 27% and heart failure by 28%  Your blood pressure goal is <130/80  To check your pressure at home you will need to:  1. Sit up in a chair, with feet flat on the floor and back supported. Do not cross your ankles or legs. 2. Rest your left arm so that the cuff is about heart level. If the cuff goes on your upper arm,  then just relax the arm on the table, arm of the chair or your lap. If you have a wrist cuff, we  suggest relaxing your wrist against your chest (think of it as Pledging the Flag with the  wrong arm).  3. Place the cuff snugly around your arm, about 1 inch above the crook of your elbow. The  cords should be  inside the groove of your elbow.  4. Sit quietly, with the cuff in place, for about 5 minutes. After that 5 minutes press the power  button to start a reading. 5. Do not talk or move while the reading is taking place.  6. Record your readings on a sheet of paper. Although most cuffs have a memory, it is often  easier to see a pattern developing when the numbers are all in front of you.  7. You can repeat the reading after 1-3 minutes if it is recommended  Make sure your bladder is empty and you have not had caffeine or tobacco within the last 30 min  Always bring your blood pressure log with you to your appointments. If you have not brought your monitor in to be double checked for accuracy, please bring it to your next appointment.  You can find a list of validated (accurate) blood pressure cuffs at PopPath.it  Healthy Diet  SALT  What is the big deal with sodium? Why the need to limit our intake? What is the connection to  blood pressure? And what is the difference between salt and sodium? Sodium attracts water. Think about it. When you eat an overly salty snack, you tend to become  thirsty and need more water. If you have too much sodium in your bloodstream, your body will  then pull  water into the bloodstream as well, trying to correct the imbalance. When you have  more volume in the bloodstream, your blood pressure goes up. Your heart must work harder to  pump the extra volume, and the increase in pressure can wear out blood vessels faster. You may  also notice bloating and weight gain. Hypertension is one of the leading risk factors for heart  disease. By limiting sodium intake throughout life you are helping decrease your risk of heart  disease later on.   Salt is made up of two minerals. Sodium and chloride. A teaspoon of salt contains about 40%  sodium and 60% chloride. One teaspoon of salt has 2,300 mg of sodium. While the current  USDA guideline states you should consume  no more than 2,300 mg per day, both they and the  American Heart Association recommend that you limit this to 1,500 mg to stay healthy. Sea salt  or [place] pink salt may have a slightly different taste, but they still have almost the same  percent of sodium per teaspoon. So feel free to use them instead of table salt, but don't use  more.  A common myth is that if you don't add salt to your food, you are following a low sodium diet.  However, 75% of the sodium consumed in the American diet is from processed foods, NOT the  salt shaker. We all know that chips and crackers are high in sodium, but there are many other  foods that we may not think of when limiting our sodium. Below are the "salty six" foods that  the American Heart Association wants you to be aware of. 1. Cold cuts - even the healthy sliced [place] can have over 1,000 mg of sodium per slice.   Compare different brands to see which has less sodium if you eat these regularly 2. Pizza - depending on your toppings, a slice of pizza can have up to 760 mg of   sodium. Put more veggies on it or just have a slice with a side salad and still enjoy. 3. Soup - yes even that old home remedy of chicken soup is loaded with sodium. Look   for low sodium versions. Or add a bunch of frozen veggies when heating it, this will   give you less sodium per serving 4. Breads - they may not taste salty, but a single slice of bread can have up to 230 mg of   sodium. Toast for breakfast, a sandwich at lunch and a dinner roll can quickly add up   to over 900 mg in just one day.  5. Chicken - some fresh or frozen chicken is injected with a sodium solution before it   reaches the store. A 4 oz serving should have no more than 100 mg sodium. And   watch for breaded frozen chicken nuggets, strips and tenders. They may seem like a   quick and easy "healthy" meal, but they have high amounts of sodium as well. 6. Burritos/tacos - just 2 teaspoons of taco  seasoning can have over 400 mg sodium.   Try making your own with equal parts of cumin, oregano, chili powder and garlic powder.   SUGAR  Sugar is a huge problem in the modern day diet. Sugar is a HUGE contributor to heart disease, diabetes, high triglyceride levels, fatty liver diease and obesity. Sugar is hidden in almost all packaged foods/beverages. It adds no nutritional benefit to your body and can cause major harm.  Added sugar is extra sugar that is added beyond what is naturally found. The American Heart Association recommends limiting added sugars to no more than 25g for women and 36 grams for men per day.  There are many names for sugar maltose, sucrose (names ending in "ose"), high fructose corn syrup, molasses, cane sugar, corn sweetener, raw sugar, syrup, honey or fruit juice concentrate.   One of the best ways to limit your added sugars is to stop drinking sweetened beverages such as soda, sweet tea, fruit juice or fancy coffee's. There is 65g of added sugars in one 20oz bottle of Coke!! That is equal to 6 donuts.   Pay attention and read all nutrition facts labels. Below is an examples of a nutrition facts label. The #1 is showing you the total sugars where the # 2 is showing you the added sugars. This one severing has almost the max amount of added sugars per day!  Watch out for items that say "low fat" or "no added sugar" as these products are typically very high in sugar. The food industry uses these terms to fool you into thinking they are healthy.  For more information on the dangers of sugar watch WHY Sugar is as Bad as Alcohol (Fructose, The Liver Toxin) on YouTube.    EXERCISE  Exercise can help lower your blood pressure ~5 points systolic (top #) and 8 points diastolic (bottom #)  Exercise is good. We've all heard that. In an ideal world, we would all have time and resources to  get plenty of it. When you are active your heart pumps more efficiently and you will feel  better.  Multiple studies show that even walking regularly has benefits that include living a longer life.  The American Heart Association recommends 90-150 minutes per week of exercise (30 minutes  per day most days of the week). You can do this in any increment you wish. Nine or more  10-minute walks count. So does an hour-long exercise class. Break the time apart into what will  work in your life. Some of the best things you can do include walking briskly, jogging, cycling or  swimming laps. Not everyone is ready to "exercise." Sometimes we need to start with just getting active. Here  are some easy ways to be more active throughout the day: Marland Kitchen Take the stairs instead of the elevator . Go for a 10-15 minute walk during your lunch break (find a friend to make it more enjoyable) . When shopping, park at the back of the parking lot . If you take public transportation, get off one stop early and walk the extra distance . Pace around while making phone calls (most of [place] are not attached to phone cords any longer!) Check with your doctor if you aren't sure what your limitations may be. Always remember to drink plenty of water when doing any type of exercise. Don't feel like a failure if you're not getting the 90-150 minutes per week. If you started by being  a couch potato, then just a 10-minute walk each day is a huge improvement. Start with little  victories and work your way up.   Healthy Eating Tips  When looking to improve your eating habits, whether to lose weight, lower blood pressure or just be healthier, it helps to know what a serving size is.   Grains 1 slice of bread,  bagel,  cup pasta or rice  Vegetables 1 cup fresh or raw vegetables,  cup cooked  or canned Fruits 1 piece of medium sized fruit,  cup canned,   Meats/Proteins  cup dried       1 oz meat, 1 egg,  cup cooked beans, nuts or seeds  Dairy        Fats Individual yogurt container, 1 cup (8oz)    1 teaspoon  margarine/butter or vegetable  milk or milk alternative, 1 slice of cheese          oil; 1 tablespoon mayonnaise or salad dressing                  Plan ahead: make a menu of the meals for a week then create a grocery list to go with  that menu. Consider meals that easily stretch into a night of leftovers, such as stews or  casseroles. Or consider making two of your favorite meal and put one in the freezer for  another night. Try a night or two each week that is "meatless" or "no cook" such as salads.  When you get home from the grocery store wash and prepare your vegetables and fruits.  Then when you need them they are ready to go.  Tips for going to the grocery store: . Buy store or generic brands . Check the weekly ad from your store on-line or in their in-store flyer . Look at the unit price on the shelf tag to compare/contrast the costs of different items . Buy fruits/vegetables in season . Carrots, bananas and apples are low-cost, naturally healthy items . If meats or frozen vegetables are on sale, buy some extras and put in your freezer . Limit buying prepared or "ready to eat" items, even if they are pre-made salads or fruit snacks . Do not shop when you're hungry . Foods at eye level tend to be more expensive. Look on the high and low shelves for deals. . Consider shopping at the farmer's market for fresh foods in season. . Choose canned tuna or salmon instead of fresh . Avoid the cookie and chip aisles (these are expensive, high in calories and low in  nutritional value) Healthy food preparations: . If you can't get lean hamburger, be sure to drain the fat when cooking . Steam, saut (in olive oil), grill or bake foods . Experiment with different seasonings to avoid adding salt to your foods. Kosher salt, sea salt and [place] salt are all still salt and should be avoided         Resources: American Heart Association - InstantFinish.fi Go to the Healthy Living tab  to get more information American Diabetes Association - www.diabetes.org You don't have to be diabetic - check out the Food and Fitness tab  DASH diet - https://wilson-eaton.com/ Health topics - or just search on their home page for DASH Quit for Life - www.cancer.org Follow the Stay Healthy tab to learn more about smoking cessatio

## 2021-01-21 NOTE — Telephone Encounter (Signed)
Patient is aware and agreeable to Home Sleep Study through [facility]. Patient is scheduled for 02/18/21 at 2 pm to pick up home sleep kit and meet with Respiratory therapist at [facility]. Patient is aware that if this appointment date and time does not work for them they should contact [facility] directly at [phone]. Patient is aware that a sleep packet will be sent from [facility] in week. Patient is agreeable to treatment and thankful for call.  

## 2021-01-21 NOTE — Progress Notes (Signed)
Patient ID: JAROME TRULL                 DOB: [birth year]                      MRN: [id number]     HPI: USAMA HARKLESS is a 55 y.o. male referred by Dr. Radford Pax to HTN clinic. PMH is significant for right carotid artery stenosis (s/p RCEA 8 months ago and now with occluded RICA and 7-25% LICA by dopplers 02/6643), GERD, HLD, HTN and CVA.   Patient has been struggling with difficult to control HTN. HCTZ was switched to chlorthalidone, but had to be stopped due to bump in scr. His valsartan was stopped in order to check renin-aldosterone panel. Hydralazine 25mg  three times a day was started. Increased to 50mg  three times a day when BP was in the 170-180's. He was having headaches. BP came down to the 150's on 50mg  three times a day.   Patient presents today for follow up. He is a very pleasant gentleman. He reports that he started taking his carvedilol three times a day to help get his blood pressure down so he could go back to work. BP improved to the 140's. BP in office today was 144/76 and improved to 130/76 on repeat. He is taking his hydralazine at 6 AM, noon and 8PM. He occassionally has some lightheadedness when he stands up. Headaches are gone. Check BP multiple times a day. About the same no matter what time a day. 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 Does not really exercise. Snacks at night. Wife is trying to cook low sodium.  Current HTN meds: amlodipine 10mg  daily, carvedilol 25mg  twice a day, hydralazine 50mg  three times a day Previously tried: valsartan (stopped to repeat renin/aldosterone), chlorthalidone (increased scr), HCTZ (switched to chlorthalidone) BP goal: <130/80  Family History:  The patient's family history includes Colon cancer in his paternal uncle; Diabetes in his brother and sister; Diabetes (age of onset: 50) in his father; Hypertension in his brother, mother, and sister; Prostate cancer in his paternal grandfather  Social History: former smoker  Diet:  breakfast: grits from [place] Lunch: Occupational psychologist: wife has stopped using salt, baked chicken, green beans, rice, baked pork chop, broccoli, mashed potatoes Stopped eating chip Salsa and tortilla chips Popcorn Drink: sprite, water, 2 cups of coffee  Exercise: none  Home BP readings: 157/88, 158/89  Wt Readings from Last 3 Encounters:  12/24/20 248 lb 2 oz (112.5 kg)  12/21/20 246 lb (111.6 kg)  11/25/20 248 lb (112.5 kg)   BP Readings from Last 3 Encounters:  12/24/20 (!) 151/90  12/21/20 (!) 146/92  11/25/20 116/62   Pulse Readings from Last 3 Encounters:  12/24/20 71  12/21/20 61  11/25/20 (!) 46    Renal function: CrCl cannot be calculated (Patient's most recent lab result is older than the maximum 21 days allowed.).  Past Medical History:  Diagnosis Date  . Carotid artery stenosis    s/p RCEA now with occluded RICA and 0-34% LICA stenosis by dopplers 10/2020  . Chronic back pain    see surgeries   . GERD (gastroesophageal reflux disease)   . H/O: gout   . Hyperlipidemia   . Hypertension   . PPD positive    Positive PPD, remotely ~2000, s/p Rx    . Pre-diabetes   . Primary osteoarthritis of right hip    end stage  . Stroke [facility])    "  mild"  . Wears dentures     Current Outpatient Medications on File Prior to Visit  Medication Sig Dispense Refill  . amLODipine (NORVASC) 10 MG tablet Take 1 tablet (10 mg total) by mouth daily. 30 tablet 0  . aspirin EC 81 MG EC tablet Take 1 tablet (81 mg total) by mouth daily.    Marland Kitchen atorvastatin (LIPITOR) 20 MG tablet Take 1 tablet (20 mg total) by mouth at bedtime. 90 tablet 1  . buprenorphine (SUBUTEX) 8 MG SUBL SL tablet Place 8 mg under the tongue 2 (two) times a day.     . carvedilol (COREG) 25 MG tablet Take 1 tablet (25 mg total) by mouth 2 (two) times daily with a meal. 60 tablet 0  . cimetidine (TAGAMET) 300 MG tablet Take 1 tablet (300 mg total) by mouth 2 (two) times daily. 60 tablet 6  . clopidogrel  (PLAVIX) 75 MG tablet TAKE 1 TABLET BY MOUTH  DAILY 90 tablet 3  . hydrALAZINE (APRESOLINE) 50 MG tablet Take 1 tablet (50 mg total) by mouth 3 (three) times daily. 270 tablet 3  . metoprolol tartrate (LOPRESSOR) 100 MG tablet Take 1 tablet (100 mg total) by mouth once for 1 dose. Take 1 tablet (100 mg total) two hours prior to CT scan 1 tablet 0  . Multiple Vitamin (MULTIVITAMIN WITH MINERALS) TABS Take 1 tablet by mouth daily.     No current facility-administered medications on file prior to visit.    No Known Allergies   Assessment/Plan:  1. Hypertension - Blood pressure is close to goal of <130/80 in clinic today. I have asked the patient to resume taking carvedilol only twice a day. Although not a dangerous dose (can use up to 50mg  BID in HF) that dose is not indicated to HTN. For the weekend he will increase hydralazine to 75mg  three times a day. I will call patient on Monday with his lab work. As long as scr is back to baseline, will most likely resume valsartan.  Discussed the importance of exercise with patient and encouraged him to start to be active to improve his blood pressure and overall heath. Suggested that he pack his lunch and breakfast instead of eating out to avoid excessive Na intake at fast food restaurants. The grits he has been eating for breakfast has 440mg  of Na in them. We talked about foods high in Na. I have encouraged him to fast 12 hours everyday from dinner to breakfast the next day. We discussed the health benefits, including BP benefits this can have. Follow up in clinic in 2 weeks. He is aware that I will not be here that day and is ok with seeing another pharmacist. Patient is only off on Fridays.   Ramond Dial, Pharm.D, BCPS, CPP [place]  [address] [place], [place] [address]  Phone: [phone]; Fax: [phone]

## 2021-01-23 MED ORDER — HYDRALAZINE HCL 50 MG PO TABS: 75.0000 mg | ORAL_TABLET | Freq: Three times a day (TID) | ORAL | 3 refills | Status: DC

## 2021-01-26 LAB — RENIN: Renin: 1.466 ng/mL/hr (ref 0.167–5.380)

## 2021-01-27 ENCOUNTER — Telehealth: Payer: Self-pay | Admitting: Pharmacist

## 2021-01-27 LAB — SPECIMEN STATUS REPORT

## 2021-01-27 LAB — BASIC METABOLIC PANEL
BUN/Creatinine Ratio: 10 (ref 9–20)
BUN: 11 mg/dL (ref 6–24)
CO2: 19 mmol/L — ABNORMAL LOW (ref 20–29)
Calcium: 9.4 mg/dL (ref 8.7–10.2)
Chloride: 104 mmol/L (ref 96–106)
Creatinine, Ser: 1.07 mg/dL (ref 0.76–1.27)
GFR calc Af Amer: 90 mL/min/{1.73_m2} (ref >59)
GFR calc non Af Amer: 78 mL/min/{1.73_m2} (ref >59)
Glucose: 110 mg/dL — ABNORMAL HIGH (ref 65–99)
Potassium: 4.3 mmol/L (ref 3.5–5.2)
Sodium: 138 mmol/L (ref 134–144)

## 2021-01-27 LAB — ALDOSTERONE: ALDOSTERONE: 1.1 ng/dL (ref 0.0–30.0)

## 2021-01-27 NOTE — Telephone Encounter (Signed)
Spoke to patient. He was made aware of below information

## 2021-01-27 NOTE — Telephone Encounter (Signed)
Renin is normal and scr has returned to baseline. Will resume valsartan 320mg  daily. I will have him decrease his hydralazine to 25mg  TID for now and reevaluate its need after full effect of valstartan kicks in. LVM for pt to call back

## 2021-01-28 MED ORDER — VALSARTAN 320 MG PO TABS: 320.0000 mg | ORAL_TABLET | Freq: Every day | ORAL | 3 refills | Status: DC

## 2021-01-28 NOTE — Addendum Note (Signed)
Addended by: Marcelle Overlie D on: 01/28/2021 07:16 AM   Modules accepted: Orders

## 2021-02-03 NOTE — Patient Instructions (Addendum)
It was nice meeting you today  We would like your blood pressure to be less than 130/80  Morning:  Carvedilol 25mg   Valsartan 320mg  Hydralazine 25 mg (1/2 of the 50mg  tablet) Amlodipine 10mg   Afternoon:  Hydralazine 50 mg (1 whole tablet).  If blood pressure greater than 140/90 for 3 straight days, increase to 75mg  (1 and 1/2 tablets)  Evening:  Carvedilol 25mg  Hydralazine 25 mg (1/2 of the 50mg  tablet)  Try to cut down on the fast food and processed foods  Karren Cobble, PharmD, BCACP, [place], [place] [address], [place], [place] [address] Phone: [phone]; Fax: [phone] 02/04/2021 1:48 PM

## 2021-02-04 ENCOUNTER — Other Ambulatory Visit: Payer: Self-pay

## 2021-02-04 ENCOUNTER — Ambulatory Visit (INDEPENDENT_AMBULATORY_CARE_PROVIDER_SITE_OTHER): Payer: 59 | Admitting: Pharmacist

## 2021-02-04 ENCOUNTER — Encounter: Payer: Self-pay | Admitting: Pharmacist

## 2021-02-04 VITALS — BP 164/96 | HR 73

## 2021-02-04 DIAGNOSIS — I10 Essential (primary) hypertension: Secondary | ICD-10-CM | POA: Diagnosis not present

## 2021-02-04 NOTE — Progress Notes (Signed)
Patient ID: Mike Shepard                 DOB: [birth year]                      MRN: [id number]     HPI: Mike Shepard is a 55 y.o. male referred by Dr. Radford Pax to HTN clinic. PMH is significant for right carotid artery stenosis (s/p RCEA 8 months ago and now withoccluded RICA and 9-38% LICA by dopplers 12/173), GERD, HLD, HTN and CVA.   Patient presents today in good spirits.  This is his second visit to HTN clinic and has had multiple med changes due to difficult to manage BP. Thiazides have been stopped due to bump in Scr. He reports swelling in lower extremities but it is not bothersome.  Does not remember if LEE was before starting amlodipine or not but does think its worse since stopping HCTZ and chlorthalidone.    Currently takes carvedilol, valsartan, hydralazine and amlodipine in the morning at 5 AM then checks BP at 8AM. Reports is is usually around 115-120/80.  At 11:30 he takes his midday dose of hydralazine but noticed it was not bringing his BP down.  Increased his midday hydralazine dose to 50mg  and it was slightly more effective. Reports reading of 140/75.  Takes evening meds around 5/6PM and has readings of 125-130/80.  Patient pleased with progress but is concerned  About his midday readings which is why he increased his hydralazine dose.  Uses smokeless tobacco but does not drink alcohol.  Is working on cutting down on sodium in diet but still will eat fast food.  Has sleep study scheduled for 3/4.  Uses Omron upper arm cuff .  Has not taken midday hydralazine yet.  Current HTN meds: Carvedilol 25mg  BID, valsartan 320mg  daily, hydralazine 25mg  (AM and PM), hydralazine 50mg  @ noon, amlodipine 10mg  daily Previously tried: HCTZ, Chlorthalidone (Scr bump) BP goal: <130/80  Family History: MI in father  Social History: smokeless tobacco, no alcohol  Exercise: physically active, on feet  Wt Readings from Last 3 Encounters:  12/24/20 248 lb 2 oz (112.5 kg)  12/21/20  246 lb (111.6 kg)  11/25/20 248 lb (112.5 kg)   BP Readings from Last 3 Encounters:  01/21/21 130/76  12/24/20 (!) 151/90  12/21/20 (!) 146/92   Pulse Readings from Last 3 Encounters:  01/21/21 73  12/24/20 71  12/21/20 61    Renal function: CrCl cannot be calculated (Unknown ideal weight.).  Past Medical History:  Diagnosis Date  . Carotid artery stenosis    s/p RCEA now with occluded RICA and 1-02% LICA stenosis by dopplers 10/2020  . Chronic back pain    see surgeries   . GERD (gastroesophageal reflux disease)   . H/O: gout   . Hyperlipidemia   . Hypertension   . PPD positive    Positive PPD, remotely ~2000, s/p Rx    . Pre-diabetes   . Primary osteoarthritis of right hip    end stage  . Stroke [facility])    " mild"  . Wears dentures     Current Outpatient Medications on File Prior to Visit  Medication Sig Dispense Refill  . amLODipine (NORVASC) 10 MG tablet Take 1 tablet (10 mg total) by mouth daily. 30 tablet 0  . aspirin EC 81 MG EC tablet Take 1 tablet (81 mg total) by mouth daily.    Marland Kitchen atorvastatin (LIPITOR) 20 MG tablet Take 1  tablet (20 mg total) by mouth at bedtime. 90 tablet 1  . buprenorphine (SUBUTEX) 8 MG SUBL SL tablet Place 8 mg under the tongue 2 (two) times a day.     . carvedilol (COREG) 25 MG tablet Take 1 tablet (25 mg total) by mouth 2 (two) times daily with a meal. 60 tablet 0  . cimetidine (TAGAMET) 300 MG tablet Take 1 tablet (300 mg total) by mouth 2 (two) times daily. 60 tablet 6  . clopidogrel (PLAVIX) 75 MG tablet TAKE 1 TABLET BY MOUTH  DAILY 90 tablet 3  . hydrALAZINE (APRESOLINE) 50 MG tablet Take 0.5 tablets (25 mg total) by mouth 3 (three) times daily. 135 tablet 3  . metoprolol tartrate (LOPRESSOR) 100 MG tablet Take 1 tablet (100 mg total) by mouth once for 1 dose. Take 1 tablet (100 mg total) two hours prior to CT scan 1 tablet 0  . Multiple Vitamin (MULTIVITAMIN WITH MINERALS) TABS Take 1 tablet by mouth daily.    . valsartan (DIOVAN)  320 MG tablet Take 1 tablet (320 mg total) by mouth daily. 90 tablet 3   No current facility-administered medications on file prior to visit.    No Known Allergies   Assessment/Plan:  1. Hypertension - BP in room today 164/96 which is above goal of <130/80.  Patient has not taken midday hydralazine yet.  Judging by his self reported home readings, addition of three times a day hydralazine has been effective, however he likely needs a stronger dose midday.  Recommended he continue taking hydralazine 50mg  midday and if BP >140/90 for three consecutive days, increase midday hydralazine to 75mg .  Keep morning and AM medication regimen the same.  Patient HTN also possibly complicated by sleep apnea.  Has sleep study scheduled for 3/4.  Patient unaware of link between OSA and HTN.  Educated briefly on link and gave patient educational handouts.   Patient reports wife says he snores.  If diagnosed with OSA, likely can reduce pill burden.  Will recheck in clinic in 4 weeks after sleep study.    Continue valsartan 320mg  daily Continue amlodipine 10mg  daily Continue carvedilol 25mg  BID Begin hydralazine 25mg  morning and evening and 50mg  at midday.  Increase to 75 mg midday if BP >140/90 for 3 consecutive days Recheck in 4 weeks  Karren Cobble, PharmD, BCACP, [place], [place] [address], [place], [place] [address] Phone: [phone]; Fax: [phone] 02/04/2021 5:17 PM

## 2021-02-17 ENCOUNTER — Telehealth (HOSPITAL_COMMUNITY): Payer: Self-pay | Admitting: *Deleted

## 2021-02-17 NOTE — Telephone Encounter (Signed)
Attempted to call patient regarding upcoming cardiac CT appointment. °Left message on voicemail with name and callback number ° °Alondria Mousseau RN Navigator Cardiac Imaging °[facility] °[phone] Office °[phone] Cell ° °

## 2021-02-18 ENCOUNTER — Other Ambulatory Visit: Payer: Self-pay

## 2021-02-18 ENCOUNTER — Ambulatory Visit (HOSPITAL_COMMUNITY)
Admission: RE | Admit: 2021-02-18 | Discharge: 2021-02-18 | Disposition: A | Discharge status: Home / Self Care | Payer: 59 | Source: Ambulatory Visit | Attending: Cardiology | Admitting: Cardiology

## 2021-02-18 ENCOUNTER — Encounter (HOSPITAL_COMMUNITY): Payer: Self-pay

## 2021-02-18 ENCOUNTER — Ambulatory Visit (HOSPITAL_BASED_OUTPATIENT_CLINIC_OR_DEPARTMENT_OTHER): Payer: 59 | Attending: Cardiology

## 2021-02-18 DIAGNOSIS — I10 Essential (primary) hypertension: Secondary | ICD-10-CM | POA: Diagnosis not present

## 2021-02-18 DIAGNOSIS — R072 Precordial pain: Secondary | ICD-10-CM

## 2021-02-18 MED ORDER — METOPROLOL TARTRATE 5 MG/5ML IV SOLN
5.0000 mg | INTRAVENOUS | Status: DC | PRN
Start: 2021-02-18 — End: 2021-02-19

## 2021-02-18 MED ORDER — NITROGLYCERIN 0.4 MG SL SUBL
SUBLINGUAL_TABLET | SUBLINGUAL | Status: AC
  Filled 2021-02-18: qty 2

## 2021-02-18 MED ORDER — IOHEXOL 350 MG/ML SOLN
80.0000 mL | Freq: Once | INTRAVENOUS | Status: AC | PRN
  Administered 2021-02-18: 80 mL via INTRAVENOUS

## 2021-02-18 MED ORDER — NITROGLYCERIN 0.4 MG SL SUBL
0.8000 mg | SUBLINGUAL_TABLET | Freq: Once | SUBLINGUAL | Status: AC
  Administered 2021-02-18: 0.8 mg via SUBLINGUAL

## 2021-02-21 ENCOUNTER — Encounter: Payer: Self-pay | Admitting: Cardiology

## 2021-02-21 DIAGNOSIS — I700 Atherosclerosis of aorta: Secondary | ICD-10-CM | POA: Insufficient documentation

## 2021-02-23 ENCOUNTER — Other Ambulatory Visit: Payer: Self-pay

## 2021-02-24 ENCOUNTER — Other Ambulatory Visit (HOSPITAL_COMMUNITY): Payer: Self-pay | Admitting: *Deleted

## 2021-02-24 DIAGNOSIS — R079 Chest pain, unspecified: Secondary | ICD-10-CM

## 2021-02-25 ENCOUNTER — Ambulatory Visit (HOSPITAL_BASED_OUTPATIENT_CLINIC_OR_DEPARTMENT_OTHER): Payer: 59 | Attending: Cardiology | Admitting: Cardiology

## 2021-02-25 ENCOUNTER — Encounter: Payer: Self-pay | Admitting: Endocrinology

## 2021-02-25 ENCOUNTER — Other Ambulatory Visit: Payer: Self-pay

## 2021-02-25 ENCOUNTER — Ambulatory Visit: Payer: 59 | Admitting: Endocrinology

## 2021-02-25 ENCOUNTER — Other Ambulatory Visit: Payer: Self-pay | Admitting: Internal Medicine

## 2021-02-25 VITALS — BP 150/80 | HR 76 | Ht 72.0 in | Wt 249.4 lb

## 2021-02-25 DIAGNOSIS — I10 Essential (primary) hypertension: Secondary | ICD-10-CM | POA: Diagnosis not present

## 2021-02-25 DIAGNOSIS — G4733 Obstructive sleep apnea (adult) (pediatric): Secondary | ICD-10-CM | POA: Diagnosis not present

## 2021-02-25 NOTE — Patient Instructions (Signed)
Blood tests, and a 24HR urine collection, are requested for you today.  We'll let you know about the results.  Please come back for a follow-up appointment in 2 months.

## 2021-02-25 NOTE — Progress Notes (Signed)
Subjective:    Patient ID: Mike Shepard, male    DOB: 10-Nov-1966, 55 y.o.   MRN: [id number]  HPI Pt is referred by Dr Radford Pax, for possible pheochromocytoma.  He has h/o HTN 1997.  He has no h/o adrenal disease, migraine headache, alcohol withdrawal, neurofibromas, hemangiomas, brain aneurysm, cocaine use, or thyroid disease.  He takes 4 meds for HTN.  He has intermitt headache, sweating, and palpitations.  He is unable to cite precip or relieving factor.   Past Medical History:  Diagnosis Date   Aortic atherosclerosis ([place])    Carotid artery stenosis    s/p RCEA now with occluded RICA and 2-70% LICA stenosis by dopplers 10/2020   Chronic back pain    see surgeries    GERD (gastroesophageal reflux disease)    H/O: gout    Hyperlipidemia    Hypertension    PPD positive    Positive PPD, remotely ~2000, s/p Rx     Pre-diabetes    Primary osteoarthritis of right hip    end stage   Stroke [facility])    " mild"   Wears dentures     Past Surgical History:  Procedure Laterality Date   BACK SURGERY     2007-2013 (low back)   ENDARTERECTOMY Right 06/02/2019   Procedure: ENDARTERECTOMY CAROTID RIGHT;  Surgeon: Marty Heck, MD;  Location: [facility] OR;  Service: Vascular;  Laterality: Right;   INTRAOPERATIVE ARTERIOGRAM  06/02/2019   Procedure: Right carotid and cerebral angiogram;  Surgeon: Marty Heck, MD;  Location: [facility] OR;  Service: Vascular;;   MULTIPLE TOOTH EXTRACTIONS     NECK SURGERY     2010   OPERATIVE ULTRASOUND Right 06/02/2019   Procedure: Operative Ultrasound;  Surgeon: Marty Heck, MD;  Location: [facility] OR;  Service: Vascular;  Laterality: Right;    Social History   Socioeconomic History   Marital status: Married    Spouse name: Not on file   Number of children: 3   Years of education: Not on file   Highest education level: Not on file  Occupational History   Occupation: [employer] [place]  Tobacco Use   Smoking status:  Former Smoker    Types: Cigarettes    Quit date: 01/27/2010    Years since quitting: 11.0   Smokeless tobacco: Current User    Types: Chew   Tobacco comment: Dips, rec to see dentist!  Vaping Use   Vaping Use: Never used  Substance and Sexual Activity   Alcohol use: Never   Drug use: No    Comment: denies    Sexual activity: Yes    Birth control/protection: None  Other Topics Concern   Not on file  Social History Narrative   Lives w/ wife, 3 children plus adopted 3 children   Social Determinants of Health   Financial Resource Strain: Not on file  Food Insecurity: Not on file  Transportation Needs: Not on file  Physical Activity: Not on file  Stress: Not on file  Social Connections: Not on file  Intimate Partner Violence: Not on file    Current Outpatient Medications on File Prior to Visit  Medication Sig Dispense Refill   amLODipine (NORVASC) 10 MG tablet Take 1 tablet (10 mg total) by mouth daily. 30 tablet 0   aspirin EC 81 MG EC tablet Take 1 tablet (81 mg total) by mouth daily.     atorvastatin (LIPITOR) 20 MG tablet Take 1 tablet (20 mg total) by mouth at  bedtime. 90 tablet 1   buprenorphine (SUBUTEX) 2 MG SUBL SL tablet Place 2 tablets under the tongue in the morning and at bedtime.     clopidogrel (PLAVIX) 75 MG tablet TAKE 1 TABLET BY MOUTH  DAILY 90 tablet 3   Multiple Vitamin (MULTIVITAMIN WITH MINERALS) TABS Take 1 tablet by mouth daily.     valsartan (DIOVAN) 320 MG tablet Take 1 tablet (320 mg total) by mouth daily. 90 tablet 3   cimetidine (TAGAMET) 300 MG tablet Take 1 tablet (300 mg total) by mouth 2 (two) times daily. (Patient not taking: Reported on 02/04/2021) 60 tablet 6   hydrALAZINE (APRESOLINE) 50 MG tablet Take 0.5 tablets (25 mg total) by mouth 3 (three) times daily. 135 tablet 3   No current facility-administered medications on file prior to visit.    No Known Allergies  Family History  Problem Relation Age of Onset    Hypertension Mother        M,F,Sis, bro   Diabetes Father 85       CAD, died of MI   Diabetes Sister    Hypertension Sister    Diabetes Brother    Hypertension Brother    Prostate cancer Paternal Grandfather        dx ~ 44 y/o   Colon cancer Paternal Uncle    Anesthesia problems Neg Hx    CAD Neg Hx    Stroke Neg Hx    Stomach cancer Neg Hx    Esophageal cancer Neg Hx    Pancreatic cancer Neg Hx    Adrenal disorder Neg Hx     BP (!) 150/80 (BP Location: Right Arm, Patient Position: Sitting, Cuff Size: Large)    Pulse 76    Ht 6' (1.829 m)    Wt 249 lb 6.4 oz (113.1 kg)    SpO2 98%    BMI 33.82 kg/m     Review of Systems denies flushing, pallor, n/v, syncope, diarrhea, weight loss, chest pain, sob, anxiety, and fever.      Objective:   Physical Exam VS: see vs page GEN: no distress HEAD: head: no deformity eyes: no periorbital swelling, no proptosis external nose and ears are normal Mouth: no neurofibromas of the tongue.   NECK: supple, thyroid is not enlarged.   CHEST WALL: no deformity LUNGS: clear to auscultation CV: reg rate and rhythm; soft syst murmur.  MUSCULOSKELETAL: gait is normal and steady EXTEMITIES: no deformity.  no leg edema NEURO:  readily moves all 4's.  sensation is intact to touch on all 4's SKIN:  Normal texture and temperature.  No rash or suspicious lesion is visible.   NODES:  None palpable at the neck PSYCH: alert, well-oriented.  Does not appear anxious nor depressed.   CT (2012): adrenals are normal.   PRA and aldo=normal 24HR VMA=8 (<7) Normetanephrine, 24H Ur=1153 (<729) Norepinephrine, 24H Ur=148 (<135)  I have reviewed outside records, and summarized: Pt was noted to have elevated catechols, and referred here.  HTN was not well-controlled      Assessment & Plan:  HTN: uncontrolled.  We should do further testing to exclude pheochromocytoma.  I told pt that even if these tests are normal, we are happy to recheck in  the future.    Patient Instructions  Blood tests, and a 24HR urine collection, are requested for you today.  We'll let you know about the results.  Please come back for a follow-up appointment in 2 months.

## 2021-02-28 NOTE — Procedures (Signed)
    Patient Name: Mike Shepard, Mike Shepard Date: 02/27/2021 Gender: Male D.O.B: August 27, 1966 Age (years): 18 Referring Provider: Fransico Him MD, ABSM Height (inches): 72 Interpreting Physician: Fransico Him MD, ABSM Weight (lbs): 248 RPSGT: Jacolyn Reedy BMI: 34 MRN: [id number] Neck Size: 17.50  CLINICAL INFORMATION Sleep Study Type: HST  Indication for sleep study: N/A  Epworth Sleepiness Score: 14  SLEEP STUDY TECHNIQUE A multi-channel overnight portable sleep study was performed. The channels recorded were: nasal airflow, thoracic respiratory movement, and oxygen saturation with a pulse oximetry. Snoring was also monitored.  MEDICATIONS Patient self administered medications include: N/A.  SLEEP ARCHITECTURE Patient was studied for 362.6 minutes. The sleep efficiency was 100.0 % and the patient was supine for 60%. The arousal index was 0.0 per hour.  RESPIRATORY PARAMETERS The overall AHI was 6.6 per hour, with a central apnea index of 0 per hour.  The oxygen nadir was 88% during sleep.  CARDIAC DATA Mean heart rate during sleep was 53.8 bpm.  IMPRESSIONS - Mild obstructive sleep apnea occurred during this study (AHI = 6.6/h). - Mild oxygen desaturation was noted during this study (Min O2 = 88%). - Patient snored 37.9% during the sleep.  DIAGNOSIS - Obstructive Sleep Apnea (G47.33)  RECOMMENDATIONS - Recommend auto CPAP from 4 to 15cm H2O with heated humidity and mask of choice.  - Positional therapy avoiding supine position during sleep. - Avoid alcohol, sedatives and other CNS depressants that may worsen sleep apnea and disrupt normal sleep architecture. - Sleep hygiene should be reviewed to assess factors that may improve sleep quality. - Weight management and regular exercise should be initiated or continued.  [Electronically signed] 02/28/2021 09:23 PM  Fransico Him MD, ABSM Diplomate, American Board of Sleep Medicine

## 2021-02-28 NOTE — Progress Notes (Signed)
    Patient Name: Mike Shepard, Scholer Date: 02/27/2021 Gender: Male D.O.B: [birth year] Age (years): 39 Referring Provider: Fransico Him MD, ABSM Height (inches): 72 Interpreting Physician: Fransico Him MD, ABSM Weight (lbs): 248 RPSGT: Jacolyn Reedy BMI: 34 MRN: [id number] Neck Size: 17.50  CLINICAL INFORMATION Sleep Study Type: HST  Indication for sleep study: N/A  Epworth Sleepiness Score: 14  SLEEP STUDY TECHNIQUE A multi-channel overnight portable sleep study was performed. The channels recorded were: nasal airflow, thoracic respiratory movement, and oxygen saturation with a pulse oximetry. Snoring was also monitored.  MEDICATIONS Patient self administered medications include: N/A.  SLEEP ARCHITECTURE Patient was studied for 362.6 minutes. The sleep efficiency was 100.0 % and the patient was supine for 60%. The arousal index was 0.0 per hour.  RESPIRATORY PARAMETERS The overall AHI was 6.6 per hour, with a central apnea index of 0 per hour.  The oxygen nadir was 88% during sleep.  CARDIAC DATA Mean heart rate during sleep was 53.8 bpm.  IMPRESSIONS - Mild obstructive sleep apnea occurred during this study (AHI = 6.6/h). - Mild oxygen desaturation was noted during this study (Min O2 = 88%). - Patient snored 37.9% during the sleep.  DIAGNOSIS - Obstructive Sleep Apnea (G47.33)  RECOMMENDATIONS - Recommend auto CPAP from 4 to 15cm H2O with heated humidity and mask of choice.  - Positional therapy avoiding supine position during sleep. - Avoid alcohol, sedatives and other CNS depressants that may worsen sleep apnea and disrupt normal sleep architecture. - Sleep hygiene should be reviewed to assess factors that may improve sleep quality. - Weight management and regular exercise should be initiated or continued.  [Electronically signed] 02/28/2021 09:23 PM  Fransico Him MD, ABSM Diplomate, American Board of Sleep Medicine

## 2021-03-01 ENCOUNTER — Telehealth: Payer: Self-pay

## 2021-03-01 DIAGNOSIS — R072 Precordial pain: Secondary | ICD-10-CM

## 2021-03-01 DIAGNOSIS — I701 Atherosclerosis of renal artery: Secondary | ICD-10-CM

## 2021-03-01 NOTE — Telephone Encounter (Signed)
Left message for patient to call back  

## 2021-03-01 NOTE — Telephone Encounter (Signed)
-----   Message from [name], MD sent at 02/28/2021  2:58 PM EDT ----- Coronary CTA was poor quality and not able to be read.  Please order a nuclear stress test Lexiscan - I have told Clarise Cruz to cancel repeat coronary CTA

## 2021-03-02 LAB — METANEPHRINES, PLASMA
Metanephrine, Free: 74 pg/mL — ABNORMAL HIGH (ref <=57)
Normetanephrine, Free: 264 pg/mL — ABNORMAL HIGH (ref <=148)
Total Metanephrines-Plasma: 338 pg/mL — ABNORMAL HIGH (ref <=205)

## 2021-03-02 NOTE — Telephone Encounter (Signed)
[name], MD  02/21/2021 9:04 AM EST      There is at least mild to moderate renal artery stenosis but study limited by bowel gas and may be more significant. Please refer to Dr. Gwenlyn Found or Fletcher Anon for further evalation   The patient has been notified of the result and verbalized understanding.  All questions (if any) were answered. Antonieta Iba, RN 03/02/2021 5:18 PM   Mike Shepard has been ordered. Referral has been placed.

## 2021-03-03 NOTE — Telephone Encounter (Signed)
Shared Decision Making/Informed Consent  The risks [chest pain, shortness of breath, cardiac arrhythmias, dizziness, blood pressure fluctuations, myocardial infarction, stroke/transient ischemic attack, nausea, vomiting, allergic reaction, radiation exposure, metallic taste sensation and life-threatening complications (estimated to be 1 in 10,000)], benefits (risk stratification, diagnosing coronary artery disease, treatment guidance) and alternatives of a nuclear stress test were discussed in detail with Mike Shepard and he agrees to proceed.

## 2021-03-04 ENCOUNTER — Ambulatory Visit: Payer: 59

## 2021-03-04 NOTE — Progress Notes (Deleted)
Patient ID: Mike Shepard                 DOB: 10/19/1966                      MRN: [id number]     HPI: Mike Shepard is a 55 y.o. male referred by Dr. Radford Pax to HTN clinic. PMH is significant for right carotid artery stenosis (s/p RCEA 8 months ago and now withoccluded RICA and 2-09% LICA by dopplers 03/7095), GERD, HLD, HTN and CVA.   At last HTN visit he reported AM and PM BP within range, but elevated midday BP. He had been taking 50mg  of hydralazine at mid day and 25mg  in AM and PM. He had a renal doppler that showed 1-59% bilateral renal artery stenosis, possible higher due to bowl gas. He is seeing endocrinology for a pheochromocytoma work up. Sleep study showed mild OSA and CPAP was recommended. Leane Call has been ordered as well.  Uses Omron upper arm cuff . Calibrated? Needs BMP Exercise? Dose of hydralazine? Swelling? Dizziness, lightheadedness, headache, blurred vision, SOB, swelling    Current HTN meds: Carvedilol 25mg  BID, valsartan 320mg  daily, hydralazine 25mg  (AM and PM), hydralazine 50mg  @ noon, amlodipine 10mg  daily Previously tried: HCTZ, Chlorthalidone (Scr bump) BP goal: <130/80  Family History: MI in father  Social History: smokeless tobacco, no alcohol  Exercise: physically active, on feet  Wt Readings from Last 3 Encounters:  02/25/21 249 lb 6.4 oz (113.1 kg)  12/24/20 248 lb 2 oz (112.5 kg)  12/21/20 246 lb (111.6 kg)   BP Readings from Last 3 Encounters:  02/25/21 (!) 150/80  02/18/21 133/76  02/04/21 (!) 164/96   Pulse Readings from Last 3 Encounters:  02/25/21 76  02/18/21 62  02/04/21 73    Renal function: CrCl cannot be calculated (Patient's most recent lab result is older than the maximum 21 days allowed.).  Past Medical History:  Diagnosis Date  . Aortic atherosclerosis ([place])   . Carotid artery stenosis    s/p RCEA now with occluded RICA and 2-83% LICA stenosis by dopplers 10/2020  . Chronic back pain    see  surgeries   . GERD (gastroesophageal reflux disease)   . H/O: gout   . Hyperlipidemia   . Hypertension   . PPD positive    Positive PPD, remotely ~2000, s/p Rx    . Pre-diabetes   . Primary osteoarthritis of right hip    end stage  . Stroke [facility])    " mild"  . Wears dentures     Current Outpatient Medications on File Prior to Visit  Medication Sig Dispense Refill  . amLODipine (NORVASC) 10 MG tablet Take 1 tablet (10 mg total) by mouth daily. 30 tablet 0  . aspirin EC 81 MG EC tablet Take 1 tablet (81 mg total) by mouth daily.    Marland Kitchen atorvastatin (LIPITOR) 20 MG tablet Take 1 tablet (20 mg total) by mouth at bedtime. 90 tablet 1  . buprenorphine (SUBUTEX) 2 MG SUBL SL tablet Place 2 tablets under the tongue in the morning and at bedtime.    . carvedilol (COREG) 25 MG tablet Take 1 tablet (25 mg total) by mouth 2 (two) times daily with a meal. 180 tablet 0  . cimetidine (TAGAMET) 300 MG tablet Take 1 tablet (300 mg total) by mouth 2 (two) times daily. (Patient not taking: Reported on 02/04/2021) 60 tablet 6  . clopidogrel (PLAVIX) 75 MG tablet  TAKE 1 TABLET BY MOUTH  DAILY 90 tablet 3  . hydrALAZINE (APRESOLINE) 50 MG tablet Take 0.5 tablets (25 mg total) by mouth 3 (three) times daily. 135 tablet 3  . Multiple Vitamin (MULTIVITAMIN WITH MINERALS) TABS Take 1 tablet by mouth daily.    . valsartan (DIOVAN) 320 MG tablet Take 1 tablet (320 mg total) by mouth daily. 90 tablet 3   No current facility-administered medications on file prior to visit.    No Known Allergies   Assessment/Plan:  1. Hypertension -  Will recheck in clinic in 4 weeks after sleep study.    Continue valsartan 320mg  daily Continue amlodipine 10mg  daily Continue carvedilol 25mg  BID Begin hydralazine 25mg  morning and evening and 50mg  at midday.  Increase to 75 mg midday if BP >140/90 for 3 consecutive days Recheck in 4 weeks  Melissa D Maccia, Pharm.D, BCPS, CPP [place]  [address], [place], [place] [address]  Phone: [phone]; Fax: [phone]  03/04/2021 9:26 AM

## 2021-03-07 ENCOUNTER — Telehealth: Payer: Self-pay | Admitting: *Deleted

## 2021-03-07 DIAGNOSIS — G4733 Obstructive sleep apnea (adult) (pediatric): Secondary | ICD-10-CM

## 2021-03-07 NOTE — Telephone Encounter (Signed)
-----   Message from [name], MD sent at 02/28/2021  9:28 PM EDT ----- Please let patient know that they have sleep apnea.  Order ResMed auto CPAP from 4 to 20cm H2O with heated humidity and mask of choice. Followup with me in 6 weeks.

## 2021-03-07 NOTE — Telephone Encounter (Signed)
Informed patient of sleep study results and patient understanding was verbalized. Patient understands his they have sleep apnea. Dr Radford Pax says Order ResMed auto CPAP from 4 to 20cm H2O with heated humidity and mask of choice. Followup with me in 6 weeks.    Upon patient request DME selection is [facility]. Patient understands she/he will be contacted by [facility] to set up her/he cpap. Patient understands to call if [facility] does not contact her/he with new setup in a timely manner. Patient understands they will be called once confirmation has been received from [facility] that they have received their new machine to schedule 10 week follow up appointment.   [facility] notified of new cpap order  Please add to airview Left detailed message on voicemail and informed patient to call back with questions.

## 2021-03-09 ENCOUNTER — Telehealth (HOSPITAL_COMMUNITY): Payer: Self-pay | Admitting: *Deleted

## 2021-03-09 NOTE — Telephone Encounter (Signed)
Patient given detailed instructions per Myocardial Perfusion Study Information Sheet for the test on 03/11/21 at 7:30. Patient notified to arrive 15 minutes early and that it is imperative to arrive on time for appointment to keep from having the test rescheduled.  If you need to cancel or reschedule your appointment, please call the office within 24 hours of your appointment. . Patient verbalized understanding.Mike Shepard

## 2021-03-11 ENCOUNTER — Other Ambulatory Visit: Payer: Self-pay

## 2021-03-11 ENCOUNTER — Ambulatory Visit (HOSPITAL_COMMUNITY): Payer: 59 | Attending: Cardiology

## 2021-03-11 DIAGNOSIS — R072 Precordial pain: Secondary | ICD-10-CM | POA: Diagnosis present

## 2021-03-11 LAB — MYOCARDIAL PERFUSION IMAGING
LV dias vol: 112 mL (ref 62–150)
LV sys vol: 40 mL
Peak HR: 93 {beats}/min
Rest HR: 67 {beats}/min
SDS: 1
SRS: 0
SSS: 1
TID: 1.06

## 2021-03-11 MED ORDER — TECHNETIUM TC 99M TETROFOSMIN IV KIT
30.3000 | PACK | Freq: Once | INTRAVENOUS | Status: AC | PRN
  Administered 2021-03-11: 30.3 via INTRAVENOUS
  Filled 2021-03-11: qty 31

## 2021-03-11 MED ORDER — TECHNETIUM TC 99M TETROFOSMIN IV KIT
10.2000 | PACK | Freq: Once | INTRAVENOUS | Status: AC | PRN
  Administered 2021-03-11: 10.2 via INTRAVENOUS
  Filled 2021-03-11: qty 11

## 2021-03-11 MED ORDER — REGADENOSON 0.4 MG/5ML IV SOLN
0.4000 mg | Freq: Once | INTRAVENOUS | Status: AC
  Administered 2021-03-11: 0.4 mg via INTRAVENOUS

## 2021-03-14 LAB — CATECHOLAMINES, FRACTIONATED, PLASMA
Dopamine: 48 pg/mL — ABNORMAL HIGH
Epinephrine: 172 pg/mL — ABNORMAL HIGH

## 2021-03-14 LAB — METANEPHRINES, PLASMA

## 2021-03-25 ENCOUNTER — Ambulatory Visit: Payer: 59 | Admitting: Cardiovascular Disease

## 2021-03-25 ENCOUNTER — Encounter: Payer: 59 | Admitting: Internal Medicine

## 2021-04-04 ENCOUNTER — Ambulatory Visit: Payer: 59 | Admitting: Cardiology

## 2021-04-29 ENCOUNTER — Ambulatory Visit: Payer: 59 | Admitting: Endocrinology

## 2021-04-29 ENCOUNTER — Encounter: Payer: Self-pay | Admitting: Cardiovascular Disease

## 2021-04-29 ENCOUNTER — Other Ambulatory Visit: Payer: Self-pay

## 2021-04-29 ENCOUNTER — Ambulatory Visit: Payer: 59 | Admitting: Cardiovascular Disease

## 2021-04-29 VITALS — BP 150/84 | HR 51 | Ht 72.0 in | Wt 241.4 lb

## 2021-04-29 DIAGNOSIS — I10 Essential (primary) hypertension: Secondary | ICD-10-CM | POA: Diagnosis not present

## 2021-04-29 NOTE — Patient Instructions (Signed)
Medication Instructions:  No Changes In Medications at this time.  *If you need a refill on your cardiac medications before your next appointment, please call your pharmacy*  Follow-Up: At [facility], you and your health needs are our priority.  As part of our continuing mission to provide you with exceptional heart care, we have created designated Provider Care Teams.  These Care Teams include your primary Cardiologist (physician) and Advanced Practice Providers (APPs -  Physician Assistants and Nurse Practitioners) who all work together to provide you with the care you need, when you need it.  Your next appointment:   AS NEEDED   The format for your next appointment:   In Person  Provider:   Jonathan Berry, MD  

## 2021-04-29 NOTE — Assessment & Plan Note (Signed)
Mr. Mike Shepard was referred to me by Dr. Radford Pax for evaluation of question renal vascular hypertension.  He does have essential hypertension on 3 antihypertensive medications including amlodipine, carvedilol and valsartan on maximum doses.  His blood pressure today is 150/84 which is fairly consistent with his blood pressure measurements at home.  Renal Dopplers performed 02/18/2021 did not suggest that he had renal artery stenosis with renal aortic ratios in the mid 2 range bilaterally.  I suspect his hypertension is essential.  He may benefit from the addition of a low-dose diuretic such as chlorthalidone.

## 2021-04-29 NOTE — Progress Notes (Signed)
04/29/2021 MARVENS HOLLARS   May 13, 1966  [id number]  Primary Physician Colon Branch, MD Primary Cardiologist: Lorretta Harp MD Lupe Carney, [place]  HPI:  Mike Shepard is a 55 y.o. 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  His primary care provider is Dr. Larose Kells.  He was referred by Dr. Radford Pax for peripheral vascular evaluation because of poorly controlled hypertension with recent right renal Doppler studies.  He does have a history of hypertension and hyperlipidemia on medical therapy.  His father died of a myocardial infarction at age 34.  He has had quite a "small stroke in the past" and had TCAR performed by Dr. Fortunato Curling 06/06/2019.  He works out at [employer] 2 to 3 days a week without limitation.  Renal Doppler studies performed 02/18/2021 showed renal aortic ratio seven 2.4 on the right and 2.15 on the left suggesting that he did not have renal artery stenosis.   Current Meds  Medication Sig  . amLODipine (NORVASC) 10 MG tablet Take 1 tablet (10 mg total) by mouth daily.  Marland Kitchen aspirin EC 81 MG EC tablet Take 1 tablet (81 mg total) by mouth daily.  Marland Kitchen atorvastatin (LIPITOR) 20 MG tablet Take 1 tablet (20 mg total) by mouth at bedtime.  . buprenorphine (SUBUTEX) 2 MG SUBL SL tablet Place 2 tablets under the tongue in the morning and at bedtime.  . carvedilol (COREG) 25 MG tablet Take 1 tablet (25 mg total) by mouth 2 (two) times daily with a meal.  . clopidogrel (PLAVIX) 75 MG tablet TAKE 1 TABLET BY MOUTH  DAILY  . Multiple Vitamin (MULTIVITAMIN WITH MINERALS) TABS Take 1 tablet by mouth daily.  . valsartan (DIOVAN) 320 MG tablet Take 1 tablet (320 mg total) by mouth daily.     No Known Allergies  Social History   Socioeconomic History  . Marital status: Married    Spouse name: Not on file  . Number of children: 3  . Years of education: Not on  file  . Highest education level: Not on file  Occupational History  . Occupation: [employer] [place] ([employer])  Tobacco Use  . Smoking status: Former Smoker    Types: Cigarettes    Quit date: 01/27/2010    Years since quitting: 11.2  . Smokeless tobacco: Current User    Types: Chew  . Tobacco comment: Dips, rec to see dentist!  Vaping Use  . Vaping Use: Never used  Substance and Sexual Activity  . Alcohol use: Never  . Drug use: No    Comment: denies   . Sexual activity: Yes    Birth control/protection: None  Other Topics Concern  . Not on file  Social History Narrative   Lives w/ wife, 3 children plus adopted 3 children   Social Determinants of Health   Financial Resource Strain: Not on file  Food Insecurity: Not on file  Transportation Needs: Not on file  Physical Activity: Not on file  Stress: Not on file  Social Connections: Not on file  Intimate Partner Violence: Not on file     Review of Systems: General: negative for chills, fever, night sweats or weight changes.  Cardiovascular: negative for chest pain, dyspnea on exertion, edema, orthopnea, palpitations, paroxysmal nocturnal dyspnea or shortness of breath Dermatological: negative for rash Respiratory: negative for cough or wheezing Urologic: negative for hematuria Abdominal:  negative for nausea, vomiting, diarrhea, bright red blood per rectum, melena, or hematemesis Neurologic: negative for visual changes, syncope, or dizziness All other systems reviewed and are otherwise negative except as noted above.    Blood pressure (!) 150/84, pulse (!) 51, height 6' (1.829 m), weight 241 lb 6.4 oz (109.5 kg), SpO2 96 %.  General appearance: alert and no distress Neck: no adenopathy, no carotid bruit, no JVD, supple, symmetrical, trachea midline and thyroid not enlarged, symmetric, no tenderness/mass/nodules Lungs: clear to auscultation bilaterally Heart: regular rate and rhythm, S1, S2 normal, no murmur, click, rub or  gallop Extremities: extremities normal, atraumatic, no cyanosis or edema Pulses: 2+ and symmetric Skin: Skin color, texture, turgor normal. No rashes or lesions Neurologic: Alert and oriented X 3, normal strength and tone. Normal symmetric reflexes. Normal coordination and gait  EKG sinus bradycardia 51 without ST or T wave changes.  I personally reviewed this EKG.  ASSESSMENT AND PLAN:   Essential hypertension Mr. Burdette was referred to me by Dr. Radford Pax for evaluation of question renal vascular hypertension.  He does have essential hypertension on 3 antihypertensive medications including amlodipine, carvedilol and valsartan on maximum doses.  His blood pressure today is 150/84 which is fairly consistent with his blood pressure measurements at home.  Renal Dopplers performed 02/18/2021 did not suggest that he had renal artery stenosis with renal aortic ratios in the mid 2 range bilaterally.  I suspect his hypertension is essential.  He may benefit from the addition of a low-dose diuretic such as chlorthalidone.      Lorretta Harp MD FACP,FACC,FAHA, [facility] 04/29/2021 9:58 AM

## 2021-05-07 ENCOUNTER — Other Ambulatory Visit: Payer: Self-pay

## 2021-05-07 DIAGNOSIS — I6523 Occlusion and stenosis of bilateral carotid arteries: Secondary | ICD-10-CM

## 2021-05-16 ENCOUNTER — Other Ambulatory Visit: Payer: Self-pay | Admitting: Internal Medicine

## 2021-06-03 ENCOUNTER — Other Ambulatory Visit: Payer: Self-pay

## 2021-06-03 ENCOUNTER — Encounter: Payer: Self-pay | Admitting: Internal Medicine

## 2021-06-03 ENCOUNTER — Ambulatory Visit (INDEPENDENT_AMBULATORY_CARE_PROVIDER_SITE_OTHER): Payer: 59 | Admitting: Internal Medicine

## 2021-06-03 VITALS — BP 136/80 | HR 71 | Temp 97.8°F | Resp 18 | Ht 72.0 in | Wt 233.1 lb

## 2021-06-03 DIAGNOSIS — Z0000 Encounter for general adult medical examination without abnormal findings: Secondary | ICD-10-CM | POA: Diagnosis not present

## 2021-06-03 DIAGNOSIS — Z87891 Personal history of nicotine dependence: Secondary | ICD-10-CM

## 2021-06-03 DIAGNOSIS — E7800 Pure hypercholesterolemia, unspecified: Secondary | ICD-10-CM | POA: Diagnosis not present

## 2021-06-03 DIAGNOSIS — Z0100 Encounter for examination of eyes and vision without abnormal findings: Secondary | ICD-10-CM

## 2021-06-03 DIAGNOSIS — E119 Type 2 diabetes mellitus without complications: Secondary | ICD-10-CM | POA: Diagnosis not present

## 2021-06-03 DIAGNOSIS — Z1159 Encounter for screening for other viral diseases: Secondary | ICD-10-CM

## 2021-06-03 DIAGNOSIS — Z0001 Encounter for general adult medical examination with abnormal findings: Secondary | ICD-10-CM

## 2021-06-03 DIAGNOSIS — Z122 Encounter for screening for malignant neoplasm of respiratory organs: Secondary | ICD-10-CM

## 2021-06-03 DIAGNOSIS — I10 Essential (primary) hypertension: Secondary | ICD-10-CM

## 2021-06-03 LAB — CBC WITH DIFFERENTIAL/PLATELET
Basophils Absolute: 0.1 10*3/uL (ref 0.0–0.1)
Basophils Relative: 0.6 % (ref 0.0–3.0)
Eosinophils Absolute: 0 10*3/uL (ref 0.0–0.7)
Eosinophils Relative: 0.4 % (ref 0.0–5.0)
HCT: 43.2 % (ref 39.0–52.0)
Hemoglobin: 14.7 g/dL (ref 13.0–17.0)
Lymphocytes Relative: 24.5 % (ref 12.0–46.0)
Lymphs Abs: 2.3 10*3/uL (ref 0.7–4.0)
MCHC: 34 g/dL (ref 30.0–36.0)
MCV: 83.7 fl (ref 78.0–100.0)
Monocytes Absolute: 0.7 10*3/uL (ref 0.1–1.0)
Monocytes Relative: 7.6 % (ref 3.0–12.0)
Neutro Abs: 6.2 10*3/uL (ref 1.4–7.7)
Neutrophils Relative %: 66.9 % (ref 43.0–77.0)
Platelets: 270 10*3/uL (ref 150.0–400.0)
RBC: 5.16 Mil/uL (ref 4.22–5.81)
RDW: 14.1 % (ref 11.5–15.5)
WBC: 9.3 10*3/uL (ref 4.0–10.5)

## 2021-06-03 LAB — COMPREHENSIVE METABOLIC PANEL
ALT: 14 U/L (ref 0–53)
AST: 12 U/L (ref 0–37)
Albumin: 4.4 g/dL (ref 3.5–5.2)
Alkaline Phosphatase: 101 U/L (ref 39–117)
BUN: 11 mg/dL (ref 6–23)
CO2: 22 mEq/L (ref 19–32)
Calcium: 9.6 mg/dL (ref 8.4–10.5)
Chloride: 105 mEq/L (ref 96–112)
Creatinine, Ser: 1.05 mg/dL (ref 0.40–1.50)
GFR: 79.97 mL/min (ref >60.00)
Glucose, Bld: 109 mg/dL — ABNORMAL HIGH (ref 70–99)
Potassium: 4.1 mEq/L (ref 3.5–5.1)
Sodium: 139 mEq/L (ref 135–145)
Total Bilirubin: 0.7 mg/dL (ref 0.2–1.2)
Total Protein: 7.4 g/dL (ref 6.0–8.3)

## 2021-06-03 LAB — LIPID PANEL
Cholesterol: 189 mg/dL (ref 0–200)
HDL: 41.9 mg/dL (ref >39.00)
LDL Cholesterol: 118 mg/dL — ABNORMAL HIGH (ref 0–99)
NonHDL: 147.08
Total CHOL/HDL Ratio: 5
Triglycerides: 143 mg/dL (ref 0.0–149.0)
VLDL: 28.6 mg/dL (ref 0.0–40.0)

## 2021-06-03 LAB — HEMOGLOBIN A1C: Hgb A1c MFr Bld: 6.2 % (ref 4.6–6.5)

## 2021-06-03 NOTE — Progress Notes (Signed)
Subjective:    Patient ID: Mike Shepard, male    DOB: 11/11/1966, 55 y.o.   MRN: [id number]  DOS:  06/03/2021 Type of visit - description: CPX  Here for CPX His main concern is excessive fatigue, this is going on for few months. Also decreased sex drive,denies erectile dysfunction. He is doing better with diet,  has lost some weight on his own scales. Specifically denies chest pain or difficulty breathing.   Wt Readings from Last 3 Encounters:  06/03/21 233 lb 2 oz (105.7 kg)  04/29/21 241 lb 6.4 oz (109.5 kg)  03/11/21 249 lb (112.9 kg)    Review of Systems  Other than above, a 14 point review of systems is negative       Past Medical History:  Diagnosis Date   Aortic atherosclerosis (HCC)    Carotid artery stenosis    s/p RCEA now with occluded RICA and 1-82% LICA stenosis by dopplers 10/2020   Chronic back pain    see surgeries    GERD (gastroesophageal reflux disease)    H/O: gout    Hyperlipidemia    Hypertension    PPD positive    Positive PPD, remotely ~2000, s/p Rx     Pre-diabetes    Primary osteoarthritis of right hip    end stage   Stroke [facility])    " mild"   Wears dentures     Past Surgical History:  Procedure Laterality Date   BACK SURGERY     2007-2013 (low back)   ENDARTERECTOMY Right 06/02/2019   Procedure: ENDARTERECTOMY CAROTID RIGHT;  Surgeon: Marty Heck, MD;  Location: [facility] OR;  Service: Vascular;  Laterality: Right;   INTRAOPERATIVE ARTERIOGRAM  06/02/2019   Procedure: Right carotid and cerebral angiogram;  Surgeon: Marty Heck, MD;  Location: [facility] OR;  Service: Vascular;;   MULTIPLE TOOTH EXTRACTIONS     NECK SURGERY     2010   OPERATIVE ULTRASOUND Right 06/02/2019   Procedure: Operative Ultrasound;  Surgeon: Marty Heck, MD;  Location: [facility] OR;  Service: Vascular;  Laterality: Right;   Social History   Socioeconomic History   Marital status: Married    Spouse name: Not on file   Number of children: 3    Years of education: Not on file   Highest education level: Not on file  Occupational History   Occupation: [employer] [place] ([employer])  Tobacco Use   Smoking status: Former    Pack years: 0.00    Types: Cigarettes    Quit date: 01/27/2010    Years since quitting: 11.3   Smokeless tobacco: Current    Types: Chew   Tobacco comments:    Quit tobacco, used to smoke 1 ppd ~ 24 years, quit ~ 2014    Dips, rec to see dentist!  Vaping Use   Vaping Use: Never used  Substance and Sexual Activity   Alcohol use: Never   Drug use: No    Comment: denies    Sexual activity: Yes    Birth control/protection: None  Other Topics Concern   Not on file  Social History Narrative   Lives w/ wife   Has  3 children plus adopted 3 children   Social Determinants of Health   Financial Resource Strain: Not on file  Food Insecurity: Not on file  Transportation Needs: Not on file  Physical Activity: Not on file  Stress: Not on file  Social Connections: Not on file  Intimate Partner Violence: Not on  file    Allergies as of 06/03/2021   No Known Allergies      Medication List        Accurate as of June 03, 2021 11:59 PM. If you have any questions, ask your nurse or doctor.          amLODipine 10 MG tablet Commonly known as: NORVASC Take 1 tablet (10 mg total) by mouth daily.   aspirin 81 MG EC tablet Take 1 tablet (81 mg total) by mouth daily.   atorvastatin 20 MG tablet Commonly known as: LIPITOR Take 1 tablet (20 mg total) by mouth at bedtime.   buprenorphine 2 MG Subl SL tablet Commonly known as: SUBUTEX Place 2 tablets under the tongue in the morning and at bedtime.   carvedilol 25 MG tablet Commonly known as: COREG Take 1 tablet (25 mg total) by mouth 2 (two) times daily with a meal.   clopidogrel 75 MG tablet Commonly known as: PLAVIX TAKE 1 TABLET BY MOUTH  DAILY   hydrALAZINE 50 MG tablet Commonly known as: APRESOLINE Take 75 mg by mouth 3 (three) times daily.    multivitamin with minerals Tabs tablet Take 1 tablet by mouth daily.   valsartan 320 MG tablet Commonly known as: DIOVAN Take 1 tablet (320 mg total) by mouth daily.           Objective:   Physical Exam BP 136/80 (BP Location: Left Arm, Patient Position: Sitting, Cuff Size: Normal)   Pulse 71   Temp 97.8 F (36.6 C) (Oral)   Resp 18   Ht 6' (1.829 m)   Wt 233 lb 2 oz (105.7 kg)   SpO2 98%   BMI 31.62 kg/m  General: Well developed, NAD, BMI noted Neck: No  thyromegaly  HEENT:  Normocephalic . Face symmetric, atraumatic Lungs:  CTA B Normal respiratory effort, no intercostal retractions, no accessory muscle use. Heart: RRR,  no murmur.  Abdomen:  Not distended, soft, non-tender. No rebound or rigidity.   Lower extremities: no pretibial edema bilaterally  Skin: Exposed areas without rash. Not pale. Not jaundice Neurologic:  alert & oriented X3.  Speech normal, gait appropriate for age and unassisted Strength symmetric and appropriate for age.  Psych: Cognition and judgment appear intact.  Cooperative with normal attention span and concentration.  Behavior appropriate. No anxious or depressed appearing.     Assessment    Assessment DM: A1c 6.5 (08/2019) HTN Hyperlipidemia CV: --MRI- brain  02-2018: Remote infarct, that lead to further w/u, dx w/ carotid dz -- R endarterectomy 05/2019 ---had w/u for secondary HTN 2022 -- neg stress test 02/2021 OSA Dx 02/2021 GERD Elevated TSH 2014 H/o gout  MSK:  -DJD hip --Chronic back pain + PPD 2000, s/p  antibiotics Increase LFTs: noted 04/2019, coincide w/ statin RX, AP slightly elevated.U/S 02/2020: Fatty liver. 05/2020: Hep B negative, iron normal. Covid DX 12/25/2019   PLAN Here for CPX DM: Diet controlled, check A1c.  Seems to be doing well with lifestyle. HTN Since last visit had eval for secondary HTN Renal Dopplers  02/18/2021 see results, saw Dr Gwenlyn Found, not suspicious for RAS PRA and aldo=normal 24HR VMA=8  (<7) Normetanephrine and Norepinephrine slt up , saw endo further eval Rx but not completed to my knowledge, will advise pt to proceed BP today 136/80, at home is typically in the 140s, occasionally 150. Believe that once he started treating OSA, BPs will improve. Continue amlodipine, carvedilol, hydralazine, Diovan. OSA: Dx 02-2021, still not treated, plans to  contact Dr. Radford Pax ASAP. Hyperlipidemia: On Lipitor, checking labs Fatigue, decreased sex drive: Reassess after OSA treatment RTC 4 months    In addition to CPX, I addressed DM, HTN, fatigue, hyperlipidemia.  Extensive chart review regards w/u for secondary hypertension  This visit occurred during the SARS-CoV-2 public health emergency.  Safety protocols were in place, including screening questions prior to the visit, additional usage of staff PPE, and extensive cleaning of exam room while observing appropriate contact time as indicated for disinfecting solutions.

## 2021-06-03 NOTE — Patient Instructions (Addendum)
We have placed a referral to [facility] in [place] for your diabetic eye exam, it is important that you start to do these at least once yearly to protect your vision. Please expect a call in the next several days to schedule an appointment.   Contact Dr. Theodosia Blender about his sleep apnea ASAP   Check the  blood pressure 2 or 3 times a week BP GOAL is between 110/65 and  135/85. If it is consistently higher or lower, let me know      GO TO THE LAB : Get the blood work     [place], [place] back for a checkup in 4 months

## 2021-06-04 ENCOUNTER — Encounter: Payer: Self-pay | Admitting: Internal Medicine

## 2021-06-04 NOTE — Assessment & Plan Note (Addendum)
Here for CPX DM: Diet controlled, check A1c.  Seems to be doing well with lifestyle. HTN Since last visit had eval for secondary HTN Renal Dopplers  02/18/2021 see results, saw Dr Gwenlyn Found, not suspicious for RAS PRA and aldo=normal 24HR VMA=8 (<7) Normetanephrine and Norepinephrine slt up , saw endo further eval Rx but not completed to my knowledge, will advise pt to proceed BP today 136/80, at home is typically in the 140s, occasionally 150. Believe that once he started treating OSA, BPs will improve. Continue amlodipine, carvedilol, hydralazine, Diovan. OSA: Dx 02-2021, still not treated, plans to contact Dr. Radford Pax ASAP. Hyperlipidemia: On Lipitor, checking labs Fatigue, decreased sex drive: Reassess after OSA treatment RTC 4 months

## 2021-06-04 NOTE — Assessment & Plan Note (Addendum)
-  Td 2016 - shingrix: Declined for now - covid vax x 3, rec # 4 -CCS: cscope 11-2020, next per GI -prostate ca screening: + FH prostate cancer, GF age 55; DRE - PSA wnl 01/2020. -Labs: CMP,CBC, A1c, FLP, hep C --Diet exercise discussed, he is actually trying to do better, + wt loss noted --Tobacco use: +dip, recommend to see dentist regularly.  Not ready to quit completely --Lung cancer screening: Smoking history 24 pack a year, quit 8 years ago.  Currently with no symptoms. Cardiac CT 02/18/2021 showed Small airway disease/alveolitis. Plan:  will arrange for arrange for a lung cancer screening CT.

## 2021-06-06 LAB — HEPATITIS C ANTIBODY
Hepatitis C Ab: NONREACTIVE
SIGNAL TO CUT-OFF: 0.1 (ref <1.00)

## 2021-06-08 MED ORDER — ATORVASTATIN CALCIUM 40 MG PO TABS: 40.0000 mg | ORAL_TABLET | Freq: Every day | ORAL | 3 refills | Status: DC

## 2021-06-08 NOTE — Addendum Note (Signed)
Addended byDamita Dunnings D on: 06/08/2021 07:59 AM   Modules accepted: Orders

## 2021-06-15 ENCOUNTER — Telehealth: Payer: Self-pay | Admitting: *Deleted

## 2021-06-15 DIAGNOSIS — Z87891 Personal history of nicotine dependence: Secondary | ICD-10-CM

## 2021-06-16 NOTE — Telephone Encounter (Signed)
Spoke with patient Scheduled SDMV 07/22/21 2:00 CT order was placed Pt voiced understanding and had no further questions.

## 2021-06-26 ENCOUNTER — Other Ambulatory Visit: Payer: Self-pay | Admitting: Cardiology

## 2021-07-22 ENCOUNTER — Encounter: Payer: 59 | Admitting: Primary Care

## 2021-07-22 ENCOUNTER — Inpatient Hospital Stay: Admission: RE | Admit: 2021-07-22 | Payer: 59 | Source: Ambulatory Visit

## 2021-07-22 ENCOUNTER — Telehealth: Payer: Self-pay | Admitting: Primary Care

## 2021-07-22 NOTE — Progress Notes (Signed)
 Err

## 2021-07-22 NOTE — Patient Instructions (Signed)
Thank you for participating in the [facility]. It was our pleasure to meet you today. We will call you with the results of your scan within the next few days. Your scan will be assigned a Lung RADS category score by the physicians reading the scans.  This Lung RADS score determines follow up scanning.  See below for description of categories, and follow up screening recommendations. We will be in touch to schedule your follow up screening annually or based on recommendations of our providers. We will fax a copy of your scan results to your Primary Care Physician, or the physician who referred you to the program, to ensure they have the results. Please call the office if you have any questions or concerns regarding your scanning experience or results.  Our office number is [phone]. Please speak with Denise Phelps, RN. She is our Lung Cancer Screening RN. If she is unavailable when you call, please have the office staff send her a message. She will return your call at her earliest convenience. Remember, if your scan is normal, we will scan you annually as long as you continue to meet the criteria for the program. (Age 55-77, Current smoker or smoker who has quit within the last 15 years). If you are a smoker, remember, quitting is the single most powerful action that you can take to decrease your risk of lung cancer and other pulmonary, breathing related problems. We know quitting is hard, and we are here to help.  Please let us know if there is anything we can do to help you meet your goal of quitting. If you are a former smoker, congratulations. We are proud of you! Remain smoke free! Remember you can refer friends or family members through the number above.  We will screen them to make sure they meet criteria for the program. Thank you for helping us take better care of you by participating in Lung Screening.  Lung RADS Categories:  Lung RADS 1: no nodules  or definitely non-concerning nodules.  Recommendation is for a repeat annual scan in 12 months.  Lung RADS 2:  nodules that are non-concerning in appearance and behavior with a very low likelihood of becoming an active cancer. Recommendation is for a repeat annual scan in 12 months.  Lung RADS 3: nodules that are probably non-concerning , includes nodules with a low likelihood of becoming an active cancer.  Recommendation is for a 6-month repeat screening scan. Often noted after an upper respiratory illness. We will be in touch to make sure you have no questions, and to schedule your 6-month scan.  Lung RADS 4 A: nodules with concerning findings, recommendation is most often for a follow up scan in 3 months or additional testing based on our provider's assessment of the scan. We will be in touch to make sure you have no questions and to schedule the recommended 3 month follow up scan.  Lung RADS 4 B:  indicates findings that are concerning. We will be in touch with you to schedule additional diagnostic testing based on our provider's  assessment of the scan.   

## 2021-08-01 NOTE — Telephone Encounter (Signed)
LMTC x 1  

## 2021-08-02 ENCOUNTER — Other Ambulatory Visit: Payer: Self-pay

## 2021-08-02 ENCOUNTER — Ambulatory Visit (HOSPITAL_COMMUNITY)
Admission: RE | Admit: 2021-08-02 | Discharge: 2021-08-02 | Disposition: A | Discharge status: Home / Self Care | Payer: 59 | Source: Ambulatory Visit | Attending: Vascular Surgery | Admitting: Vascular Surgery

## 2021-08-02 ENCOUNTER — Encounter: Payer: Self-pay | Admitting: Vascular Surgery

## 2021-08-02 ENCOUNTER — Ambulatory Visit (INDEPENDENT_AMBULATORY_CARE_PROVIDER_SITE_OTHER): Payer: 59 | Admitting: Vascular Surgery

## 2021-08-02 VITALS — BP 150/82 | HR 58 | Temp 97.2°F | Resp 18 | Ht 72.0 in | Wt 230.2 lb

## 2021-08-02 DIAGNOSIS — I6523 Occlusion and stenosis of bilateral carotid arteries: Secondary | ICD-10-CM | POA: Insufficient documentation

## 2021-08-02 NOTE — Progress Notes (Signed)
Patient name: Mike Shepard MRN: [id number] DOB: 26-Nov-1966 Sex: male  REASON FOR VISIT: 1 year follow-up after right carotid endarterectomy  HPI: Mike Shepard is a 55 y.o. male that presents for 1 year interval follow-up after right carotid endarterectomy on 06/02/2019 for an asymptomatic high-grade stenosis.  At the time of his carotid endarterectomy I had a thump signal in the ICA at completion as also noted on preoperative duplex.  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  His right ICA later had a silent occlusion on subsequent follow-up.  Today he reports no issues over the last 1 year.  He's had no neurologic events.  He is working.  He does take aspirin statin daily.      Past Medical History:  Diagnosis Date   Aortic atherosclerosis ([place])    Carotid artery stenosis    s/p RCEA now with occluded RICA and 123456 LICA stenosis by dopplers 10/2020   Chronic back pain    see surgeries    GERD (gastroesophageal reflux disease)    H/O: gout    Hyperlipidemia    Hypertension    PPD positive    Positive PPD, remotely ~2000, s/p Rx     Pre-diabetes    Primary osteoarthritis of right hip    end stage   Stroke [facility])    " mild"   Wears dentures     Past Surgical History:  Procedure Laterality Date   BACK SURGERY     2007-2013 (low back)   ENDARTERECTOMY Right 06/02/2019   Procedure: ENDARTERECTOMY CAROTID RIGHT;  Surgeon: Marty Heck, MD;  Location: [place];  Service: Vascular;  Laterality: Right;   INTRAOPERATIVE ARTERIOGRAM  06/02/2019   Procedure: Right carotid and cerebral angiogram;  Surgeon: Marty Heck, MD;  Location: [facility] OR;  Service: Vascular;;   MULTIPLE TOOTH EXTRACTIONS     NECK SURGERY     2010   OPERATIVE ULTRASOUND Right 06/02/2019   Procedure: Operative  Ultrasound;  Surgeon: Marty Heck, MD;  Location: [place];  Service: Vascular;  Laterality: Right;    Family History  Problem Relation Age of Onset   Hypertension Mother        M,F,Sis, bro   Diabetes Father 45       CAD, died of MI   Diabetes Sister    Hypertension Sister    Diabetes Brother    Hypertension Brother    Prostate cancer Paternal Grandfather        dx ~ 71 y/o   Colon cancer Paternal Uncle    Anesthesia problems Neg Hx    CAD Neg Hx    Stroke Neg Hx    Stomach cancer Neg Hx    Esophageal cancer Neg Hx    Pancreatic cancer Neg Hx    Adrenal disorder Neg Hx     SOCIAL HISTORY: Social History   Tobacco Use   Smoking status: Former    Types: Cigarettes    Quit date: 01/27/2010    Years since quitting: 11.5   Smokeless tobacco: Current    Types: Chew   Tobacco comments:    Quit tobacco, used to smoke 1 ppd ~ 24 years, quit ~ 2014    Dips, rec to see dentist!  Substance Use Topics   Alcohol use: Never    No Known Allergies  Current Outpatient Medications  Medication Sig Dispense Refill   amLODipine (NORVASC) 10 MG tablet Take 1 tablet (10 mg total) by mouth daily. 30 tablet 0   aspirin EC 81 MG EC tablet Take 1 tablet (81 mg total) by mouth daily.     atorvastatin (LIPITOR) 40 MG tablet Take 1 tablet (40 mg total) by mouth at bedtime. 30 tablet 3   buprenorphine (SUBUTEX) 2 MG SUBL SL tablet Place 2 tablets under the tongue in the morning and at bedtime.     carvedilol (COREG) 25 MG tablet Take 1 tablet (25 mg total) by mouth 2 (two) times daily with a meal. 180 tablet 1   clopidogrel (PLAVIX) 75 MG tablet TAKE 1 TABLET BY MOUTH  DAILY 90 tablet 3   hydrALAZINE (APRESOLINE) 50 MG tablet TAKE 1 & 1/2 (ONE & ONE-HALF) TABLETS BY MOUTH THREE TIMES DAILY 135 tablet 3   Multiple Vitamin (MULTIVITAMIN WITH MINERALS) TABS Take 1 tablet by mouth daily.     valsartan (DIOVAN) 320 MG tablet Take 1 tablet (320 mg total) by mouth daily. 90 tablet 3   No  current facility-administered medications for this visit.    REVIEW OF SYSTEMS:  '[X]'$  denotes positive finding, '[ ]'$  denotes negative finding Cardiac  Comments:  Chest pain or chest pressure:    Shortness of breath upon exertion:    Short of breath when lying flat:    Irregular heart rhythm:        Vascular    Pain in calf, thigh, or hip brought on by ambulation:    Pain in feet at night that wakes you up from your sleep:     Blood clot in your veins:    Leg swelling:         Pulmonary    Oxygen at home:    Productive cough:     Wheezing:         Neurologic    Sudden weakness in arms or legs:     Sudden numbness in arms or legs:     Sudden onset of difficulty speaking or slurred speech:    Temporary loss of vision in one eye:     Problems with dizziness:         Gastrointestinal    Blood in stool:     Vomited blood:         Genitourinary    Burning when urinating:     Blood in urine:        Psychiatric    Major depression:         Hematologic    Bleeding problems:    Problems with blood clotting too easily:        Skin    Rashes or ulcers:        Constitutional    Fever or chills:      PHYSICAL EXAM: Vitals:   08/02/21 1157 08/02/21 1159  BP: (!) 153/87 (!) 150/82  Pulse: (!) 58   Resp: 18   Temp: (!) 97.2 F (36.2 C)   TempSrc: Temporal   SpO2: 98%   Weight: 230 lb 3.2 oz (104.4 kg)   Height: 6' (1.829 m)     Mike: The patient is a well-nourished male, in no acute distress. The vital signs are documented above. CARDIAC: There is a regular rate and rhythm.  VASCULAR:  Right neck incision well healed CN II-XII grossly intact No other appreciable neurologic deficits   DATA:   Carotid duplex today shows  right ICA is occluded as previously demonstrated and left ICA has minimal 1 to 39% stenosis.  Assessment/Plan:  55 year old male that presents for ongoing surveillance after right carotid endarterectomy on 06/02/2019 for an asymptomatic  high-grade stenosis.  As noted above his right ICA occluded last year on subsequent follow-up likely due to tandem intracranial disease with limited outflow.  As previously noted at the time of his carotid endarterectomy we had a thumping signal in the ICA that was noted on preoperative duplex and an intraoperative angiogram was obtained that showed a patent carotid endarterectomy site and a patent ICA up to the skull base but he had multiple tandem high-grade intracranial lesions distally.    This was a silent occlusion.  He remains neurologically intact.  Again discussed no indication for revascularization with occluded ICA at this time.  We are following his contralateral disease.  This remains minimal at 1 to 39%.  Discussed continue aspirin statin and I will see him again in 1 year with carotid duplex.  Marty Heck, MD [facility] Office: [phone]

## 2021-08-04 NOTE — Telephone Encounter (Signed)
Spoke with pt and rescheduled SDMV for 08/26/21 9:00. CT will be rescheduled. Pt verbalized understanding. Nothing further needed.

## 2021-08-05 ENCOUNTER — Other Ambulatory Visit: Payer: Self-pay | Admitting: Internal Medicine

## 2021-08-26 ENCOUNTER — Encounter: Payer: Self-pay | Admitting: Primary Care

## 2021-08-26 ENCOUNTER — Ambulatory Visit (INDEPENDENT_AMBULATORY_CARE_PROVIDER_SITE_OTHER)
Admission: RE | Admit: 2021-08-26 | Discharge: 2021-08-26 | Disposition: A | Discharge status: Home / Self Care | Payer: 59 | Source: Ambulatory Visit | Attending: Cardiovascular Disease | Admitting: Cardiovascular Disease

## 2021-08-26 ENCOUNTER — Telehealth: Payer: Self-pay | Admitting: *Deleted

## 2021-08-26 ENCOUNTER — Other Ambulatory Visit: Payer: Self-pay

## 2021-08-26 ENCOUNTER — Telehealth (INDEPENDENT_AMBULATORY_CARE_PROVIDER_SITE_OTHER): Payer: 59 | Admitting: Primary Care

## 2021-08-26 DIAGNOSIS — Z87891 Personal history of nicotine dependence: Secondary | ICD-10-CM

## 2021-08-26 NOTE — Progress Notes (Signed)
Virtual Visit via Telephone Note  I connected with Mike Shepard on 08/26/21 at  9:00 AM EDT by telephone and verified that I am speaking with the correct person using two identifiers.  Location: Patient: Home Provider: Office    I discussed the limitations, risks, security and privacy concerns of performing an evaluation and management service by telephone and the availability of in person appointments. I also discussed with the patient that there may be a patient responsible charge related to this service. The patient expressed understanding and agreed to proceed.    Shared Decision Making Visit Lung Cancer Screening Program [phone])   Eligibility: Age 55 y.o. Pack Years Smoking History Calculation 24 (# packs/per year x # years smoked) Recent History of coughing up blood  no Unexplained weight loss? no ( >Than 15 pounds within the last 6 months ) Prior History Lung / other cancer no (Diagnosis within the last 5 years already requiring surveillance chest CT Scans). Smoking Status Former Smoker Former Smokers: Years since quit: 10 years  Quit Date: 2012  Visit Components: Discussion included one or more decision making aids. yes Discussion included risk/benefits of screening. yes Discussion included potential follow up diagnostic testing for abnormal scans. yes Discussion included meaning and risk of over diagnosis. yes Discussion included meaning and risk of False Positives. yes Discussion included meaning of total radiation exposure. yes  Counseling Included: Importance of adherence to annual lung cancer LDCT screening. yes Impact of comorbidities on ability to participate in the program. yes Ability and willingness to under diagnostic treatment. yes  Smoking Cessation Counseling: Current Smokers:  Discussed importance of smoking cessation. yes Information about tobacco cessation classes and interventions provided to patient. yes Patient provided with "ticket"  for LDCT Scan. yes Symptomatic Patient. no  Counseling(Intermediate counseling: > three minutes) 99406 Diagnosis Code: Tobacco Use Z72.0 Asymptomatic Patient yes  Counseling (Intermediate counseling: > three minutes counseling) [id number] Former Smokers:  Discussed the importance of maintaining cigarette abstinence. yes Diagnosis Code: Personal History of Nicotine Dependence. [id number] Information about tobacco cessation classes and interventions provided to patient. Yes Patient provided with "ticket" for LDCT Scan. NA, virtual visit  Written Order for Lung Cancer Screening with LDCT placed in Epic. Yes (CT Chest Lung Cancer Screening Low Dose W/O CM) [id number] Z12.2-Screening of respiratory organs Z87.891-Personal history of nicotine dependence  I have spent 25 minutes of face to face time with Mr Brazile discussing the risks and benefits of lung cancer screening. We viewed a power point together that explained in detail the above noted topics. We paused at intervals to allow for questions to be asked and answered to ensure understanding.We discussed that the single most powerful action that he can take to decrease his risk of developing lung cancer is to quit smoking. We discussed whether or not he is ready to commit to setting a quit date. We discussed options for tools to aid in quitting smoking including nicotine replacement therapy, non-nicotine medications, support groups, Quit Smart classes, and behavior modification. We discussed that often times setting smaller, more achievable goals, such as eliminating 1 cigarette a day for a week and then 2 cigarettes a day for a week can be helpful in slowly decreasing the number of cigarettes smoked. This allows for a sense of accomplishment as well as providing a clinical benefit. I gave him the " Be Stronger Than Your Excuses" card with contact information for community resources, classes, free nicotine replacement therapy, and access to mobile apps, text  messaging, and  on-line smoking cessation help. I have also given him my card and contact information in the event hee needs to contact me. We discussed the time and location of the scan, and that either Doroteo Glassman RN or I will call with the results within 24-48 hours of receiving them. I have offered him  a copy of the power point we viewed  as a resource in the event they need reinforcement of the concepts we discussed today in the office. The patient verbalized understanding of all of  the above and had no further questions upon leaving the office. They have my contact information in the event they have any further questions.  I spent 3 minutes counseling on smoking cessation and the health risks of continued tobacco abuse.  I explained to the patient that there has been a high incidence of coronary artery disease noted on these exams. I explained that this is a non-gated exam therefore degree or severity cannot be determined. This patient is on statin therapy. I have asked the patient to follow-up with their PCP regarding any incidental finding of coronary artery disease and management with diet or medication as their PCP  feels is clinically indicated. The patient verbalized understanding of the above and had no further questions upon completion of the visit.     Martyn Ehrich, NP

## 2021-08-26 NOTE — Telephone Encounter (Signed)
Left message for the patient to call back and speak to the on-call preop APP of the day.  Patient had a normal Myoview in March 2022.  He does not appears to have a history of CAD.  He is not on aspirin and Plavix for cardiac reasons.  Patient has a history of carotid artery disease and CVA.  Plavix was prescribed by Dr. Larina Earthly.  Will defer to Dr. Larina Earthly to decide whether the patient can hold Plavix prior to hip surgery.

## 2021-08-26 NOTE — Patient Instructions (Signed)
Thank you for participating in the [place] Lung Cancer Screening Program. It was our pleasure to meet you today. We will call you with the results of your scan within the next few days. Your scan will be assigned a Lung RADS category score by the physicians reading the scans.  This Lung RADS score determines follow up scanning.  See below for description of categories, and follow up screening recommendations. We will be in touch to schedule your follow up screening annually or based on recommendations of our providers. We will fax a copy of your scan results to your Primary Care Physician, or the physician who referred you to the program, to ensure they have the results. Please call the office if you have any questions or concerns regarding your scanning experience or results.  Our office number is [phone]. Please speak with Denise Phelps, RN. She is our Lung Cancer Screening RN. If she is unavailable when you call, please have the office staff send her a message. She will return your call at her earliest convenience. Remember, if your scan is normal, we will scan you annually as long as you continue to meet the criteria for the program. (Age 55-77, Current smoker or smoker who has quit within the last 15 years). If you are a smoker, remember, quitting is the single most powerful action that you can take to decrease your risk of lung cancer and other pulmonary, breathing related problems. We know quitting is hard, and we are here to help.  Please let us know if there is anything we can do to help you meet your goal of quitting. If you are a former smoker, congratulations. We are proud of you! Remain smoke free! Remember you can refer friends or family members through the number above.  We will screen them to make sure they meet criteria for the program. Thank you for helping us take better care of you by participating in Lung Screening.  Lung RADS Categories:  Lung RADS 1: no nodules  or definitely non-concerning nodules.  Recommendation is for a repeat annual scan in 12 months.  Lung RADS 2:  nodules that are non-concerning in appearance and behavior with a very low likelihood of becoming an active cancer. Recommendation is for a repeat annual scan in 12 months.  Lung RADS 3: nodules that are probably non-concerning , includes nodules with a low likelihood of becoming an active cancer.  Recommendation is for a 6-month repeat screening scan. Often noted after an upper respiratory illness. We will be in touch to make sure you have no questions, and to schedule your 6-month scan.  Lung RADS 4 A: nodules with concerning findings, recommendation is most often for a follow up scan in 3 months or additional testing based on our provider's assessment of the scan. We will be in touch to make sure you have no questions and to schedule the recommended 3 month follow up scan.  Lung RADS 4 B:  indicates findings that are concerning. We will be in touch with you to schedule additional diagnostic testing based on our provider's  assessment of the scan.   

## 2021-08-26 NOTE — Telephone Encounter (Signed)
   [facility] Pre-operative Risk Assessment    Patient Name: YARON GRASSE  DOB: 1966-11-18 MRN: [id number]  HEARTCARE STAFF:  - IMPORTANT!!!!!! Under Visit Info/Reason for Call, type in Other and utilize the format Clearance MM/DD/YY or Clearance TBD. Do not use dashes or single digits. - Please review there is not already an duplicate clearance open for this procedure. - If request is for dental extraction, please clarify the # of teeth to be extracted. - If the patient is currently at the dentist's office, call Pre-Op Callback Staff (MA/nurse) to input urgent request.  - If the patient is not currently in the dentist office, please route to the Pre-Op pool.  Request for surgical clearance:  What type of surgery is being performed?  RIGHT TOTAL HIP ARTHROPLASTY  When is this surgery scheduled?  TBD  What type of clearance is required (medical clearance vs. Pharmacy clearance to hold med vs. Both)?  BOTH  Are there any medications that need to be held prior to surgery and how long?  ASPIRIN & PLAVIX  Practice name and name of physician performing surgery?  [facility] / DR. SWINTECK  What is the office phone number?  [phone]   7.   What is the office fax number?  [phone] ATTN:  KERRI  8.   Anesthesia type (None, local, MAC, general) ?  SPINAL   Jeanann Lewandowsky 08/26/2021, 3:24 PM  _________________________________________________________________   (provider comments below)

## 2021-08-29 ENCOUNTER — Telehealth: Payer: Self-pay

## 2021-08-29 NOTE — Telephone Encounter (Signed)
I am consulting with Dr. Carlis Abbott, vascular surgery if it is okay to stop aspirin and Plavix.  patient has carotid disease.

## 2021-08-29 NOTE — Telephone Encounter (Signed)
Received fax confirmation

## 2021-08-29 NOTE — Telephone Encounter (Signed)
Dr. Larose Kells called to request Plavix and Aspirin hold for pt's upcoming hip surgery (date TBD). I have messaged MD about this request and relayed MD's message to Dr. Larose Kells: Mike Shepard, Gwenyth Allegra, MD  Marguerita Merles, RN Yes that is fine.  We normally ask to continue aspirin but if he wants to stop both for hip surgery that is ok.  Dr. Larose Kells verbalized understanding; no further questions/concerns at this time.

## 2021-08-29 NOTE — Telephone Encounter (Signed)
Per Dr. Ainsley Spinner office: Faythe Ghee to stop Plavix Also okay to stop aspirin if needed Form completed

## 2021-08-29 NOTE — Telephone Encounter (Signed)
Form faxed w/ this telephone note, OV note/labs from 06/03/21 and EKG from 05/02/21 to ATTN: Bishop Limbo at [phone].

## 2021-08-29 NOTE — Telephone Encounter (Signed)
See phone note from 08/26/2021

## 2021-08-29 NOTE — Telephone Encounter (Signed)
Received surgical clearance from [facility], Pt needing R total hip arthroplasty with Dr. Lyla Glassing- date TBD, anesthesia: spinal. Pt had CPX on 06/03/2021, last EKG 04/29/21. Please advise if appt is needed.

## 2021-09-02 ENCOUNTER — Encounter: Payer: Self-pay | Admitting: *Deleted

## 2021-09-02 DIAGNOSIS — Z87891 Personal history of nicotine dependence: Secondary | ICD-10-CM

## 2021-09-06 NOTE — Telephone Encounter (Signed)
   Name: Mike Shepard  DOB: 1966/10/16  MRN: [id number]   Primary Cardiologist: Fransico Him, MD  Chart reviewed as part of pre-operative protocol coverage. Patient was contacted 09/06/2021 in reference to pre-operative risk assessment for pending surgery as outlined below.  Mike Shepard was last seen on 04/2021 by Dr. Gwenlyn Found. I reached out to patient for update on how he is doing. The patient affirms he has been doing well without any new cardiac symptoms. He rides a bike and walks for work, exerting over 4 METS, without any angina or dyspnea. Reports home BPs running 130/85. Stress test 02/2021 was normal. Therefore, based on ACC/AHA guidelines, the patient would be at acceptable risk for the planned procedure without further cardiovascular testing. The patient was advised that if he develops new symptoms prior to surgery to contact our office to arrange for a follow-up visit, and he verbalized understanding.  As below, patient is not on ASA/Plavix for cardiac reasons therefore we are deferring to PCP/vascular surgery for input on holding.  I will route this recommendation to the requesting party via Epic fax function and remove from pre-op pool. Please call with questions.  Charlie Pitter, PA-C 09/06/2021, 3:07 PM

## 2021-09-12 ENCOUNTER — Ambulatory Visit: Payer: Self-pay | Admitting: Student

## 2021-09-27 ENCOUNTER — Encounter: Payer: Self-pay | Admitting: Cardiology

## 2021-09-27 ENCOUNTER — Ambulatory Visit: Payer: 59 | Admitting: Cardiology

## 2021-09-27 ENCOUNTER — Other Ambulatory Visit: Payer: Self-pay

## 2021-09-27 VITALS — BP 140/64 | HR 71 | Ht 72.0 in | Wt 243.4 lb

## 2021-09-27 DIAGNOSIS — E7800 Pure hypercholesterolemia, unspecified: Secondary | ICD-10-CM | POA: Diagnosis not present

## 2021-09-27 DIAGNOSIS — I10 Essential (primary) hypertension: Secondary | ICD-10-CM

## 2021-09-27 DIAGNOSIS — I6523 Occlusion and stenosis of bilateral carotid arteries: Secondary | ICD-10-CM | POA: Diagnosis not present

## 2021-09-27 DIAGNOSIS — R079 Chest pain, unspecified: Secondary | ICD-10-CM

## 2021-09-27 DIAGNOSIS — R002 Palpitations: Secondary | ICD-10-CM

## 2021-09-27 DIAGNOSIS — G4733 Obstructive sleep apnea (adult) (pediatric): Secondary | ICD-10-CM

## 2021-09-27 LAB — LIPID PANEL
Chol/HDL Ratio: 3.4 ratio (ref 0.0–5.0)
Cholesterol, Total: 156 mg/dL (ref 100–199)
HDL: 46 mg/dL (ref >39)
LDL Chol Calc (NIH): 83 mg/dL (ref 0–99)
Triglycerides: 154 mg/dL — ABNORMAL HIGH (ref 0–149)
VLDL Cholesterol Cal: 27 mg/dL (ref 5–40)

## 2021-09-27 LAB — ALT: ALT: 18 IU/L (ref 0–44)

## 2021-09-27 MED ORDER — HYDRALAZINE HCL 100 MG PO TABS: 100.0000 mg | ORAL_TABLET | Freq: Three times a day (TID) | ORAL | 3 refills | Status: DC

## 2021-09-27 MED ORDER — AMLODIPINE BESYLATE 10 MG PO TABS: 10.0000 mg | ORAL_TABLET | Freq: Every day | ORAL | 3 refills | Status: DC

## 2021-09-27 MED ORDER — ATORVASTATIN CALCIUM 40 MG PO TABS: 40.0000 mg | ORAL_TABLET | Freq: Every day | ORAL | 3 refills | Status: DC

## 2021-09-27 MED ORDER — CARVEDILOL 25 MG PO TABS: 25.0000 mg | ORAL_TABLET | Freq: Two times a day (BID) | ORAL | 3 refills | Status: DC

## 2021-09-27 MED ORDER — VALSARTAN 320 MG PO TABS: 320.0000 mg | ORAL_TABLET | Freq: Every day | ORAL | 3 refills | Status: DC

## 2021-09-27 NOTE — Patient Instructions (Signed)
Medication Instructions:  Your physician has recommended you make the following change in your medication:  1) INCREASE hydralazine to 100 mg three times per day  *If you need a refill on your cardiac medications before your next appointment, please call your pharmacy*   Lab Work: TODAY: Fasting lipids and ALT If you have labs (blood work) drawn today and your tests are completely normal, you will receive your results only by: [place] (if you have MyChart) OR A paper copy in the mail If you have any lab test that is abnormal or we need to change your treatment, we will call you to review the results.  Follow-Up: At [facility], you and your health needs are our priority.  As part of our continuing mission to provide you with exceptional heart care, we have created designated Provider Care Teams.  These Care Teams include your primary Cardiologist (physician) and Advanced Practice Providers (APPs -  Physician Assistants and Nurse Practitioners) who all work together to provide you with the care you need, when you need it.  Your next appointment:   6 month(s)  The format for your next appointment:   In Person  Provider:   You may see Fransico Him, MD or one of the following Advanced Practice Providers on your designated Care Team:   Melina Copa, PA-C Ermalinda Barrios, PA-C   Other Instructions Follow up with PharmD in the hypertension clinic in one week.

## 2021-09-27 NOTE — Progress Notes (Signed)
Cardiology Consult Note    Date:  09/27/2021   ID:  MOSI HANNOLD, DOB 06-26-1966, MRN [id number]  PCP:  Colon Branch, MD  Cardiologist:  Quay Burow, MD  Chief Complaint  Patient presents with   Follow-up    HTN, carotid stenosis, chest pain, palpitations, HLD     History of Present Illness:  Mike Shepard is a 55 y.o. male  with a hx of right carotid artery stenosis (s/p RCEA 8 months ago and now with occluded RICA and 6-76% LICA by dopplers 12/9507), GERD, HLD, HTN and CVA. He  recently underwent home sleep study for HTN and excessive daytime sleepiness and showed mild OSA with an AHI of 6.6/hr and O2 sats as low as 88%.  Auto CPAP was ordered but he has not received his device yet.  When I last saw him he was having CP and a stress myoview showed no ischemia.  He also was having palpitations and heart monitor showed rare PVCs and PACs.  He was referred to [facility] for abnormal renal dopplers but he felt he had essential HTN and not renovascular HTN.  TSH and Aldo/Renin levels were normal.  Urine catecholamines were abnormal and he was referred to Endocrine for further evaluation.  He missed an appt and needs to get back in with them.    He is here today for followup and is doing well.  He denies any chest pain or pressure, SOB, DOE, PND, orthopnea, LE edema, dizziness or syncope. He occasionally will feel a skipped heart beat at times. He is compliant with his meds and is tolerating meds with no SE.     Past Medical History:  Diagnosis Date   Aortic atherosclerosis ([place])    Carotid artery stenosis    s/p RCEA now with occluded RICA and 3-26% LICA stenosis by dopplers 10/2020   Chronic back pain    see surgeries    GERD (gastroesophageal reflux disease)    H/O: gout    Hyperlipidemia    Hypertension    PPD positive    Positive PPD, remotely ~2000, s/p Rx     Pre-diabetes    Primary osteoarthritis of right hip    end stage   Stroke [facility])    " mild"   Wears  dentures     Past Surgical History:  Procedure Laterality Date   BACK SURGERY     2007-2013 (low back)   ENDARTERECTOMY Right 06/02/2019   Procedure: ENDARTERECTOMY CAROTID RIGHT;  Surgeon: Marty Heck, MD;  Location: [place];  Service: Vascular;  Laterality: Right;   INTRAOPERATIVE ARTERIOGRAM  06/02/2019   Procedure: Right carotid and cerebral angiogram;  Surgeon: Marty Heck, MD;  Location: [place];  Service: Vascular;;   MULTIPLE TOOTH EXTRACTIONS     NECK SURGERY     2010   OPERATIVE ULTRASOUND Right 06/02/2019   Procedure: Operative Ultrasound;  Surgeon: Marty Heck, MD;  [facility];  Service: Vascular;  Laterality: Right;    Current Medications: Current Meds  Medication Sig   amLODipine (NORVASC) 10 MG tablet Take 1 tablet (10 mg total) by mouth daily.   aspirin EC 81 MG EC tablet Take 1 tablet (81 mg total) by mouth daily.   atorvastatin (LIPITOR) 40 MG tablet Take 1 tablet (40 mg total) by mouth at bedtime.   buprenorphine (SUBUTEX) 2 MG SUBL SL tablet Place 2 tablets under the tongue in the morning and at bedtime.   carvedilol (COREG) 25  MG tablet Take 1 tablet (25 mg total) by mouth 2 (two) times daily with a meal.   clopidogrel (PLAVIX) 75 MG tablet TAKE 1 TABLET BY MOUTH  DAILY   hydrALAZINE (APRESOLINE) 50 MG tablet TAKE 1 & 1/2 (ONE & ONE-HALF) TABLETS BY MOUTH THREE TIMES DAILY   Multiple Vitamin (MULTIVITAMIN WITH MINERALS) TABS Take 1 tablet by mouth daily.   valsartan (DIOVAN) 320 MG tablet Take 1 tablet (320 mg total) by mouth daily.    Allergies:   Patient has no known allergies.   Social History   Socioeconomic History   Marital status: Married    Spouse name: Not on file   Number of children: 3   Years of education: Not on file   Highest education level: Not on file  Occupational History   Occupation: [employer]  Tobacco Use   Smoking status: Former    Types: Cigarettes    Quit date: 01/27/2010    Years since  quitting: 11.6   Smokeless tobacco: Current    Types: Chew   Tobacco comments:    Quit tobacco, used to smoke 1 ppd ~ 24 years, quit ~ 2014    Dips, rec to see dentist!  Vaping Use   Vaping Use: Never used  Substance and Sexual Activity   Alcohol use: Never   Drug use: No    Comment: denies    Sexual activity: Yes    Birth control/protection: None  Other Topics Concern   Not on file  Social History Narrative   Lives w/ wife   Has  3 children plus adopted 3 children   Social Determinants of Health   Financial Resource Strain: Not on file  Food Insecurity: Not on file  Transportation Needs: Not on file  Physical Activity: Not on file  Stress: Not on file  Social Connections: Not on file     Family History:  The patient's family history includes Colon cancer in his paternal uncle; Diabetes in his brother and sister; Diabetes (age of onset: 49) in his father; Hypertension in his brother, mother, and sister; Prostate cancer in his paternal grandfather.   ROS:   Please see the history of present illness.    ROS All other systems reviewed and are negative.  No flowsheet data found.   PHYSICAL EXAM:   VS:  BP 140/64   Pulse 71   Ht 6' (1.829 m)   Wt 243 lb 6.4 oz (110.4 kg)   SpO2 94%   BMI 33.01 kg/m    GEN: Well nourished, well developed in no acute distress HEENT: Normal NECK: No JVD; No carotid bruits LYMPHATICS: No lymphadenopathy CARDIAC:RRR, no murmurs, rubs, gallops RESPIRATORY:  Clear to auscultation without rales, wheezing or rhonchi  ABDOMEN: Soft, non-tender, non-distended MUSCULOSKELETAL:  No edema; No deformity  SKIN: Warm and dry NEUROLOGIC:  Alert and oriented x 3 PSYCHIATRIC:  Normal affect   Wt Readings from Last 3 Encounters:  09/27/21 243 lb 6.4 oz (110.4 kg)  08/02/21 230 lb 3.2 oz (104.4 kg)  06/03/21 233 lb 2 oz (105.7 kg)      Studies/Labs Reviewed:   EKG:  EKG is not ordered today.    Recent Labs: 12/27/2020: TSH 2.080 06/03/2021:  ALT 14; BUN 11; Creatinine, Ser 1.05; Hemoglobin 14.7; Platelets 270.0; Potassium 4.1; Sodium 139   Lipid Panel    Component Value Date/Time   CHOL 189 06/03/2021 1051   TRIG 143.0 06/03/2021 1051   HDL 41.90 06/03/2021 1051  CHOLHDL 5 06/03/2021 1051   VLDL 28.6 06/03/2021 1051   LDLCALC 118 (H) 06/03/2021 1051   LDLDIRECT 87.0 06/26/2019 0943      Additional studies/ records that were reviewed today include:  Home sleep study and PAP compliance download    ASSESSMENT:    1. OSA (obstructive sleep apnea)   2. Essential hypertension   3. Bilateral carotid artery stenosis   4. Pure hypercholesterolemia   5. Chest pain, unspecified type   6. Palpitations      PLAN:  In order of problems listed above:  OSA - -he was dx with mild OSA and is waiting for a CPAP device   HTN -BP goal < 130/30mmHg so mildly elevated this am -continue prescription drug management with Amlodipine 10mg  daily, Carvedilol 25mg  BID, and Valsartan 320mg  daily -increase Hyralazine to 100mg  TID -Pharm D followup in HTN clinic in 1 week -renal dopplers showed ? mild to moderate renal artery stenosis 02/2021 and evaluated by vascular who recommended medical management of HTN.  Renal aortic ratio seven 2.4 on the right and 2.15 on the left suggesting that he did not have renal artery stenosis. -Aldo and renin normal -TSH normal -urine catechols were abnormal>>followed by Endocrin>>I will get him back in to see them  Carotid artery stenosis -dopplers 07/2020 showed occluded RICA and 6-59% LICA -followed by PCP -continue ASA 81mg  daily, Plavix 75mg  daily and statin  HLD -LDL goal < 70 due to carotid artery stenosis -I have personally reviewed and interpreted outside Labs performed by patient's PCP which showed LDL 118, HDL 41 and ALT 14 in June 2022 -repeat FLP and ALT  -continue Lipitor 20mg  daily  Chest pain -this was  atypical but he has hx of vascular disease as well as remote hx of tobacco  abuse, fm hx of premature CAD, HLD and HTN -EKG nonischemic in Sept 2021 -lexican myoview showed no ischemia  Palpitations -? Related to caffeine -2 week zio 12/2020 showed rare PVCs and PACs -controlled on BB  Followup with me in 1 year  Medication Adjustments/Labs and Tests Ordered: Current medicines are reviewed at length with the patient today.  Concerns regarding medicines are outlined above.  Medication changes, Labs and Tests ordered today are listed in the Patient Instructions below.  There are no Patient Instructions on file for this visit.   Signed, Fransico Him, MD  09/27/2021 10:53 AM    [facility] [address], [place], [place]  [address] Phone: [phone]; Fax: [phone]

## 2021-09-27 NOTE — Addendum Note (Signed)
Addended by: Antonieta Iba on: 09/27/2021 11:06 AM   Modules accepted: Orders

## 2021-09-28 ENCOUNTER — Telehealth: Payer: Self-pay

## 2021-09-28 DIAGNOSIS — E7800 Pure hypercholesterolemia, unspecified: Secondary | ICD-10-CM

## 2021-09-28 MED ORDER — ATORVASTATIN CALCIUM 80 MG PO TABS: 80.0000 mg | ORAL_TABLET | Freq: Every day | ORAL | 3 refills | Status: DC

## 2021-09-28 NOTE — Telephone Encounter (Signed)
Spoke with the patient and advised him to increase atorvastatin to 80 mg daily. Repeat lab work has been scheduled.

## 2021-09-28 NOTE — Telephone Encounter (Signed)
-----   Message from [name], MD sent at 09/28/2021 11:47 AM EDT ----- Increase to Atorvastatin 80mg  daily and repeat FLp and ALT in 6 weeks ----- Message ----- From: Antonieta Iba, RN Sent: 09/28/2021   8:49 AM EDT To: [name], MD  The patient has been notified of the result and verbalized understanding.  All questions (if any) were answered. Antonieta Iba, RN 09/28/2021 8:48 AM   Patient is already taking atorvastatin 40 mg daily

## 2021-10-07 ENCOUNTER — Encounter: Payer: Self-pay | Admitting: Internal Medicine

## 2021-10-07 ENCOUNTER — Ambulatory Visit: Payer: 59 | Admitting: Internal Medicine

## 2021-10-13 NOTE — Progress Notes (Unsigned)
Patient ID: Mike Shepard                 DOB: [birth year]                      MRN: [id number]     HPI: MEDFORD STAHELI is a 55 y.o. male referred by Dr. Radford Pax to HTN clinic. PMH is significant for  right carotid artery stenosis (s/p RCEA 8 months ago and now with occluded RICA and 0-10% LICA by dopplers 01/7252), stress myoview showed no ischemia GERD, HLD, HTN and CVA, fm hx of premature CAD, palpitations. He denied referral for RAS after abnormal renal dopplers on recently underwent home sleep study for HTN and excessive daytime sleepiness and showed mild OSA with an AHI of 6.6/hr and O2 sats as low as 88%.  Auto CPAP was ordered but he has not received his device yet. Urine catecholamines were abnormal and he was referred to Endocrine for further evaluation.  He missed an appt and needs to get back in with them.  Renal dopplers on 02/2021 showed mild to moderate RAS but vascular recommended medical management of HTN.  Renal aortic ratio seven 2.4 on the right and 2.15 on the left suggesting that he did not have renal artery stenosis.  Patient was last seen by Dr. Radford Pax on 10/11. At that visit, hydralazine was increased to 100 mg TID  He is having total hip arthroplasty scheduled on 11/30 --------------------------------------- Thoughts:  - dip cessation? - has he gotten his CPAP machine - improved daytime sleepiness - assess home BP - did he increase hydralazine 100 mg  - negative RAS Interventions: - retry hctz pending labs today -  - spironolactone - also pending labs for resistant HTN ----------------------------   Current HTN meds: amlodipine 10 mg daily, carvedilol 25 mg BID, valsartan 320 mg daily, hydralazine 100 mg TID,  Previously tried: chlorthalidone (increased scr), HCTZ (switched to chlorthalidone), atenolol 100 mg daily, clonidine 0.1 mg BID, lisinopril 40 mg, atenolol-chlorthalidone BP goal: <130/80  Family History: family history includes Colon cancer in his  paternal uncle; Diabetes in his brother and sister; Diabetes (age of onset: 69) in his father; Hypertension in his brother, mother, and sister; Prostate cancer in his paternal grandfather.  Social History:  reports that he quit smoking about 11 years ago. His smoking use included cigarettes. His smokeless tobacco use includes chew. He reports that he does not drink alcohol and does not use drugs.  Diet:   Exercise:   Home BP readings:   Wt Readings from Last 3 Encounters:  09/27/21 243 lb 6.4 oz (110.4 kg)  08/02/21 230 lb 3.2 oz (104.4 kg)  06/03/21 233 lb 2 oz (105.7 kg)   BP Readings from Last 3 Encounters:  09/27/21 140/64  08/02/21 (!) 150/82  06/03/21 136/80   Pulse Readings from Last 3 Encounters:  09/27/21 71  08/02/21 (!) 58  06/03/21 71    Renal function: CrCl cannot be calculated (Patient's most recent lab result is older than the maximum 21 days allowed.).  Past Medical History:  Diagnosis Date   Aortic atherosclerosis ([place])    Carotid artery stenosis    s/p RCEA now with occluded RICA and 6-64% LICA stenosis by dopplers 10/2020   Chronic back pain    see surgeries    GERD (gastroesophageal reflux disease)    H/O: gout    Hyperlipidemia    Hypertension    PPD positive    Positive  PPD, remotely ~2000, s/p Rx     Pre-diabetes    Primary osteoarthritis of right hip    end stage   Stroke [facility])    " mild"   Wears dentures     Current Outpatient Medications on File Prior to Visit  Medication Sig Dispense Refill   amLODipine (NORVASC) 10 MG tablet Take 1 tablet (10 mg total) by mouth daily. 90 tablet 3   aspirin EC 81 MG EC tablet Take 1 tablet (81 mg total) by mouth daily.     atorvastatin (LIPITOR) 80 MG tablet Take 1 tablet (80 mg total) by mouth daily. 90 tablet 3   buprenorphine (SUBUTEX) 2 MG SUBL SL tablet Place 2 tablets under the tongue in the morning and at bedtime.     carvedilol (COREG) 25 MG tablet Take 1 tablet (25 mg total) by mouth 2 (two)  times daily with a meal. 180 tablet 3   clopidogrel (PLAVIX) 75 MG tablet TAKE 1 TABLET BY MOUTH  DAILY 90 tablet 1   hydrALAZINE (APRESOLINE) 100 MG tablet Take 1 tablet (100 mg total) by mouth 3 (three) times daily. 270 tablet 3   Multiple Vitamin (MULTIVITAMIN WITH MINERALS) TABS Take 1 tablet by mouth daily.     valsartan (DIOVAN) 320 MG tablet Take 1 tablet (320 mg total) by mouth daily. 90 tablet 3   No current facility-administered medications on file prior to visit.    No Known Allergies  There were no vitals taken for this visit.   Assessment/Plan:  1. Hypertension -    Thank you  Cyd Silence  Pharm D. Candidate  [facility] [place].D, BCPS, CPP [place]  [address], [place], [place] [address]  Phone: [phone]; Fax: [phone]

## 2021-10-14 ENCOUNTER — Ambulatory Visit: Payer: 59

## 2021-11-03 ENCOUNTER — Other Ambulatory Visit: Payer: Self-pay | Admitting: Cardiology

## 2021-11-03 ENCOUNTER — Other Ambulatory Visit: Payer: Self-pay | Admitting: Internal Medicine

## 2021-11-03 MED ORDER — ATORVASTATIN CALCIUM 80 MG PO TABS: 80.0000 mg | ORAL_TABLET | Freq: Every day | ORAL | 0 refills | Status: DC

## 2021-11-04 ENCOUNTER — Encounter (HOSPITAL_COMMUNITY): Payer: 59

## 2021-11-16 ENCOUNTER — Ambulatory Visit: Admit: 2021-11-16 | Payer: 59 | Admitting: Orthopedic Surgery

## 2021-11-16 SURGERY — ARTHROPLASTY, HIP, TOTAL, ANTERIOR APPROACH
Anesthesia: Spinal | Site: Hip | Laterality: Right

## 2021-11-18 ENCOUNTER — Other Ambulatory Visit: Payer: 59

## 2021-12-16 ENCOUNTER — Other Ambulatory Visit: Payer: Self-pay | Admitting: Cardiology

## 2021-12-16 DIAGNOSIS — R072 Precordial pain: Secondary | ICD-10-CM

## 2021-12-16 DIAGNOSIS — I10 Essential (primary) hypertension: Secondary | ICD-10-CM

## 2021-12-16 DIAGNOSIS — R002 Palpitations: Secondary | ICD-10-CM

## 2022-02-02 ENCOUNTER — Other Ambulatory Visit: Payer: Self-pay | Admitting: Internal Medicine

## 2022-02-17 ENCOUNTER — Encounter: Payer: Self-pay | Admitting: Internal Medicine

## 2022-03-10 ENCOUNTER — Ambulatory Visit: Payer: 59 | Admitting: Internal Medicine

## 2022-03-17 ENCOUNTER — Ambulatory Visit: Payer: 59 | Admitting: Internal Medicine

## 2022-03-17 VITALS — BP 140/72 | HR 63 | Temp 97.9°F | Resp 18 | Ht 72.0 in | Wt 234.0 lb

## 2022-03-17 DIAGNOSIS — I10 Essential (primary) hypertension: Secondary | ICD-10-CM | POA: Diagnosis not present

## 2022-03-17 DIAGNOSIS — E7800 Pure hypercholesterolemia, unspecified: Secondary | ICD-10-CM | POA: Diagnosis not present

## 2022-03-17 DIAGNOSIS — G4733 Obstructive sleep apnea (adult) (pediatric): Secondary | ICD-10-CM | POA: Diagnosis not present

## 2022-03-17 DIAGNOSIS — R7989 Other specified abnormal findings of blood chemistry: Secondary | ICD-10-CM

## 2022-03-17 LAB — COMPREHENSIVE METABOLIC PANEL
ALT: 14 U/L (ref 0–53)
AST: 14 U/L (ref 0–37)
Albumin: 4.3 g/dL (ref 3.5–5.2)
Alkaline Phosphatase: 89 U/L (ref 39–117)
BUN: 12 mg/dL (ref 6–23)
CO2: 26 mEq/L (ref 19–32)
Calcium: 9.6 mg/dL (ref 8.4–10.5)
Chloride: 103 mEq/L (ref 96–112)
Creatinine, Ser: 0.87 mg/dL (ref 0.40–1.50)
GFR: 96.68 mL/min (ref >60.00)
Glucose, Bld: 153 mg/dL — ABNORMAL HIGH (ref 70–99)
Potassium: 4.2 mEq/L (ref 3.5–5.1)
Sodium: 136 mEq/L (ref 135–145)
Total Bilirubin: 0.5 mg/dL (ref 0.2–1.2)
Total Protein: 7.1 g/dL (ref 6.0–8.3)

## 2022-03-17 LAB — CBC WITH DIFFERENTIAL/PLATELET
Basophils Absolute: 0.1 10*3/uL (ref 0.0–0.1)
Basophils Relative: 0.9 % (ref 0.0–3.0)
Eosinophils Absolute: 0.2 10*3/uL (ref 0.0–0.7)
Eosinophils Relative: 2.3 % (ref 0.0–5.0)
HCT: 40.2 % (ref 39.0–52.0)
Hemoglobin: 13.3 g/dL (ref 13.0–17.0)
Lymphocytes Relative: 29.2 % (ref 12.0–46.0)
Lymphs Abs: 2 10*3/uL (ref 0.7–4.0)
MCHC: 33.1 g/dL (ref 30.0–36.0)
MCV: 86.1 fl (ref 78.0–100.0)
Monocytes Absolute: 0.6 10*3/uL (ref 0.1–1.0)
Monocytes Relative: 8.4 % (ref 3.0–12.0)
Neutro Abs: 4 10*3/uL (ref 1.4–7.7)
Neutrophils Relative %: 59.2 % (ref 43.0–77.0)
Platelets: 265 10*3/uL (ref 150.0–400.0)
RBC: 4.67 Mil/uL (ref 4.22–5.81)
RDW: 12.9 % (ref 11.5–15.5)
WBC: 6.8 10*3/uL (ref 4.0–10.5)

## 2022-03-17 LAB — LIPID PANEL
Cholesterol: 187 mg/dL (ref 0–200)
HDL: 37.9 mg/dL — ABNORMAL LOW (ref >39.00)
LDL Cholesterol: 111 mg/dL — ABNORMAL HIGH (ref 0–99)
NonHDL: 149.22
Total CHOL/HDL Ratio: 5
Triglycerides: 191 mg/dL — ABNORMAL HIGH (ref 0.0–149.0)
VLDL: 38.2 mg/dL (ref 0.0–40.0)

## 2022-03-17 LAB — TSH: TSH: 0.71 u[IU]/mL (ref 0.35–5.50)

## 2022-03-17 NOTE — Progress Notes (Signed)
? ?Subjective:  ? ? Patient ID: Mike Shepard, male    DOB: 06/02/1966, 56 y.o.   MRN: [id number] ? ?DOS:  03/17/2022 ?Type of visit - description: Follow-up ? ?Today we talk about hypertension, sleep apnea, previous abnormal results. ?He saw cardiology and vascular surgery, notes reviewed. ?Ambulatory BPs in general 135/80, 140/70. ? ?He is trying to do better with diet. ?Is not using a CPAP just yet ?Denies chest pain or difficulty breathing. ?No lower extremity edema ? ?BP Readings from Last 3 Encounters:  ?03/17/22 140/72  ?09/27/21 140/64  ?08/02/21 (!) 150/82  ? ? ? ?Review of Systems ?See above  ? ?Past Medical History:  ?Diagnosis Date  ? Aortic atherosclerosis ([place])   ? Carotid artery stenosis   ? s/p RCEA now with occluded RICA and 4-09% LICA stenosis by dopplers 10/2020  ? Chronic back pain   ? see surgeries   ? GERD (gastroesophageal reflux disease)   ? H/O: gout   ? Hyperlipidemia   ? Hypertension   ? PPD positive   ? Positive PPD, remotely ~2000, s/p Rx    ? Pre-diabetes   ? Primary osteoarthritis of right hip   ? end stage  ? Stroke [facility])   ? " mild"  ? Wears dentures   ? ? ?Past Surgical History:  ?Procedure Laterality Date  ? BACK SURGERY    ? 2007-2013 (low back)  ? ENDARTERECTOMY Right 06/02/2019  ? Procedure: ENDARTERECTOMY CAROTID RIGHT;  Surgeon: Marty Heck, MD;  Location: [place];  Service: Vascular;  Laterality: Right;  ? INTRAOPERATIVE ARTERIOGRAM  06/02/2019  ? Procedure: Right carotid and cerebral angiogram;  Surgeon: Marty Heck, MD;  Location: [place];  Service: Vascular;;  ? MULTIPLE TOOTH EXTRACTIONS    ? NECK SURGERY    ? 2010  ? OPERATIVE ULTRASOUND Right 06/02/2019  ? Procedure: Operative Ultrasound;  Surgeon: Marty Heck, MD;  Location: [place];  Service: Vascular;  Laterality: Right;  ? ? ?Current Outpatient Medications  ?Medication Instructions  ? amLODipine (NORVASC) 10 MG tablet TAKE 1 TABLET BY MOUTH  DAILY  ? aspirin 81 mg, Oral, Daily  ? atorvastatin  (LIPITOR) 80 mg, Oral, Daily  ? buprenorphine (SUBUTEX) 2 MG SUBL SL tablet 2 tablets, Sublingual, 2 times daily  ? carvedilol (COREG) 25 mg, Oral, 2 times daily with meals  ? clopidogrel (PLAVIX) 75 MG tablet TAKE 1 TABLET BY MOUTH  DAILY  ? hydrALAZINE (APRESOLINE) 100 mg, Oral, 3 times daily  ? Multiple Vitamin (MULTIVITAMIN WITH MINERALS) TABS 1 tablet, Daily  ? valsartan (DIOVAN) 320 mg, Oral, Daily  ? ? ?   ?Objective:  ? Physical Exam ?BP 140/72 (BP Location: Left Arm, Patient Position: Sitting, Cuff Size: Normal)   Pulse 63   Temp 97.9 ?F (36.6 ?C) (Oral)   Resp 18   Ht 6' (1.829 m)   Wt 234 lb (106.1 kg)   SpO2 96%   BMI 31.74 kg/m?  ?General:   ?Well developed, NAD, BMI noted. ?HEENT:  ?Normocephalic . Face symmetric, atraumatic ?Lungs:  ?CTA B ?Normal respiratory effort, no intercostal retractions, no accessory muscle use. ?Heart: RRR,  no murmur.  ?Lower extremities: no pretibial edema bilaterally  ?Skin: Not pale. Not jaundice ?Neurologic:  ?alert & oriented X3.  ?Speech normal, gait appropriate for age and unassisted ?Psych--  ?Cognition and judgment appear intact.  ?Cooperative with normal attention span and concentration.  ?Behavior appropriate. ?No anxious or depressed appearing.  ? ?   ?Assessment   ?  Assessment ?DM: A1c 6.5 (08/2019) ?HTN ?Hyperlipidemia ?CV: ?--MRI- brain  02-2018: Remote infarct, that lead to further w/u, dx w/ carotid dz ?-- R endarterectomy 05/2019 ?---had w/u for secondary HTN 2022 ?-- neg stress test 02/2021 ?OSA Dx 02/2021 ?GERD ?Elevated TSH 2014 ?H/o gout  ?MSK:  ?-DJD hip ?--Chronic back pain ?+ PPD 2000, s/p  antibiotics ?Increase LFTs: ?noted 04/2019, coincide w/ statin RX, AP slightly elevated.U/S 02/2020: Fatty liver. 05/2020: Hep B negative, iron normal. ?Covid DX 12/25/2019 ? ? ?PLAN ?HTN: Dr. Radford Pax increased hydralazine to 100 mg 3 times daily, patient was encouraged to see Endo regards abnormal urine catecholamines. ?Plan: Continue present care, ambulatory BPs 135  03/26/1979, 140/72.  No change for now.  Check a CMP and CBC.  Again encouraged to see endocrinology ?Hyperlipidemia: October 2022, LDL was 83, atorvastatin dose increased.  Check FLP ?Sleep apnea: Saw cardiology Dr. Radford Pax 09/27/2021, was encouraged to use a CPAP which he is not doing just yet, strongly recommend to do, benefits this includes better control of BP and protection against cardiovascular events.  Patient verbalized understanding. ?Carotid stenosis:  ?-dopplers 07/2020 showed occluded RICA and 0-09% LICA ?No indication for revascularization of a occluded ICA.    ?Slightly decreased TSH per chart review: [place] lab ?RTC CPX 4 months  ? ?  ? ? ?This visit occurred during the SARS-CoV-2 public health emergency.  Safety protocols were in place, including screening questions prior to the visit, additional usage of staff PPE, and extensive cleaning of exam room while observing appropriate contact time as indicated for disinfecting solutions.  ? ?

## 2022-03-17 NOTE — Patient Instructions (Addendum)
Call [facility] endocrinology and get an appointment with one of the specialist: ?[phone] ? ?Is very important you start using your CPAP every night ? ?Check the  blood pressure regularly ?BP GOAL is between 110/65 and  135/85. ?If it is consistently higher or lower, let me know ? ?GO TO THE LAB : Get the blood work   ? ? ?[place], [place] ?Come back for for physical exam in 4 months ?

## 2022-03-17 NOTE — Assessment & Plan Note (Signed)
HTN: Dr. Radford Pax increased hydralazine to 100 mg 3 times daily, patient was encouraged to see Endo regards abnormal urine catecholamines. ?Plan: Continue present care, ambulatory BPs 135 03/26/1979, 140/72.  No change for now.  Check a CMP and CBC.  Again encouraged to see endocrinology ?Hyperlipidemia: October 2022, LDL was 83, atorvastatin dose increased.  Check FLP ?Sleep apnea: Saw cardiology Dr. Radford Pax 09/27/2021, was encouraged to use a CPAP which he is not doing just yet, strongly recommend to do, benefits this includes better control of BP and protection against cardiovascular events.  Patient verbalized understanding. ?Carotid stenosis:  ?-dopplers 07/2020 showed occluded RICA and 1-19% LICA ?No indication for revascularization of a occluded ICA.    ?Slightly decreased TSH per chart review: [place] lab ?RTC CPX 4 months  ?

## 2022-03-20 ENCOUNTER — Other Ambulatory Visit (INDEPENDENT_AMBULATORY_CARE_PROVIDER_SITE_OTHER): Payer: 59

## 2022-03-20 DIAGNOSIS — E119 Type 2 diabetes mellitus without complications: Secondary | ICD-10-CM | POA: Diagnosis not present

## 2022-03-20 LAB — HEMOGLOBIN A1C: Hgb A1c MFr Bld: 5.7 % (ref 4.6–6.5)

## 2022-03-23 MED ORDER — ATORVASTATIN CALCIUM 80 MG PO TABS: 80.0000 mg | ORAL_TABLET | Freq: Every day | ORAL | 1 refills | Status: DC

## 2022-03-23 MED ORDER — AMLODIPINE BESYLATE 10 MG PO TABS: 10.0000 mg | ORAL_TABLET | Freq: Every day | ORAL | 1 refills | Status: DC

## 2022-03-23 NOTE — Addendum Note (Signed)
Addended byDamita Dunnings D on: 03/23/2022 01:10 PM ? ? Modules accepted: Orders ? ?

## 2022-03-23 NOTE — Progress Notes (Incomplete)
Patient ID: JAYKE CAUL                 DOB: [birth year]                      MRN: [id number] ? ? ? ? ?HPI: ?Mike Shepard is a 56 y.o. male referred by Dr. Radford Pax to HTN clinic. PMH is significant for right carotid artery stenosis (s/p RCEA and occluded RICA and 9-32% LICA by dopplers 05/7123), GERD, HLD, HTN and CVA, pre-diabetes. ? ?Patient has been struggling with difficult to control HTN. HCTZ was switched to chlorthalidone, but had to be stopped due to bump in SCr. His valsartan was stopped in order to check renin-aldosterone panel. Hydralazine '25mg'$  three times a day was started. Increased to '50mg'$  three times a day when BP was in the 170-180's. He was having headaches. BP came down to the 150's on '50mg'$  three times a day.  ? ?At follow-up in February 2022, he was taking his carvedilol three times daily to get his BP at goal so he could return to work - BP 130-40s/70s in clinic. Headaches were gone, experienced occasional orthostasis. Instructed to take carvedilol twice daily and to restart valsartan 320 mg daily and lower hydralazine to 25 mg TID after his renin/aldosterone panel was normal and SCr returned to baseline. In Oct 2022, BP remained above goal (140/64) and hydralazine increased to 100 mg TID. ? ?Patient presents to PharmD clinic today *** ? ?- Any dizziness, balance, lightheadedness, headache, blurred vision, edema? ?- How is your diet going? Able to cut down on sodium / fast food more? ?- smokeless tobacco still? Active/still on feet? ?- Home BP log, home cuff ? ??Work day ??Med adherence ? ?BP reading - ask about home readings, if brought own cuff >> demonstrate technique and compare to our readings ? - when do you check BP in relation to meds (ideally few hrs post meds) ? ?Current HTN meds:  ?Carvedilol 25 mg BID ?Valsartan 320 mg daily ?Hydralazine 25 mg TID ?Amlodipine 10 mg daily ? ?Previously tried:  ?Valsartan (stopped to repeat renin/aldosterone) ?Chlorthalidone (increased  SCr) ?HCTZ (switched to chlorthalidone) ? ?BP goal: <130/80 mmHg ? ?Family History: Father - MI (died 40yo); Father (onset 47yo)/brother/sister - diabetes; Brother/mother/sister - HTN; Paternal uncle - colon cancer; Paternal grandfather - prostate cancer ? ?Social History: former smoker (quit 2011), uses smokeless tobacco; no alcohol use ? ?Diet: working ? ?breakfast: grits from [place] ?Lunch: [employer] ?Dinner: wife has stopped using salt, baked chicken, green beans, rice, baked pork chop, broccoli, mashed potatoes ?Stopped eating chip ?Salsa and tortilla chips ?Popcorn ?Drink: sprite, water, 2 cups of coffee ? ?Exercise: ***none in Feb 2022 ? ?Home BP readings:  ? ?Wt Readings from Last 3 Encounters:  ?03/17/22 234 lb (106.1 kg)  ?09/27/21 243 lb 6.4 oz (110.4 kg)  ?08/02/21 230 lb 3.2 oz (104.4 kg)  ? ?BP Readings from Last 3 Encounters:  ?03/17/22 140/72  ?09/27/21 140/64  ?08/02/21 (!) 150/82  ? ?Pulse Readings from Last 3 Encounters:  ?03/17/22 63  ?09/27/21 71  ?08/02/21 (!) 58  ? ? ?Renal function: ?Estimated Creatinine Clearance: 119.3 mL/min (by C-G formula based on SCr of 0.87 mg/dL). ? ?Past Medical History:  ?Diagnosis Date  ? Aortic atherosclerosis ([place])   ? Carotid artery stenosis   ? s/p RCEA now with occluded RICA and 5-80% LICA stenosis by dopplers 10/2020  ? Chronic back pain   ? see surgeries   ?  GERD (gastroesophageal reflux disease)   ? H/O: gout   ? Hyperlipidemia   ? Hypertension   ? PPD positive   ? Positive PPD, remotely ~2000, s/p Rx    ? Pre-diabetes   ? Primary osteoarthritis of right hip   ? end stage  ? Stroke [facility])   ? " mild"  ? Wears dentures   ? ? ?Current Outpatient Medications on File Prior to Visit  ?Medication Sig Dispense Refill  ? amLODipine (NORVASC) 10 MG tablet Take 1 tablet (10 mg total) by mouth daily. 90 tablet 1  ? aspirin EC 81 MG EC tablet Take 1 tablet (81 mg total) by mouth daily.    ? atorvastatin (LIPITOR) 80 MG tablet Take 1 tablet (80 mg total) by mouth  daily. 90 tablet 1  ? buprenorphine (SUBUTEX) 2 MG SUBL SL tablet Place 2 tablets under the tongue in the morning and at bedtime.    ? carvedilol (COREG) 25 MG tablet Take 1 tablet (25 mg total) by mouth 2 (two) times daily with a meal. 180 tablet 0  ? clopidogrel (PLAVIX) 75 MG tablet TAKE 1 TABLET BY MOUTH  DAILY 90 tablet 1  ? hydrALAZINE (APRESOLINE) 100 MG tablet Take 1 tablet (100 mg total) by mouth 3 (three) times daily. 270 tablet 3  ? Multiple Vitamin (MULTIVITAMIN WITH MINERALS) TABS Take 1 tablet by mouth daily.    ? valsartan (DIOVAN) 320 MG tablet Take 1 tablet (320 mg total) by mouth daily. 90 tablet 3  ? ?No current facility-administered medications on file prior to visit.  ? ? ?No Known Allergies ? ?There were no vitals taken for this visit. ? ? ?Assessment/Plan: ? ?1. Hypertension - Blood pressure is above goal <130/80 mmHg.  ? ??spiro/eplerenone ?Not crazy about starting clonidine/doxazosin just yet ? ?Thank you ? ?Laurey Arrow, PharmD ?PGY1 Pharmacy Resident ?[facility], [place], [place] [address]  ?Phone: [phone]; Fax: [phone]  ?

## 2022-03-24 ENCOUNTER — Other Ambulatory Visit: Payer: 59

## 2022-03-24 ENCOUNTER — Ambulatory Visit: Payer: 59

## 2022-05-23 ENCOUNTER — Other Ambulatory Visit: Payer: Self-pay | Admitting: Internal Medicine

## 2022-05-26 ENCOUNTER — Other Ambulatory Visit: Payer: Self-pay | Admitting: Internal Medicine

## 2022-06-09 ENCOUNTER — Ambulatory Visit (INDEPENDENT_AMBULATORY_CARE_PROVIDER_SITE_OTHER): Payer: 59 | Admitting: Internal Medicine

## 2022-06-09 ENCOUNTER — Encounter: Payer: Self-pay | Admitting: Internal Medicine

## 2022-06-09 VITALS — BP 136/74 | HR 67 | Temp 98.2°F | Resp 16 | Ht 72.0 in | Wt 235.5 lb

## 2022-06-09 DIAGNOSIS — R5383 Other fatigue: Secondary | ICD-10-CM

## 2022-06-09 DIAGNOSIS — E7800 Pure hypercholesterolemia, unspecified: Secondary | ICD-10-CM | POA: Diagnosis not present

## 2022-06-09 DIAGNOSIS — G4733 Obstructive sleep apnea (adult) (pediatric): Secondary | ICD-10-CM | POA: Diagnosis not present

## 2022-06-09 DIAGNOSIS — E119 Type 2 diabetes mellitus without complications: Secondary | ICD-10-CM | POA: Diagnosis not present

## 2022-06-09 DIAGNOSIS — I10 Essential (primary) hypertension: Secondary | ICD-10-CM

## 2022-06-09 DIAGNOSIS — Z0001 Encounter for general adult medical examination with abnormal findings: Secondary | ICD-10-CM

## 2022-06-09 DIAGNOSIS — Z0000 Encounter for general adult medical examination without abnormal findings: Secondary | ICD-10-CM

## 2022-06-10 LAB — TSH: TSH: 0.79 mIU/L (ref 0.40–4.50)

## 2022-06-10 LAB — PSA: PSA: 1.68 ng/mL (ref <=4.00)

## 2022-06-10 LAB — ALT: ALT: 16 U/L (ref 9–46)

## 2022-06-10 LAB — LIPID PANEL
Cholesterol: 203 mg/dL — ABNORMAL HIGH (ref <200)
HDL: 49 mg/dL (ref >=40)
LDL Cholesterol (Calc): 128 mg/dL (calc) — ABNORMAL HIGH
Non-HDL Cholesterol (Calc): 154 mg/dL (calc) — ABNORMAL HIGH (ref <130)
Total CHOL/HDL Ratio: 4.1 (calc) (ref <5.0)
Triglycerides: 146 mg/dL (ref <150)

## 2022-06-10 LAB — AST: AST: 16 U/L (ref 10–35)

## 2022-06-11 ENCOUNTER — Encounter: Payer: Self-pay | Admitting: Internal Medicine

## 2022-06-11 NOTE — Assessment & Plan Note (Signed)
-  Td 2016 - shingrix: d/w pt - covid vax: Booster is an option -CCS: cscope 11-2020, next 5 years per GI letter -prostate ca screening: + FH prostate cancer, GF age 56; no symptoms, DRE negative, check a psa. -Labs: FLP, AST, ALT, TSH PSA --Diet exercise: Discussed --Tobacco use: +dip, counseled, to see a dentist soon --Lung cancer screening: Smoking history 24 pack a year, quit 8 years ago.  CT lung cancer screening 08-2021, next 1 year -ACP info provided

## 2022-06-12 MED ORDER — EZETIMIBE 10 MG PO TABS: 10.0000 mg | ORAL_TABLET | Freq: Every day | ORAL | 3 refills | Status: DC

## 2022-06-26 ENCOUNTER — Telehealth: Payer: Self-pay | Admitting: Internal Medicine

## 2022-06-26 MED ORDER — CLOPIDOGREL BISULFATE 75 MG PO TABS: 75.0000 mg | ORAL_TABLET | Freq: Every day | ORAL | 1 refills | Status: DC

## 2022-06-26 NOTE — Telephone Encounter (Signed)
Rx sent 

## 2022-06-26 NOTE — Telephone Encounter (Signed)
Pt would like tp pick up refill at a local pharm.   Medication: clopidogrel (PLAVIX) 75 MG tablet  Has the patient contacted their pharmacy? No.  Preferred Pharmacy:  [facility], [place].   [address] [place] [address]  Phone:  [phone]  Fax:  [phone]

## 2022-08-04 ENCOUNTER — Encounter: Payer: Self-pay | Admitting: Internal Medicine

## 2022-08-23 ENCOUNTER — Other Ambulatory Visit: Payer: Self-pay | Admitting: Internal Medicine

## 2022-08-28 ENCOUNTER — Inpatient Hospital Stay: Admission: RE | Admit: 2022-08-28 | Payer: 59 | Source: Ambulatory Visit

## 2022-09-01 ENCOUNTER — Ambulatory Visit
Admission: RE | Admit: 2022-09-01 | Discharge: 2022-09-01 | Disposition: A | Discharge status: Home / Self Care | Payer: 59 | Source: Ambulatory Visit | Attending: Acute Care | Admitting: Acute Care

## 2022-09-01 DIAGNOSIS — Z87891 Personal history of nicotine dependence: Secondary | ICD-10-CM

## 2022-09-11 ENCOUNTER — Other Ambulatory Visit: Payer: Self-pay | Admitting: Acute Care

## 2022-09-11 DIAGNOSIS — Z122 Encounter for screening for malignant neoplasm of respiratory organs: Secondary | ICD-10-CM

## 2022-09-11 DIAGNOSIS — Z87891 Personal history of nicotine dependence: Secondary | ICD-10-CM

## 2022-10-02 ENCOUNTER — Other Ambulatory Visit: Payer: Self-pay | Admitting: Cardiology

## 2022-11-05 ENCOUNTER — Other Ambulatory Visit: Payer: Self-pay | Admitting: Cardiology

## 2022-11-06 ENCOUNTER — Telehealth: Payer: Self-pay | Admitting: *Deleted

## 2022-11-06 NOTE — Telephone Encounter (Signed)
Received surgical clearance form from [facility]. Pt needing R total hip arthroplasty w/ Dr. Lyla Glassing. Surgery date: TBD. Mychart message sent to Pt instructing him to call and schedule appt.

## 2022-11-06 NOTE — Telephone Encounter (Signed)
Left message to call and schedule in office pre op appt with Dr. Radford Pax or APP.

## 2022-11-06 NOTE — Telephone Encounter (Signed)
    Primary Cardiologist:Traci Turner, MD  Chart reviewed as part of pre-operative protocol coverage. Because of Mike Shepard's past medical history and time since last visit, he/she will require a follow-up visit in order to better assess preoperative cardiovascular risk.  Pre-op covering staff: - Please schedule appointment and call patient to inform them. - Please contact requesting surgeon's office via preferred method (i.e, phone, fax) to inform them of need for appointment prior to surgery.  If applicable, this message will also be routed to pharmacy pool and/or primary cardiologist for input on holding anticoagulant/antiplatelet agent as requested below so that this information is available at time of patient's appointment.   Deberah Pelton, NP  11/06/2022, 12:39 PM

## 2022-11-06 NOTE — Telephone Encounter (Signed)
   Pre-operative Risk Assessment    Patient Name: Mike Shepard  DOB: 10/31/1966 MRN: [id number]      Request for Surgical Clearance    Procedure:   RIGHT TOTAL HIP ARTHROPLASTY  Date of Surgery:  Clearance TBD                                 Surgeon:  DR. Rod Can Surgeon's Group or Practice Name:  [facility] Phone number:  [phone]  Fax number:  [phone] ATTN: KERRI MAZE   Type of Clearance Requested:   - Medical  - Pharmacy:  Hold Aspirin and Clopidogrel (Plavix)     Type of Anesthesia:   CHOICE   Additional requests/questions:    Jiles Prows   11/06/2022, 11:51 AM

## 2022-11-07 NOTE — Telephone Encounter (Signed)
Pt has appt 11/24/22 with Nicholes Rough, PAC for pre op clearance.

## 2022-11-14 ENCOUNTER — Encounter: Payer: Self-pay | Admitting: Internal Medicine

## 2022-11-14 ENCOUNTER — Ambulatory Visit: Payer: 59 | Admitting: Internal Medicine

## 2022-11-14 VITALS — BP 150/82 | HR 80 | Temp 98.0°F | Resp 18 | Ht 72.0 in | Wt 238.0 lb

## 2022-11-14 DIAGNOSIS — J029 Acute pharyngitis, unspecified: Secondary | ICD-10-CM

## 2022-11-14 DIAGNOSIS — J40 Bronchitis, not specified as acute or chronic: Secondary | ICD-10-CM

## 2022-11-14 LAB — POCT RAPID STREP A (OFFICE): Rapid Strep A Screen: NEGATIVE

## 2022-11-14 MED ORDER — AZITHROMYCIN 250 MG PO TABS: ORAL_TABLET | ORAL | 0 refills | Status: DC

## 2022-11-14 NOTE — Assessment & Plan Note (Signed)
Bronchitis: sxs C/W bronchitis, strep test negative. Plan: Rest, fluids, Zithromax, Robitussin-DM.  Work note provided.  Call if not better. DJD: Needs a right hip replacement, to see cardiology soon for clearance, will see me at the end of December for a physical as well. Flu shot: Has not taken in years, declines. RTC as scheduled for next month

## 2022-11-14 NOTE — Progress Notes (Signed)
Subjective:    Patient ID: Mike Shepard, male    DOB: [birth year], 56 y.o.   MRN: [id number]  DOS:  11/14/2022 Type of visit - description: acute  Symptoms started over a week ago with sore throat and cough. Cough is worse at night. + Brownish sputum. He denies nausea vomiting No unusual aches and pains Daughter has similar symptoms No chest pain no difficulty breathing   BP Readings from Last 3 Encounters:  11/14/22 (!) 150/82  06/09/22 136/74  03/17/22 140/72    Review of Systems See above   Past Medical History:  Diagnosis Date   Aortic atherosclerosis ([place])    Carotid artery stenosis    s/p RCEA now with occluded RICA and 5-63% LICA stenosis by dopplers 10/2020   Chronic back pain    see surgeries    GERD (gastroesophageal reflux disease)    H/O: gout    Hyperlipidemia    Hypertension    PPD positive    Positive PPD, remotely ~2000, s/p Rx     Pre-diabetes    Primary osteoarthritis of right hip    end stage   Stroke [facility])    " mild"   Wears dentures     Past Surgical History:  Procedure Laterality Date   BACK SURGERY     2007-2013 (low back)   ENDARTERECTOMY Right 06/02/2019   Procedure: ENDARTERECTOMY CAROTID RIGHT;  Surgeon: Marty Heck, MD;  Location: [place];  Service: Vascular;  Laterality: Right;   INTRAOPERATIVE ARTERIOGRAM  06/02/2019   Procedure: Right carotid and cerebral angiogram;  Surgeon: Marty Heck, MD;  Location: [place];  Service: Vascular;;   MULTIPLE TOOTH EXTRACTIONS     NECK SURGERY     2010   OPERATIVE ULTRASOUND Right 06/02/2019   Procedure: Operative Ultrasound;  Surgeon: Marty Heck, MD;  [facility];  Service: Vascular;  Laterality: Right;    Current Outpatient Medications  Medication Instructions   amLODipine (NORVASC) 10 mg, Oral, Daily   aspirin EC 81 mg, Oral, Daily   atorvastatin (LIPITOR) 80 mg, Oral, Daily   buprenorphine (SUBUTEX) 2 MG SUBL SL tablet 2 tablets, Sublingual, 2 times  daily   carvedilol (COREG) 25 mg, Oral, 2 times daily with meals   clopidogrel (PLAVIX) 75 mg, Oral, Daily   ezetimibe (ZETIA) 10 mg, Oral, Daily   hydrALAZINE (APRESOLINE) 100 mg, Oral, 3 times daily, Please call to schedule an overdue appointment with Dr. Radford Pax for refills, [phone], thank you. 2ND ATTEMPT   Multiple Vitamin (MULTIVITAMIN WITH MINERALS) TABS 1 tablet, Daily   valsartan (DIOVAN) 320 mg, Oral, Daily, Please call to schedule an overdue appointment with Dr. Radford Pax for refills, [phone], thank you. 2ND ATTEMPT       Objective:   Physical Exam BP (!) 150/82   Pulse 80   Temp 98 F (36.7 C) (Oral)   Resp 18   Ht 6' (1.829 m)   Wt 238 lb (108 kg)   SpO2 93%   BMI 32.28 kg/m  General:   Well developed, NAD, BMI noted. HEENT:  Normocephalic . Face symmetric, atraumatic TMs: Obscured by wax Throat: No redness or white patches Lungs:  Rhonchi bilaterally without wheezing or crackles Normal respiratory effort, no intercostal retractions, no accessory muscle use. Heart: RRR,  no murmur.  Lower extremities: no pretibial edema bilaterally  Skin: Not pale. Not jaundice Neurologic:  alert & oriented X3.  Speech normal, gait appropriate for age and unassisted Psych--  Cognition and judgment  appear intact.  Cooperative with normal attention span and concentration.  Behavior appropriate. No anxious or depressed appearing.      Assessment     Assessment DM: A1c 6.5 (08/2019) HTN Hyperlipidemia CV: --MRI- brain  02-2018: Remote infarct, that lead to further w/u, dx w/ carotid dz -- R endarterectomy 05/2019 ---had w/u for secondary HTN 2022 -- neg stress test 02/2021 OSA Dx 02/2021 GERD Elevated TSH 2014 H/o gout  MSK:  -DJD hip --Chronic back pain + PPD 2000, s/p  antibiotics Increase LFTs: noted 04/2019, coincide w/ statin RX, AP slightly elevated.U/S 02/2020: Fatty liver. 05/2020: Hep B negative, iron normal.    PLAN Bronchitis: sxs C/W bronchitis,  strep test negative. Plan: Rest, fluids, Zithromax, Robitussin-DM.  Work note provided.  Call if not better. DJD: Needs a right hip replacement, to see cardiology soon for clearance, will see me at the end of December for a physical as well. Flu shot: Has not taken in years, declines. RTC as scheduled for next month

## 2022-11-14 NOTE — Patient Instructions (Addendum)
Rest Drink plenty of fluids Start antibiotic called Zithromax For cough: Take Robitussin-DM over-the-counter as needed Call if not gradually better in the next 7 or 10 days Call if your symptoms are severe      Per our records you are due for your diabetic eye exam. Please contact your eye doctor to schedule an appointment. Please have them send copies of your office visit notes to [place]. Our fax number is [phone]. If you need a referral to an eye doctor please let us know.

## 2022-11-15 ENCOUNTER — Other Ambulatory Visit: Payer: Self-pay | Admitting: Cardiology

## 2022-11-21 ENCOUNTER — Other Ambulatory Visit: Payer: Self-pay | Admitting: *Deleted

## 2022-11-21 DIAGNOSIS — I6523 Occlusion and stenosis of bilateral carotid arteries: Secondary | ICD-10-CM

## 2022-11-23 NOTE — Progress Notes (Unsigned)
Office Visit    Patient Name: Mike Shepard Date of Encounter: 11/23/2022  PCP:  [facility], [place]  Cardiologist:  Fransico Him, MD  Advanced Practice Provider:  No care team member to display Electrophysiologist:  None   HPI    Mike Shepard is a 56 y.o. male with a past medical history significant for right carotid artery stenosis (status post R CEA with now occluded R ICA and 1 to 97% LICA by Dopplers, GERD, hypertension, hyperlipidemia, and CVA presents today for follow-up appointment  He recently underwent a home sleep study for hypertension and excessive daytime sleepiness and showed mild OSA with saturations as low as 88%.  Auto CPAP was ordered but he had not received his device at that time.  When he was seen in the clinic he was having chest pain and a Lexiscan Myoview showed no ischemia.  Low risk study.  He is also having palpitations and heart monitor showed rare PVCs and PACs.  Referred to Dr. Alvester Chou for abnormal renal Dopplers but felt his essential hypertension and not renovascular hypertension.  TSH and Aldo/renin levels were normal.  Urine catecholamines were abnormal and he was referred to endocrine for further evaluation.  He missed an appointment at that time and needed to get back on schedule  He was last seen October 2022 and at that time was doing well.  He denied any CV symptoms.  Every once in a while he felt his heart skipping a beat but otherwise doing well.  Today, he ***  Past Medical History    Past Medical History:  Diagnosis Date   Aortic atherosclerosis ([place])    Carotid artery stenosis    s/p RCEA now with occluded RICA and 6-73% LICA stenosis by dopplers 10/2020   Chronic back pain    see surgeries    GERD (gastroesophageal reflux disease)    H/O: gout    Hyperlipidemia    Hypertension    PPD positive    Positive PPD, remotely ~2000, s/p Rx     Pre-diabetes    Primary osteoarthritis of right hip     end stage   Stroke [facility])    " mild"   Wears dentures    Past Surgical History:  Procedure Laterality Date   BACK SURGERY     2007-2013 (low back)   ENDARTERECTOMY Right 06/02/2019   Procedure: ENDARTERECTOMY CAROTID RIGHT;  Surgeon: Marty Heck, MD;  Location: [place];  Service: Vascular;  Laterality: Right;   INTRAOPERATIVE ARTERIOGRAM  06/02/2019   Procedure: Right carotid and cerebral angiogram;  Surgeon: Marty Heck, MD;  Location: [place];  Service: Vascular;;   MULTIPLE TOOTH EXTRACTIONS     NECK SURGERY     2010   OPERATIVE ULTRASOUND Right 06/02/2019   Procedure: Operative Ultrasound;  Surgeon: Marty Heck, MD;  Location: [place];  Service: Vascular;  Laterality: Right;    Allergies  No Known Allergies   EKGs/Labs/Other Studies Reviewed:   The following studies were reviewed today: ***  EKG:  EKG is *** ordered today.  The ekg ordered today demonstrates ***  Recent Labs: 03/17/2022: BUN 12; Creatinine, Ser 0.87; Hemoglobin 13.3; Platelets 265.0; Potassium 4.2; Sodium 136 06/09/2022: ALT 16; TSH 0.79  Recent Lipid Panel    Component Value Date/Time   CHOL 203 (H) 06/09/2022 1443   CHOL 156 09/27/2021 1111   TRIG 146 06/09/2022 1443   HDL 49 06/09/2022 1443  HDL 46 09/27/2021 1111   CHOLHDL 4.1 06/09/2022 1443   VLDL 38.2 03/17/2022 1039   LDLCALC 128 (H) 06/09/2022 1443   LDLDIRECT 87.0 06/26/2019 0943    Risk Assessment/Calculations:  {Does this patient have ATRIAL FIBRILLATION?:[phone]}  Home Medications   No outpatient medications have been marked as taking for the 11/24/22 encounter (Appointment) with Elgie Collard, PA-C.     Review of Systems   ***   All other systems reviewed and are otherwise negative except as noted above.  Physical Exam    VS:  There were no vitals taken for this visit. , BMI There is no height or weight on file to calculate BMI.  Wt Readings from Last 3 Encounters:  11/14/22 238 lb (108 kg)   06/09/22 235 lb 8 oz (106.8 kg)  03/17/22 234 lb (106.1 kg)     GEN: Well nourished, well developed, in no acute distress. HEENT: normal. Neck: Supple, no JVD, carotid bruits, or masses. Cardiac: ***RRR, no murmurs, rubs, or gallops. No clubbing, cyanosis, edema.  ***Radials/PT 2+ and equal bilaterally.  Respiratory:  ***Respirations regular and unlabored, clear to auscultation bilaterally. GI: Soft, nontender, nondistended. MS: No deformity or atrophy. Skin: Warm and dry, no rash. Neuro:  Strength and sensation are intact. Psych: Normal affect.  Assessment & Plan    OSA Hypertension Bilateral carotid artery stenosis Hyperlipidemia Chest pain, unspecified Palpitations  No BP recorded.  {Refresh Note OR Click here to enter BP  :1}***      Disposition: Follow up {follow up:15908} with Fransico Him, MD or APP.  Signed, Elgie Collard, PA-C 11/23/2022, 10:18 PM [facility]

## 2022-11-24 ENCOUNTER — Ambulatory Visit: Payer: 59 | Admitting: Physician Assistant

## 2022-11-24 DIAGNOSIS — I10 Essential (primary) hypertension: Secondary | ICD-10-CM

## 2022-11-24 DIAGNOSIS — I6523 Occlusion and stenosis of bilateral carotid arteries: Secondary | ICD-10-CM

## 2022-11-24 DIAGNOSIS — R079 Chest pain, unspecified: Secondary | ICD-10-CM

## 2022-11-24 DIAGNOSIS — G4733 Obstructive sleep apnea (adult) (pediatric): Secondary | ICD-10-CM

## 2022-11-24 DIAGNOSIS — R002 Palpitations: Secondary | ICD-10-CM

## 2022-11-28 ENCOUNTER — Ambulatory Visit: Payer: 59 | Admitting: Vascular Surgery

## 2022-11-28 ENCOUNTER — Encounter (HOSPITAL_COMMUNITY): Payer: 59

## 2022-11-30 ENCOUNTER — Other Ambulatory Visit: Payer: Self-pay | Admitting: Cardiology

## 2022-12-08 ENCOUNTER — Ambulatory Visit: Payer: 59 | Admitting: Internal Medicine

## 2022-12-12 ENCOUNTER — Other Ambulatory Visit: Payer: Self-pay | Admitting: Cardiology

## 2022-12-12 ENCOUNTER — Other Ambulatory Visit: Payer: Self-pay | Admitting: Internal Medicine

## 2022-12-15 ENCOUNTER — Encounter: Payer: Self-pay | Admitting: Internal Medicine

## 2022-12-15 ENCOUNTER — Ambulatory Visit (INDEPENDENT_AMBULATORY_CARE_PROVIDER_SITE_OTHER): Payer: 59 | Admitting: Internal Medicine

## 2022-12-15 VITALS — BP 138/86 | HR 68 | Temp 98.0°F | Resp 18 | Ht 72.0 in | Wt 240.0 lb

## 2022-12-15 DIAGNOSIS — Z01818 Encounter for other preprocedural examination: Secondary | ICD-10-CM

## 2022-12-15 DIAGNOSIS — I10 Essential (primary) hypertension: Secondary | ICD-10-CM | POA: Diagnosis not present

## 2022-12-15 DIAGNOSIS — E119 Type 2 diabetes mellitus without complications: Secondary | ICD-10-CM

## 2022-12-15 DIAGNOSIS — E7800 Pure hypercholesterolemia, unspecified: Secondary | ICD-10-CM | POA: Diagnosis not present

## 2022-12-15 MED ORDER — BUPRENORPHINE HCL 2 MG SL SUBL: 2.0000 mg | SUBLINGUAL_TABLET | Freq: Every day | SUBLINGUAL | Status: DC

## 2022-12-15 NOTE — Progress Notes (Signed)
Subjective:    Patient ID: Mike Shepard, male    DOB: 1966-05-08, 56 y.o.   MRN: [id number]  DOS:  12/15/2022 Type of visit - description: 6 m f/u  Today we talk about his chronic medical problems. In general feeling well. He denies chest pain or difficulty breathing No edema Occasionally has palpitations, that is going on for years and is not getting worse. Denies any lower extremity paresthesias. Taking medications regularly although he said sometimes forgets Zetia.  Review of Systems See above   Past Medical History:  Diagnosis Date   Aortic atherosclerosis ([place])    Carotid artery stenosis    s/p RCEA now with occluded RICA and 4-40% LICA stenosis by dopplers 10/2020   Chronic back pain    see surgeries    GERD (gastroesophageal reflux disease)    H/O: gout    Hyperlipidemia    Hypertension    PPD positive    Positive PPD, remotely ~2000, s/p Rx     Pre-diabetes    Primary osteoarthritis of right hip    end stage   Stroke [facility])    " mild"   Wears dentures     Past Surgical History:  Procedure Laterality Date   BACK SURGERY     2007-2013 (low back)   ENDARTERECTOMY Right 06/02/2019   Procedure: ENDARTERECTOMY CAROTID RIGHT;  Surgeon: Marty Heck, MD;  Location: [place];  Service: Vascular;  Laterality: Right;   INTRAOPERATIVE ARTERIOGRAM  06/02/2019   Procedure: Right carotid and cerebral angiogram;  Surgeon: Marty Heck, MD;  Location: [place];  Service: Vascular;;   MULTIPLE TOOTH EXTRACTIONS     NECK SURGERY     2010   OPERATIVE ULTRASOUND Right 06/02/2019   Procedure: Operative Ultrasound;  Surgeon: Marty Heck, MD;  [facility];  Service: Vascular;  Laterality: Right;    Current Outpatient Medications  Medication Instructions   amLODipine (NORVASC) 10 mg, Oral, Daily   aspirin EC 81 mg, Oral, Daily   atorvastatin (LIPITOR) 80 mg, Oral, Daily   buprenorphine (SUBUTEX) 2 mg, Sublingual, Daily   carvedilol (COREG) 25 mg,  Oral, 2 times daily with meals   clopidogrel (PLAVIX) 75 mg, Oral, Daily   ezetimibe (ZETIA) 10 mg, Oral, Daily   hydrALAZINE (APRESOLINE) 100 mg, Oral, 3 times daily   Multiple Vitamin (MULTIVITAMIN WITH MINERALS) TABS 1 tablet, Daily   valsartan (DIOVAN) 320 mg, Oral, Daily       Objective:   Physical Exam BP 138/86   Pulse 68   Temp 98 F (36.7 C) (Oral)   Resp 18   Ht 6' (1.829 m)   Wt 240 lb (108.9 kg)   SpO2 97%   BMI 32.55 kg/m  General:   Well developed, NAD, BMI noted. HEENT:  Normocephalic . Face symmetric, atraumatic Lungs:  CTA B Normal respiratory effort, no intercostal retractions, no accessory muscle use. Heart: RRR,  no murmur.  DM foot exam: No edema, good pedal pulses, toes well-perfused, pinprick examination normal Skin: Not pale. Not jaundice Neurologic:  alert & oriented X3.  Speech normal, gait appropriate for age and unassisted Psych--  Cognition and judgment appear intact.  Cooperative with normal attention span and concentration.  Behavior appropriate. No anxious or depressed appearing.      Assessment    Assessment DM: A1c 6.5 (08/2019) HTN Hyperlipidemia CV: --MRI- brain  02-2018: Remote infarct, that lead to further w/u, dx w/ carotid dz -- R endarterectomy 05/2019 ---had w/u for  secondary HTN 2022 -- neg stress test 02/2021 OSA Dx 02/2021, no CPAP as off 11-2022 GERD Elevated TSH 2014 H/o gout  MSK:  -DJD hip --Chronic back pain + PPD 2000, s/p  antibiotics Increase LFTs: noted 04/2019, coincide w/ statin RX, AP slightly elevated.U/S 02/2020: Fatty liver. 05/2020: Hep B negative, iron normal.   PLAN  DM: Diet controlled, feet exam negative, check A1c and micro. HTN: BP today is good, at home is in the 130s, 140s.  For now continue amlodipine, carvedilol, hydralazine and Diovan. Checking labs High cholesterol: LDL was 128 (June 2023), on atorvastatin, Zetia was added, checking labs. OSA: Still not on CPAP, plans to discuss with  cardiology in few days.  Again risk of untreated OSA discussed with the patient. Hip DJD: In need to go to right hip surgery, surgery time TBD. EKG today NSR.  He is clear from my side, needs cardiology clearance Preventive care: Decline vaccines RTC 6 months CPX

## 2022-12-15 NOTE — Patient Instructions (Addendum)
Vaccines I recommend:  Shingrix (shingles) Flu shot Covid booster  Check the  blood pressure regularly BP GOAL is between 110/65 and  135/85. If it is consistently higher or lower, let me know    GO TO THE LAB : Get the blood work     Mike Shepard, [place] back for   a physical exam in 6 months      Per our records you are due for your diabetic eye exam. Please contact your eye doctor to schedule an appointment. Please have them send copies of your office visit notes to [place]. Our fax number is [phone]. If you need a referral to an eye doctor please let us know.

## 2022-12-16 LAB — AST: AST: 16 U/L (ref 10–35)

## 2022-12-16 LAB — BASIC METABOLIC PANEL
BUN: 7 mg/dL (ref 7–25)
CO2: 27 mmol/L (ref 20–32)
Calcium: 9.7 mg/dL (ref 8.6–10.3)
Chloride: 102 mmol/L (ref 98–110)
Creat: 0.91 mg/dL (ref 0.70–1.30)
Glucose, Bld: 103 mg/dL — ABNORMAL HIGH (ref 65–99)
Potassium: 4.5 mmol/L (ref 3.5–5.3)
Sodium: 138 mmol/L (ref 135–146)

## 2022-12-16 LAB — HEMOGLOBIN A1C
Hgb A1c MFr Bld: 5.9 % of total Hgb — ABNORMAL HIGH (ref <5.7)
Mean Plasma Glucose: 123 mg/dL
eAG (mmol/L): 6.8 mmol/L

## 2022-12-16 LAB — LIPID PANEL
Cholesterol: 162 mg/dL (ref <200)
HDL: 46 mg/dL (ref >=40)
LDL Cholesterol (Calc): 91 mg/dL (calc)
Non-HDL Cholesterol (Calc): 116 mg/dL (calc) (ref <130)
Total CHOL/HDL Ratio: 3.5 (calc) (ref <5.0)
Triglycerides: 149 mg/dL (ref <150)

## 2022-12-16 LAB — CBC WITH DIFFERENTIAL/PLATELET
Absolute Monocytes: 616 cells/uL (ref 200–950)
Basophils Absolute: 53 cells/uL (ref 0–200)
Basophils Relative: 0.7 %
Eosinophils Absolute: 167 cells/uL (ref 15–500)
Eosinophils Relative: 2.2 %
HCT: 42 % (ref 38.5–50.0)
Hemoglobin: 14.4 g/dL (ref 13.2–17.1)
Lymphs Abs: 2538 cells/uL (ref 850–3900)
MCH: 28.9 pg (ref 27.0–33.0)
MCHC: 34.3 g/dL (ref 32.0–36.0)
MCV: 84.3 fL (ref 80.0–100.0)
MPV: 11.3 fL (ref 7.5–12.5)
Monocytes Relative: 8.1 %
Neutro Abs: 4226 cells/uL (ref 1500–7800)
Neutrophils Relative %: 55.6 %
Platelets: 273 10*3/uL (ref 140–400)
RBC: 4.98 10*6/uL (ref 4.20–5.80)
RDW: 12.8 % (ref 11.0–15.0)
Total Lymphocyte: 33.4 %
WBC: 7.6 10*3/uL (ref 3.8–10.8)

## 2022-12-16 LAB — ALT: ALT: 15 U/L (ref 9–46)

## 2022-12-16 LAB — MICROALBUMIN / CREATININE URINE RATIO
Creatinine, Urine: 102 mg/dL (ref 20–320)
Microalb Creat Ratio: 14 mcg/mg creat (ref <30)
Microalb, Ur: 1.4 mg/dL

## 2022-12-16 NOTE — Assessment & Plan Note (Signed)
DM: Diet controlled, feet exam negative, check A1c and micro. HTN: BP today is good, at home is in the 130s, 140s.  For now continue amlodipine, carvedilol, hydralazine and Diovan. Checking labs High cholesterol: LDL was 128 (June 2023), on atorvastatin, Zetia was added, checking labs. OSA: Still not on CPAP, plans to discuss with cardiology in few days.  Again risk of untreated OSA discussed with the patient. Hip DJD: In need to go to right hip surgery, surgery time TBD. EKG today NSR.  He is clear from my side, needs cardiology clearance Preventive care: Decline vaccines RTC 6 months CPX

## 2022-12-17 NOTE — Progress Notes (Unsigned)
Office Visit    Patient Name: Mike Shepard Date of Encounter: 12/17/2022  PCP:  Colon Branch, [place]  Cardiologist:  Fransico Him, MD  Advanced Practice Provider:  No care team member to display Electrophysiologist:  None   HPI    Mike Shepard is a 56 y.o. male with a past medical history significant for right carotid artery stenosis (status post R CEA with now occluded R ICA and 1 to 70% LICA by Dopplers, GERD, hypertension, hyperlipidemia, and CVA presents today for follow-up appointment  He recently underwent a home sleep study for hypertension and excessive daytime sleepiness and showed mild OSA with saturations as low as 88%.  Auto CPAP was ordered but he had not received his device at that time.  When he was seen in the clinic he was having chest pain and a Lexiscan Myoview showed no ischemia.  Low risk study.  He is also having palpitations and heart monitor showed rare PVCs and PACs.  Referred to Dr. Alvester Chou for abnormal renal Dopplers but felt his essential hypertension and not renovascular hypertension.  TSH and Aldo/renin levels were normal.  Urine catecholamines were abnormal and he was referred to endocrine for further evaluation.  He missed an appointment at that time and needed to get back on schedule  He was last seen October 2022 and at that time was doing well.  He denied any CV symptoms.  Every once in a while he felt his heart skipping a beat but otherwise doing well.  Today, he ***  Past Medical History    Past Medical History:  Diagnosis Date   Aortic atherosclerosis ([place])    Carotid artery stenosis    s/p RCEA now with occluded RICA and 6-23% LICA stenosis by dopplers 10/2020   Chronic back pain    see surgeries    GERD (gastroesophageal reflux disease)    H/O: gout    Hyperlipidemia    Hypertension    PPD positive    Positive PPD, remotely ~2000, s/p Rx     Pre-diabetes    Primary osteoarthritis of right  hip    end stage   Stroke [facility])    " mild"   Wears dentures    Past Surgical History:  Procedure Laterality Date   BACK SURGERY     2007-2013 (low back)   ENDARTERECTOMY Right 06/02/2019   Procedure: ENDARTERECTOMY CAROTID RIGHT;  Surgeon: Marty Heck, MD;  Location: [facility];  Service: Vascular;  Laterality: Right;   INTRAOPERATIVE ARTERIOGRAM  06/02/2019   Procedure: Right carotid and cerebral angiogram;  Surgeon: Marty Heck, MD;  Location: [place];  Service: Vascular;;   MULTIPLE TOOTH EXTRACTIONS     NECK SURGERY     2010   OPERATIVE ULTRASOUND Right 06/02/2019   Procedure: Operative Ultrasound;  Surgeon: Marty Heck, MD;  Location: [facility];  Service: Vascular;  Laterality: Right;    Allergies  No Known Allergies   EKGs/Labs/Other Studies Reviewed:   The following studies were reviewed today: ***  EKG:  EKG is *** ordered today.  The ekg ordered today demonstrates ***  Recent Labs: 06/09/2022: TSH 0.79 12/15/2022: ALT 15; BUN 7; Creat 0.91; Hemoglobin 14.4; Platelets 273; Potassium 4.5; Sodium 138  Recent Lipid Panel    Component Value Date/Time   CHOL 162 12/15/2022 1414   CHOL 156 09/27/2021 1111   TRIG 149 12/15/2022 1414   HDL 46 12/15/2022 1414  HDL 46 09/27/2021 1111   CHOLHDL 3.5 12/15/2022 1414   VLDL 38.2 03/17/2022 1039   LDLCALC 91 12/15/2022 1414   LDLDIRECT 87.0 06/26/2019 0943    Risk Assessment/Calculations:  {Does this patient have ATRIAL FIBRILLATION?:[phone]}  Home Medications   No outpatient medications have been marked as taking for the 12/19/22 encounter (Appointment) with Elgie Collard, PA-C.     Review of Systems   ***   All other systems reviewed and are otherwise negative except as noted above.  Physical Exam    VS:  There were no vitals taken for this visit. , BMI There is no height or weight on file to calculate BMI.  Wt Readings from Last 3 Encounters:  12/15/22 240 lb (108.9 kg)  11/14/22 238 lb  (108 kg)  06/09/22 235 lb 8 oz (106.8 kg)     GEN: Well nourished, well developed, in no acute distress. HEENT: normal. Neck: Supple, no JVD, carotid bruits, or masses. Cardiac: ***RRR, no murmurs, rubs, or gallops. No clubbing, cyanosis, edema.  ***Radials/PT 2+ and equal bilaterally.  Respiratory:  ***Respirations regular and unlabored, clear to auscultation bilaterally. GI: Soft, nontender, nondistended. MS: No deformity or atrophy. Skin: Warm and dry, no rash. Neuro:  Strength and sensation are intact. Psych: Normal affect.  Assessment & Plan    OSA Hypertension Bilateral carotid artery stenosis Hyperlipidemia Chest pain, unspecified Palpitations  No BP recorded.  {Refresh Note OR Click here to enter BP  :1}***      Disposition: Follow up {follow up:15908} with Fransico Him, MD or APP.  Signed, Elgie Collard, PA-C 12/17/2022, 2:48 PM [facility]

## 2022-12-19 ENCOUNTER — Encounter: Payer: Self-pay | Admitting: Physician Assistant

## 2022-12-19 ENCOUNTER — Ambulatory Visit: Payer: 59 | Attending: Physician Assistant | Admitting: Physician Assistant

## 2022-12-19 ENCOUNTER — Telehealth: Payer: Self-pay | Admitting: *Deleted

## 2022-12-19 VITALS — BP 150/90 | HR 62 | Ht 72.0 in | Wt 242.0 lb

## 2022-12-19 DIAGNOSIS — I10 Essential (primary) hypertension: Secondary | ICD-10-CM

## 2022-12-19 DIAGNOSIS — I6523 Occlusion and stenosis of bilateral carotid arteries: Secondary | ICD-10-CM

## 2022-12-19 DIAGNOSIS — G4733 Obstructive sleep apnea (adult) (pediatric): Secondary | ICD-10-CM

## 2022-12-19 DIAGNOSIS — R002 Palpitations: Secondary | ICD-10-CM

## 2022-12-19 DIAGNOSIS — E7800 Pure hypercholesterolemia, unspecified: Secondary | ICD-10-CM

## 2022-12-19 DIAGNOSIS — R079 Chest pain, unspecified: Secondary | ICD-10-CM | POA: Diagnosis not present

## 2022-12-19 DIAGNOSIS — Z01818 Encounter for other preprocedural examination: Secondary | ICD-10-CM

## 2022-12-19 NOTE — Telephone Encounter (Signed)
Received fax confirmation

## 2022-12-19 NOTE — Telephone Encounter (Signed)
-----   Message from [name], MD sent at 12/19/2022  4:23 PM EST ----- Go ahead and reorder in lab split night study ----- Message ----- From: Freada Bergeron, [place]: 12/19/2022   4:20 PM EST To: [name], MD; Elgie Collard, PA-C  Reached out to Ivin Booty with [facility] and she states [facility] called the patient on 11/01/22, 11/18/22,12/08/22, and 12/14/22 and he never responded so the order was voided and he has to start the process all over with a in lab sleep study.  ----- Message ----- From: Miguel Aschoff Sent: 12/19/2022   1:14 PM EST To: Freada Bergeron, CMA  Good afternoon and happy new year!  This patient was lost to follow-up for CPAP orders. He had a sleep study looks like last fall and never received CPAP. If we could look into this that would be helpful!  Thanks! Elgie Collard, PA-C

## 2022-12-19 NOTE — Telephone Encounter (Signed)
Surgical clearance form completed and faxed back to ATTN; Kerri at [facility] [phone]) w/ OV notes, lab, and EKG from 12/15/22. Form sent for scanning.

## 2022-12-19 NOTE — Patient Instructions (Signed)
Medication Instructions:  Your physician recommends that you continue on your current medications as directed. Please refer to the Current Medication list given to you today.  *If you need a refill on your cardiac medications before your next appointment, please call your pharmacy*   Lab Work: None ordered  If you have labs (blood work) drawn today and your tests are completely normal, you will receive your results only by: [facility] (if you have MyChart) OR A paper copy in the mail If you have any lab test that is abnormal or we need to change your treatment, we will call you to review the results.   Testing/Procedures: None ordered   Follow-Up: At [facility], you and your health needs are our priority.  As part of our continuing mission to provide you with exceptional heart care, we have created designated Provider Care Teams.  These Care Teams include your primary Cardiologist (physician) and Advanced Practice Providers (APPs -  Physician Assistants and Nurse Practitioners) who all work together to provide you with the care you need, when you need it.  We recommend signing up for the patient portal called "MyChart".  Sign up information is provided on this After Visit Summary.  MyChart is used to connect with patients for Virtual Visits (Telemedicine).  Patients are able to view lab/test results, encounter notes, upcoming appointments, etc.  Non-urgent messages can be sent to your provider as well.   To learn more about what you can do with MyChart, go to [email or web address].    Your next appointment:   6 month(s)  The format for your next appointment:   In Person  Provider:   Fransico Him, MD     Other Instructions   Important Information About Sugar

## 2022-12-19 NOTE — Telephone Encounter (Signed)
Reached out to [place] with [facility] and she states [facility] called the patient on 11/01/22, 11/18/22,12/08/22, and 12/14/22 and he never responded so the order was voided and he has to start the process all over with a in lab sleep study.

## 2022-12-20 NOTE — Addendum Note (Signed)
Addended byDamita Dunnings D on: 12/20/2022 04:18 PM   Modules accepted: Orders

## 2022-12-20 NOTE — Telephone Encounter (Signed)
Received call from [facility]- they are needing to know when Pt needs to stop Plavix- per ortho- cardiology is requiring this from PCP. Form pulled from scan batch. Please advise.

## 2022-12-22 NOTE — Telephone Encounter (Signed)
Received fax confirmation

## 2022-12-22 NOTE — Telephone Encounter (Signed)
Per PCP- hold aspirin for 7 days and Plavix for 5 days prior to surgery. Restart ASAP. Form updated and faxed back to [facility].

## 2022-12-26 ENCOUNTER — Encounter: Payer: Self-pay | Admitting: Vascular Surgery

## 2022-12-26 ENCOUNTER — Ambulatory Visit: Payer: 59 | Admitting: Vascular Surgery

## 2022-12-26 ENCOUNTER — Ambulatory Visit (HOSPITAL_COMMUNITY)
Admission: RE | Admit: 2022-12-26 | Discharge: 2022-12-26 | Disposition: A | Discharge status: Home / Self Care | Payer: 59 | Source: Ambulatory Visit | Attending: Vascular Surgery | Admitting: Vascular Surgery

## 2022-12-26 VITALS — BP 116/62 | HR 66 | Temp 98.4°F | Resp 16 | Ht 72.0 in | Wt 239.0 lb

## 2022-12-26 DIAGNOSIS — I6523 Occlusion and stenosis of bilateral carotid arteries: Secondary | ICD-10-CM

## 2022-12-26 NOTE — Progress Notes (Signed)
Patient name: Mike Shepard MRN: [id number] DOB: Nov 08, 1966 Sex: male  REASON FOR VISIT: 1 year follow-up for surveillance carotid artery disease   HPI: Mike Shepard is a 57 y.o. male that presents for 1 year interval follow-up after right carotid endarterectomy on 06/02/2019 for an asymptomatic high-grade stenosis.  At the time of his carotid endarterectomy I had a thump signal in the ICA at completion as also noted on preoperative duplex.  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  His right ICA later had a silent occlusion on subsequent follow-up.  Today he reports no issues over the last 1 year.  He's had no neurologic events.  He is working for the city of [place].  He does take aspirin plavix statin daily.  He needs clearance for upcoming hip surgery as well.      Past Medical History:  Diagnosis Date   Aortic atherosclerosis ([place])    Carotid artery stenosis    s/p RCEA now with occluded RICA and 9-32% LICA stenosis by dopplers 10/2020   Chronic back pain    see surgeries    GERD (gastroesophageal reflux disease)    H/O: gout    Hyperlipidemia    Hypertension    PPD positive    Positive PPD, remotely ~2000, s/p Rx     Pre-diabetes    Primary osteoarthritis of right hip    end stage   Stroke [facility])    " mild"   Wears dentures     Past Surgical History:  Procedure Laterality Date   BACK SURGERY     2007-2013 (low back)   ENDARTERECTOMY Right 06/02/2019   Procedure: ENDARTERECTOMY CAROTID RIGHT;  Surgeon: Marty Heck, MD;  Location: [place];  Service: Vascular;  Laterality: Right;   INTRAOPERATIVE ARTERIOGRAM  06/02/2019   Procedure: Right carotid and cerebral angiogram;  Surgeon: Marty Heck, MD;  [facility];  Service: Vascular;;   MULTIPLE TOOTH EXTRACTIONS      NECK SURGERY     2010   OPERATIVE ULTRASOUND Right 06/02/2019   Procedure: Operative Ultrasound;  Surgeon: Marty Heck, MD;  Location: [place];  Service: Vascular;  Laterality: Right;    Family History  Problem Relation Age of Onset   Hypertension Mother        M,F,Sis, bro   Diabetes Father 34       CAD, died of MI   Diabetes Sister    Hypertension Sister    Diabetes Brother    Hypertension Brother    Prostate cancer Paternal Grandfather        dx ~ 35 y/o   Colon cancer Paternal Uncle    Anesthesia problems Neg Hx    CAD Neg Hx    Stroke Neg Hx    Stomach cancer Neg Hx    Esophageal cancer Neg Hx    Pancreatic cancer Neg Hx    Adrenal disorder Neg Hx     SOCIAL HISTORY: Social History   Tobacco Use   Smoking status: Former    Types: Cigarettes    Quit date: 01/27/2010    Years since quitting: 12.9   Smokeless tobacco: Current    Types: Chew   Tobacco comments:    Quit tobacco, used to smoke 1 ppd ~ 24 years, quit ~ 2014    Dips, rec  to see dentist!  Substance Use Topics   Alcohol use: Never    No Known Allergies  Current Outpatient Medications  Medication Sig Dispense Refill   amLODipine (NORVASC) 10 MG tablet Take 1 tablet (10 mg total) by mouth daily. 90 tablet 1   aspirin EC 81 MG EC tablet Take 1 tablet (81 mg total) by mouth daily.     atorvastatin (LIPITOR) 80 MG tablet Take 1 tablet (80 mg total) by mouth daily. 90 tablet 1   buprenorphine (SUBUTEX) 2 MG SUBL SL tablet Place 1 tablet (2 mg total) under the tongue daily.     carvedilol (COREG) 25 MG tablet Take 1 tablet (25 mg total) by mouth 2 (two) times daily with a meal. 180 tablet 0   clopidogrel (PLAVIX) 75 MG tablet Take 1 tablet by mouth once daily 90 tablet 0   ezetimibe (ZETIA) 10 MG tablet Take 1 tablet (10 mg total) by mouth daily. 90 tablet 3   hydrALAZINE (APRESOLINE) 100 MG tablet TAKE 1 TABLET BY MOUTH THREE TIMES DAILY 270 tablet 3   Multiple Vitamin (MULTIVITAMIN WITH MINERALS)  TABS Take 1 tablet by mouth daily.     valsartan (DIOVAN) 320 MG tablet Take 1 tablet (320 mg total) by mouth daily. 90 tablet 3   No current facility-administered medications for this visit.    REVIEW OF SYSTEMS:  '[X]'$  denotes positive finding, '[ ]'$  denotes negative finding Cardiac  Comments:  Chest pain or chest pressure:    Shortness of breath upon exertion:    Short of breath when lying flat:    Irregular heart rhythm:        Vascular    Pain in calf, thigh, or hip brought on by ambulation:    Pain in feet at night that wakes you up from your sleep:     Blood clot in your veins:    Leg swelling:         Pulmonary    Oxygen at home:    Productive cough:     Wheezing:         Neurologic    Sudden weakness in arms or legs:     Sudden numbness in arms or legs:     Sudden onset of difficulty speaking or slurred speech:    Temporary loss of vision in one eye:     Problems with dizziness:         Gastrointestinal    Blood in stool:     Vomited blood:         Genitourinary    Burning when urinating:     Blood in urine:        Psychiatric    Major depression:         Hematologic    Bleeding problems:    Problems with blood clotting too easily:        Skin    Rashes or ulcers:        Constitutional    Fever or chills:      PHYSICAL EXAM: Vitals:   12/26/22 0821 12/26/22 0825  BP: 136/71 116/62  Pulse: 66 66  Resp: 16   Temp: 98.4 F (36.9 C)   TempSrc: Temporal   SpO2: 95%   Weight: 239 lb (108.4 kg)   Height: 6' (1.829 m)     GENERAL: The patient is a well-nourished male, in no acute distress. The vital signs are documented above. CARDIAC: There is a regular rate and rhythm.  VASCULAR:  Right neck incision well healed CN II-XII grossly intact No other appreciable neurologic deficits   DATA:   Carotid duplex today shows right ICA is occluded as previously demonstrated and left ICA has minimal 1-39% stenosis.  Assessment/Plan:  57 year old male  that presents for ongoing surveillance after right carotid endarterectomy on 06/02/2019 for an asymptomatic high-grade stenosis.  As noted above his right ICA occluded several years ago on subsequent follow-up likely due to tandem intracranial disease with limited outflow.  As previously noted at the time of his carotid endarterectomy we had a thumping signal in the ICA that was noted on preoperative duplex and an intraoperative angiogram was obtained that showed a patent carotid endarterectomy site and a patent ICA up to the skull base but he had multiple tandem high-grade intracranial lesions distally.   This was a silent occlusion and again discussed no indication for revascularization with occluded ICA at this time.  We are following his contralateral disease.  This remains minimal at 1 to 39% today.  Discussed continue aspirin plavix statin for risk reduction and I will see him again in 1 year with carotid duplex.  He is cleared to proceed with hip surgery from my standpoint.  Okay to hold Plavix for surgery if needed.  Marty Heck, MD [facility] Office: [phone]

## 2023-01-05 ENCOUNTER — Other Ambulatory Visit: Payer: Self-pay | Admitting: Cardiology

## 2023-01-08 ENCOUNTER — Other Ambulatory Visit: Payer: Self-pay

## 2023-01-09 ENCOUNTER — Other Ambulatory Visit: Payer: Self-pay

## 2023-01-09 MED ORDER — CARVEDILOL 25 MG PO TABS: 25.0000 mg | ORAL_TABLET | Freq: Two times a day (BID) | ORAL | 3 refills | Status: DC

## 2023-01-09 MED ORDER — AMLODIPINE BESYLATE 10 MG PO TABS: 10.0000 mg | ORAL_TABLET | Freq: Every day | ORAL | 3 refills | Status: DC

## 2023-02-02 ENCOUNTER — Other Ambulatory Visit: Payer: 59

## 2023-02-02 NOTE — Progress Notes (Signed)
Sent message, via epic in basket, requesting orders in epic from surgeon.  

## 2023-02-03 ENCOUNTER — Ambulatory Visit: Payer: Self-pay | Admitting: Student

## 2023-02-06 NOTE — Progress Notes (Addendum)
Anesthesia Review:  PCP: Kathlene November clearance 12/15/22 on chart  Cardiologist : 12/19/22 Raeford Razor note on chart  Vascular- Dr Monica Martinez LOV 12/26/22  Chest x-ray : CT Chest- 09/04/22  EKG : 12/19/22  Ct [place]- 2022 Echo : Stress test: 2022  Carotids- 08/02/21  Cardiac Cath :  Activity level: can do a flight of stairs without difficutly  Sleep Study/ CPAP : has sleep apnea does not have machine yet per pt  Fasting Blood Sugar :      / Checks Blood Sugar -- times a day:   Blood Thinner/ Instructions /Last Dose: ASA / Instructions/ Last Dose :    ASA and Plavix- stop 5 days prior to surgery per pt l  Last dose on 02/17/23 per pt   Prediabetes- does not check glucose at home  On no meds  Hgba1c- 02/09/23- 5.5   Blood pressure was 170/97 at preop appt.  PT admitted to being nervous about surgery.  PT denies any chest pain, shortness of breath , dizziness or headache or blurred vision.  PT monitors blood pressure at home .  PT aware to keep a check on blood pressure prior to surgery and to notify MD if B/P remains elevated.

## 2023-02-06 NOTE — Patient Instructions (Signed)
SURGICAL WAITING ROOM VISITATION  Patients having surgery or a procedure may have no more than 2 support people in the waiting area - these visitors may rotate.    Children under the age of 65 must have an adult with them who is not the patient.  Due to an increase in RSV and influenza rates and associated hospitalizations, children ages 64 and under may not visit patients in [place].  If the patient needs to stay at the hospital during part of their recovery, the visitor guidelines for inpatient rooms apply. Pre-op nurse will coordinate an appropriate time for 1 support person to accompany patient in pre-op.  This support person may not rotate.    Please refer to the [facility] website for the visitor guidelines for Inpatients (after your surgery is over and you are in a regular room).       Your procedure is scheduled on:  02/22/23    Report to [facility] Main Entrance    Report to admitting at  [place] AM   Call this number if you have problems the morning of surgery [phone]   Do not eat food :After Midnight.   After Midnight you may have the following liquids until __ 0430____ AM DAY OF SURGERY  Water Non-Citrus Juices (without pulp, NO RED-Apple, White grape, White cranberry) Black Coffee (NO MILK/CREAM OR CREAMERS, sugar ok)  Clear Tea (NO MILK/CREAM OR CREAMERS, sugar ok) regular and decaf                             Plain Jell-O (NO RED)                                           Fruit ices (not with fruit pulp, NO RED)                                     Popsicles (NO RED)                                                               Sports drinks like Gatorade (NO RED)                     The day of surgery:   Drink ONE (1) Pre-Surgery Clear Ensure or G2 at   0430AM( have completed by )  the morning of surgery. Drink in one sitting. Do not sip.  This drink was given to you during your hospital  pre-op appointment visit. Nothing else to  drink after completing the  Pre-Surgery Clear Ensure or G2.          If you have questions, please contact your surgeon's office.        Oral Hygiene is also important to reduce your risk of infection.                                    Remember - BRUSH YOUR TEETH THE MORNING OF  SURGERY WITH YOUR REGULAR TOOTHPASTE  DENTURES WILL BE REMOVED PRIOR TO SURGERY PLEASE DO NOT APPLY "Poly grip" OR ADHESIVES!!!   Do NOT smoke after Midnight   Take these medicines the morning of surgery with A SIP OF WATER:   DO NOT TAKE ANY ORAL DIABETIC MEDICATIONS DAY OF YOUR SURGERY  Bring CPAP mask and tubing day of surgery.                              You may not have any metal on your body including hair pins, jewelry, and body piercing             Do not wear make-up, lotions, powders, perfumes/cologne, or deodorant  Do not wear nail polish including gel and S&S, artificial/acrylic nails, or any other type of covering on natural nails including finger and toenails. If you have artificial nails, gel coating, etc. that needs to be removed by a nail salon please have this removed prior to surgery or surgery may need to be canceled/ delayed if the surgeon/ anesthesia feels like they are unable to be safely monitored.   Do not shave  48 hours prior to surgery.               Men may shave face and neck.   Do not bring valuables to the hospital. [place].   Contacts, glasses, dentures or bridgework may not be worn into surgery.   Bring small overnight bag day of surgery.   DO NOT [place]. PHARMACY WILL DISPENSE MEDICATIONS LISTED ON YOUR MEDICATION LIST TO YOU DURING YOUR ADMISSION [place]!    Patients discharged on the day of surgery will not be allowed to drive home.  Someone NEEDS to stay with you for the first 24 hours after anesthesia.   Special Instructions: Bring a copy of your healthcare power  of attorney and living will documents the day of surgery if you haven't scanned them before.              Please read over the following fact sheets you were given: IF [place] [phone]   If you received a COVID test during your pre-op visit  it is requested that you wear a mask when out in public, stay away from anyone that may not be feeling well and notify your surgeon if you develop symptoms. If you test positive for Covid or have been in contact with anyone that has tested positive in the last 10 days please notify you surgeon.    [place] - Preparing for Surgery Before surgery, you can play an important role.  Because skin is not sterile, your skin needs to be as free of germs as possible.  You can reduce the number of germs on your skin by washing with CHG (chlorahexidine gluconate) soap before surgery.  CHG is an antiseptic cleaner which kills germs and bonds with the skin to continue killing germs even after washing. Please DO NOT use if you have an allergy to CHG or antibacterial soaps.  If your skin becomes reddened/irritated stop using the CHG and inform your nurse when you arrive at Short Stay. Do not shave (including legs and underarms) for at least 48 hours prior to the first CHG shower.  You may shave your face/neck. Please follow these instructions carefully:  1.  Shower with CHG Soap the night before surgery and the  morning of Surgery.  2.  If you choose to wash your hair, wash your hair first as usual with your  normal  shampoo.  3.  After you shampoo, rinse your hair and body thoroughly to remove the  shampoo.                           4.  Use CHG as you would any other liquid soap.  You can apply chg directly  to the skin and wash                       Gently with a scrungie or clean washcloth.  5.  Apply the CHG Soap to your body ONLY FROM THE NECK DOWN.   Do not use on face/ open                           Wound or  open sores. Avoid contact with eyes, ears mouth and genitals (private parts).                       Wash face,  Genitals (private parts) with your normal soap.             6.  Wash thoroughly, paying special attention to the area where your surgery  will be performed.  7.  Thoroughly rinse your body with warm water from the neck down.  8.  DO NOT shower/wash with your normal soap after using and rinsing off  the CHG Soap.                9.  Pat yourself dry with a clean towel.            10.  Wear clean pajamas.            11.  Place clean sheets on your bed the night of your first shower and do not  sleep with pets. Day of Surgery : Do not apply any lotions/deodorants the morning of surgery.  Please wear clean clothes to the hospital/surgery center.  FAILURE TO FOLLOW THESE INSTRUCTIONS MAY RESULT IN THE CANCELLATION OF YOUR SURGERY PATIENT SIGNATURE_________________________________  NURSE SIGNATURE__________________________________  ________________________________________________________________________

## 2023-02-09 ENCOUNTER — Encounter (HOSPITAL_COMMUNITY)
Admission: RE | Admit: 2023-02-09 | Discharge: 2023-02-09 | Disposition: A | Discharge status: Home / Self Care | Payer: 59 | Source: Ambulatory Visit | Attending: Orthopedic Surgery | Admitting: Orthopedic Surgery

## 2023-02-09 ENCOUNTER — Encounter (HOSPITAL_COMMUNITY): Payer: Self-pay

## 2023-02-09 ENCOUNTER — Other Ambulatory Visit: Payer: Self-pay

## 2023-02-09 VITALS — BP 170/97 | HR 75 | Temp 98.7°F | Resp 16 | Ht 69.0 in | Wt 236.0 lb

## 2023-02-09 DIAGNOSIS — Z01812 Encounter for preprocedural laboratory examination: Secondary | ICD-10-CM | POA: Insufficient documentation

## 2023-02-09 DIAGNOSIS — Z01818 Encounter for other preprocedural examination: Secondary | ICD-10-CM

## 2023-02-09 HISTORY — DX: Sleep apnea, unspecified: G47.30

## 2023-02-09 LAB — CBC
HCT: 44.5 % (ref 39.0–52.0)
Hemoglobin: 14.8 g/dL (ref 13.0–17.0)
MCH: 28.5 pg (ref 26.0–34.0)
MCHC: 33.3 g/dL (ref 30.0–36.0)
MCV: 85.7 fL (ref 80.0–100.0)
Platelets: 290 10*3/uL (ref 150–400)
RBC: 5.19 MIL/uL (ref 4.22–5.81)
RDW: 12.6 % (ref 11.5–15.5)
WBC: 9 10*3/uL (ref 4.0–10.5)
nRBC: 0 % (ref 0.0–0.2)

## 2023-02-09 LAB — GLUCOSE, CAPILLARY: Glucose-Capillary: 130 mg/dL — ABNORMAL HIGH (ref 70–99)

## 2023-02-09 LAB — BASIC METABOLIC PANEL
Anion gap: 10 (ref 5–15)
BUN: 9 mg/dL (ref 6–20)
CO2: 22 mmol/L (ref 22–32)
Calcium: 9.5 mg/dL (ref 8.9–10.3)
Chloride: 106 mmol/L (ref 98–111)
Creatinine, Ser: 1.01 mg/dL (ref 0.61–1.24)
GFR, Estimated: >60 mL/min (ref >60)
Glucose, Bld: 135 mg/dL — ABNORMAL HIGH (ref 70–99)
Potassium: 3.7 mmol/L (ref 3.5–5.1)
Sodium: 138 mmol/L (ref 135–145)

## 2023-02-09 LAB — SURGICAL PCR SCREEN
MRSA, PCR: NEGATIVE
Staphylococcus aureus: NEGATIVE

## 2023-02-09 LAB — HEMOGLOBIN A1C
Hgb A1c MFr Bld: 5.5 % (ref 4.8–5.6)
Mean Plasma Glucose: 111.15 mg/dL

## 2023-02-13 ENCOUNTER — Ambulatory Visit: Payer: Self-pay | Admitting: Student

## 2023-02-13 NOTE — H&P (View-Only) (Signed)
TOTAL HIP ADMISSION H&P  Patient is admitted for right total hip arthroplasty.  Subjective:  Chief Complaint: right hip pain  HPI: Mike Shepard, 57 y.o. male, has a history of pain and functional disability in the right hip(s) due to arthritis and patient has failed non-surgical conservative treatments for greater than 12 weeks to include NSAID's and/or analgesics, corticosteriod injections, flexibility and strengthening excercises, and activity modification.  Onset of symptoms was gradual starting 7 years ago with rapidlly worsening course since that time.The patient noted no past surgery on the right hip(s).  Patient currently rates pain in the right hip at 10 out of 10 with activity. Patient has night pain, worsening of pain with activity and weight bearing, trendelenberg gait, pain that interfers with activities of daily living, and pain with passive range of motion. Patient has evidence of subchondral cysts, subchondral sclerosis, periarticular osteophytes, and joint space narrowing by imaging studies. This condition presents safety issues increasing the risk of falls.  There is no current active infection.  Patient Active Problem List   Diagnosis Date Noted   OSA (obstructive sleep apnea) 03/17/2022   Aortic atherosclerosis (HCC)    Carotid artery stenosis    Asymptomatic stenosis of right carotid artery without infarction 06/02/2019   Carotid stenosis 05/20/2019   H/O: CVA  per MRI 05/14/2018   DJD (degenerative joint disease) 04/21/2016   PCP NOTES >>>>> 11/06/2015   Diabetes mellitus without complication ([place]) 123XX123   Abnormal TSH 12/28/2013   Annual physical exam 07/19/2012   Back pain 07/19/2012   GOUT 11/15/2010   Hyperlipidemia 08/29/2007   Essential hypertension 06/27/2007   GERD 06/27/2007   Past Medical History:  Diagnosis Date   Aortic atherosclerosis ([place])    Carotid artery stenosis    s/p RCEA now with occluded RICA and 123456 LICA stenosis by dopplers  10/2020   Chronic back pain    see surgeries    GERD (gastroesophageal reflux disease)    H/O: gout    Hyperlipidemia    Hypertension    PPD positive    Positive PPD, remotely ~2000, s/p Rx     Pre-diabetes    Primary osteoarthritis of right hip    end stage   Sleep apnea    no machine yet   Stroke [facility])    " mild"   Wears dentures     Past Surgical History:  Procedure Laterality Date   BACK SURGERY     2007-2013 (low back)   ENDARTERECTOMY Right 06/02/2019   Procedure: ENDARTERECTOMY CAROTID RIGHT;  Surgeon: Marty Heck, MD;  Location: [place];  Service: Vascular;  Laterality: Right;   INTRAOPERATIVE ARTERIOGRAM  06/02/2019   Procedure: Right carotid and cerebral angiogram;  Surgeon: Marty Heck, MD;  Location: [place];  Service: Vascular;;   MULTIPLE TOOTH EXTRACTIONS     NECK SURGERY     2010   OPERATIVE ULTRASOUND Right 06/02/2019   Procedure: Operative Ultrasound;  Surgeon: Marty Heck, MD;  Location: [facility] OR;  Service: Vascular;  Laterality: Right;    Current Outpatient Medications  Medication Sig Dispense Refill Last Dose   amLODipine (NORVASC) 10 MG tablet Take 1 tablet (10 mg total) by mouth daily. 90 tablet 3    aspirin EC 81 MG EC tablet Take 1 tablet (81 mg total) by mouth daily.      atorvastatin (LIPITOR) 80 MG tablet Take 1 tablet (80 mg total) by mouth daily. 90 tablet 1    buprenorphine (SUBUTEX) 2  MG SUBL SL tablet Place 1 tablet (2 mg total) under the tongue daily. (Patient not taking: Reported on 02/08/2023)      buprenorphine (SUBUTEX) 8 MG SUBL SL tablet Place 8 mg under the tongue daily.      carvedilol (COREG) 25 MG tablet Take 1 tablet (25 mg total) by mouth 2 (two) times daily with a meal. 180 tablet 3    clopidogrel (PLAVIX) 75 MG tablet Take 1 tablet by mouth once daily 90 tablet 0    ezetimibe (ZETIA) 10 MG tablet Take 1 tablet (10 mg total) by mouth daily. 90 tablet 3    fluticasone (FLONASE) 50 MCG/ACT nasal spray Place 1 spray  into both nostrils daily as needed for allergies or rhinitis.      hydrALAZINE (APRESOLINE) 100 MG tablet TAKE 1 TABLET BY MOUTH THREE TIMES DAILY 270 tablet 3    Multiple Vitamin (MULTIVITAMIN WITH MINERALS) TABS Take 1 tablet by mouth daily.      valsartan (DIOVAN) 320 MG tablet Take 1 tablet (320 mg total) by mouth daily. 90 tablet 3    No current facility-administered medications for this visit.   No Known Allergies  Social History   Tobacco Use   Smoking status: Former    Types: Cigarettes    Quit date: 01/27/2010    Years since quitting: 13.0   Smokeless tobacco: Current    Types: Snuff   Tobacco comments:    Quit tobacco, used to smoke 1 ppd ~ 24 years, quit ~ 2014    Dips, rec to see dentist!  Substance Use Topics   Alcohol use: Never    Family History  Problem Relation Age of Onset   Hypertension Mother        M,F,Sis, bro   Diabetes Father 70       CAD, died of MI   Diabetes Sister    Hypertension Sister    Diabetes Brother    Hypertension Brother    Prostate cancer Paternal Grandfather        dx ~ 81 y/o   Colon cancer Paternal Uncle    Anesthesia problems Neg Hx    CAD Neg Hx    Stroke Neg Hx    Stomach cancer Neg Hx    Esophageal cancer Neg Hx    Pancreatic cancer Neg Hx    Adrenal disorder Neg Hx      Review of Systems  Musculoskeletal:  Positive for arthralgias, back pain and gait problem.  All other systems reviewed and are negative.   Objective:  Physical Exam Constitutional:      Appearance: Normal appearance.  HENT:     Head: Normocephalic and atraumatic.     Nose: Nose normal.     Mouth/Throat:     Mouth: Mucous membranes are moist.     Pharynx: Oropharynx is clear.  Eyes:     Conjunctiva/sclera: Conjunctivae normal.  Cardiovascular:     Rate and Rhythm: Normal rate and regular rhythm.     Pulses: Normal pulses.     Heart sounds: Normal heart sounds.  Pulmonary:     Effort: Pulmonary effort is normal.     Breath sounds: Normal  breath sounds.  Abdominal:     General: Abdomen is flat.     Palpations: Abdomen is soft.  Genitourinary:    Comments: deferred Musculoskeletal:     Cervical back: Normal range of motion and neck supple.     Comments: Examination of the right hip reveals no skin  wounds or lesions. There is no trochanteric tenderness to palpation. He has severely restricted range of motion of the right hip. He has a 15 degree flexion contracture, and I can flex him up to 90 degrees. He internally rotates 5 degrees, and externally rotates 15 degrees. Pain with terminal flexion and rotation. Pain in the position impingement.  Distally, there is no focal motor or sensory deficit. He has palpable pedal pulses.  Skin:    General: Skin is warm and dry.     Capillary Refill: Capillary refill takes less than 2 seconds.  Neurological:     General: No focal deficit present.     Mental Status: He is alert and oriented to person, place, and time.  Psychiatric:        Mood and Affect: Mood normal.        Behavior: Behavior normal.        Thought Content: Thought content normal.        Judgment: Judgment normal.     Vital signs in last 24 hours: '@VSRANGES'$ @  Labs:   Estimated body mass index is 34.85 kg/m as calculated from the following:   Height as of 02/09/23: '5\' 9"'$  (1.753 m).   Weight as of 02/09/23: 107 kg.   Imaging Review Plain radiographs demonstrate severe degenerative joint disease of the right hip(s). The bone quality appears to be adequate for age and reported activity level.      Assessment/Plan:  End stage arthritis, right hip(s)  The patient history, physical examination, clinical judgement of the provider and imaging studies are consistent with end stage degenerative joint disease of the right hip(s) and total hip arthroplasty is deemed medically necessary. The treatment options including medical management, injection therapy, arthroscopy and arthroplasty were discussed at length. The  risks and benefits of total hip arthroplasty were presented and reviewed. The risks due to aseptic loosening, infection, stiffness, dislocation/subluxation,  thromboembolic complications and other imponderables were discussed.  The patient acknowledged the explanation, agreed to proceed with the plan and consent was signed. Patient is being admitted for inpatient treatment for surgery, pain control, PT, OT, prophylactic antibiotics, VTE prophylaxis, progressive ambulation and ADL's and discharge planning.The patient is planning to be discharged home after an overnight stay with HEP.   Therapy Plans: HEP.  Disposition: Home with wife and daughter.  Planned DVT Prophylaxis: aspirin '81mg'$  BID and plavix per baseline.  DME needed: walker PCP: Cleared Cardiology: Cleared. Hold aspirin for 7 days and plavix 5 days prior to surgery.  TXA: IV Allergies: NDKA.  Anesthesia Concerns: None.  BMI: 32.7 Last HgbA1c: 5.4 Other: - Carotid artery disease, plavix '75mg'$  and aspirin '81mg'$  daily.  - Chronic low back pain, hx lumbar surgery. - New OSA, does not have CPAP yet.  - IA injection 05/31/22.  - Hydrocodone, zofran, methocarbamol.  - 02/09/23: Hgb 14.8, Cr.  1.01, K+ 3.7    Patient's anticipated LOS is less than 2 midnights, meeting these requirements: - Younger than 31 - Lives within 1 hour of care - Has a competent adult at home to recover with post-op recover - NO history of  - Chronic pain requiring opiods  - Diabetes  - Coronary Artery Disease  - Heart failure  - Heart attack  - Stroke  - DVT/VTE  - Cardiac arrhythmia  - Respiratory Failure/COPD  - Renal failure  - Anemia  - Advanced Liver disease

## 2023-02-13 NOTE — Progress Notes (Addendum)
Anesthesia Chart Review   Case: [id number] Date/Time: 02/22/23 0715   Procedure: TOTAL HIP ARTHROPLASTY ANTERIOR APPROACH (Right: Hip) - 150   Anesthesia type: Spinal   Pre-op diagnosis: Right hip osteoarthritis   Location: [facility] ROOM 08 / [facility]   Surgeons: Rod Can, MD       DISCUSSION:57 y.o. former smoker with h/o HTN, sleep apnea, Stroke, s/p right carotid endarterectomy 2020, right hip OA scheduled for above procedure 02/22/2023 with Dr. Rod Can.  Pt last seen by vascular surgery 12/26/2022. Per OV note, "He is cleared to proceed with hip surgery from my standpoint. Okay to hold Plavix for surgery if needed."  Pt last seen by cardiology 12/19/2022. Per OV note, "Mr. Parrack's perioperative risk of a major cardiac event is 0.9% according to the Revised Cardiac Risk Index (RCRI).  Therefore, he is at low risk for perioperative complications.   His functional capacity is good at 5.07 METs according to the [facility] Activity Status Index (DASI). Recommendations: According to ACC/AHA guidelines, no further cardiovascular testing needed.  The patient may proceed to surgery at acceptable risk.   Antiplatelet and/or Anticoagulation Recommendations: Aspirin can be held for 7 days prior to his surgery.  Please resume Aspirin post operatively when it is felt to be safe from a bleeding standpoint.  Clopidogrel (Plavix) can be held for 5 days prior to his surgery and resumed as soon as possible post op. Since plavix/asa prescribed by PCP and vascular would make sure holding medications okay with those providers as well."  Pt reports last dose of Plavix AM of 02/16/23.   Anticipate pt can proceed with planned procedure barring acute status change.   VS: BP (!) 170/97   Pulse 75   Temp 37.1 C (Oral)   Resp 16   Ht '5\' 9"'$  (1.753 m)   Wt 107 kg   SpO2 98%   BMI 34.85 kg/m   PROVIDERS: Colon Branch, MD   LABS: Labs reviewed: Acceptable for surgery. (all labs ordered are listed, but only  abnormal results are displayed)  Labs Reviewed  BASIC METABOLIC PANEL - Abnormal; Notable for the following components:      Result Value   Glucose, Bld 135 (*)    All other components within normal limits  GLUCOSE, CAPILLARY - Abnormal; Notable for the following components:   Glucose-Capillary 130 (*)    All other components within normal limits  SURGICAL PCR SCREEN  HEMOGLOBIN A1C  CBC     IMAGES:   EKG:   CV: Myocardial Perfusion 03/11/2021 The left ventricular ejection fraction is normal (55-65%). Nuclear stress EF: 65%. There was no ST segment deviation noted during stress. The study is normal. This is a low risk study.   Past Medical History:  Diagnosis Date   Aortic atherosclerosis ([place])    Carotid artery stenosis    s/p RCEA now with occluded RICA and 123456 LICA stenosis by dopplers 10/2020   Chronic back pain    see surgeries    GERD (gastroesophageal reflux disease)    H/O: gout    Hyperlipidemia    Hypertension    PPD positive    Positive PPD, remotely ~2000, s/p Rx     Pre-diabetes    Primary osteoarthritis of right hip    end stage   Sleep apnea    no machine yet   Stroke [facility])    " mild"   Wears dentures     Past Surgical History:  Procedure Laterality Date  BACK SURGERY     2007-2013 (low back)   ENDARTERECTOMY Right 06/02/2019   Procedure: ENDARTERECTOMY CAROTID RIGHT;  Surgeon: Marty Heck, MD;  Location: [place];  Service: Vascular;  Laterality: Right;   INTRAOPERATIVE ARTERIOGRAM  06/02/2019   Procedure: Right carotid and cerebral angiogram;  Surgeon: Marty Heck, MD;  Location: [place];  Service: Vascular;;   MULTIPLE TOOTH EXTRACTIONS     NECK SURGERY     2010   OPERATIVE ULTRASOUND Right 06/02/2019   Procedure: Operative Ultrasound;  Surgeon: Marty Heck, MD;  Location: [facility] OR;  Service: Vascular;  Laterality: Right;    MEDICATIONS:  amLODipine (NORVASC) 10 MG tablet   aspirin EC 81 MG EC tablet    atorvastatin (LIPITOR) 80 MG tablet   buprenorphine (SUBUTEX) 2 MG SUBL SL tablet   buprenorphine (SUBUTEX) 8 MG SUBL SL tablet   carvedilol (COREG) 25 MG tablet   clopidogrel (PLAVIX) 75 MG tablet   ezetimibe (ZETIA) 10 MG tablet   fluticasone (FLONASE) 50 MCG/ACT nasal spray   hydrALAZINE (APRESOLINE) 100 MG tablet   Multiple Vitamin (MULTIVITAMIN WITH MINERALS) TABS   valsartan (DIOVAN) 320 MG tablet   No current facility-administered medications for this encounter.    Konrad Felix Ward, PA-C [facility] [phone]

## 2023-02-13 NOTE — H&P (Signed)
TOTAL HIP ADMISSION H&P  Patient is admitted for right total hip arthroplasty.  Subjective:  Chief Complaint: right hip pain  HPI: Mike Shepard, 57 y.o. male, has a history of pain and functional disability in the right hip(s) due to arthritis and patient has failed non-surgical conservative treatments for greater than 12 weeks to include NSAID's and/or analgesics, corticosteriod injections, flexibility and strengthening excercises, and activity modification.  Onset of symptoms was gradual starting 7 years ago with rapidlly worsening course since that time.The patient noted no past surgery on the right hip(s).  Patient currently rates pain in the right hip at 10 out of 10 with activity. Patient has night pain, worsening of pain with activity and weight bearing, trendelenberg gait, pain that interfers with activities of daily living, and pain with passive range of motion. Patient has evidence of subchondral cysts, subchondral sclerosis, periarticular osteophytes, and joint space narrowing by imaging studies. This condition presents safety issues increasing the risk of falls.  There is no current active infection.  Patient Active Problem List   Diagnosis Date Noted   OSA (obstructive sleep apnea) 03/17/2022   Aortic atherosclerosis (HCC)    Carotid artery stenosis    Asymptomatic stenosis of right carotid artery without infarction 06/02/2019   Carotid stenosis 05/20/2019   H/O: CVA  per MRI 05/14/2018   DJD (degenerative joint disease) 04/21/2016   PCP NOTES >>>>> 11/06/2015   Diabetes mellitus without complication ([place]) 123XX123   Abnormal TSH 12/28/2013   Annual physical exam 07/19/2012   Back pain 07/19/2012   GOUT 11/15/2010   Hyperlipidemia 08/29/2007   Essential hypertension 06/27/2007   GERD 06/27/2007   Past Medical History:  Diagnosis Date   Aortic atherosclerosis ([place])    Carotid artery stenosis    s/p RCEA now with occluded RICA and 123456 LICA stenosis by dopplers  10/2020   Chronic back pain    see surgeries    GERD (gastroesophageal reflux disease)    H/O: gout    Hyperlipidemia    Hypertension    PPD positive    Positive PPD, remotely ~2000, s/p Rx     Pre-diabetes    Primary osteoarthritis of right hip    end stage   Sleep apnea    no machine yet   Stroke [facility])    " mild"   Wears dentures     Past Surgical History:  Procedure Laterality Date   BACK SURGERY     2007-2013 (low back)   ENDARTERECTOMY Right 06/02/2019   Procedure: ENDARTERECTOMY CAROTID RIGHT;  Surgeon: Marty Heck, MD;  Location: [place];  Service: Vascular;  Laterality: Right;   INTRAOPERATIVE ARTERIOGRAM  06/02/2019   Procedure: Right carotid and cerebral angiogram;  Surgeon: Marty Heck, MD;  Location: [place];  Service: Vascular;;   MULTIPLE TOOTH EXTRACTIONS     NECK SURGERY     2010   OPERATIVE ULTRASOUND Right 06/02/2019   Procedure: Operative Ultrasound;  Surgeon: Marty Heck, MD;  Location: [facility] OR;  Service: Vascular;  Laterality: Right;    Current Outpatient Medications  Medication Sig Dispense Refill Last Dose   amLODipine (NORVASC) 10 MG tablet Take 1 tablet (10 mg total) by mouth daily. 90 tablet 3    aspirin EC 81 MG EC tablet Take 1 tablet (81 mg total) by mouth daily.      atorvastatin (LIPITOR) 80 MG tablet Take 1 tablet (80 mg total) by mouth daily. 90 tablet 1    buprenorphine (SUBUTEX) 2  MG SUBL SL tablet Place 1 tablet (2 mg total) under the tongue daily. (Patient not taking: Reported on 02/08/2023)      buprenorphine (SUBUTEX) 8 MG SUBL SL tablet Place 8 mg under the tongue daily.      carvedilol (COREG) 25 MG tablet Take 1 tablet (25 mg total) by mouth 2 (two) times daily with a meal. 180 tablet 3    clopidogrel (PLAVIX) 75 MG tablet Take 1 tablet by mouth once daily 90 tablet 0    ezetimibe (ZETIA) 10 MG tablet Take 1 tablet (10 mg total) by mouth daily. 90 tablet 3    fluticasone (FLONASE) 50 MCG/ACT nasal spray Place 1 spray  into both nostrils daily as needed for allergies or rhinitis.      hydrALAZINE (APRESOLINE) 100 MG tablet TAKE 1 TABLET BY MOUTH THREE TIMES DAILY 270 tablet 3    Multiple Vitamin (MULTIVITAMIN WITH MINERALS) TABS Take 1 tablet by mouth daily.      valsartan (DIOVAN) 320 MG tablet Take 1 tablet (320 mg total) by mouth daily. 90 tablet 3    No current facility-administered medications for this visit.   No Known Allergies  Social History   Tobacco Use   Smoking status: Former    Types: Cigarettes    Quit date: 01/27/2010    Years since quitting: 13.0   Smokeless tobacco: Current    Types: Snuff   Tobacco comments:    Quit tobacco, used to smoke 1 ppd ~ 24 years, quit ~ 2014    Dips, rec to see dentist!  Substance Use Topics   Alcohol use: Never    Family History  Problem Relation Age of Onset   Hypertension Mother        M,F,Sis, bro   Diabetes Father 92       CAD, died of MI   Diabetes Sister    Hypertension Sister    Diabetes Brother    Hypertension Brother    Prostate cancer Paternal Grandfather        dx ~ 47 y/o   Colon cancer Paternal Uncle    Anesthesia problems Neg Hx    CAD Neg Hx    Stroke Neg Hx    Stomach cancer Neg Hx    Esophageal cancer Neg Hx    Pancreatic cancer Neg Hx    Adrenal disorder Neg Hx      Review of Systems  Musculoskeletal:  Positive for arthralgias, back pain and gait problem.  All other systems reviewed and are negative.   Objective:  Physical Exam Constitutional:      Appearance: Normal appearance.  HENT:     Head: Normocephalic and atraumatic.     Nose: Nose normal.     Mouth/Throat:     Mouth: Mucous membranes are moist.     Pharynx: Oropharynx is clear.  Eyes:     Conjunctiva/sclera: Conjunctivae normal.  Cardiovascular:     Rate and Rhythm: Normal rate and regular rhythm.     Pulses: Normal pulses.     Heart sounds: Normal heart sounds.  Pulmonary:     Effort: Pulmonary effort is normal.     Breath sounds: Normal  breath sounds.  Abdominal:     General: Abdomen is flat.     Palpations: Abdomen is soft.  Genitourinary:    Comments: deferred Musculoskeletal:     Cervical back: Normal range of motion and neck supple.     Comments: Examination of the right hip reveals no skin  wounds or lesions. There is no trochanteric tenderness to palpation. He has severely restricted range of motion of the right hip. He has a 15 degree flexion contracture, and I can flex him up to 90 degrees. He internally rotates 5 degrees, and externally rotates 15 degrees. Pain with terminal flexion and rotation. Pain in the position impingement.  Distally, there is no focal motor or sensory deficit. He has palpable pedal pulses.  Skin:    General: Skin is warm and dry.     Capillary Refill: Capillary refill takes less than 2 seconds.  Neurological:     General: No focal deficit present.     Mental Status: He is alert and oriented to person, place, and time.  Psychiatric:        Mood and Affect: Mood normal.        Behavior: Behavior normal.        Thought Content: Thought content normal.        Judgment: Judgment normal.     Vital signs in last 24 hours: '@VSRANGES'$ @  Labs:   Estimated body mass index is 34.85 kg/m as calculated from the following:   Height as of 02/09/23: '5\' 9"'$  (1.753 m).   Weight as of 02/09/23: 107 kg.   Imaging Review Plain radiographs demonstrate severe degenerative joint disease of the right hip(s). The bone quality appears to be adequate for age and reported activity level.      Assessment/Plan:  End stage arthritis, right hip(s)  The patient history, physical examination, clinical judgement of the provider and imaging studies are consistent with end stage degenerative joint disease of the right hip(s) and total hip arthroplasty is deemed medically necessary. The treatment options including medical management, injection therapy, arthroscopy and arthroplasty were discussed at length. The  risks and benefits of total hip arthroplasty were presented and reviewed. The risks due to aseptic loosening, infection, stiffness, dislocation/subluxation,  thromboembolic complications and other imponderables were discussed.  The patient acknowledged the explanation, agreed to proceed with the plan and consent was signed. Patient is being admitted for inpatient treatment for surgery, pain control, PT, OT, prophylactic antibiotics, VTE prophylaxis, progressive ambulation and ADL's and discharge planning.The patient is planning to be discharged home after an overnight stay with HEP.   Therapy Plans: HEP.  Disposition: Home with wife and daughter.  Planned DVT Prophylaxis: aspirin '81mg'$  BID and plavix per baseline.  DME needed: walker PCP: Cleared Cardiology: Cleared. Hold aspirin for 7 days and plavix 5 days prior to surgery.  TXA: IV Allergies: NDKA.  Anesthesia Concerns: None.  BMI: 32.7 Last HgbA1c: 5.4 Other: - Carotid artery disease, plavix '75mg'$  and aspirin '81mg'$  daily.  - Chronic low back pain, hx lumbar surgery. - New OSA, does not have CPAP yet.  - IA injection 05/31/22.  - Hydrocodone, zofran, methocarbamol.  - 02/09/23: Hgb 14.8, Cr.  1.01, K+ 3.7    Patient's anticipated LOS is less than 2 midnights, meeting these requirements: - Younger than 24 - Lives within 1 hour of care - Has a competent adult at home to recover with post-op recover - NO history of  - Chronic pain requiring opiods  - Diabetes  - Coronary Artery Disease  - Heart failure  - Heart attack  - Stroke  - DVT/VTE  - Cardiac arrhythmia  - Respiratory Failure/COPD  - Renal failure  - Anemia  - Advanced Liver disease

## 2023-02-16 NOTE — Anesthesia Preprocedure Evaluation (Signed)
Anesthesia Evaluation  Patient identified by MRN, date of birth, ID band Patient awake    Reviewed: Allergy & Precautions, NPO status , Patient's Chart, lab work & pertinent test results, reviewed documented beta blocker date and time   Airway Mallampati: III  TM Distance: >3 FB Neck ROM: Full    Dental  (+) Dental Advisory Given, Edentulous Upper, Missing   Pulmonary sleep apnea , former smoker   Pulmonary exam normal breath sounds clear to auscultation       Cardiovascular hypertension, Pt. on home beta blockers and Pt. on medications (-) angina + Peripheral Vascular Disease  Normal cardiovascular exam Rhythm:Regular Rate:Normal     Neuro/Psych TIA negative psych ROS   GI/Hepatic Neg liver ROS,GERD  ,,  Endo/Other  diabetes, Well Controlled, Type 2    Renal/GU negative Renal ROS     Musculoskeletal  (+) Arthritis , Osteoarthritis,    Abdominal   Peds  Hematology  (+) Blood dyscrasia (Plavix) Plt 292k   Anesthesia Other Findings   Reproductive/Obstetrics                             Anesthesia Physical Anesthesia Plan  ASA: 3  Anesthesia Plan: General   Post-op Pain Management: Tylenol PO (pre-op)*, Ketamine IV* and Toradol IV (intra-op)*   Induction: Intravenous  PONV Risk Score and Plan: 3 and Midazolam, Dexamethasone and Ondansetron  Airway Management Planned: Oral ETT  Additional Equipment:   Intra-op Plan:   Post-operative Plan: Extubation in OR  Informed Consent: I have reviewed the patients History and Physical, chart, labs and discussed the procedure including the risks, benefits and alternatives for the proposed anesthesia with the patient or authorized representative who has indicated his/her understanding and acceptance.     Dental advisory given  Plan Discussed with: CRNA  Anesthesia Plan Comments: (See PAT note 02/09/23)       Anesthesia Quick  Evaluation

## 2023-02-19 NOTE — Telephone Encounter (Signed)
Prior Authorization for SPLIT NIGHT sent to [facility] via web portal. Tracking Number . Not Covered/Not Approved

## 2023-02-21 ENCOUNTER — Emergency Department (HOSPITAL_COMMUNITY)
Admission: EM | Admit: 2023-02-21 | Discharge: 2023-02-21 | Disposition: A | Discharge status: Home / Self Care | Payer: 59 | Attending: Emergency Medicine | Admitting: Emergency Medicine

## 2023-02-21 ENCOUNTER — Emergency Department (HOSPITAL_COMMUNITY): Payer: 59

## 2023-02-21 ENCOUNTER — Other Ambulatory Visit: Payer: Self-pay

## 2023-02-21 DIAGNOSIS — Z7902 Long term (current) use of antithrombotics/antiplatelets: Secondary | ICD-10-CM | POA: Insufficient documentation

## 2023-02-21 DIAGNOSIS — Z79899 Other long term (current) drug therapy: Secondary | ICD-10-CM | POA: Insufficient documentation

## 2023-02-21 DIAGNOSIS — I2510 Atherosclerotic heart disease of native coronary artery without angina pectoris: Secondary | ICD-10-CM | POA: Insufficient documentation

## 2023-02-21 DIAGNOSIS — R0789 Other chest pain: Secondary | ICD-10-CM | POA: Diagnosis present

## 2023-02-21 DIAGNOSIS — I10 Essential (primary) hypertension: Secondary | ICD-10-CM | POA: Diagnosis not present

## 2023-02-21 DIAGNOSIS — R072 Precordial pain: Secondary | ICD-10-CM | POA: Diagnosis not present

## 2023-02-21 DIAGNOSIS — Z7982 Long term (current) use of aspirin: Secondary | ICD-10-CM | POA: Insufficient documentation

## 2023-02-21 LAB — BASIC METABOLIC PANEL
Anion gap: 10 (ref 5–15)
BUN: 11 mg/dL (ref 6–20)
CO2: 24 mmol/L (ref 22–32)
Calcium: 9.4 mg/dL (ref 8.9–10.3)
Chloride: 103 mmol/L (ref 98–111)
Creatinine, Ser: 0.96 mg/dL (ref 0.61–1.24)
GFR, Estimated: >60 mL/min (ref >60)
Glucose, Bld: 119 mg/dL — ABNORMAL HIGH (ref 70–99)
Potassium: 4.2 mmol/L (ref 3.5–5.1)
Sodium: 137 mmol/L (ref 135–145)

## 2023-02-21 LAB — CBC
HCT: 43.2 % (ref 39.0–52.0)
Hemoglobin: 14.4 g/dL (ref 13.0–17.0)
MCH: 28.5 pg (ref 26.0–34.0)
MCHC: 33.3 g/dL (ref 30.0–36.0)
MCV: 85.4 fL (ref 80.0–100.0)
Platelets: 292 10*3/uL (ref 150–400)
RBC: 5.06 MIL/uL (ref 4.22–5.81)
RDW: 12.7 % (ref 11.5–15.5)
WBC: 6.8 10*3/uL (ref 4.0–10.5)
nRBC: 0 % (ref 0.0–0.2)

## 2023-02-21 LAB — TROPONIN I (HIGH SENSITIVITY): Troponin I (High Sensitivity): 4 ng/L (ref <18)

## 2023-02-21 NOTE — ED Triage Notes (Signed)
C/O intermittent chest pressure that is worse with activity x2 days. Denies radiation and sob Headache 1hr PTA.  Pt reports stopped Plavix and ASA on Saturday for hip surgery on 02/22/2023.

## 2023-02-21 NOTE — ED Notes (Signed)
Attempted IV and pt jerked his arm back several times and refused to allow me to attempt again stating he usually has ultrasound IV placement.

## 2023-02-21 NOTE — Discharge Instructions (Addendum)
The workup in the emergency room including heart enzyme, basic labs and EKG is reassuring.  Your blood pressure is also remained elevated, but not at dangerous levels while in the ER that requires immediate actions.  We recommend that after you are done with your procedure and are not in severe pain, you check your blood pressure first thing in the morning and again right before you go to bed and keep a log of it.  Once you have more than 1 week worth of data, you might want to see your PCP to see if additional adjustment of blood pressure is needed.  We recommend that you discuss the chest pressure with the orthopedic surgeon.  They might require cardiology clearance.  Please return to the ER if you have worsening chest pain, shortness of breath, pain radiating to your jaw, shoulder, or back, sweats or fainting. Otherwise see the Cardiologist or your primary care doctor as requested.

## 2023-02-21 NOTE — ED Provider Notes (Signed)
[place] EMERGENCY DEPARTMENT AT [facility] Provider Note   CSN: [id number] Arrival date & time: 02/21/23  [id number]     History  Chief Complaint  Patient presents with   Chest Pain    Mike Shepard is a 57 y.o. male.  HPI     57 year old male comes in with chief complaint of chest pain.  Patient states that over the last few days he has been having intermittent episodes of chest pressure.  Chest pressure will last only for a second, it is sharp.  There is no specific evoking, aggravating or relieving factors.  Sometimes the episode occurs while he is sitting other times when he is moving.  Pain is nonradiating.  He denies any associated nausea, vomiting, fevers, chills,shortness of breath.  Patient has known carotid artery disease.  He is supposed to get his hip surgery completed, and is off of Plavix.  He has been cleared by cardiology.  He states that he checked his blood pressure earlier today and it was in the 200 range, which got him nervous.  He took an extra Coreg prior To coming to the emergency room.  Currently he is chest pain-free.  Home Medications Prior to Admission medications   Medication Sig Start Date End Date Taking? Authorizing Provider  amLODipine (NORVASC) 10 MG tablet Take 1 tablet (10 mg total) by mouth daily. 01/09/23   [name], MD  aspirin EC 81 MG EC tablet Take 1 tablet (81 mg total) by mouth daily. 06/04/19   Rhyne, Hulen Shouts, PA-C  atorvastatin (LIPITOR) 80 MG tablet Take 1 tablet (80 mg total) by mouth daily. 03/23/22   Colon Branch, MD  buprenorphine (SUBUTEX) 2 MG SUBL SL tablet Place 1 tablet (2 mg total) under the tongue daily. Patient not taking: Reported on 02/08/2023 12/15/22   Colon Branch, MD  buprenorphine (SUBUTEX) 8 MG SUBL SL tablet Place 8 mg under the tongue daily.    [provider]  carvedilol (COREG) 25 MG tablet Take 1 tablet (25 mg total) by mouth 2 (two) times daily with a meal. 01/09/23   [name],  MD  clopidogrel (PLAVIX) 75 MG tablet Take 1 tablet by mouth once daily 12/12/22   Colon Branch, MD  ezetimibe (ZETIA) 10 MG tablet Take 1 tablet (10 mg total) by mouth daily. 06/12/22   Colon Branch, MD  fluticasone [facility]) 50 MCG/ACT nasal spray Place 1 spray into both nostrils daily as needed for allergies or rhinitis.    [provider]  hydrALAZINE (APRESOLINE) 100 MG tablet TAKE 1 TABLET BY MOUTH THREE TIMES DAILY 12/12/22   [name], MD  Multiple Vitamin (MULTIVITAMIN WITH MINERALS) TABS Take 1 tablet by mouth daily.    [provider]  valsartan (DIOVAN) 320 MG tablet Take 1 tablet (320 mg total) by mouth daily. 11/30/22   [name], MD      Allergies    Patient has no known allergies.    Review of Systems   Review of Systems  All other systems reviewed and are negative.   Physical Exam Updated Vital Signs There were no vitals taken for this visit. Physical Exam Vitals and nursing note reviewed.  Constitutional:      Appearance: He is well-developed.  HENT:     Head: Atraumatic.  Cardiovascular:     Rate and Rhythm: Normal rate.  Pulmonary:     Effort: Pulmonary effort is normal.  Musculoskeletal:  Cervical back: Neck supple.  Skin:    General: Skin is warm.  Neurological:     Mental Status: He is alert and oriented to person, place, and time.     ED Results / Procedures / Treatments   Labs (all labs ordered are listed, but only abnormal results are displayed) Labs Reviewed  BASIC METABOLIC PANEL - Abnormal; Notable for the following components:      Result Value   Glucose, Bld 119 (*)    All other components within normal limits  CBC  TROPONIN I (HIGH SENSITIVITY)  TROPONIN I (HIGH SENSITIVITY)    EKG None   Date: 02/21/2023  Rate: 62  Rhythm: normal sinus rhythm  QRS Axis: normal  Intervals: normal  ST/T Wave abnormalities: normal, TWI in lead V2 is new, III is old  Conduction Disutrbances: none  Narrative  Interpretation: unremarkable    Radiology DG Chest Portable 1 View  Result Date: 02/21/2023 CLINICAL DATA:  Intermittent chest pressure for 2 days. EXAM: PORTABLE CHEST 1 VIEW COMPARISON:  Chest radiographs 02/06/2020 and CT 09/01/2022 FINDINGS: The cardiomediastinal silhouette is unchanged with normal heart size. No airspace consolidation, edema, pleural effusion, or pneumothorax is identified. No acute osseous abnormality is seen. IMPRESSION: No active disease. Electronically Signed   By: Logan Bores M.D.   On: 02/21/2023 09:23    Procedures Procedures    Medications Ordered in ED Medications - No data to display  ED Course/ Medical Decision Making/ A&P             HEART Score: 3                Medical Decision Making Amount and/or Complexity of Data Reviewed Labs: ordered. Radiology: ordered.  57 year old male comes in with chief complaint of intermittent episodes of chest pressure.  He has history of carotid artery disease, he is supposed to get hip replacement surgery soon.  Patient has atypical precordial chest pain that is unprovoked and will last only for few seconds.  He will have about 5 episodes a day.  Today's blood pressure was elevated, prompting him to come to the emergency room.  He has history of hypertension for which he is on specific meds.  In the emergency room patient is pain-free.  He also is noted to have normal blood pressure.  Differential diagnosis considered for this patient includes acute coronary syndrome, anxiety, musculoskeletal pain.  EKG is not showing any acute findings.  Patient's hear score is 3.  We will get troponins, place patient on cardiac monitor and cycle his blood pressure.  Reassessment: Patient's initial EKG was completed several hours after his presentation.  Effectively it was more than 3 hours after the chest pain episode, it is negative.  Hear score is 3.  I do not think a second troponin is needed. Results of the ER workup  discussed with the patient.  He is comfortable with the plan for continued cardiology follow-up.  He states that he was cleared by cardiology service already for the surgery.  I still advised him to discuss this episode with either his cardiologist or the orthopedist to ensure there is no delay in the procedure.  Patient and wife in agreement.  Stable for discharge.  Blood pressure has remained stable in the ER.  Slightly elevated, but no need for emergent management.  Final Clinical Impression(s) / ED Diagnoses Final diagnoses:  Precordial chest pain  Hypertension, unspecified type    Rx / DC Orders ED Discharge Orders  None         Varney Biles, MD 02/21/23 1214

## 2023-02-22 ENCOUNTER — Ambulatory Visit (HOSPITAL_BASED_OUTPATIENT_CLINIC_OR_DEPARTMENT_OTHER): Payer: 59 | Admitting: Anesthesiology

## 2023-02-22 ENCOUNTER — Other Ambulatory Visit: Payer: Self-pay

## 2023-02-22 ENCOUNTER — Ambulatory Visit (HOSPITAL_COMMUNITY)
Admission: RE | Admit: 2023-02-22 | Discharge: 2023-02-23 | Disposition: A | Discharge status: Home / Self Care | Payer: 59 | Source: Ambulatory Visit | Attending: Orthopedic Surgery | Admitting: Orthopedic Surgery

## 2023-02-22 ENCOUNTER — Ambulatory Visit (HOSPITAL_COMMUNITY): Payer: 59

## 2023-02-22 ENCOUNTER — Encounter (HOSPITAL_COMMUNITY): Payer: Self-pay | Admitting: Orthopedic Surgery

## 2023-02-22 ENCOUNTER — Encounter (HOSPITAL_COMMUNITY)
Admission: RE | Disposition: A | Discharge status: Home / Self Care | Payer: Self-pay | Source: Ambulatory Visit | Attending: Orthopedic Surgery

## 2023-02-22 ENCOUNTER — Ambulatory Visit (HOSPITAL_COMMUNITY): Payer: 59 | Admitting: Physician Assistant

## 2023-02-22 DIAGNOSIS — I10 Essential (primary) hypertension: Secondary | ICD-10-CM

## 2023-02-22 DIAGNOSIS — Z09 Encounter for follow-up examination after completed treatment for conditions other than malignant neoplasm: Secondary | ICD-10-CM | POA: Diagnosis not present

## 2023-02-22 DIAGNOSIS — Z87891 Personal history of nicotine dependence: Secondary | ICD-10-CM

## 2023-02-22 DIAGNOSIS — Z01818 Encounter for other preprocedural examination: Secondary | ICD-10-CM

## 2023-02-22 DIAGNOSIS — M1611 Unilateral primary osteoarthritis, right hip: Secondary | ICD-10-CM

## 2023-02-22 DIAGNOSIS — K219 Gastro-esophageal reflux disease without esophagitis: Secondary | ICD-10-CM | POA: Diagnosis not present

## 2023-02-22 DIAGNOSIS — Z96641 Presence of right artificial hip joint: Secondary | ICD-10-CM

## 2023-02-22 DIAGNOSIS — E119 Type 2 diabetes mellitus without complications: Secondary | ICD-10-CM | POA: Insufficient documentation

## 2023-02-22 DIAGNOSIS — G4733 Obstructive sleep apnea (adult) (pediatric): Secondary | ICD-10-CM | POA: Insufficient documentation

## 2023-02-22 DIAGNOSIS — Z7902 Long term (current) use of antithrombotics/antiplatelets: Secondary | ICD-10-CM | POA: Diagnosis not present

## 2023-02-22 HISTORY — PX: TOTAL HIP ARTHROPLASTY: SHX124

## 2023-02-22 LAB — TYPE AND SCREEN
ABO/RH(D): O POS
Antibody Screen: NEGATIVE

## 2023-02-22 SURGERY — ARTHROPLASTY, HIP, TOTAL, ANTERIOR APPROACH
Anesthesia: General | Site: Hip | Laterality: Right

## 2023-02-22 MED ORDER — EPHEDRINE 5 MG/ML INJ
INTRAVENOUS | Status: AC
  Filled 2023-02-22: qty 5

## 2023-02-22 MED ORDER — ACETAMINOPHEN 500 MG PO TABS
500.0000 mg | ORAL_TABLET | Freq: Four times a day (QID) | ORAL | Status: DC
  Administered 2023-02-23: 500 mg via ORAL
  Filled 2023-02-22: qty 1

## 2023-02-22 MED ORDER — ATORVASTATIN CALCIUM 40 MG PO TABS
80.0000 mg | ORAL_TABLET | Freq: Every day | ORAL | Status: DC
  Administered 2023-02-22 – 2023-02-23 (×2): 80 mg via ORAL
  Filled 2023-02-22 (×2): qty 2

## 2023-02-22 MED ORDER — CARVEDILOL 25 MG PO TABS
25.0000 mg | ORAL_TABLET | Freq: Two times a day (BID) | ORAL | Status: DC
  Administered 2023-02-22 – 2023-02-23 (×2): 25 mg via ORAL
  Filled 2023-02-22 (×2): qty 1

## 2023-02-22 MED ORDER — DIPHENHYDRAMINE HCL 12.5 MG/5ML PO ELIX: 12.5000 mg | ORAL_SOLUTION | ORAL | Status: DC | PRN

## 2023-02-22 MED ORDER — ONDANSETRON HCL 4 MG/2ML IJ SOLN
INTRAMUSCULAR | Status: AC
  Filled 2023-02-22: qty 4

## 2023-02-22 MED ORDER — BUPRENORPHINE HCL 2 MG SL SUBL
8.0000 mg | SUBLINGUAL_TABLET | Freq: Every day | SUBLINGUAL | Status: DC
  Administered 2023-02-22 – 2023-02-23 (×2): 8 mg via SUBLINGUAL
  Filled 2023-02-22 (×2): qty 4

## 2023-02-22 MED ORDER — SODIUM CHLORIDE 0.9 % IR SOLN
Status: DC | PRN
  Administered 2023-02-22: 3000 mL
  Administered 2023-02-22: 1000 mL

## 2023-02-22 MED ORDER — AMLODIPINE BESYLATE 10 MG PO TABS
10.0000 mg | ORAL_TABLET | Freq: Every day | ORAL | Status: DC
  Administered 2023-02-23: 10 mg via ORAL
  Filled 2023-02-22: qty 1

## 2023-02-22 MED ORDER — PROPOFOL 10 MG/ML IV BOLUS
INTRAVENOUS | Status: AC
  Filled 2023-02-22: qty 20

## 2023-02-22 MED ORDER — CLOPIDOGREL BISULFATE 75 MG PO TABS
75.0000 mg | ORAL_TABLET | Freq: Every day | ORAL | Status: DC
  Administered 2023-02-22 – 2023-02-23 (×2): 75 mg via ORAL
  Filled 2023-02-22 (×2): qty 1

## 2023-02-22 MED ORDER — BUPIVACAINE-EPINEPHRINE (PF) 0.25% -1:200000 IJ SOLN
INTRAMUSCULAR | Status: AC
  Filled 2023-02-22: qty 30

## 2023-02-22 MED ORDER — POVIDONE-IODINE 10 % EX SWAB
2.0000 | Freq: Once | CUTANEOUS | Status: AC
  Administered 2023-02-22: 2 via TOPICAL

## 2023-02-22 MED ORDER — MIDAZOLAM HCL 5 MG/5ML IJ SOLN
INTRAMUSCULAR | Status: DC | PRN
  Administered 2023-02-22: 2 mg via INTRAVENOUS

## 2023-02-22 MED ORDER — ALUM & MAG HYDROXIDE-SIMETH 200-200-20 MG/5ML PO SUSP: 30.0000 mL | ORAL | Status: DC | PRN

## 2023-02-22 MED ORDER — HYDROCODONE-ACETAMINOPHEN 5-325 MG PO TABS: 1.0000 | ORAL_TABLET | ORAL | Status: DC | PRN

## 2023-02-22 MED ORDER — LIDOCAINE 2% (20 MG/ML) 5 ML SYRINGE
INTRAMUSCULAR | Status: DC | PRN
  Administered 2023-02-22: 100 mg via INTRAVENOUS

## 2023-02-22 MED ORDER — KETAMINE HCL 10 MG/ML IJ SOLN
INTRAMUSCULAR | Status: DC | PRN
  Administered 2023-02-22: 20 mg via INTRAVENOUS
  Administered 2023-02-22: 10 mg via INTRAVENOUS
  Administered 2023-02-22: 20 mg via INTRAVENOUS

## 2023-02-22 MED ORDER — PHENYLEPHRINE HCL-NACL 20-0.9 MG/250ML-% IV SOLN
INTRAVENOUS | Status: DC | PRN
  Administered 2023-02-22: 35 ug/min via INTRAVENOUS

## 2023-02-22 MED ORDER — EPHEDRINE SULFATE-NACL 50-0.9 MG/10ML-% IV SOSY
PREFILLED_SYRINGE | INTRAVENOUS | Status: DC | PRN
  Administered 2023-02-22 (×5): 5 mg via INTRAVENOUS

## 2023-02-22 MED ORDER — LIDOCAINE HCL (PF) 2 % IJ SOLN
INTRAMUSCULAR | Status: AC
  Filled 2023-02-22: qty 5

## 2023-02-22 MED ORDER — ACETAMINOPHEN 500 MG PO TABS
1000.0000 mg | ORAL_TABLET | Freq: Once | ORAL | Status: AC
  Filled 2023-02-22: qty 2

## 2023-02-22 MED ORDER — ISOPROPYL ALCOHOL 70 % SOLN
Status: DC | PRN
  Administered 2023-02-22: 1 via TOPICAL

## 2023-02-22 MED ORDER — FENTANYL CITRATE (PF) 100 MCG/2ML IJ SOLN
INTRAMUSCULAR | Status: AC
  Filled 2023-02-22: qty 2

## 2023-02-22 MED ORDER — FENTANYL CITRATE PF 50 MCG/ML IJ SOSY
PREFILLED_SYRINGE | INTRAMUSCULAR | Status: AC
  Filled 2023-02-22: qty 1

## 2023-02-22 MED ORDER — HYDRALAZINE HCL 50 MG PO TABS
100.0000 mg | ORAL_TABLET | Freq: Three times a day (TID) | ORAL | Status: DC
  Administered 2023-02-22 – 2023-02-23 (×3): 100 mg via ORAL
  Filled 2023-02-22 (×3): qty 2

## 2023-02-22 MED ORDER — FENTANYL CITRATE PF 50 MCG/ML IJ SOSY
25.0000 ug | PREFILLED_SYRINGE | INTRAMUSCULAR | Status: DC | PRN
  Administered 2023-02-22 (×3): 50 ug via INTRAVENOUS

## 2023-02-22 MED ORDER — PROPOFOL 10 MG/ML IV BOLUS
INTRAVENOUS | Status: DC | PRN
  Administered 2023-02-22: 180 mg via INTRAVENOUS

## 2023-02-22 MED ORDER — FENTANYL CITRATE PF 50 MCG/ML IJ SOSY
PREFILLED_SYRINGE | INTRAMUSCULAR | Status: AC
  Filled 2023-02-22: qty 2

## 2023-02-22 MED ORDER — HYDROCODONE-ACETAMINOPHEN 7.5-325 MG PO TABS
1.0000 | ORAL_TABLET | ORAL | Status: DC | PRN
  Administered 2023-02-22 – 2023-02-23 (×5): 2 via ORAL
  Filled 2023-02-22 (×4): qty 2

## 2023-02-22 MED ORDER — SODIUM CHLORIDE (PF) 0.9 % IJ SOLN
INTRAMUSCULAR | Status: DC | PRN
  Administered 2023-02-22: 30 mL

## 2023-02-22 MED ORDER — DEXAMETHASONE SODIUM PHOSPHATE 10 MG/ML IJ SOLN
INTRAMUSCULAR | Status: DC | PRN
  Administered 2023-02-22: 10 mg via INTRAVENOUS

## 2023-02-22 MED ORDER — ACETAMINOPHEN 500 MG PO TABS
1000.0000 mg | ORAL_TABLET | Freq: Once | ORAL | Status: AC
  Administered 2023-02-22: 1000 mg via ORAL

## 2023-02-22 MED ORDER — ADULT MULTIVITAMIN W/MINERALS CH
1.0000 | ORAL_TABLET | Freq: Every day | ORAL | Status: DC
  Administered 2023-02-22 – 2023-02-23 (×2): 1 via ORAL
  Filled 2023-02-22 (×2): qty 1

## 2023-02-22 MED ORDER — ONDANSETRON HCL 4 MG/2ML IJ SOLN
INTRAMUSCULAR | Status: DC | PRN
  Administered 2023-02-22: 4 mg via INTRAVENOUS

## 2023-02-22 MED ORDER — CEFAZOLIN SODIUM-DEXTROSE 2-4 GM/100ML-% IV SOLN
2.0000 g | Freq: Four times a day (QID) | INTRAVENOUS | Status: AC
  Administered 2023-02-22 (×2): 2 g via INTRAVENOUS
  Filled 2023-02-22 (×2): qty 100

## 2023-02-22 MED ORDER — HYDROMORPHONE HCL 1 MG/ML IJ SOLN
0.5000 mg | INTRAMUSCULAR | Status: AC | PRN
  Administered 2023-02-22 (×2): 0.5 mg via INTRAVENOUS

## 2023-02-22 MED ORDER — PROMETHAZINE HCL 25 MG/ML IJ SOLN: 6.2500 mg | INTRAMUSCULAR | Status: DC | PRN

## 2023-02-22 MED ORDER — POVIDONE-IODINE 10 % EX SWAB: 2.0000 | Freq: Once | CUTANEOUS | Status: AC

## 2023-02-22 MED ORDER — POLYETHYLENE GLYCOL 3350 17 G PO PACK: 17.0000 g | PACK | Freq: Every day | ORAL | Status: DC | PRN

## 2023-02-22 MED ORDER — MORPHINE SULFATE (PF) 2 MG/ML IV SOLN
0.5000 mg | INTRAVENOUS | Status: DC | PRN
  Administered 2023-02-22: 1 mg via INTRAVENOUS
  Filled 2023-02-22: qty 1

## 2023-02-22 MED ORDER — ROCURONIUM BROMIDE 10 MG/ML (PF) SYRINGE
PREFILLED_SYRINGE | INTRAVENOUS | Status: DC | PRN
  Administered 2023-02-22 (×2): 20 mg via INTRAVENOUS
  Administered 2023-02-22: 60 mg via INTRAVENOUS

## 2023-02-22 MED ORDER — MIDAZOLAM HCL 2 MG/2ML IJ SOLN
INTRAMUSCULAR | Status: AC
  Filled 2023-02-22: qty 2

## 2023-02-22 MED ORDER — METOCLOPRAMIDE HCL 5 MG PO TABS: 5.0000 mg | ORAL_TABLET | Freq: Three times a day (TID) | ORAL | Status: DC | PRN

## 2023-02-22 MED ORDER — LACTATED RINGERS IV SOLN: INTRAVENOUS | Status: DC

## 2023-02-22 MED ORDER — METHOCARBAMOL 500 MG PO TABS
500.0000 mg | ORAL_TABLET | Freq: Four times a day (QID) | ORAL | Status: DC | PRN
  Administered 2023-02-23 (×2): 500 mg via ORAL
  Filled 2023-02-22 (×2): qty 1

## 2023-02-22 MED ORDER — ORAL CARE MOUTH RINSE: 15.0000 mL | Freq: Once | OROMUCOSAL | Status: AC

## 2023-02-22 MED ORDER — PHENOL 1.4 % MT LIQD: 1.0000 | OROMUCOSAL | Status: DC | PRN

## 2023-02-22 MED ORDER — BUPIVACAINE-EPINEPHRINE 0.25% -1:200000 IJ SOLN
INTRAMUSCULAR | Status: DC | PRN
  Administered 2023-02-22: 30 mL

## 2023-02-22 MED ORDER — ROCURONIUM BROMIDE 10 MG/ML (PF) SYRINGE
PREFILLED_SYRINGE | INTRAVENOUS | Status: AC
  Filled 2023-02-22: qty 20

## 2023-02-22 MED ORDER — SODIUM CHLORIDE (PF) 0.9 % IJ SOLN
INTRAMUSCULAR | Status: AC
  Filled 2023-02-22: qty 30

## 2023-02-22 MED ORDER — SODIUM CHLORIDE 0.9 % IV SOLN: INTRAVENOUS | Status: DC

## 2023-02-22 MED ORDER — HYDROMORPHONE HCL 1 MG/ML IJ SOLN
INTRAMUSCULAR | Status: AC
  Filled 2023-02-22: qty 1

## 2023-02-22 MED ORDER — TRANEXAMIC ACID-NACL 1000-0.7 MG/100ML-% IV SOLN
1000.0000 mg | INTRAVENOUS | Status: AC
  Administered 2023-02-22: 1000 mg via INTRAVENOUS
  Filled 2023-02-22: qty 100

## 2023-02-22 MED ORDER — DEXAMETHASONE SODIUM PHOSPHATE 10 MG/ML IJ SOLN
INTRAMUSCULAR | Status: AC
  Filled 2023-02-22: qty 2

## 2023-02-22 MED ORDER — FENTANYL CITRATE (PF) 100 MCG/2ML IJ SOLN
INTRAMUSCULAR | Status: DC | PRN
  Administered 2023-02-22 (×2): 50 ug via INTRAVENOUS
  Administered 2023-02-22: 100 ug via INTRAVENOUS

## 2023-02-22 MED ORDER — PHENYLEPHRINE 80 MCG/ML (10ML) SYRINGE FOR IV PUSH (FOR BLOOD PRESSURE SUPPORT)
PREFILLED_SYRINGE | INTRAVENOUS | Status: DC | PRN
  Administered 2023-02-22: 160 ug via INTRAVENOUS

## 2023-02-22 MED ORDER — CEFAZOLIN SODIUM-DEXTROSE 2-4 GM/100ML-% IV SOLN
2.0000 g | INTRAVENOUS | Status: AC
  Administered 2023-02-22: 2 g via INTRAVENOUS
  Filled 2023-02-22: qty 100

## 2023-02-22 MED ORDER — ASPIRIN 81 MG PO CHEW
81.0000 mg | CHEWABLE_TABLET | Freq: Two times a day (BID) | ORAL | Status: DC
  Administered 2023-02-22 – 2023-02-23 (×2): 81 mg via ORAL
  Filled 2023-02-22 (×2): qty 1

## 2023-02-22 MED ORDER — ORAL CARE MOUTH RINSE: 15.0000 mL | OROMUCOSAL | Status: DC | PRN

## 2023-02-22 MED ORDER — HYDROCODONE-ACETAMINOPHEN 7.5-325 MG PO TABS
ORAL_TABLET | ORAL | Status: AC
  Filled 2023-02-22: qty 2

## 2023-02-22 MED ORDER — FLUTICASONE PROPIONATE 50 MCG/ACT NA SUSP: 1.0000 | Freq: Every day | NASAL | Status: DC | PRN

## 2023-02-22 MED ORDER — ONDANSETRON HCL 4 MG/2ML IJ SOLN: 4.0000 mg | Freq: Four times a day (QID) | INTRAMUSCULAR | Status: DC | PRN

## 2023-02-22 MED ORDER — EZETIMIBE 10 MG PO TABS
10.0000 mg | ORAL_TABLET | Freq: Every day | ORAL | Status: DC
  Administered 2023-02-22 – 2023-02-23 (×2): 10 mg via ORAL
  Filled 2023-02-22 (×2): qty 1

## 2023-02-22 MED ORDER — KETOROLAC TROMETHAMINE 30 MG/ML IJ SOLN
INTRAMUSCULAR | Status: DC | PRN
  Administered 2023-02-22: 30 mg via INTRAMUSCULAR

## 2023-02-22 MED ORDER — MENTHOL 3 MG MT LOZG: 1.0000 | LOZENGE | OROMUCOSAL | Status: DC | PRN

## 2023-02-22 MED ORDER — KETOROLAC TROMETHAMINE 30 MG/ML IJ SOLN
INTRAMUSCULAR | Status: AC
  Filled 2023-02-22: qty 1

## 2023-02-22 MED ORDER — DOCUSATE SODIUM 100 MG PO CAPS
100.0000 mg | ORAL_CAPSULE | Freq: Two times a day (BID) | ORAL | Status: DC
  Administered 2023-02-22 – 2023-02-23 (×3): 100 mg via ORAL
  Filled 2023-02-22 (×3): qty 1

## 2023-02-22 MED ORDER — CHLORHEXIDINE GLUCONATE 0.12 % MT SOLN
15.0000 mL | Freq: Once | OROMUCOSAL | Status: AC
  Administered 2023-02-22: 15 mL via OROMUCOSAL

## 2023-02-22 MED ORDER — METHOCARBAMOL 500 MG IVPB - SIMPLE MED
500.0000 mg | Freq: Four times a day (QID) | INTRAVENOUS | Status: DC | PRN
  Administered 2023-02-22: 500 mg via INTRAVENOUS

## 2023-02-22 MED ORDER — SUGAMMADEX SODIUM 200 MG/2ML IV SOLN
INTRAVENOUS | Status: DC | PRN
  Administered 2023-02-22: 200 mg via INTRAVENOUS

## 2023-02-22 MED ORDER — METOCLOPRAMIDE HCL 5 MG/ML IJ SOLN: 5.0000 mg | Freq: Three times a day (TID) | INTRAMUSCULAR | Status: DC | PRN

## 2023-02-22 MED ORDER — SENNA 8.6 MG PO TABS
1.0000 | ORAL_TABLET | Freq: Two times a day (BID) | ORAL | Status: DC
  Administered 2023-02-22 – 2023-02-23 (×3): 8.6 mg via ORAL
  Filled 2023-02-22 (×3): qty 1

## 2023-02-22 MED ORDER — KETAMINE HCL 50 MG/5ML IJ SOSY
PREFILLED_SYRINGE | INTRAMUSCULAR | Status: AC
  Filled 2023-02-22: qty 5

## 2023-02-22 MED ORDER — ONDANSETRON HCL 4 MG PO TABS: 4.0000 mg | ORAL_TABLET | Freq: Four times a day (QID) | ORAL | Status: DC | PRN

## 2023-02-22 MED ORDER — ACETAMINOPHEN 325 MG PO TABS: 325.0000 mg | ORAL_TABLET | Freq: Four times a day (QID) | ORAL | Status: DC | PRN

## 2023-02-22 MED ORDER — METHOCARBAMOL 500 MG IVPB - SIMPLE MED
INTRAVENOUS | Status: AC
  Filled 2023-02-22: qty 55

## 2023-02-22 MED ORDER — PHENYLEPHRINE 80 MCG/ML (10ML) SYRINGE FOR IV PUSH (FOR BLOOD PRESSURE SUPPORT)
PREFILLED_SYRINGE | INTRAVENOUS | Status: AC
  Filled 2023-02-22: qty 10

## 2023-02-22 SURGICAL SUPPLY — 56 items
ADH SKN CLS APL DERMABOND .7 (GAUZE/BANDAGES/DRESSINGS) ×1
APL PRP STRL LF DISP 70% ISPRP (MISCELLANEOUS) ×1
BAG COUNTER SPONGE SURGICOUNT (BAG) IMPLANT
BAG DECANTER FOR FLEXI CONT (MISCELLANEOUS) IMPLANT
BAG SPEC THK2 15X12 ZIP CLS (MISCELLANEOUS)
BAG SPNG CNTER NS LX DISP (BAG)
BAG ZIPLOCK 12X15 (MISCELLANEOUS) IMPLANT
CHLORAPREP W/TINT 26 (MISCELLANEOUS) ×2 IMPLANT
COVER PERINEAL POST (MISCELLANEOUS) ×2 IMPLANT
COVER SURGICAL LIGHT HANDLE (MISCELLANEOUS) ×2 IMPLANT
DERMABOND ADVANCED .7 DNX12 (GAUZE/BANDAGES/DRESSINGS) ×4 IMPLANT
DRAPE IMP U-DRAPE 54X76 (DRAPES) ×2 IMPLANT
DRAPE SHEET LG 3/4 BI-LAMINATE (DRAPES) ×6 IMPLANT
DRAPE STERI IOBAN 125X83 (DRAPES) ×2 IMPLANT
DRAPE U-SHAPE 47X51 STRL (DRAPES) ×4 IMPLANT
DRSG AQUACEL AG ADV 3.5X10 (GAUZE/BANDAGES/DRESSINGS) ×2 IMPLANT
ELECT REM PT RETURN 15FT ADLT (MISCELLANEOUS) ×2 IMPLANT
G7 VIT E NTRL LNR 36 SZG (Miscellaneous) IMPLANT
GAUZE SPONGE 4X4 12PLY STRL (GAUZE/BANDAGES/DRESSINGS) ×2 IMPLANT
GLOVE BIO SURGEON STRL SZ7 (GLOVE) ×2 IMPLANT
GLOVE BIO SURGEON STRL SZ8.5 (GLOVE) ×4 IMPLANT
GLOVE BIOGEL PI IND STRL 7.5 (GLOVE) ×2 IMPLANT
GLOVE BIOGEL PI IND STRL 8.5 (GLOVE) ×2 IMPLANT
GOWN SPEC L3 XXLG W/TWL (GOWN DISPOSABLE) ×2 IMPLANT
GOWN STRL REUS W/ TWL XL LVL3 (GOWN DISPOSABLE) ×2 IMPLANT
GOWN STRL REUS W/TWL XL LVL3 (GOWN DISPOSABLE) ×1
HANDPIECE INTERPULSE COAX TIP (DISPOSABLE) ×1
HEAD CERAMIC BIOLOX 36 (Head) IMPLANT
HOLDER FOLEY CATH W/STRAP (MISCELLANEOUS) ×2 IMPLANT
HOOD PEEL AWAY T7 (MISCELLANEOUS) ×6 IMPLANT
KIT TURNOVER KIT A (KITS) IMPLANT
MANIFOLD NEPTUNE II (INSTRUMENTS) ×2 IMPLANT
MARKER SKIN DUAL TIP RULER LAB (MISCELLANEOUS) ×2 IMPLANT
NDL SAFETY ECLIP 18X1.5 (MISCELLANEOUS) ×2 IMPLANT
NDL SPNL 18GX3.5 QUINCKE PK (NEEDLE) ×2 IMPLANT
NEEDLE SPNL 18GX3.5 QUINCKE PK (NEEDLE) ×1 IMPLANT
PACK ANTERIOR HIP CUSTOM (KITS) ×2 IMPLANT
PENCIL SMOKE EVACUATOR (MISCELLANEOUS) IMPLANT
SAW OSC TIP CART 19.5X105X1.3 (SAW) ×2 IMPLANT
SEALER BIPOLAR AQUA 6.0 (INSTRUMENTS) ×2 IMPLANT
SET HNDPC FAN SPRY TIP SCT (DISPOSABLE) ×2 IMPLANT
SHELL ACET G7 4H 58 SZG HIP (Shell) IMPLANT
SOLUTION PRONTOSAN WOUND 350ML (IRRIGATION / IRRIGATOR) ×2 IMPLANT
SPIKE FLUID TRANSFER (MISCELLANEOUS) ×2 IMPLANT
STEM FEM CL 119X39.6 SZ17 133D (Stem) IMPLANT
SUT MNCRL AB 3-0 PS2 18 (SUTURE) ×2 IMPLANT
SUT MON AB 2-0 CT1 36 (SUTURE) ×2 IMPLANT
SUT STRATAFIX PDO 1 14 VIOLET (SUTURE) ×1
SUT STRATFX PDO 1 14 VIOLET (SUTURE) ×1 IMPLANT
SUT VIC AB 2-0 CT1 27 (SUTURE)
SUT VIC AB 2-0 CT1 TAPERPNT 27 (SUTURE) IMPLANT
SUTURE STRATFX PDO 1 14 VIOLET (SUTURE) ×2 IMPLANT
SYR 3ML LL SCALE MARK (SYRINGE) ×2 IMPLANT
TRAY FOLEY MTR SLVR 16FR STAT (SET/KITS/TRAYS/PACK) IMPLANT
TUBE SUCTION HIGH CAP CLEAR NV (SUCTIONS) ×2 IMPLANT
WATER STERILE IRR 1000ML POUR (IV SOLUTION) ×2 IMPLANT

## 2023-02-22 NOTE — Discharge Instructions (Signed)
? ?Dr. Adolphe Fortunato ?Joint Replacement Specialist ?[facility] ?[address] ?[place], [place] [address] ?[phone] ? ? ?TOTAL HIP REPLACEMENT POSTOPERATIVE DIRECTIONS ? ? ? ?Hip Rehabilitation, Guidelines Following Surgery  ? ?WEIGHT BEARING ?Weight bearing as tolerated with assist device (walker, cane, etc) as directed, use it as long as suggested by your surgeon or therapist, typically at least 4-6 weeks. ? ?The results of a hip operation are greatly improved after range of motion and muscle strengthening exercises. Follow all safety measures which are given to protect your hip. If any of these exercises cause increased pain or swelling in your joint, decrease the amount until you are comfortable again. Then slowly increase the exercises. Call your caregiver if you have problems or questions.  ? ?HOME CARE INSTRUCTIONS  ?Most of the following instructions are designed to prevent the dislocation of your new hip.  ?Remove items at home which could result in a fall. This includes throw rugs or furniture in walking pathways.  ?Continue medications as instructed at time of discharge. ?You may have some home medications which will be placed on hold until you complete the course of blood thinner medication. ?You may start showering once you are discharged home. Do not remove your dressing. ?Do not put on socks or shoes without following the instructions of your caregivers.   ?Sit on chairs with arms. Use the chair arms to help push yourself up when arising.  ?Arrange for the use of a toilet seat elevator so you are not sitting low.  ?Walk with walker as instructed.  ?You may resume a sexual relationship in one month or when given the OK by your caregiver.  ?Use walker as long as suggested by your caregivers.  ?You may put full weight on your legs and walk as much as is comfortable. ?Avoid periods of inactivity such as sitting longer than an hour when not asleep. This helps prevent blood  clots.  ?You may return to work once you are cleared by your surgeon.  ?Do not drive a car for 6 weeks or until released by your surgeon.  ?Do not drive while taking narcotics.  ?Wear elastic stockings for two weeks following surgery during the day but you may remove then at night.  ?Make sure you keep all of your appointments after your operation with all of your doctors and caregivers. You should call the office at the above phone number and make an appointment for approximately two weeks after the date of your surgery. ?Please pick up a stool softener and laxative for home use as long as you are requiring pain medications. ?ICE to the affected hip every three hours for 30 minutes at a time and then as needed for pain and swelling. Continue to use ice on the hip for pain and swelling from surgery. You may notice swelling that will progress down to the foot and ankle.  This is normal after surgery.  Elevate the leg when you are not up walking on it.   ?It is important for you to complete the blood thinner medication as prescribed by your doctor. ?Continue to use the breathing machine which will help keep your temperature down.  It is common for your temperature to cycle up and down following surgery, especially at night when you are not up moving around and exerting yourself.  The breathing machine keeps your lungs expanded and your temperature down. ? ?RANGE OF MOTION AND STRENGTHENING EXERCISES  ?These exercises are designed to help you   keep full movement of your hip joint. Follow your caregiver's or physical therapist's instructions. Perform all exercises about fifteen times, three times per day or as directed. Exercise both hips, even if you have had only one joint replacement. These exercises can be done on a training (exercise) mat, on the floor, on a table or on a bed. Use whatever works the best and is most comfortable for you. Use music or television while you are exercising so that the exercises are a  pleasant break in your day. This will make your life better with the exercises acting as a break in routine you can look forward to.  ?Lying on your back, slowly slide your foot toward your buttocks, raising your knee up off the floor. Then slowly slide your foot back down until your leg is straight again.  ?Lying on your back spread your legs as far apart as you can without causing discomfort.  ?Lying on your side, raise your upper leg and foot straight up from the floor as far as is comfortable. Slowly lower the leg and repeat.  ?Lying on your back, tighten up the muscle in the front of your thigh (quadriceps muscles). You can do this by keeping your leg straight and trying to raise your heel off the floor. This helps strengthen the largest muscle supporting your knee.  ?Lying on your back, tighten up the muscles of your buttocks both with the legs straight and with the knee bent at a comfortable angle while keeping your heel on the floor.  ? ?SKILLED REHAB INSTRUCTIONS: ?If the patient is transferred to a skilled rehab facility following release from the hospital, a list of the current medications will be sent to the facility for the patient to continue.  When discharged from the skilled rehab facility, please have the facility set up the patient's Home Health Physical Therapy prior to being released. Also, the skilled facility will be responsible for providing the patient with their medications at time of release from the facility to include their pain medication and their blood thinner medication. If the patient is still at the rehab facility at time of the two week follow up appointment, the skilled rehab facility will also need to assist the patient in arranging follow up appointment in our office and any transportation needs. ? ?POST-OPERATIVE OPIOID TAPER INSTRUCTIONS: ?It is important to wean off of your opioid medication as soon as possible. If you do not need pain medication after your surgery it is ok  to stop day one. ?Opioids include: ?Codeine, Hydrocodone(Norco, Vicodin), Oxycodone(Percocet, oxycontin) and hydromorphone amongst others.  ?Long term and even short term use of opiods can cause: ?Increased pain response ?Dependence ?Constipation ?Depression ?Respiratory depression ?And more.  ?Withdrawal symptoms can include ?Flu like symptoms ?Nausea, vomiting ?And more ?Techniques to manage these symptoms ?Hydrate well ?Eat regular healthy meals ?Stay active ?Use relaxation techniques(deep breathing, meditating, yoga) ?Do Not substitute Alcohol to help with tapering ?If you have been on opioids for less than two weeks and do not have pain than it is ok to stop all together.  ?Plan to wean off of opioids ?This plan should start within one week post op of your joint replacement. ?Maintain the same interval or time between taking each dose and first decrease the dose.  ?Cut the total daily intake of opioids by one tablet each day ?Next start to increase the time between doses. ?The last dose that should be eliminated is the evening dose.  ? ? ?MAKE   SURE YOU:  ?Understand these instructions.  ?Will watch your condition.  ?Will get help right away if you are not doing well or get worse. ? ?Pick up stool softner and laxative for home use following surgery while on pain medications. ?Do not remove your dressing. ?The dressing is waterproof--it is OK to take showers. ?Continue to use ice for pain and swelling after surgery. ?Do not use any lotions or creams on the incision until instructed by your surgeon. ?Total Hip Protocol. ? ?

## 2023-02-22 NOTE — Interval H&P Note (Signed)
History and Physical Interval Note:  02/22/2023 7:11 AM  Mike Shepard  has presented today for surgery, with the diagnosis of Right hip osteoarthritis.  The various methods of treatment have been discussed with the patient and family. After consideration of risks, benefits and other options for treatment, the patient has consented to  Procedure(s) with comments: [place] (Right) - 150 as a surgical intervention.  The patient's history has been reviewed, patient examined, no change in status, stable for surgery.  I have reviewed the patient's chart and labs.  Questions were answered to the patient's satisfaction.    The risks, benefits, and alternatives were discussed with the patient. There are risks associated with the surgery including, but not limited to, problems with anesthesia (death), infection, instability (giving out of the joint), dislocation, differences in leg length/angulation/rotation, fracture of bones, loosening or failure of implants, hematoma (blood accumulation) which may require surgical drainage, blood clots, pulmonary embolism, nerve injury (foot drop and lateral thigh numbness), and blood vessel injury. The patient understands these risks and elects to proceed.   Hilton Cork Tedric Leeth

## 2023-02-22 NOTE — Anesthesia Procedure Notes (Signed)
Procedure Name: Intubation Date/Time: 02/22/2023 7:36 AM  Performed by: Maxwell Caul, CRNAPre-anesthesia Checklist: Patient identified, Emergency Drugs available, Suction available and Patient being monitored Patient Re-evaluated:Patient Re-evaluated prior to induction Oxygen Delivery Method: Circle system utilized Preoxygenation: Pre-oxygenation with 100% oxygen Induction Type: IV induction Ventilation: Mask ventilation without difficulty Laryngoscope Size: 4 and Mac Grade View: Grade I Tube type: Oral Tube size: 7.5 mm Number of attempts: 1 Airway Equipment and Method: Stylet Placement Confirmation: ETT inserted through vocal cords under direct vision, positive ETCO2 and breath sounds checked- equal and bilateral Secured at: 21 cm Tube secured with: Tape Dental Injury: Teeth and Oropharynx as per pre-operative assessment

## 2023-02-22 NOTE — Anesthesia Postprocedure Evaluation (Signed)
Anesthesia Post Note  Patient: Mike Shepard  Procedure(s) Performed: TOTAL HIP ARTHROPLASTY ANTERIOR APPROACH (Right: Hip)     Patient location during evaluation: PACU Anesthesia Type: General Level of consciousness: awake and alert Pain management: pain level controlled Vital Signs Assessment: post-procedure vital signs reviewed and stable Respiratory status: spontaneous breathing, nonlabored ventilation, respiratory function stable and patient connected to nasal cannula oxygen Cardiovascular status: blood pressure returned to baseline and stable Postop Assessment: no apparent nausea or vomiting Anesthetic complications: no   No notable events documented.  Last Vitals:  Vitals:   02/22/23 1415 02/22/23 1617  BP: 116/82 121/69  Pulse: 70 69  Resp:  18  Temp:  37 C  SpO2: 94% 93%    Last Pain:  Vitals:   02/22/23 1442  TempSrc:   PainSc: [place] Jace Dowe

## 2023-02-22 NOTE — Evaluation (Signed)
Physical Therapy Evaluation Patient Details Name: Mike Shepard MRN: [id number] DOB: 1966/12/13 Today's Date: 02/22/2023  History of Present Illness  57  yo male, S/P R THA, direct anterior on 02/22/23. PMH:HTN, CEA, CVA, neck and back surgery,  gout.  Clinical Impression  Pt admitted with above diagnosis.  Pt currently with functional limitations due to the deficits listed below (see PT Problem List). Pt will benefit from skilled PT to increase their independence and safety with mobility to allow discharge to the venue listed below.     The patient  required min assistance to sit up onto  bed edge, ambulated x 50', began  to feel lightheaded at end of distance.  BP 76/63, back to 127/63 with legs elevated. Patient  feeling better.  Patient  should progress to Dc home soon.     Recommendations for follow up therapy are one component of a multi-disciplinary discharge planning process, led by the attending physician.  Recommendations may be updated based on patient status, additional functional criteria and insurance authorization.  Follow Up Recommendations Follow physician's recommendations for discharge plan and follow up therapies      Assistance Recommended at Discharge Set up Supervision/Assistance  Patient can return home with the following  A little help with walking and/or transfers;A little help with bathing/dressing/bathroom;Assistance with cooking/housework;Assist for transportation;Help with stairs or ramp for entrance    Equipment Recommendations Rolling walker (2 wheels);BSC/3in1  Recommendations for Other Services       Functional Status Assessment Patient has had a recent decline in their functional status and demonstrates the ability to make significant improvements in function in a reasonable and predictable amount of time.     Precautions / Restrictions Precautions Precautions: Fall Restrictions Weight Bearing Restrictions: No      Mobility  Bed  Mobility Overal bed mobility: Needs Assistance Bed Mobility: Supine to Sit     Supine to sit: Min assist     General bed mobility comments: use of belt to self assist right leg, assist with  trunk.    Transfers Overall transfer level: Needs assistance Equipment used: Rolling walker (2 wheels) Transfers: Sit to/from Stand Sit to Stand: Min assist           General transfer comment: cues for hand and  right leg position    Ambulation/Gait Ambulation/Gait assistance: Min assist Gait Distance (Feet): 40 Feet Assistive device: Rolling walker (2 wheels) Gait Pattern/deviations: Step-to pattern, Step-through pattern, Decreased step length - right Gait velocity: decr     General Gait Details: cues for sequence. pt began to feel  light headed aat end of walk  Stairs            Wheelchair Mobility    Modified Rankin (Stroke Patients Only)       Balance Overall balance assessment: Needs assistance Sitting-balance support: No upper extremity supported, Feet supported Sitting balance-Leahy Scale: Good     Standing balance support: Bilateral upper extremity supported, Reliant on assistive device for balance, During functional activity Standing balance-Leahy Scale: Fair                               Pertinent Vitals/Pain Pain Assessment Pain Assessment: 0-10 Pain Score: 4  Pain Location: right hip Pain Descriptors / Indicators: Grimacing, Discomfort Pain Intervention(s): Monitored during session, Premedicated before session, Ice applied    Home Living Family/patient expects to be discharged to:: Private residence Living Arrangements: Children Available Help at Discharge:  Family;Available 24 hours/day Type of Home: House Home Access: Stairs to enter   [employer] of Steps: 1   Home Layout: One level Home Equipment: None      Prior Function Prior Level of Function : Independent/Modified Independent;Working/employed                      Hand Dominance   Dominant Hand: Right    Extremity/Trunk Assessment   Upper Extremity Assessment Upper Extremity Assessment: Overall WFL for tasks assessed    Lower Extremity Assessment Lower Extremity Assessment: RLE deficits/detail RLE Deficits / Details: tolerated  hip flexion to 90 seated, required assist to heel slide,    Cervical / Trunk Assessment Cervical / Trunk Assessment: Normal  Communication   Communication: No difficulties  Cognition Arousal/Alertness: Awake/alert Behavior During Therapy: WFL for tasks assessed/performed Overall Cognitive Status: Within Functional Limits for tasks assessed                                          General Comments      Exercises Total Joint Exercises Heel Slides: AAROM, Right, 5 reps Long Arc Quad: AROM, Right, 5 reps   Assessment/Plan    PT Assessment Patient needs continued PT services  PT Problem List Decreased strength;Decreased knowledge of precautions;Decreased range of motion;Decreased mobility;Cardiopulmonary status limiting activity;Decreased knowledge of use of DME;Decreased activity tolerance;Decreased safety awareness;Pain       PT Treatment Interventions DME instruction;Functional mobility training;Patient/family education;Gait training;Therapeutic activities;Stair training;Therapeutic exercise    PT Goals (Current goals can be found in the Care Plan section)  Acute Rehab PT Goals Patient Stated Goal: go home PT Goal Formulation: With patient/family Time For Goal Achievement: 03/01/23 Potential to Achieve Goals: Good    Frequency 7X/week     Co-evaluation               AM-PAC PT "6 Clicks" Mobility  Outcome Measure Help needed turning from your back to your side while in a flat bed without using bedrails?: A Little Help needed moving from lying on your back to sitting on the side of a flat bed without using bedrails?: A Little Help needed moving to and from a bed  to a chair (including a wheelchair)?: A Little Help needed standing up from a chair using your arms (e.g., wheelchair or bedside chair)?: A Little Help needed to walk in hospital room?: A Little Help needed climbing 3-5 steps with a railing? : A Lot 6 Click Score: 17    End of Session Equipment Utilized During Treatment: Gait belt Activity Tolerance: Treatment limited secondary to medical complications (Comment) (hypotensive) Patient left: in chair;with call bell/phone within reach;with family/visitor present Nurse Communication: Mobility status PT Visit Diagnosis: Unsteadiness on feet (R26.81);Pain;Difficulty in walking, not elsewhere classified (R26.2) Pain - Right/Left: Right Pain - part of body: Hip    Time: [id number] PT Time Calculation (min) (ACUTE ONLY): 32 min   Charges:   PT Evaluation $PT Eval Low Complexity: 1 Low PT Treatments $Gait Training: 8-22 mins        [place] Office [phone] Weekend [id number]   Claretha Cooper 02/22/2023, 3:36 PM

## 2023-02-22 NOTE — Op Note (Signed)
OPERATIVE REPORT  SURGEON: Rod Can, MD   ASSISTANT: Larene Pickett, PA-C.  PREOPERATIVE DIAGNOSIS: Right hip arthritis.   POSTOPERATIVE DIAGNOSIS: Right hip arthritis.   PROCEDURE: Right total hip arthroplasty, anterior approach.   IMPLANTS: Biomet Taperloc Complete Microplasty stem, size 17 x 119 mm, high offset. Biomet G7 OsseoTi Cup, size 58 mm. Biomet Vivacit-E liner, size 36 mm, G,  neutral. Biomet Biolox ceramic head ball, size 36 - 3 mm.  ANESTHESIA:  General  ESTIMATED BLOOD LOSS:-300 mL    ANTIBIOTICS: 2 g Ancef.  DRAINS: None.  COMPLICATIONS: None.   CONDITION: PACU - hemodynamically stable.   BRIEF CLINICAL NOTE: Mike Shepard is a 57 y.o. male with a long-standing history of Right hip arthritis. After failing conservative management, the patient was indicated for total hip arthroplasty. The risks, benefits, and alternatives to the procedure were explained, and the patient elected to proceed.  PROCEDURE IN DETAIL: Surgical site was marked by myself in the pre-op holding area. Once inside the operating room, spinal anesthesia was obtained, and a foley catheter was inserted. The patient was then positioned on the Hana table.  All bony prominences were well padded.  The hip was prepped and draped in the normal sterile surgical fashion.  A time-out was called verifying side and site of surgery. The patient received IV antibiotics within 60 minutes of beginning the procedure.   Bikini incision was made, and superficial dissection was performed lateral to the ASIS. The direct anterior approach to the hip was performed through the Hueter interval.  Lateral femoral circumflex vessels were treated with the Auqumantys. The anterior capsule was exposed and an inverted T capsulotomy was made. The femoral neck cut was made to the level of the templated cut.  A corkscrew was placed into the head and the head was removed.  The femoral head was found to have eburnated  bone. The head was passed to the back table and was measured. Pubofemoral ligament was released off of the calcar, taking care to stay on bone. Superior capsule was released from the greater trochanter, taking care to stay lateral to the posterior border of the femoral neck in order to preserve the short external rotators.   Acetabular exposure was achieved, and the pulvinar and labrum were excised. Sequential reaming of the acetabulum was then performed up to a size 57 mm reamer. A 58 mm cup was then opened and impacted into place at approximately 40 degrees of abduction and 20 degrees of anteversion. The final polyethylene liner was impacted into place and acetabular osteophytes were removed.    I then gained femoral exposure taking care to protect the abductors and greater trochanter.  This was performed using standard external rotation, extension, and adduction.  A cookie cutter was used to enter the femoral canal, and then the femoral canal finder was placed.  Sequential broaching was performed up to a size 17.  Calcar planer was used on the femoral neck remnant.  I placed a high offset neck and a trial head ball.  The hip was reduced.  Leg lengths and offset were checked fluoroscopically.  The hip was dislocated and trial components were removed.  The final implants were placed, and the hip was reduced.  Fluoroscopy was used to confirm component position and leg lengths.  At 90 degrees of external rotation and full extension, the hip was stable to an anterior directed force.   The wound was copiously irrigated with Prontosan solution and normal saline using pulse lavage.  Marcaine solution was injected into the periarticular soft tissue.  The wound was closed in layers using #1 Vicryl and V-Loc for the fascia, 2-0 Vicryl for the subcutaneous fat, 2-0 Monocryl for the deep dermal layer, 3-0 running Monocryl subcuticular stitch, and Dermabond for the skin.  Once the glue was fully dried, an Aquacell Ag  dressing was applied.  The patient was transported to the recovery room in stable condition.  Sponge, needle, and instrument counts were correct at the end of the case x2.  The patient tolerated the procedure well and there were no known complications.  Please note that a surgical assistant was a medical necessity for this procedure to perform it in a safe and expeditious manner. Assistant was necessary to provide appropriate retraction of vital neurovascular structures, to prevent femoral fracture, and to allow for anatomic placement of the prosthesis.

## 2023-02-22 NOTE — Transfer of Care (Signed)
Immediate Anesthesia Transfer of Care Note  Patient: Mike Shepard  Procedure(s) Performed: TOTAL HIP ARTHROPLASTY ANTERIOR APPROACH (Right: Hip)  Patient Location: PACU  Anesthesia Type:General  Level of Consciousness: awake, alert , and oriented  Airway & Oxygen Therapy: Patient Spontanous Breathing and Patient connected to face mask oxygen  Post-op Assessment: Report given to RN and Post -op Vital signs reviewed and stable  Post vital signs: Reviewed and stable  Last Vitals:  Vitals Value Taken Time  BP 115/64 02/22/23 0941  Temp    Pulse 64 02/22/23 0944  Resp 14 02/22/23 0944  SpO2 100 % 02/22/23 0944  Vitals shown include unvalidated device data.  Last Pain:  Vitals:   02/22/23 0541  TempSrc: Oral  PainSc: 4       Patients Stated Pain Goal: 3 ([id number])  Complications: No notable events documented.

## 2023-02-23 ENCOUNTER — Encounter (HOSPITAL_COMMUNITY): Payer: Self-pay | Admitting: Orthopedic Surgery

## 2023-02-23 DIAGNOSIS — M1611 Unilateral primary osteoarthritis, right hip: Secondary | ICD-10-CM | POA: Diagnosis not present

## 2023-02-23 LAB — CBC
HCT: 35.1 % — ABNORMAL LOW (ref 39.0–52.0)
Hemoglobin: 11.1 g/dL — ABNORMAL LOW (ref 13.0–17.0)
MCH: 28 pg (ref 26.0–34.0)
MCHC: 31.6 g/dL (ref 30.0–36.0)
MCV: 88.6 fL (ref 80.0–100.0)
Platelets: 225 10*3/uL (ref 150–400)
RBC: 3.96 MIL/uL — ABNORMAL LOW (ref 4.22–5.81)
RDW: 12.5 % (ref 11.5–15.5)
WBC: 14.1 10*3/uL — ABNORMAL HIGH (ref 4.0–10.5)
nRBC: 0 % (ref 0.0–0.2)

## 2023-02-23 LAB — BASIC METABOLIC PANEL
Anion gap: 9 (ref 5–15)
BUN: 18 mg/dL (ref 6–20)
CO2: 21 mmol/L — ABNORMAL LOW (ref 22–32)
Calcium: 8.5 mg/dL — ABNORMAL LOW (ref 8.9–10.3)
Chloride: 104 mmol/L (ref 98–111)
Creatinine, Ser: 1.15 mg/dL (ref 0.61–1.24)
GFR, Estimated: >60 mL/min (ref >60)
Glucose, Bld: 136 mg/dL — ABNORMAL HIGH (ref 70–99)
Potassium: 4.3 mmol/L (ref 3.5–5.1)
Sodium: 134 mmol/L — ABNORMAL LOW (ref 135–145)

## 2023-02-23 MED ORDER — METHOCARBAMOL 500 MG PO TABS: 500.0000 mg | ORAL_TABLET | Freq: Four times a day (QID) | ORAL | 0 refills | Status: DC | PRN

## 2023-02-23 MED ORDER — SENNA 8.6 MG PO TABS: 2.0000 | ORAL_TABLET | Freq: Every day | ORAL | 0 refills | Status: AC

## 2023-02-23 MED ORDER — HYDROCODONE-ACETAMINOPHEN 10-325 MG PO TABS: 0.5000 | ORAL_TABLET | ORAL | 0 refills | Status: DC | PRN

## 2023-02-23 MED ORDER — DOCUSATE SODIUM 100 MG PO CAPS: 100.0000 mg | ORAL_CAPSULE | Freq: Two times a day (BID) | ORAL | 0 refills | Status: AC

## 2023-02-23 MED ORDER — POLYETHYLENE GLYCOL 3350 17 G PO PACK: 17.0000 g | PACK | Freq: Every day | ORAL | 0 refills | Status: AC | PRN

## 2023-02-23 MED ORDER — ASPIRIN 81 MG PO CHEW: 81.0000 mg | CHEWABLE_TABLET | Freq: Two times a day (BID) | ORAL | 0 refills | Status: AC

## 2023-02-23 MED ORDER — ONDANSETRON HCL 4 MG PO TABS: 4.0000 mg | ORAL_TABLET | Freq: Three times a day (TID) | ORAL | 0 refills | Status: DC | PRN

## 2023-02-23 NOTE — Progress Notes (Signed)
Physical Therapy Treatment Patient Details Name: Mike Shepard MRN: [id number] DOB: 10-14-1966 Today's Date: 02/23/2023   History of Present Illness 57  yo male, S/P R THA, direct anterior on 02/22/23. PMH:HTN, CEA, CVA, neck and back surgery,  gout.    PT Comments    Pt is progressing well with mobility and is ready to DC home from a PT standpoint. He ambulated 100' with RW, completed stair training and demonstrates good understanding of HEP.    Recommendations for follow up therapy are one component of a multi-disciplinary discharge planning process, led by the attending physician.  Recommendations may be updated based on patient status, additional functional criteria and insurance authorization.  Follow Up Recommendations  Follow physician's recommendations for discharge plan and follow up therapies     Assistance Recommended at Discharge Set up Supervision/Assistance  Patient can return home with the following A little help with walking and/or transfers;A little help with bathing/dressing/bathroom;Assistance with cooking/housework;Assist for transportation;Help with stairs or ramp for entrance   Equipment Recommendations  Rolling walker (2 wheels);BSC/3in1    Recommendations for Other Services       Precautions / Restrictions Precautions Precautions: Fall Restrictions Weight Bearing Restrictions: No     Mobility  Bed Mobility Overal bed mobility: Needs Assistance Bed Mobility: Supine to Sit     Supine to sit: Min assist     General bed mobility comments: min A to raise trunk, used belt to advance RLE    Transfers Overall transfer level: Needs assistance Equipment used: Rolling walker (2 wheels) Transfers: Sit to/from Stand Sit to Stand: Supervision           General transfer comment: cues for hand and  right leg position    Ambulation/Gait Ambulation/Gait assistance: Supervision Gait Distance (Feet): 100 Feet Assistive device: Rolling walker (2  wheels) Gait Pattern/deviations: Step-to pattern, Step-through pattern, Decreased step length - right Gait velocity: decr     General Gait Details: steady, no loss of balance, no dizziness   Stairs Stairs: Yes Stairs assistance: Min assist Stair Management: No rails, Forwards, With walker, Step to pattern Number of Stairs: 1 General stair comments: wife present and assisted with steadying RW   Wheelchair Mobility    Modified Rankin (Stroke Patients Only)       Balance Overall balance assessment: Needs assistance Sitting-balance support: No upper extremity supported, Feet supported Sitting balance-Leahy Scale: Good     Standing balance support: Bilateral upper extremity supported, Reliant on assistive device for balance, During functional activity Standing balance-Leahy Scale: Fair                              Cognition Arousal/Alertness: Awake/alert Behavior During Therapy: WFL for tasks assessed/performed Overall Cognitive Status: Within Functional Limits for tasks assessed                                          Exercises Total Joint Exercises Ankle Circles/Pumps: AROM, Both, 10 reps, Supine Quad Sets: AROM, Both, 5 reps, Supine Short Arc Quad: AROM, Right, 5 reps, Supine Heel Slides: AAROM, Right, 5 reps, Supine Hip ABduction/ADduction: AAROM, Right, 5 reps, Supine Long Arc Quad: AROM, Right, 5 reps, Seated    General Comments        Pertinent Vitals/Pain Pain Assessment Pain Score: 3  Pain Location: right hip Pain Descriptors / Indicators: Grimacing,  Discomfort Pain Intervention(s): Limited activity within patient's tolerance, Monitored during session, Premedicated before session, Ice applied    Home Living                          Prior Function            PT Goals (current goals can now be found in the care plan section) Acute Rehab PT Goals Patient Stated Goal: go home, 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 [place] PT Goal Formulation: With patient/family Time For Goal Achievement: 03/01/23 Potential to Achieve Goals: Good Progress towards PT goals: Progressing toward goals;Goals met/education completed, patient discharged from PT    Frequency    7X/week      PT Plan Current plan remains appropriate    Co-evaluation              AM-PAC PT "6 Clicks" Mobility   Outcome Measure  Help needed turning from your back to your side while in a flat bed without using bedrails?: None Help needed moving from lying on your back to sitting on the side of a flat bed without using bedrails?: A Little Help needed moving to and from a bed to a chair (including a wheelchair)?: None Help needed standing up from a chair using your arms (e.g., wheelchair or bedside chair)?: None Help needed to walk in hospital room?: None Help needed climbing 3-5 steps with a railing? : A Little 6 Click Score: 22    End of Session Equipment Utilized During Treatment: Gait belt Activity Tolerance: Treatment limited secondary to medical complications (Comment) (hypotensive) Patient left: in chair;with call bell/phone within reach;with family/visitor present Nurse Communication: Mobility status PT Visit Diagnosis: Unsteadiness on feet (R26.81);Pain;Difficulty in walking, not elsewhere classified (R26.2) Pain - Right/Left: Right Pain - part of body: Hip     Time: 0915-1000 PT Time Calculation (min) (ACUTE ONLY): 45 min  Charges:  $Gait Training: 8-22 mins $Therapeutic Exercise: 8-22 mins $Therapeutic Activity: 8-22 mins                    Blondell Reveal Kistler PT 02/23/2023  Acute Rehabilitation Services  Office [phone]

## 2023-02-23 NOTE — Discharge Summary (Signed)
Physician Discharge Summary  Patient ID: Mike Shepard MRN: [id number] DOB/AGE: 57-Oct-1967 57 y.o.  Admit date: 02/22/2023 Discharge date: 02/23/2023  Admission Diagnoses:  Osteoarthritis of right hip  Discharge Diagnoses:  Principal Problem:   Osteoarthritis of right hip   Past Medical History:  Diagnosis Date   Aortic atherosclerosis ([place])    Carotid artery stenosis    s/p RCEA now with occluded RICA and 123456 LICA stenosis by dopplers 10/2020   Chronic back pain    see surgeries    GERD (gastroesophageal reflux disease)    H/O: gout    Hyperlipidemia    Hypertension    PPD positive    Positive PPD, remotely ~2000, s/p Rx     Pre-diabetes    Primary osteoarthritis of right hip    end stage   Sleep apnea    no machine yet   Stroke [facility])    " mild"   Wears dentures     Surgeries: Procedure(s): TOTAL HIP ARTHROPLASTY ANTERIOR APPROACH on 02/22/2023   Consultants (if any):   Discharged Condition: Improved  Hospital Course: Mike Shepard is an 57 y.o. male who was admitted 02/22/2023 with a diagnosis of Osteoarthritis of right hip and went to the operating room on 02/22/2023 and underwent the above named procedures.    He was given perioperative antibiotics:  Anti-infectives (From admission, onward)    Start     Dose/Rate Route Frequency Ordered Stop   02/22/23 1500  ceFAZolin (ANCEF) IVPB 2g/100 mL premix        2 g 200 mL/hr over 30 Minutes Intravenous Every 6 hours 02/22/23 1400 02/22/23 2220   02/22/23 0600  ceFAZolin (ANCEF) IVPB 2g/100 mL premix        2 g 200 mL/hr over 30 Minutes Intravenous On call to O.R. 02/22/23 0536 02/22/23 0737       He was given sequential compression devices, early ambulation, and aspirin for DVT prophylaxis.  Patient ambulated 40 feet with PT postoperative day, but had decrease in BP.  POD#1 patient ambulated 100 feet with PT without any signs of orthostasis. Discharged home with HEP.   He benefited maximally from the  hospital stay and there were no complications.    Recent vital signs:  Vitals:   02/23/23 0400 02/23/23 0852  BP: 130/71 (!) 128/59  Pulse: (!) 55 63  Resp: 16 18  Temp: 97.9 F (36.6 C) 98.6 F (37 C)  SpO2: 100% 98%    Recent laboratory studies:  Lab Results  Component Value Date   HGB 11.1 (L) 02/23/2023   HGB 14.4 02/21/2023   HGB 14.8 02/09/2023   Lab Results  Component Value Date   WBC 14.1 (H) 02/23/2023   PLT 225 02/23/2023   Lab Results  Component Value Date   INR 1.0 06/02/2019   Lab Results  Component Value Date   NA 134 (L) 02/23/2023   K 4.3 02/23/2023   CL 104 02/23/2023   CO2 21 (L) 02/23/2023   BUN 18 02/23/2023   CREATININE 1.15 02/23/2023   GLUCOSE 136 (H) 02/23/2023     Allergies as of 02/23/2023   No Known Allergies      Medication List     STOP taking these medications    aspirin EC 81 MG tablet Replaced by: aspirin 81 MG chewable tablet       TAKE these medications    amLODipine 10 MG tablet Commonly known as: NORVASC Take 1 tablet (10 mg total) by mouth  daily.   aspirin 81 MG chewable tablet Commonly known as: Aspirin Childrens Chew 1 tablet (81 mg total) by mouth 2 (two) times daily with a meal. Replaces: aspirin EC 81 MG tablet   atorvastatin 80 MG tablet Commonly known as: LIPITOR Take 1 tablet (80 mg total) by mouth daily.   buprenorphine 8 MG Subl SL tablet Commonly known as: SUBUTEX Place 8 mg under the tongue daily. What changed: Another medication with the same name was removed. Continue taking this medication, and follow the directions you see here.   carvedilol 25 MG tablet Commonly known as: COREG Take 1 tablet (25 mg total) by mouth 2 (two) times daily with a meal.   clopidogrel 75 MG tablet Commonly known as: PLAVIX Take 1 tablet by mouth once daily   docusate sodium 100 MG capsule Commonly known as: Colace Take 1 capsule (100 mg total) by mouth 2 (two) times daily.   ezetimibe 10 MG  tablet Commonly known as: Zetia Take 1 tablet (10 mg total) by mouth daily.   fluticasone 50 MCG/ACT nasal spray Commonly known as: FLONASE Place 1 spray into both nostrils daily as needed for allergies or rhinitis.   hydrALAZINE 100 MG tablet Commonly known as: APRESOLINE TAKE 1 TABLET BY MOUTH THREE TIMES DAILY   HYDROcodone-acetaminophen 10-325 MG tablet Commonly known as: Norco Take 0.5 tablets by mouth every 4 (four) hours as needed for moderate pain or severe pain.   methocarbamol 500 MG tablet Commonly known as: ROBAXIN Take 1 tablet (500 mg total) by mouth every 6 (six) hours as needed for muscle spasms.   multivitamin with minerals Tabs tablet Take 1 tablet by mouth daily.   ondansetron 4 MG tablet Commonly known as: Zofran Take 1 tablet (4 mg total) by mouth every 8 (eight) hours as needed for nausea or vomiting.   polyethylene glycol 17 g packet Commonly known as: MiraLax Take 17 g by mouth daily as needed for mild constipation or moderate constipation.   senna 8.6 MG Tabs tablet Commonly known as: SENOKOT Take 2 tablets (17.2 mg total) by mouth at bedtime for 15 days.   valsartan 320 MG tablet Commonly known as: DIOVAN Take 1 tablet (320 mg total) by mouth daily.               Discharge Care Instructions  (From admission, onward)           Start     Ordered   02/23/23 0000  Weight bearing as tolerated        02/23/23 1015   02/23/23 0000  Change dressing       Comments: Do not change your dressing.   02/23/23 1015              WEIGHT BEARING   Weight bearing as tolerated with assist device (walker, cane, etc) as directed, use it as long as suggested by your surgeon or therapist, typically at least 4-6 weeks.   EXERCISES  Results after joint replacement surgery are often greatly improved when you follow the exercise, range of motion and muscle strengthening exercises prescribed by your doctor. Safety measures are also important to  protect the joint from further injury. Any time any of these exercises cause you to have increased pain or swelling, decrease what you are doing until you are comfortable again and then slowly increase them. If you have problems or questions, call your caregiver or physical therapist for advice.   Rehabilitation is important following a joint replacement.  After just a few days of immobilization, the muscles of the leg can become weakened and shrink (atrophy).  These exercises are designed to build up the tone and strength of the thigh and leg muscles and to improve motion. Often times heat used for twenty to thirty minutes before working out will loosen up your tissues and help with improving the range of motion but do not use heat for the first two weeks following surgery (sometimes heat can increase post-operative swelling).   These exercises can be done on a training (exercise) mat, on the floor, on a table or on a bed. Use whatever works the best and is most comfortable for you.    Use music or television while you are exercising so that the exercises are a pleasant break in your day. This will make your life better with the exercises acting as a break in your routine that you can look forward to.   Perform all exercises about fifteen times, three times per day or as directed.  You should exercise both the operative leg and the other leg as well.  Exercises include:   Quad Sets - Tighten up the muscle on the front of the thigh (Quad) and hold for 5-10 seconds.   Straight Leg Raises - With your knee straight (if you were given a brace, keep it on), lift the leg to 60 degrees, hold for 3 seconds, and slowly lower the leg.  Perform this exercise against resistance later as your leg gets stronger.  Leg Slides: Lying on your back, slowly slide your foot toward your buttocks, bending your knee up off the floor (only go as far as is comfortable). Then slowly slide your foot back down until your leg is flat on  the floor again.  Angel Wings: Lying on your back spread your legs to the side as far apart as you can without causing discomfort.  Hamstring Strength:  Lying on your back, push your heel against the floor with your leg straight by tightening up the muscles of your buttocks.  Repeat, but this time bend your knee to a comfortable angle, and push your heel against the floor.  You may put a pillow under the heel to make it more comfortable if necessary.   A rehabilitation program following joint replacement surgery can speed recovery and prevent re-injury in the future due to weakened muscles. Contact your doctor or a physical therapist for more information on knee rehabilitation.    CONSTIPATION  Constipation is defined medically as fewer than three stools per week and severe constipation as less than one stool per week.  Even if you have a regular bowel pattern at home, your normal regimen is likely to be disrupted due to multiple reasons following surgery.  Combination of anesthesia, postoperative narcotics, change in appetite and fluid intake all can affect your bowels.   YOU MUST use at least one of the following options; they are listed in order of increasing strength to get the job done.  They are all available over the counter, and you may need to use some, POSSIBLY even all of these options:    Drink plenty of fluids (prune juice may be helpful) and high fiber foods Colace 100 mg by mouth twice a day  Senokot for constipation as directed and as needed Dulcolax (bisacodyl), take with full glass of water  Miralax (polyethylene glycol) once or twice a day as needed.  If you have tried all these things and are unable to  have a bowel movement in the first 3-4 days after surgery call either your surgeon or your primary doctor.    If you experience loose stools or diarrhea, hold the medications until you stool forms back up.  If your symptoms do not get better within 1 week or if they get worse,  check with your doctor.  If you experience "the worst abdominal pain ever" or develop nausea or vomiting, please contact the office immediately for further recommendations for treatment.   ITCHING:  If you experience itching with your medications, try taking only a single pain pill, or even half a pain pill at a time.  You can also use Benadryl over the counter for itching or also to help with sleep.   TED HOSE STOCKINGS:  Use stockings on both legs until for at least 2 weeks or as directed by physician office. They may be removed at night for sleeping.  MEDICATIONS:  See your medication summary on the "After Visit Summary" that nursing will review with you.  You may have some home medications which will be placed on hold until you complete the course of blood thinner medication.  It is important for you to complete the blood thinner medication as prescribed.  PRECAUTIONS:  If you experience chest pain or shortness of breath - call 911 immediately for transfer to the hospital emergency department.   If you develop a fever greater that 101 F, purulent drainage from wound, increased redness or drainage from wound, foul odor from the wound/dressing, or calf pain - CONTACT YOUR SURGEON.                                                   FOLLOW-UP APPOINTMENTS:  If you do not already have a post-op appointment, please call the office for an appointment to be seen by your surgeon.  Guidelines for how soon to be seen are listed in your "After Visit Summary", but are typically between 1-4 weeks after surgery.  OTHER INSTRUCTIONS:   Knee Replacement:  Do not place pillow under knee, focus on keeping the knee straight while resting. CPM instructions: 0-90 degrees, 2 hours in the morning, 2 hours in the afternoon, and 2 hours in the evening. Place foam block, curve side up under heel at all times except when in CPM or when walking.  DO NOT modify, tear, cut, or change the foam block in any way.   MAKE SURE  YOU:  Understand these instructions.  Get help right away if you are not doing well or get worse.    Thank you for letting us be a part of your medical care team.  It is a privilege we respect greatly.  We hope these instructions will help you stay on track for a fast and full recovery!   Diagnostic Studies: DG Pelvis Portable  Result Date: 02/22/2023 CLINICAL DATA:  Status post right hip replacement EXAM: PORTABLE PELVIS 1 VIEWS COMPARISON:  None Available. FINDINGS: Interval postsurgical changes from right total hip arthroplasty. Arthroplasty components appear in their expected alignment. No periprosthetic fracture is identified. Expected postoperative changes within the overlying soft tissues. IMPRESSION: Expected postsurgical changes from right total hip arthroplasty. Electronically Signed   By: Yetta Glassman M.D.   On: 02/22/2023 10:03   DG HIP PORT UNILAT WITH PELVIS 1V RIGHT  Result Date: 02/22/2023 CLINICAL  DATA:  Right anterior hip arthroplasty. EXAM: OPERATIVE RIGHT HIP (WITH PELVIS IF PERFORMED) 1 VIEWS TECHNIQUE: Fluoroscopic spot image(s) were submitted for interpretation post-operatively. COMPARISON:  None Available. FINDINGS: Intraoperative right hip arthroplasty. 8 low resolution intraoperative spot views of the right hip and pelvis were obtained. No fracture visible on the limited views. Total fluoroscopy time: 10 seconds Total radiation dose: 2.2 mGy IMPRESSION: Intraoperative imaging of right hip arthroplasty. Please see performing provider's procedural report for details. Electronically Signed   By: Ileana Roup M.D.   On: 02/22/2023 09:15   DG C-Arm 1-60 Min-No Report  Result Date: 02/22/2023 Fluoroscopy was utilized by the requesting physician.  No radiographic interpretation.   DG C-Arm 1-60 Min-No Report  Result Date: 02/22/2023 Fluoroscopy was utilized by the requesting physician.  No radiographic interpretation.   DG Chest Portable 1 View  Result Date:  02/21/2023 CLINICAL DATA:  Intermittent chest pressure for 2 days. EXAM: PORTABLE CHEST 1 VIEW COMPARISON:  Chest radiographs 02/06/2020 and CT 09/01/2022 FINDINGS: The cardiomediastinal silhouette is unchanged with normal heart size. No airspace consolidation, edema, pleural effusion, or pneumothorax is identified. No acute osseous abnormality is seen. IMPRESSION: No active disease. Electronically Signed   By: Logan Bores M.D.   On: 02/21/2023 09:23    Disposition: Discharge disposition: 01-Home or Self Care       Discharge Instructions     Call MD / Call 911   Complete by: As directed    If you experience chest pain or shortness of breath, CALL 911 and be transported to the hospital emergency room.  If you develope a fever above 101 F, pus (white drainage) or increased drainage or redness at the wound, or calf pain, call your surgeon's office.   Change dressing   Complete by: As directed    Do not change your dressing.   Constipation Prevention   Complete by: As directed    Drink plenty of fluids.  Prune juice may be helpful.  You may use a stool softener, such as Colace (over the counter) 100 mg twice a day.  Use MiraLax (over the counter) for constipation as needed.   Diet - low sodium heart healthy   Complete by: As directed    Discharge instructions   Complete by: As directed    Elevate toes above nose. Use cryotherapy as needed for pain and swelling.   Driving restrictions   Complete by: As directed    No driving for 6 weeks   Increase activity slowly as tolerated   Complete by: As directed    Lifting restrictions   Complete by: As directed    No lifting for 6 weeks   Post-operative opioid taper instructions:   Complete by: As directed    POST-OPERATIVE OPIOID TAPER INSTRUCTIONS: It is important to wean off of your opioid medication as soon as possible. If you do not need pain medication after your surgery it is ok to stop day one. Opioids include: Codeine,  Hydrocodone(Norco, Vicodin), Oxycodone(Percocet, oxycontin) and hydromorphone amongst others.  Long term and even short term use of opiods can cause: Increased pain response Dependence Constipation Depression Respiratory depression And more.  Withdrawal symptoms can include Flu like symptoms Nausea, vomiting And more Techniques to manage these symptoms Hydrate well Eat regular healthy meals Stay active Use relaxation techniques(deep breathing, meditating, yoga) Do Not substitute Alcohol to help with tapering If you have been on opioids for less than two weeks and do not have pain than it is ok  to stop all together.  Plan to wean off of opioids This plan should start within one week post op of your joint replacement. Maintain the same interval or time between taking each dose and first decrease the dose.  Cut the total daily intake of opioids by one tablet each day Next start to increase the time between doses. The last dose that should be eliminated is the evening dose.      TED hose   Complete by: As directed    Use stockings (TED hose) for 2 weeks on both leg(s).  You may remove them at night for sleeping.   Weight bearing as tolerated   Complete by: As directed         Follow-up Information     Charlott Rakes, PA-C. Schedule an appointment as soon as possible for a visit in 2 week(s).   Specialty: Orthopedic Surgery Why: For wound re-check Contact information: [address], [address] [place] [address] [id number]                  Signed: Charlott Rakes 02/23/2023, 3:37 PM

## 2023-02-23 NOTE — Progress Notes (Signed)
    Subjective:  Patient reports pain as mild to moderate.  Denies N/V/CP/SOB/Abd pain/Dizziness. He reports some mild thigh pain. Denies any tingling or numbness in LE bilaterally. He states he had some issues with his BP and PT yesterday. He is eager for discharge home.   Objective:   VITALS:   Vitals:   02/22/23 2054 02/23/23 0113 02/23/23 0400 02/23/23 0852  BP: 126/63 (!) 102/50 130/71 (!) 128/59  Pulse: 66 (!) 59 (!) 55 63  Resp: 18 16 16 18   Temp: 99.3 F (37.4 C) 98.5 F (36.9 C) 97.9 F (36.6 C) 98.6 F (37 C)  TempSrc: Oral Oral Oral Oral  SpO2: 99% 98% 100% 98%  Weight:      Height:        Patient is sitting up in bed. NAD. Neurologically intact ABD soft Neurovascular intact Sensation intact distally Intact pulses distally Dorsiflexion/Plantar flexion intact Incision: dressing C/D/I No cellulitis present Compartment soft   Lab Results  Component Value Date   WBC 14.1 (H) 02/23/2023   HGB 11.1 (L) 02/23/2023   HCT 35.1 (L) 02/23/2023   MCV 88.6 02/23/2023   PLT 225 02/23/2023   BMET    Component Value Date/Time   NA 134 (L) 02/23/2023 0335   NA 138 01/21/2021 1414   K 4.3 02/23/2023 0335   CL 104 02/23/2023 0335   CO2 21 (L) 02/23/2023 0335   GLUCOSE 136 (H) 02/23/2023 0335   BUN 18 02/23/2023 0335   BUN 11 01/21/2021 1414   CREATININE 1.15 02/23/2023 0335   CREATININE 0.91 12/15/2022 1414   CALCIUM 8.5 (L) 02/23/2023 0335   GFRNONAA >60 02/23/2023 0335     Assessment/Plan: 1 Day Post-Op   Principal Problem:   Osteoarthritis of right hip    WBAT with walker DVT ppx: Aspirin, SCDs, TEDS PO pain control PT/OT: Patient ambulated 40 feet with PT yesterday and had a drop in his BP. Continue PT today.  Dispo: D/c home once cleared with PT.    Charlott Rakes- PA-C 02/23/2023, 10:03 AM   [facility]  [place] [address], [address], [place], [place] [address] Phone: [phone] [email or web address] Facebook   [employer]

## 2023-02-23 NOTE — Plan of Care (Signed)

## 2023-02-23 NOTE — TOC Transition Note (Signed)
Transition of Care [facility]) - CM/SW Discharge Note   Patient Details  Name: Mike Shepard MRN: [id number] Date of Birth: Sep 23, 1966  [facility]) CM/SW Contact:  Lennart Pall, LCSW Phone Number: 02/23/2023, 11:37 AM   Clinical Narrative:     Pt for dc today and has received RW to room via [facility].  Plan for HEP.  No further TOC needs.  Final next level of care: Home/Self Care Barriers to Discharge: No Barriers Identified   Patient Goals and CMS Choice      Discharge Placement                         Discharge Plan and Services Additional resources added to the After Visit Summary for                  DME Arranged: Walker rolling DME Agency: [facility]                  Social Determinants of Health (SDOH) Interventions SDOH Screenings   Food Insecurity: No Food Insecurity (02/22/2023)  Housing: Low Risk  (02/22/2023)  Transportation Needs: No Transportation Needs (02/22/2023)  Utilities: Not At Risk (02/22/2023)  Depression (PHQ2-9): Low Risk  (12/15/2022)  Tobacco Use: High Risk (02/23/2023)     Readmission Risk Interventions     No data to display

## 2023-03-07 ENCOUNTER — Other Ambulatory Visit: Payer: Self-pay | Admitting: Internal Medicine

## 2023-03-07 NOTE — Addendum Note (Signed)
Addended by: Freada Bergeron on: 03/07/2023 12:47 PM   Modules accepted: Orders

## 2023-03-07 NOTE — Telephone Encounter (Signed)
Per Dr Radford Pax order a NPSG. Order has been placed for NPSG.

## 2023-03-27 ENCOUNTER — Encounter: Payer: Self-pay | Admitting: Internal Medicine

## 2023-04-02 ENCOUNTER — Other Ambulatory Visit: Payer: Self-pay | Admitting: Cardiology

## 2023-04-08 ENCOUNTER — Emergency Department (HOSPITAL_COMMUNITY): Payer: 59

## 2023-04-08 ENCOUNTER — Other Ambulatory Visit: Payer: Self-pay

## 2023-04-08 ENCOUNTER — Encounter (HOSPITAL_COMMUNITY): Payer: Self-pay | Admitting: *Deleted

## 2023-04-08 ENCOUNTER — Emergency Department (HOSPITAL_COMMUNITY)
Admission: EM | Admit: 2023-04-08 | Discharge: 2023-04-08 | Disposition: A | Discharge status: Home / Self Care | Payer: 59 | Attending: Emergency Medicine | Admitting: Emergency Medicine

## 2023-04-08 DIAGNOSIS — Z79899 Other long term (current) drug therapy: Secondary | ICD-10-CM | POA: Diagnosis not present

## 2023-04-08 DIAGNOSIS — Z7982 Long term (current) use of aspirin: Secondary | ICD-10-CM | POA: Diagnosis not present

## 2023-04-08 DIAGNOSIS — R002 Palpitations: Secondary | ICD-10-CM

## 2023-04-08 LAB — CBC WITH DIFFERENTIAL/PLATELET
Abs Immature Granulocytes: 0.02 10*3/uL (ref 0.00–0.07)
Basophils Absolute: 0 10*3/uL (ref 0.0–0.1)
Basophils Relative: 1 %
Eosinophils Absolute: 0.2 10*3/uL (ref 0.0–0.5)
Eosinophils Relative: 3 %
HCT: 39.9 % (ref 39.0–52.0)
Hemoglobin: 12.8 g/dL — ABNORMAL LOW (ref 13.0–17.0)
Immature Granulocytes: 0 %
Lymphocytes Relative: 24 %
Lymphs Abs: 1.7 10*3/uL (ref 0.7–4.0)
MCH: 27.5 pg (ref 26.0–34.0)
MCHC: 32.1 g/dL (ref 30.0–36.0)
MCV: 85.6 fL (ref 80.0–100.0)
Monocytes Absolute: 0.5 10*3/uL (ref 0.1–1.0)
Monocytes Relative: 7 %
Neutro Abs: 4.4 10*3/uL (ref 1.7–7.7)
Neutrophils Relative %: 65 %
Platelets: 285 10*3/uL (ref 150–400)
RBC: 4.66 MIL/uL (ref 4.22–5.81)
RDW: 12.2 % (ref 11.5–15.5)
WBC: 6.8 10*3/uL (ref 4.0–10.5)
nRBC: 0 % (ref 0.0–0.2)

## 2023-04-08 LAB — BASIC METABOLIC PANEL
Anion gap: 11 (ref 5–15)
BUN: 6 mg/dL (ref 6–20)
CO2: 23 mmol/L (ref 22–32)
Calcium: 9.3 mg/dL (ref 8.9–10.3)
Chloride: 103 mmol/L (ref 98–111)
Creatinine, Ser: 0.95 mg/dL (ref 0.61–1.24)
GFR, Estimated: >60 mL/min (ref >60)
Glucose, Bld: 123 mg/dL — ABNORMAL HIGH (ref 70–99)
Potassium: 4.3 mmol/L (ref 3.5–5.1)
Sodium: 137 mmol/L (ref 135–145)

## 2023-04-08 LAB — TROPONIN I (HIGH SENSITIVITY)
Troponin I (High Sensitivity): 4 ng/L (ref <18)
Troponin I (High Sensitivity): 6 ng/L (ref <18)

## 2023-04-08 NOTE — ED Notes (Signed)
DC instructions reviewed with pt. Pt verbalized understanding.  Pt DC 

## 2023-04-08 NOTE — ED Notes (Signed)
Patient transported to X-ray 

## 2023-04-08 NOTE — Discharge Instructions (Signed)
Return for any problem.  ?

## 2023-04-08 NOTE — ED Triage Notes (Signed)
Patient presents to ed via [facility] from home states  4-5 months ago he experienced chest palpitations[facility] and stated labs were [facility]. Woke at 4am today with different feeling in his chest  states he took 324 mg ASA  this am. States when he woke up this am he was having left shoulder pain of which lasted approx. 10 mins when went away. No pain at present.

## 2023-04-08 NOTE — ED Provider Notes (Signed)
[place] EMERGENCY DEPARTMENT AT [facility] Provider Note   CSN: [id number] Arrival date & time: 04/08/23  1029     History  Chief Complaint  Patient presents with   Palpitations    Mike Shepard is a 57 y.o. male.  57 year old male with prior medical history as detailed below presents for evaluation.  Patient reports that this a.m. he experienced palpitations.  He says that around 5 AM he reports that his heartbeat was irregular.  He felt like his "heart was jumping around".  He complains of some vague substernal discomfort with this.  Both the palpitations and chest discomfort have completely resolved at time of evaluation.  Patient did take aspirin prior to arrival.  Patient denies associated fever, shortness of breath, nausea, vomiting, other complaint.    The history is provided by the patient and medical records.       Home Medications Prior to Admission medications   Medication Sig Start Date End Date Taking? Authorizing Provider  amLODipine (NORVASC) 10 MG tablet Take 1 tablet (10 mg total) by mouth daily. 01/09/23   Quintella Reichert, MD  aspirin (ASPIRIN CHILDRENS) 81 MG chewable tablet Chew 1 tablet (81 mg total) by mouth 2 (two) times daily with a meal. 02/23/23 04/09/23  Hill, Alain Honey, PA-C  atorvastatin (LIPITOR) 80 MG tablet Take 1 tablet (80 mg total) by mouth daily. 03/23/22   Wanda Plump, MD  buprenorphine (SUBUTEX) 8 MG SUBL SL tablet Place 8 mg under the tongue daily.    [provider]  carvedilol (COREG) 25 MG tablet Take 1 tablet (25 mg total) by mouth 2 (two) times daily with a meal. 01/09/23   Turner, Cornelious Bryant, MD  clopidogrel (PLAVIX) 75 MG tablet Take 1 tablet (75 mg total) by mouth daily. 03/07/23   Wanda Plump, MD  ezetimibe (ZETIA) 10 MG tablet Take 1 tablet (10 mg total) by mouth daily. 06/12/22   Wanda Plump, MD  fluticasone [facility]) 50 MCG/ACT nasal spray Place 1 spray into both nostrils daily as needed for allergies or  rhinitis.    [provider]  hydrALAZINE (APRESOLINE) 100 MG tablet TAKE 1 TABLET BY MOUTH THREE TIMES DAILY 12/12/22   Quintella Reichert, MD  HYDROcodone-acetaminophen (NORCO) 10-325 MG tablet Take 0.5 tablets by mouth every 4 (four) hours as needed for moderate pain or severe pain. 02/23/23   Hill, Alain Honey, PA-C  methocarbamol (ROBAXIN) 500 MG tablet Take 1 tablet (500 mg total) by mouth every 6 (six) hours as needed for muscle spasms. 02/23/23   Clois Dupes, PA-C  Multiple Vitamin (MULTIVITAMIN WITH MINERALS) TABS Take 1 tablet by mouth daily.    [provider]  ondansetron (ZOFRAN) 4 MG tablet Take 1 tablet (4 mg total) by mouth every 8 (eight) hours as needed for nausea or vomiting. 02/23/23 02/23/24  Clois Dupes, PA-C  valsartan (DIOVAN) 320 MG tablet Take 1 tablet (320 mg total) by mouth daily. 11/30/22   Quintella Reichert, MD      Allergies    Patient has no known allergies.    Review of Systems   Review of Systems  All other systems reviewed and are negative.   Physical Exam Updated Vital Signs Ht 6' (1.829 m)   Wt 108.9 kg   BMI 32.55 kg/m  Physical Exam Vitals and nursing note reviewed.  Constitutional:      General: He is not in acute distress.    Appearance: Normal  appearance. He is well-developed.  HENT:     Head: Normocephalic and atraumatic.  Eyes:     Conjunctiva/sclera: Conjunctivae normal.     Pupils: Pupils are equal, round, and reactive to light.  Cardiovascular:     Rate and Rhythm: Normal rate and regular rhythm.     Heart sounds: Normal heart sounds.  Pulmonary:     Effort: Pulmonary effort is normal. No respiratory distress.     Breath sounds: Normal breath sounds.  Abdominal:     General: There is no distension.     Palpations: Abdomen is soft.     Tenderness: There is no abdominal tenderness.  Musculoskeletal:        General: No deformity. Normal range of motion.     Cervical back: Normal range of motion and neck supple.  Skin:     General: Skin is warm and dry.  Neurological:     General: No focal deficit present.     Mental Status: He is alert and oriented to person, place, and time.     ED Results / Procedures / Treatments   Labs (all labs ordered are listed, but only abnormal results are displayed) Labs Reviewed  CBC WITH DIFFERENTIAL/PLATELET - Abnormal; Notable for the following components:      Result Value   Hemoglobin 12.8 (*)    All other components within normal limits  BASIC METABOLIC PANEL - Abnormal; Notable for the following components:   Glucose, Bld 123 (*)    All other components within normal limits  TROPONIN I (HIGH SENSITIVITY)  TROPONIN I (HIGH SENSITIVITY)    EKG EKG Interpretation  Date/Time:  Sunday April 08 2023 10:23:21 EDT Ventricular Rate:  74 PR Interval:  148 QRS Duration: 98 QT Interval:  384 QTC Calculation: 426 R Axis:   28 Text Interpretation: Normal sinus rhythm Normal ECG When compared with ECG of 02-Jun-2019 07:45, PREVIOUS ECG IS PRESENT Confirmed by Kristine Royal [phone]) on 04/08/2023 10:39:17 AM  Radiology No results found.  Procedures Procedures    Medications Ordered in ED Medications - No data to display  ED Course/ Medical Decision Making/ A&P                             Medical Decision Making Amount and/or Complexity of Data Reviewed Labs: ordered. Radiology: ordered.    Medical Screen Complete  This patient presented to the ED with complaint of palpitations.  This complaint involves an extensive number of treatment options. The initial differential diagnosis includes, but is not limited to, arrhythmia, ACS, metabolic abnormality, etc.  This presentation is: Acute, Chronic, Self-Limited, Previously Undiagnosed, Uncertain Prognosis, Complicated, Systemic Symptoms, and Threat to Life/Bodily Function  Patient presents with complaint of palpitations and atypical chest discomfort.  Described symptoms are not consistent with likely ACS.     However patient does have established cardiac history.  EKG is without significant abnormality.  No acute ischemia suggested.  Initial troponin is 4.  Other screening labs are without significant abnormality.  Patient declines to wait for second troponin result.    Patient has been completely comfortable during the entirety of his ED evaluation.  Patient with plan to follow-up closely with his cardiologist tomorrow.  Patient does understand that not waiting for second troponin exposes him to some risk of possible undiagnosed cardiac issue.  Undiagnosed cardiac disease could result in his early death.  Patient is comfortable with this risk.  He has capacity to  refuse care.  Patient advised that if his symptoms worsen or return he should absolutely seek medical attention.  Importance of close follow-up is stressed.  Strict return precautions given and understood.  Additional history obtained:  Additional history obtained from [facility] External records from outside sources obtained and reviewed including prior ED visits and prior Inpatient records.    Lab Tests:  I ordered and personally interpreted labs.  The pertinent results include: CBC, BMP, troponin   Imaging Studies ordered:  I ordered imaging studies including chest x-ray I independently visualized and interpreted obtained imaging which showed NAD I agree with the radiologist interpretation.   Cardiac Monitoring:  The patient was maintained on a cardiac monitor.  I personally viewed and interpreted the cardiac monitor which showed an underlying rhythm of: NSR Problem List / ED Course:  Palpitations   Reevaluation:  After the interventions noted above, I reevaluated the patient and found that they have: resolved  Disposition:  After consideration of the diagnostic results and the patients response to treatment, I feel that the patent would benefit from admission.          Final Clinical Impression(s) / ED  Diagnoses Final diagnoses:  Palpitations    Rx / DC Orders ED Discharge Orders     None         Wynetta Fines, MD 04/08/23 1616

## 2023-04-19 NOTE — Telephone Encounter (Signed)
Prior Authorization for NPSG sent to [facility] via web portal. Tracking Number .   NOT COVERED/NOT APPROVED

## 2023-05-02 ENCOUNTER — Ambulatory Visit (INDEPENDENT_AMBULATORY_CARE_PROVIDER_SITE_OTHER): Payer: 59

## 2023-05-02 ENCOUNTER — Ambulatory Visit: Payer: 59 | Attending: Cardiology | Admitting: Cardiology

## 2023-05-02 ENCOUNTER — Encounter: Payer: Self-pay | Admitting: Cardiology

## 2023-05-02 VITALS — BP 152/80 | HR 65 | Ht 72.0 in | Wt 249.2 lb

## 2023-05-02 DIAGNOSIS — G4733 Obstructive sleep apnea (adult) (pediatric): Secondary | ICD-10-CM

## 2023-05-02 DIAGNOSIS — R002 Palpitations: Secondary | ICD-10-CM

## 2023-05-02 DIAGNOSIS — Z79899 Other long term (current) drug therapy: Secondary | ICD-10-CM

## 2023-05-02 DIAGNOSIS — R079 Chest pain, unspecified: Secondary | ICD-10-CM

## 2023-05-02 DIAGNOSIS — I6523 Occlusion and stenosis of bilateral carotid arteries: Secondary | ICD-10-CM

## 2023-05-02 DIAGNOSIS — I10 Essential (primary) hypertension: Secondary | ICD-10-CM | POA: Diagnosis not present

## 2023-05-02 DIAGNOSIS — E7800 Pure hypercholesterolemia, unspecified: Secondary | ICD-10-CM

## 2023-05-02 MED ORDER — SPIRONOLACTONE 25 MG PO TABS
25.0000 mg | ORAL_TABLET | Freq: Every day | ORAL | 3 refills | Status: DC
Start: 2023-05-02 — End: 2024-03-06

## 2023-05-02 NOTE — Progress Notes (Unsigned)
Enrolled for [employer] to mail a ZIO XT long term holter monitor to the patients address on file.  

## 2023-05-02 NOTE — Telephone Encounter (Addendum)
DME selection is [facility] Patient understands he will be contacted by [facility] to set up his cpap. Patient understands to call if [facility] does not contact him with new setup in a timely manner. Patient understands they will be called once confirmation has been received from [facility] that they have received their new machine to schedule 10 week follow up appointment.   [facility] notified of new cpap order  Please add to airview Patient was grateful for the call and thanked me.    

## 2023-05-02 NOTE — Addendum Note (Signed)
Addended by: Reesa Chew on: 05/02/2023 04:50 PM   Modules accepted: Orders

## 2023-05-02 NOTE — Addendum Note (Signed)
Addended by: Reesa Chew on: 05/02/2023 03:48 PM   Modules accepted: Orders

## 2023-05-02 NOTE — Addendum Note (Signed)
Addended by: Luellen Pucker on: 05/02/2023 04:15 PM   Modules accepted: Orders

## 2023-05-02 NOTE — Telephone Encounter (Signed)
Prior Authorization for HST sent to [facility] via web portal. Tracking Number .  READY-NO PA REQ

## 2023-05-02 NOTE — Progress Notes (Addendum)
Cardiology Note    Date:  05/02/2023   ID:  CORNELIO MUNCEY, DOB 1966/06/10, MRN [id number]  PCP:  Mike Plump, MD  Cardiologist:  Nanetta Batty, MD  Chief Complaint  Patient presents with   Follow-up    Carotid artery stenosis, hypertension, hyperlipidemia, chest pain, palpitations, OSA     History of Present Illness:  Mike Shepard is a 57 y.o. male  with a hx of right carotid artery stenosis (s/p RCEA 8 months ago and now with occluded RICA and 1-39% LICA by dopplers 07/2200), GERD, HLD, HTN and CVA. He  recently underwent home sleep study for HTN and excessive daytime sleepiness and showed mild OSA with an AHI of 6.6/hr and O2 sats as low as 88%.  Auto CPAP was ordered but he has not received his device yet.  When I last saw him he was having CP and a stress myoview showed no ischemia.  He also was having palpitations and heart monitor showed rare PVCs and PACs.  He was referred to [facility] for abnormal renal dopplers but he felt he had essential HTN and not renovascular HTN.  TSH and Aldo/Renin levels were normal.  Urine catecholamines were abnormal and he was referred to Endocrine for further evaluation.  He missed an appt and needs to get back in with them.    He is here today for followup and is doing well.  He denies any exertional chest pain, SOB, DOE, PND, orthopnea, LE edema, dizziness, or syncope. Over the past few months he has had  some palpitations.  He was seen in the ER in March and stated that the pain in his chest happened for a split second and was sharp and not really bothersome but the palpitations are more bothersome. Trop was normal. It is nonexertional for the most part but sometimes during the day he will feel it.  He was seen again in April in the ER for palpitations and feeling like his heart is jumping out of his chest along with some vague chest discomfort.  Trop was normal x 2. He walks several miles a day with no problems.  He is compliant with his meds  and is tolerating meds with no SE.    He has OSA but has never gotten his CPAP.  I will get set back up on that.  Past Medical History:  Diagnosis Date   Aortic atherosclerosis (HCC)    Carotid artery stenosis    s/p RCEA now with occluded RICA and 1-39% LICA stenosis by dopplers 10/2020   Chronic back pain    see surgeries    GERD (gastroesophageal reflux disease)    H/O: gout    Hyperlipidemia    Hypertension    PPD positive    Positive PPD, remotely ~2000, s/p Rx     Pre-diabetes    Primary osteoarthritis of right hip    end stage   Sleep apnea    no machine yet   Stroke [facility])    " mild"   Wears dentures     Past Surgical History:  Procedure Laterality Date   BACK SURGERY     2007-2013 (low back)   ENDARTERECTOMY Right 06/02/2019   Procedure: ENDARTERECTOMY CAROTID RIGHT;  Surgeon: Cephus Shelling, MD;  Location: [facility] OR;  Service: Vascular;  Laterality: Right;   INTRAOPERATIVE ARTERIOGRAM  06/02/2019   Procedure: Right carotid and cerebral angiogram;  Surgeon: Cephus Shelling, MD;  Location: [facility] OR;  Service: Vascular;;  MULTIPLE TOOTH EXTRACTIONS     NECK SURGERY     2010   OPERATIVE ULTRASOUND Right 06/02/2019   Procedure: Operative Ultrasound;  Surgeon: Cephus Shelling, MD;  Location: [facility] OR;  Service: Vascular;  Laterality: Right;   TOTAL HIP ARTHROPLASTY Right 02/22/2023   Procedure: TOTAL HIP ARTHROPLASTY ANTERIOR APPROACH;  Surgeon: Samson Frederic, MD;  Location: [facility] ORS;  Service: Orthopedics;  Laterality: Right;  150    Current Medications: Current Meds  Medication Sig   amLODipine (NORVASC) 10 MG tablet Take 1 tablet (10 mg total) by mouth daily.   atorvastatin (LIPITOR) 80 MG tablet Take 1 tablet (80 mg total) by mouth daily.   buprenorphine (SUBUTEX) 8 MG SUBL SL tablet Place 8 mg under the tongue daily.   carvedilol (COREG) 25 MG tablet Take 1 tablet (25 mg total) by mouth 2 (two) times daily with a meal.   clopidogrel (PLAVIX) 75 MG tablet  Take 1 tablet (75 mg total) by mouth daily.   ezetimibe (ZETIA) 10 MG tablet Take 1 tablet (10 mg total) by mouth daily.   fluticasone (FLONASE) 50 MCG/ACT nasal spray Place 1 spray into both nostrils daily as needed for allergies or rhinitis.   hydrALAZINE (APRESOLINE) 100 MG tablet TAKE 1 TABLET BY MOUTH THREE TIMES DAILY   Multiple Vitamin (MULTIVITAMIN WITH MINERALS) TABS Take 1 tablet by mouth daily.   valsartan (DIOVAN) 320 MG tablet Take 1 tablet (320 mg total) by mouth daily.    Allergies:   Patient has no known allergies.   Social History   Socioeconomic History   Marital status: Married    Spouse name: Not on file   Number of children: 3   Years of education: Not on file   Highest education level: Not on file  Occupational History   Occupation: [place] [employer] [place] ([employer])  Tobacco Use   Smoking status: Former    Types: Cigarettes    Quit date: 01/27/2010    Years since quitting: 13.2   Smokeless tobacco: Current    Types: Snuff   Tobacco comments:    Quit tobacco, used to smoke 1 ppd ~ 24 years, quit ~ 2014    Dips, rec to see dentist!  Vaping Use   Vaping Use: Never used  Substance and Sexual Activity   Alcohol use: Never   Drug use: Not Currently    Types: Marijuana    Comment: 20 plus years   Sexual activity: Yes    Birth control/protection: None  Other Topics Concern   Not on file  Social History Narrative   Lives w/ wife   Has  3 children plus adopted 3 children   Social Determinants of Health   Financial Resource Strain: Not on file  Food Insecurity: No Food Insecurity (02/22/2023)   Hunger Vital Sign    Worried About Running Out of Food in the Last Year: Never true    Ran Out of Food in the Last Year: Never true  Transportation Needs: No Transportation Needs (02/22/2023)   PRAPARE - Administrator, Civil Service (Medical): No    Lack of Transportation (Non-Medical): No  Physical Activity: Not on file  Stress: Not on file  Social Connections:  Not on file     Family History:  The patient's family history includes Colon cancer in his paternal uncle; Diabetes in his brother and sister; Diabetes (age of onset: 68) in his father; Hypertension in his brother, mother, and sister; Prostate cancer in his paternal  grandfather.   ROS:   Please see the history of present illness.    ROS All other systems reviewed and are negative.      No data to display           PHYSICAL EXAM:   VS:  BP (!) 152/80   Pulse 65   Ht 6' (1.829 m)   Wt 249 lb 3.2 oz (113 kg)   SpO2 97%   BMI 33.80 kg/m    GEN: Well nourished, well developed in no acute distress HEENT: Normal NECK: No JVD; No carotid bruits LYMPHATICS: No lymphadenopathy CARDIAC:RRR, no murmurs, rubs, gallops RESPIRATORY:  Clear to auscultation without rales, wheezing or rhonchi  ABDOMEN: Soft, non-tender, non-distended MUSCULOSKELETAL:  trace LE edema; No deformity  SKIN: Warm and dry NEUROLOGIC:  Alert and oriented x 3 PSYCHIATRIC:  Normal affect  Wt Readings from Last 3 Encounters:  05/02/23 249 lb 3.2 oz (113 kg)  04/08/23 240 lb (108.9 kg)  02/22/23 235 lb 14.3 oz (107 kg)      Studies/Labs Reviewed:   EKG:  EKG is not ordered today.    Recent Labs: 06/09/2022: TSH 0.79 12/15/2022: ALT 15 04/08/2023: BUN 6; Creatinine, Ser 0.95; Hemoglobin 12.8; Platelets 285; Potassium 4.3; Sodium 137   Lipid Panel    Component Value Date/Time   CHOL 162 12/15/2022 1414   CHOL 156 09/27/2021 1111   TRIG 149 12/15/2022 1414   HDL 46 12/15/2022 1414   HDL 46 09/27/2021 1111   CHOLHDL 3.5 12/15/2022 1414   VLDL 38.2 03/17/2022 1039   LDLCALC 91 12/15/2022 1414   LDLDIRECT 87.0 06/26/2019 0943      Additional studies/ records that were reviewed today include:  Home sleep study and PAP compliance download    ASSESSMENT:    1. OSA (obstructive sleep apnea)   2. Essential hypertension   3. Bilateral carotid artery stenosis   4. Pure hypercholesterolemia   5.  Chest pain of uncertain etiology   6. Palpitations       PLAN:  In order of problems listed above:  OSA - his CPAP was ordered but has never picked it up>>I will reorder it again   HTN -BP borderline controlled on exam today -Continue prescription drug management with amlodipine 10 mg daily, carvedilol 25 mg twice daily hydralazine 100 mg 3 times daily and Diovan 320 mg daily with as needed refillsl -renal dopplers showed ? mild to moderate renal artery stenosis 02/2021 and evaluated by vascular who recommended medical management of HTN.  Renal aortic ratio  2.4 on the right and 2.15 on the left suggesting that he did not have renal artery stenosis. -Aldo and renin normal -TSH normal -urine catechols were abnormal>>followed by Endocrine but his endocrinologist left so will refer to Dr. Talmage Nap -add spironolactone 25mg  daily to help with BP and mild LE edema -check BMET in 1 week  Carotid artery stenosis -dopplers 12/2022 showed occluded RICA and 1-39% LICA -followed by PCP -Continue prescription drug management with Plavix 75 mg daily, atorvastatin 80 mg daily with PRN Refills  HLD -LDL goal < 70 due to carotid artery stenosis -I have personally reviewed and interpreted outside Labs performed by patient's PCP which showed LDL 91 and HDL 46 on 12/15/2022 and was started on Crestor 40 mg daily but then changed to atorvastatin 80 mg daily -Repeat FLP and ALT  Chest pain -this was  atypical but he has hx of vascular disease as well as remote hx of tobacco abuse,  fm hx of premature CAD, HLD and HTN -EKG nonischemic in Sept 2021 -Coronary CTA 2022 was poor quality -lexican myoview showed no ischemia -had 2 vague episodes of sharp pain for a second in setting of palpitations but can walk for 2 miles with no symptoms>>no further workup at this time  Palpitations -? Related to caffeine -2 week zio 12/2020 showed rare PVCs and PACs -now having more palpitations with 2 ER visits in the past  2 months -repeat 2 week Ziopatch -continue prescription drug management with carvedilol 25 mg twice daily with as needed refills  Followup with me in 1 year  Medication Adjustments/Labs and Tests Ordered: Current medicines are reviewed at length with the patient today.  Concerns regarding medicines are outlined above.  Medication changes, Labs and Tests ordered today are listed in the Patient Instructions below.  There are no Patient Instructions on file for this visit.   Signed, Armanda Magic, MD  05/02/2023 3:55 PM    [facility] [address] [place], [place], [place]  [address] Phone: [phone]; Fax: [phone]

## 2023-05-02 NOTE — Patient Instructions (Signed)
Medication Instructions:  Please START taking 25 mg of spironolactone daily.  *If you need a refill on your cardiac medications before your next appointment, please call your pharmacy*   Lab Work: Please schedule a time to complete a FASTING lipid panel and a CMET in our lab in ONE WEEK.  If you have labs (blood work) drawn today and your tests are completely normal, you will receive your results only by: MyChart Message (if you have MyChart) OR A paper copy in the mail If you have any lab test that is abnormal or we need to change your treatment, we will call you to review the results.   Testing/Procedures: Mike Shepard- Long Term Monitor Instructions  Your physician has requested you wear a ZIO patch monitor for 14 days.  This is a single patch monitor. [employer] supplies one patch monitor per enrollment. Additional stickers are not available. Please do not apply patch if you will be having a Nuclear Stress Test,  Echocardiogram, Cardiac CT, MRI, or Chest Xray during the period you would be wearing the  monitor. The patch cannot be worn during these tests. You cannot remove and re-apply the  ZIO XT patch monitor.  Your ZIO patch monitor will be mailed 3 day USPS to your address on file. It may take 3-5 days  to receive your monitor after you have been enrolled.  Once you have received your monitor, please review the enclosed instructions. Your monitor  has already been registered assigning a specific monitor serial # to you.  Billing and Patient Assistance Program Information  We have supplied [employer] with any of your insurance information on file for billing purposes. [employer] offers a sliding scale Patient Assistance Program for patients that do not have  insurance, or whose insurance does not completely cover the cost of the ZIO monitor.  You must apply for the Patient Assistance Program to qualify for this discounted rate.  To apply, please call [employer] at [phone], select option  4, select option 2, ask to apply for  Patient Assistance Program. [name] will ask your household income, and how many people  are in your household. They will quote your out-of-pocket cost based on that information.  [employer] will also be able to set up a 61-month, interest-free payment plan if needed.  Applying the monitor   Shave hair from upper left chest.  Hold abrader disc by orange tab. Rub abrader in 40 strokes over the upper left chest as  indicated in your monitor instructions.  Clean area with 4 enclosed alcohol pads. Let dry.  Apply patch as indicated in monitor instructions. Patch will be placed under collarbone on left  side of chest with arrow pointing upward.  Rub patch adhesive wings for 2 minutes. Remove white label marked "1". Remove the white  label marked "2". Rub patch adhesive wings for 2 additional minutes.  While looking in a mirror, press and release button in center of patch. A small green light will  flash 3-4 times. This will be your only indicator that the monitor has been turned on.  Do not shower for the first 24 hours. You may shower after the first 24 hours.  Press the button if you feel a symptom. You will hear a small click. Record Date, Time and  Symptom in the Patient Logbook.  When you are ready to remove the patch, follow instructions on the last 2 pages of Patient  Logbook. Stick patch monitor onto the last page of Patient Logbook.  Place Patient Logbook in the blue and white box. Use locking tab on box and tape box closed  securely. The blue and white box has prepaid postage on it. Please place it in the mailbox as  soon as possible. Your physician should have your test results approximately 7 days after the  monitor has been mailed back to [facility].  Call [facility] Customer Care at [phone] if you have questions regarding  your ZIO XT patch monitor. Call them immediately if you see an orange light blinking on your  monitor.  If  your monitor falls off in less than 4 days, contact our Monitor department at [phone].  If your monitor becomes loose or falls off after 4 days call [employer] at [phone] for  suggestions on securing your monitor    Follow-Up: At [facility], you and your health needs are our priority.  As part of our continuing mission to provide you with exceptional heart care, we have created designated Provider Care Teams.  These Care Teams include your primary Cardiologist (physician) and Advanced Practice Providers (APPs -  Physician Assistants and Nurse Practitioners) who all work together to provide you with the care you need, when you need it.  We recommend signing up for the patient portal called "MyChart".  Sign up information is provided on this After Visit Summary.  MyChart is used to connect with patients for Virtual Visits (Telemedicine).  Patients are able to view lab/test results, encounter notes, upcoming appointments, etc.  Non-urgent messages can be sent to your provider as well.   To learn more about what you can do with MyChart, go to [email or web address].    Your next appointment:   1 year(s)  Provider:   Armanda Magic, MD     Other Instructions Dr. Mayford Knife has referred you to an endocrinologist named Dr. Dorisann Frames. Someone from her office will call you to set this appointment up.   Dr. Mayford Knife has also sent Cpap equipment orders to your DME company. Someone will call you to coordinate delivery of this equipment.

## 2023-05-04 DIAGNOSIS — R002 Palpitations: Secondary | ICD-10-CM | POA: Diagnosis not present

## 2023-05-09 ENCOUNTER — Ambulatory Visit: Payer: 59 | Attending: Cardiology

## 2023-05-09 DIAGNOSIS — E7800 Pure hypercholesterolemia, unspecified: Secondary | ICD-10-CM

## 2023-05-09 DIAGNOSIS — Z79899 Other long term (current) drug therapy: Secondary | ICD-10-CM

## 2023-05-10 LAB — COMPREHENSIVE METABOLIC PANEL
ALT: 17 IU/L (ref 0–44)
AST: 20 IU/L (ref 0–40)
Albumin/Globulin Ratio: 1.4 (ref 1.2–2.2)
Albumin: 4.2 g/dL (ref 3.8–4.9)
Alkaline Phosphatase: 148 IU/L — ABNORMAL HIGH (ref 44–121)
BUN/Creatinine Ratio: 9 (ref 9–20)
BUN: 9 mg/dL (ref 6–24)
Bilirubin Total: 0.3 mg/dL (ref 0.0–1.2)
CO2: 22 mmol/L (ref 20–29)
Calcium: 9.1 mg/dL (ref 8.7–10.2)
Chloride: 102 mmol/L (ref 96–106)
Creatinine, Ser: 1.04 mg/dL (ref 0.76–1.27)
Globulin, Total: 3.1 g/dL (ref 1.5–4.5)
Glucose: 150 mg/dL — ABNORMAL HIGH (ref 70–99)
Potassium: 4.3 mmol/L (ref 3.5–5.2)
Sodium: 138 mmol/L (ref 134–144)
Total Protein: 7.3 g/dL (ref 6.0–8.5)
eGFR: 84 mL/min/{1.73_m2} (ref >59)

## 2023-05-10 LAB — LIPID PANEL
Chol/HDL Ratio: 2.6 ratio (ref 0.0–5.0)
Cholesterol, Total: 110 mg/dL (ref 100–199)
HDL: 43 mg/dL (ref >39)
LDL Chol Calc (NIH): 46 mg/dL (ref 0–99)
Triglycerides: 112 mg/dL (ref 0–149)
VLDL Cholesterol Cal: 21 mg/dL (ref 5–40)

## 2023-05-16 ENCOUNTER — Telehealth: Payer: Self-pay

## 2023-05-16 NOTE — Telephone Encounter (Signed)
Discussed normal kidney function, electrolytes, lipids and AST/ALT. Discussed elevated alk phos needing further follow up w/ PCP. Patient states he has PCP appt scheduled for mid June and will discuss w/ PCP then. Forwarded results to PCP at patient's request.

## 2023-05-16 NOTE — Telephone Encounter (Signed)
-----   Message from Meriam Sprague, MD sent at 05/11/2023  9:55 AM EDT ----- Kidney function, electrolytes look great. AST /ALT normal. Lipids look great.   Alk Phos slightly elevated. Would follow-up with PCP for further evaluation

## 2023-05-22 ENCOUNTER — Encounter: Payer: Self-pay | Admitting: Cardiology

## 2023-05-24 ENCOUNTER — Telehealth: Payer: Self-pay

## 2023-05-24 DIAGNOSIS — R079 Chest pain, unspecified: Secondary | ICD-10-CM

## 2023-05-24 DIAGNOSIS — R002 Palpitations: Secondary | ICD-10-CM

## 2023-05-24 DIAGNOSIS — Z79899 Other long term (current) drug therapy: Secondary | ICD-10-CM

## 2023-05-24 MED ORDER — DILTIAZEM HCL ER COATED BEADS 180 MG PO CP24
180.0000 mg | ORAL_CAPSULE | Freq: Every day | ORAL | 3 refills | Status: DC
Start: 2023-05-24 — End: 2024-03-06

## 2023-05-24 NOTE — Telephone Encounter (Signed)
Called patient to review heart monitor results. Per Dr. Mayford Knife, heart monitor showed episodes of a fast heart rhythm from the top of the heart called atrial tachycardia lasting up to 17 beats in a row with rare extra heartbeats from the bottom of the heart.  He is on maximum dose carvedilol. Patient verbalizes understanding and agrees to stop amlodipine and start Cardizem CD 180 mg daily.  Provided education on keeping a blood pressure log and patient agrees to  check his heart rate and blood pressure daily for a week and call with results.  Patient also agrees to  Echo, order placed. Patient advised to call our office if palpitations do not resolve with starting cardizem. Magnesium lab ordered and scheduled. Patient verbalizes understanding to cut back on alcohol and caffeine.

## 2023-05-24 NOTE — Addendum Note (Signed)
Addended by: Luellen Pucker on: 05/24/2023 05:30 PM   Modules accepted: Orders

## 2023-05-24 NOTE — Telephone Encounter (Signed)
-----   Message from Quintella Reichert, MD sent at 05/22/2023 11:55 AM EDT ----- Heart monitor showed episodes of a fast heart rhythm from the top of the heart called atrial tachycardia lasting up to 17 beats in a row with rare extra heartbeats from the bottom of the heart.  He is on maximum dose carvedilol..  Please stop amlodipine and start Cardizem CD 180 mg daily.   please have him check his heart rate and blood pressure daily for a week and call with results.   please get 2D echocardiogram for SVT

## 2023-05-25 ENCOUNTER — Ambulatory Visit: Payer: 59 | Attending: Internal Medicine

## 2023-06-15 ENCOUNTER — Ambulatory Visit (HOSPITAL_BASED_OUTPATIENT_CLINIC_OR_DEPARTMENT_OTHER): Payer: 59 | Attending: Cardiology | Admitting: Cardiology

## 2023-06-15 ENCOUNTER — Ambulatory Visit (INDEPENDENT_AMBULATORY_CARE_PROVIDER_SITE_OTHER): Payer: 59 | Admitting: Internal Medicine

## 2023-06-15 ENCOUNTER — Encounter: Payer: Self-pay | Admitting: Internal Medicine

## 2023-06-15 VITALS — BP 138/70 | HR 58 | Temp 98.2°F | Resp 16 | Ht 72.0 in | Wt 244.2 lb

## 2023-06-15 DIAGNOSIS — G4733 Obstructive sleep apnea (adult) (pediatric): Secondary | ICD-10-CM | POA: Diagnosis not present

## 2023-06-15 DIAGNOSIS — R0683 Snoring: Secondary | ICD-10-CM | POA: Diagnosis not present

## 2023-06-15 DIAGNOSIS — I10 Essential (primary) hypertension: Secondary | ICD-10-CM | POA: Insufficient documentation

## 2023-06-15 DIAGNOSIS — E119 Type 2 diabetes mellitus without complications: Secondary | ICD-10-CM

## 2023-06-15 DIAGNOSIS — R7989 Other specified abnormal findings of blood chemistry: Secondary | ICD-10-CM

## 2023-06-15 DIAGNOSIS — Z0000 Encounter for general adult medical examination without abnormal findings: Secondary | ICD-10-CM

## 2023-06-15 LAB — HEPATIC FUNCTION PANEL
ALT: 15 U/L (ref 0–53)
AST: 15 U/L (ref 0–37)
Albumin: 4.5 g/dL (ref 3.5–5.2)
Alkaline Phosphatase: 153 U/L — ABNORMAL HIGH (ref 39–117)
Bilirubin, Direct: 0.1 mg/dL (ref 0.0–0.3)
Total Bilirubin: 0.6 mg/dL (ref 0.2–1.2)
Total Protein: 7.9 g/dL (ref 6.0–8.3)

## 2023-06-15 LAB — GAMMA GT: GGT: 23 U/L (ref 7–51)

## 2023-06-15 LAB — HEMOGLOBIN A1C: Hgb A1c MFr Bld: 6.4 % (ref 4.6–6.5)

## 2023-06-15 LAB — PSA: PSA: 0.61 ng/mL (ref 0.10–4.00)

## 2023-06-15 MED ORDER — EZETIMIBE 10 MG PO TABS: 10.0000 mg | ORAL_TABLET | Freq: Every day | ORAL | 3 refills | Status: DC

## 2023-06-15 MED ORDER — ATORVASTATIN CALCIUM 80 MG PO TABS: 80.0000 mg | ORAL_TABLET | Freq: Every day | ORAL | 1 refills | Status: DC

## 2023-06-15 NOTE — Progress Notes (Unsigned)
Subjective:    Patient ID: Mike Shepard, male    DOB: Apr 22, 1966, 57 y.o.   MRN: [id number]  DOS:  06/15/2023 Type of visit - description: cpx Here for CPX Since LOV he had hip surgery, recuperating, went back to work full-time. Saw cardiology, note reviewed. In general feeling well.   Review of Systems  Other than above, a 14 point review of systems is negative     Past Medical History:  Diagnosis Date   Aortic atherosclerosis (HCC)    Carotid artery stenosis    s/p RCEA now with occluded RICA and 1-39% LICA stenosis by dopplers 10/2020   Chronic back pain    see surgeries    GERD (gastroesophageal reflux disease)    H/O: gout    Hyperlipidemia    Hypertension    PPD positive    Positive PPD, remotely ~2000, s/p Rx     Pre-diabetes    Primary osteoarthritis of right hip    end stage   Sleep apnea    no machine yet   Stroke [facility])    " mild"   SVT (supraventricular tachycardia)    Wears dentures     Past Surgical History:  Procedure Laterality Date   BACK SURGERY     2007-2013 (low back)   ENDARTERECTOMY Right 06/02/2019   Procedure: ENDARTERECTOMY CAROTID RIGHT;  Surgeon: Cephus Shelling, MD;  Location: [facility] OR;  Service: Vascular;  Laterality: Right;   INTRAOPERATIVE ARTERIOGRAM  06/02/2019   Procedure: Right carotid and cerebral angiogram;  Surgeon: Cephus Shelling, MD;  Location: [facility] OR;  Service: Vascular;;   MULTIPLE TOOTH EXTRACTIONS     NECK SURGERY     2010   OPERATIVE ULTRASOUND Right 06/02/2019   Procedure: Operative Ultrasound;  Surgeon: Cephus Shelling, MD;  Location: [facility] OR;  Service: Vascular;  Laterality: Right;   TOTAL HIP ARTHROPLASTY Right 02/22/2023   Procedure: TOTAL HIP ARTHROPLASTY ANTERIOR APPROACH;  Surgeon: Samson Frederic, MD;  Location: [facility] ORS;  Service: Orthopedics;  Laterality: Right;  150   Social History   Socioeconomic History   Marital status: Married    Spouse name: Not on file   Number of children: 3    Years of education: Not on file   Highest education level: Not on file  Occupational History   Occupation: [place] [employer] [place] ([employer]  Tobacco Use   Smoking status: Former    Types: Cigarettes    Quit date: 01/27/2010    Years since quitting: 13.3   Smokeless tobacco: Current    Types: Snuff   Tobacco comments:    Quit tobacco, used to smoke 1 ppd ~ 24 years, quit ~ 2014    Dips, rec to see dentist!  Vaping Use   Vaping Use: Never used  Substance and Sexual Activity   Alcohol use: Never   Drug use: Not Currently    Types: Marijuana    Comment: 20 plus years   Sexual activity: Yes    Birth control/protection: None  Other Topics Concern   Not on file  Social History Narrative   Lives w/ wife   Has  3 children plus adopted 3 children   Social Determinants of Health   Financial Resource Strain: Not on file  Food Insecurity: No Food Insecurity (02/22/2023)   Hunger Vital Sign    Worried About Running Out of Food in the Last Year: Never true    Ran Out of Food in the Last Year: Never true  Transportation Needs: No Transportation Needs (02/22/2023)   PRAPARE - Administrator, Civil Service (Medical): No    Lack of Transportation (Non-Medical): No  Physical Activity: Not on file  Stress: Not on file  Social Connections: Not on file  Intimate Partner Violence: Not At Risk (02/22/2023)   Humiliation, Afraid, Rape, and Kick questionnaire    Fear of Current or Ex-Partner: No    Emotionally Abused: No    Physically Abused: No    Sexually Abused: No     Current Outpatient Medications  Medication Instructions   atorvastatin (LIPITOR) 80 mg, Oral, Daily   buprenorphine (SUBUTEX) 8 mg, Sublingual, Daily   carvedilol (COREG) 25 mg, Oral, 2 times daily with meals   clopidogrel (PLAVIX) 75 mg, Oral, Daily   diltiazem (CARDIZEM CD) 180 mg, Oral, Daily   ezetimibe (ZETIA) 10 mg, Oral, Daily   fluticasone (FLONASE) 50 MCG/ACT nasal spray 1 spray, Each Nare, Daily PRN   hydrALAZINE  (APRESOLINE) 100 mg, Oral, 3 times daily   Multiple Vitamin (MULTIVITAMIN WITH MINERALS) TABS 1 tablet, Daily   spironolactone (ALDACTONE) 25 mg, Oral, Daily   valsartan (DIOVAN) 320 mg, Oral, Daily       Objective:   Physical Exam BP 138/70   Pulse (!) 58   Temp 98.2 F (36.8 C) (Oral)   Resp 16   Ht 6' (1.829 m)   Wt 244 lb 4 oz (110.8 kg)   SpO2 95%   BMI 33.13 kg/m  General: Well developed, NAD, BMI noted Neck: No  thyromegaly  HEENT:  Normocephalic . Face symmetric, atraumatic Lungs:  CTA B Normal respiratory effort, no intercostal retractions, no accessory muscle use. Heart: RRR,  no murmur.  Abdomen:  Not distended, soft, non-tender. No rebound or rigidity.   Lower extremities: no pretibial edema bilaterally  Skin: Exposed areas without rash. Not pale. Not jaundice Neurologic:  alert & oriented X3.  Speech normal, gait appropriate for age and unassisted Strength symmetric and appropriate for age.  Psych: Cognition and judgment appear intact.  Cooperative with normal attention span and concentration.  Behavior appropriate. No anxious or depressed appearing.     Assessment    Assessment DM: A1c 6.5 (08/2019) HTN Hyperlipidemia CV: --MRI- brain  02-2018: Remote infarct, that lead to further w/u, dx w/ carotid dz -- R endarterectomy 05/2019 ---had w/u for secondary HTN 2022 -- neg stress test 02/2021 --Atrial tachycardia , dx 04/2023  OSA Dx 02/2021 GERD Elevated TSH 2014 H/o gout  MSK:  -DJD hip --Chronic back pain + PPD 2000, s/p  antibiotics Increase LFTs: noted 04/2019, coincide w/ statin RX, AP slightly elevated.U/S 02/2020: Fatty liver. 05/2020: Hep B negative, iron normal.   PLAN  Here for CPX -Td 2016 -Vaccine I recommend: Shingrix, COVID booster, flu shot this fall -CCS: cscope 11-2020, next 5 years per GI letter -prostate ca screening: + FH prostate cancer, GF age 75; no symptoms, check PSA. -Labs: PSA, LFTs, GGT, A1c, PSA --Diet exercise:  Discussed --Lung cancer screening: Smoking history 24 pack a year, quit 8 years ago, next CT chest 08-2023. -Healthcare POA: Information provided DM: Diet controlled, check A1c. Atrial tachy: Saw cardiology 05/02/2023 for palpitations , a monitor showed  atrial tachycardia , amlodipine switched to Cardizem CD 180 mg daily.  Symptoms are much improved. HTN: Since amlodipine was switched to Cardizem BPs are great, range from 117-130.  Continue present care. Carotid artery stenosis, saw vascular surgery 12/26/2022, next visit 1 year. Dyslipidemia, LDL last month  46, continue present care.  RF Lipitor and Zetia  OSA: Just picked up the CPAP equipment today. Increase alkaline phosphatase: Not a new issue, recheck LFTs and GGT. RTC 6 months

## 2023-06-15 NOTE — Patient Instructions (Addendum)
Vaccines I recommend: Covid booster Shingrix (shingles) Flu shot this fall  Per our records you are due for your diabetic eye exam. Please contact your eye doctor to schedule an appointment. Please have them send copies of your office visit notes to [place]. Our fax number is [phone]. If you need a referral to an eye doctor please let us know.'  Check the  blood pressure regularly BP GOAL is between 110/65 and  135/85. If it is consistently higher or lower, let me know     GO TO THE LAB : Get the blood work     GO TO THE FRONT DESK, PLEASE SCHEDULE YOUR APPOINTMENTS Come back for checkup in 6 months    "Health Care Power of attorney" ,  "Living will" (Advance care planning documents)  If you already have a living will or healthcare power of attorney, is recommended you bring the copy to be scanned in your chart.   The document will be available to all the doctors you see in the system.  Advance care planning is a process that supports adults in  understanding and sharing their preferences regarding future medical care.  The patient's preferences are recorded in documents called Advance Directives and the can be modified at any time while the patient is in full mental capacity.   If you don't have one, please consider create one.      More information at: [email or web address]

## 2023-06-17 ENCOUNTER — Encounter: Payer: Self-pay | Admitting: Internal Medicine

## 2023-06-17 NOTE — Assessment & Plan Note (Signed)
-  Td 2016 -Vaccine I recommend: Shingrix, COVID booster, flu shot this fall -CCS: cscope 11-2020, next 5 years per GI letter -prostate ca screening: + FH prostate cancer, GF age 58; no symptoms, check PSA. -Labs: PSA, LFTs, GGT, A1c, PSA --Diet exercise: Discussed --Lung cancer screening: Smoking history 24 pack a year, quit 8 years ago, next CT chest 08-2023. -Healthcare POA: Information provided

## 2023-06-17 NOTE — Assessment & Plan Note (Signed)
Here for CPX DM: Diet controlled, check A1c. Atrial tachy: Saw cardiology 05/02/2023 for palpitations , a monitor showed  atrial tachycardia , amlodipine switched to Cardizem CD 180 mg daily.  Symptoms are much improved. HTN: Since amlodipine was switched to Cardizem BPs are great, range from 117-130.  Continue present care. Carotid artery stenosis, saw vascular surgery 12/26/2022, next visit 1 year. Dyslipidemia, LDL last month 46, continue present care.  RF Lipitor and Zetia  OSA: Just picked up the CPAP equipment today. Increase alkaline phosphatase: Not a new issue, recheck LFTs and GGT. RTC 6 months

## 2023-06-22 ENCOUNTER — Ambulatory Visit (HOSPITAL_COMMUNITY): Payer: 59 | Attending: Cardiology

## 2023-06-22 DIAGNOSIS — R002 Palpitations: Secondary | ICD-10-CM | POA: Diagnosis present

## 2023-06-22 DIAGNOSIS — R079 Chest pain, unspecified: Secondary | ICD-10-CM | POA: Insufficient documentation

## 2023-06-22 LAB — ECHOCARDIOGRAM COMPLETE
Area-P 1/2: 3.56 cm2
S' Lateral: 3 cm

## 2023-06-24 NOTE — Procedures (Signed)
   Patient Name: Mike Shepard, Mike Shepard Date: 06/17/2023 Gender: Male D.O.B: 04-04-1966 Age (years): 60 Referring Provider: Armanda Magic MD, ABSM Height (inches): 72 Interpreting Physician: Armanda Magic MD, ABSM Weight (lbs): 245 RPSGT: Bee Sink BMI: 33 MRN: [id number] Neck Size: 17.50  CLINICAL INFORMATION Sleep Study Type: HST  Indication for sleep study: Hypertension, OSA  Epworth Sleepiness Score: 9  Most recent polysomnogram dated 02/27/2021 revealed an AHI of 6.6/h and RDI of 6.6/h.  SLEEP STUDY TECHNIQUE A multi-channel overnight portable sleep study was performed. The channels recorded were: nasal airflow, thoracic respiratory movement, and oxygen saturation with a pulse oximetry. Snoring was also monitored.  MEDICATIONS Patient self administered medications include: N/A.  SLEEP ARCHITECTURE Patient was studied for 355.4 minutes. The sleep efficiency was 100.0 % and the patient was supine for 0%. The arousal index was 0.0 per hour.  RESPIRATORY PARAMETERS The overall AHI was 0.2 per hour, with a central apnea index of 0 per hour.  The oxygen nadir was 93% during sleep.  CARDIAC DATA Mean heart rate during sleep was 55.9 bpm.  IMPRESSIONS - No significant obstructive sleep apnea occurred during this study (AHI = 0.2/h). - The patient had minimal or no oxygen desaturation during the study (Min O2 = 93%) - Patient snored 4.7% during the sleep.  DIAGNOSIS - Normal study  RECOMMENDATIONS - Avoid alcohol, sedatives and other CNS depressants that may worsen sleep apnea and disrupt normal sleep architecture. - Sleep hygiene should be reviewed to assess factors that may improve sleep quality. - Weight management and regular exercise should be initiated or continued. - Return to Sleep Center to discuss the results of this study - Patient may benefit from in-lab study  [Electronically signed] 06/24/2023 05:15 PM  Armanda Magic MD, ABSM Diplomate,  American Board of Sleep Medicine

## 2023-07-05 ENCOUNTER — Telehealth: Payer: Self-pay

## 2023-07-05 NOTE — Telephone Encounter (Signed)
-----   Message from Armanda Magic sent at 06/24/2023  5:16 PM EDT ----- Please let patient know that sleep study showed no significant sleep apnea.

## 2023-07-05 NOTE — Telephone Encounter (Signed)
Notified patient of sleep study results. All questions (If any) were answered. Patient verbalized understanding.

## 2023-07-13 ENCOUNTER — Telehealth: Payer: Self-pay

## 2023-07-13 NOTE — Telephone Encounter (Signed)
-----   Message from Armanda Magic sent at 06/24/2023  4:52 PM EDT ----- Normal heart function on echo with mild to moderately enlarged left upper chamber of heart called left atrium, mildly leaky Mitral valve

## 2023-07-13 NOTE — Telephone Encounter (Signed)
Called patient to review echo results. Patient verbalizes understanding of normal heart function, presence of mildly leaky valve and enlarged left atrium.

## 2023-08-21 ENCOUNTER — Other Ambulatory Visit: Payer: Self-pay | Admitting: Internal Medicine

## 2023-08-29 ENCOUNTER — Telehealth: Payer: Self-pay | Admitting: Cardiology

## 2023-08-29 NOTE — Telephone Encounter (Signed)
  Per MyChart scheduling message:  Initial complaint:  medicaton dropping bp too low  Pt c/o BP issue: STAT if pt c/o blurred vision, one-sided weakness or slurred speech  1. What are your last 5 BP readings?   2. Are you having any other symptoms (ex. Dizziness, headache, blurred vision, passed out)?   3. What is your BP issue?    116/66 110/62, 115/70, 118/70 without taking  diltiazem ER(CD)180MG /24HR CAP and yes feeling dizzy in the morning mostly.  Worry my BP is dropping too low with all the medications I am taking . Some have change and to doctor. Thanks

## 2023-08-30 MED ORDER — HYDRALAZINE HCL 50 MG PO TABS: 75.0000 mg | ORAL_TABLET | Freq: Three times a day (TID) | ORAL | 0 refills | Status: DC

## 2023-08-30 NOTE — Addendum Note (Signed)
Addended by: Luellen Pucker on: 08/30/2023 05:36 PM   Modules accepted: Orders

## 2023-08-30 NOTE — Telephone Encounter (Signed)
Call to patient to advise that Dr. Mayford Knife would like him to decrease his hydralazine dose to 75 mg TID, check BP x 1 week and let us know the readings. Patient verbalizes understanding and agrees to plan. Orders sent to pharmacy of choice.

## 2023-08-30 NOTE — Telephone Encounter (Signed)
Call to patient to find out more about how he is taking his medications.  Patient states he takes his hydralazine, spironolactone and valsartan in the morning.   He takes cardizem, hydralazine and coreg at lunch.  He takes his hydralazine and coreg again at bedtime.  He states that since he started cardizem, he has noticed his is a little dizzy and lightheaded in the afternoon. He originally was taking cardizem with his other morning meds but that dropped his BP even lower than what he reported in his phone call so he started taking the cardizem later in the day.  Patient states he has made a lot of diet changes and lost 3 lbs and he is wondering if this is why he is having a hard time tolerating this new medication. Forwarded to Dr. Mayford Knife.

## 2023-09-05 ENCOUNTER — Inpatient Hospital Stay: Admission: RE | Admit: 2023-09-05 | Payer: 59 | Source: Ambulatory Visit

## 2023-09-14 NOTE — Telephone Encounter (Signed)
Called patient to ask about BP log since changing hydralazine dose, no answer, left detailed message per DPR asking patient to call our office regarding bp readings for last 1-2 weeks.

## 2023-09-19 ENCOUNTER — Telehealth: Payer: Self-pay | Admitting: Cardiology

## 2023-09-19 MED ORDER — HYDRALAZINE HCL 50 MG PO TABS: 75.0000 mg | ORAL_TABLET | Freq: Three times a day (TID) | ORAL | 2 refills | Status: DC

## 2023-09-19 NOTE — Telephone Encounter (Signed)
 *  STAT* If patient is at the pharmacy, call can be transferred to refill team.   1. Which medications need to be refilled? (please list name of each medication and dose if known)   hydrALAZINE (APRESOLINE) 50 MG tablet   2. Would you like to learn more about the convenience, safety, & potential cost savings by using the [facility]?   3. Are you open to using the [facility]   4. Which pharmacy/location (including street and city if local pharmacy) is medication to be sent to?  [facility] [address] [place] - [address]   5. Do they need a 30 day or 90 day supply?  90

## 2023-09-19 NOTE — Telephone Encounter (Signed)
Pt's medication was sent to pt's pharmacy as requested. Confirmation received.  °

## 2023-09-19 NOTE — Telephone Encounter (Signed)
  Per MyChart scheduling message:   Blood pressure is normal now.  I am not having dizziness any more.  Decreasing  the Hydralszine 75mg  is ok.

## 2023-09-26 NOTE — Telephone Encounter (Signed)
Call to patient to follow up on BP log. Patient provides the following BP readings and states he is no longer dizzy, he feels the lower dose of hydralazine is working better for him.  09/26/23: 117/75 09/25/23: 125/85 09/24/23: 117/80

## 2023-10-01 NOTE — Telephone Encounter (Signed)
Call to patient to advise Dr. Mayford Knife is happy with his blood pressures and to continue on current BP regimen. Patient verbalizes understanding and agrees to plan.

## 2023-10-23 ENCOUNTER — Encounter (HOSPITAL_COMMUNITY): Payer: 59

## 2023-10-23 ENCOUNTER — Ambulatory Visit: Payer: 59 | Admitting: Vascular Surgery

## 2023-11-20 ENCOUNTER — Encounter (HOSPITAL_COMMUNITY): Payer: 59

## 2023-11-20 ENCOUNTER — Ambulatory Visit: Payer: 59 | Admitting: Vascular Surgery

## 2023-11-24 ENCOUNTER — Telehealth: Payer: Self-pay | Admitting: Cardiology

## 2023-11-24 MED ORDER — VALSARTAN 320 MG PO TABS: 320.0000 mg | ORAL_TABLET | Freq: Every day | ORAL | 11 refills | Status: DC

## 2023-11-24 NOTE — Telephone Encounter (Signed)
Patient called the answering service this AM requesting refill of his valsartan. Takes valsartan 320 mg daily.   Sent a 1 month supply with 11 refills to [facility] on [address] at patient's request.

## 2023-11-28 ENCOUNTER — Other Ambulatory Visit: Payer: Self-pay

## 2023-11-28 MED ORDER — VALSARTAN 320 MG PO TABS: 320.0000 mg | ORAL_TABLET | Freq: Every day | ORAL | 1 refills | Status: DC

## 2023-12-16 ENCOUNTER — Other Ambulatory Visit: Payer: Self-pay | Admitting: Cardiology

## 2023-12-21 ENCOUNTER — Ambulatory Visit (INDEPENDENT_AMBULATORY_CARE_PROVIDER_SITE_OTHER): Payer: 59 | Admitting: Internal Medicine

## 2023-12-21 ENCOUNTER — Other Ambulatory Visit: Payer: Self-pay

## 2023-12-21 VITALS — BP 130/80 | HR 62 | Temp 98.1°F | Resp 16 | Ht 72.0 in | Wt 241.5 lb

## 2023-12-21 DIAGNOSIS — E119 Type 2 diabetes mellitus without complications: Secondary | ICD-10-CM

## 2023-12-21 DIAGNOSIS — R7989 Other specified abnormal findings of blood chemistry: Secondary | ICD-10-CM | POA: Diagnosis not present

## 2023-12-21 DIAGNOSIS — I6523 Occlusion and stenosis of bilateral carotid arteries: Secondary | ICD-10-CM

## 2023-12-21 DIAGNOSIS — I10 Essential (primary) hypertension: Secondary | ICD-10-CM

## 2023-12-21 LAB — CBC WITH DIFFERENTIAL/PLATELET
Basophils Absolute: 0.1 10*3/uL (ref 0.0–0.1)
Basophils Relative: 0.9 % (ref 0.0–3.0)
Eosinophils Absolute: 0.3 10*3/uL (ref 0.0–0.7)
Eosinophils Relative: 4.4 % (ref 0.0–5.0)
HCT: 41.2 % (ref 39.0–52.0)
Hemoglobin: 13.7 g/dL (ref 13.0–17.0)
Lymphocytes Relative: 28.7 % (ref 12.0–46.0)
Lymphs Abs: 2 10*3/uL (ref 0.7–4.0)
MCHC: 33.3 g/dL (ref 30.0–36.0)
MCV: 85.7 fL (ref 78.0–100.0)
Monocytes Absolute: 0.7 10*3/uL (ref 0.1–1.0)
Monocytes Relative: 10.2 % (ref 3.0–12.0)
Neutro Abs: 3.9 10*3/uL (ref 1.4–7.7)
Neutrophils Relative %: 55.8 % (ref 43.0–77.0)
Platelets: 313 10*3/uL (ref 150.0–400.0)
RBC: 4.8 Mil/uL (ref 4.22–5.81)
RDW: 13.3 % (ref 11.5–15.5)
WBC: 6.9 10*3/uL (ref 4.0–10.5)

## 2023-12-21 LAB — COMPREHENSIVE METABOLIC PANEL
ALT: 13 U/L (ref 0–53)
AST: 15 U/L (ref 0–37)
Albumin: 4.4 g/dL (ref 3.5–5.2)
Alkaline Phosphatase: 145 U/L — ABNORMAL HIGH (ref 39–117)
BUN: 9 mg/dL (ref 6–23)
CO2: 26 meq/L (ref 19–32)
Calcium: 9.4 mg/dL (ref 8.4–10.5)
Chloride: 101 meq/L (ref 96–112)
Creatinine, Ser: 0.91 mg/dL (ref 0.40–1.50)
GFR: 93.27 mL/min (ref >60.00)
Glucose, Bld: 115 mg/dL — ABNORMAL HIGH (ref 70–99)
Potassium: 4.3 meq/L (ref 3.5–5.1)
Sodium: 138 meq/L (ref 135–145)
Total Bilirubin: 0.6 mg/dL (ref 0.2–1.2)
Total Protein: 7.4 g/dL (ref 6.0–8.3)

## 2023-12-21 LAB — MICROALBUMIN / CREATININE URINE RATIO
Creatinine,U: 82.9 mg/dL
Microalb Creat Ratio: 0.8 mg/g (ref 0.0–30.0)
Microalb, Ur: <0.7 mg/dL (ref 0.0–1.9)

## 2023-12-21 LAB — IBC + FERRITIN
Ferritin: 31.1 ng/mL (ref 22.0–322.0)
Iron: 79 ug/dL (ref 42–165)
Saturation Ratios: 21.9 % (ref 20.0–50.0)
TIBC: 361.2 ug/dL (ref 250.0–450.0)
Transferrin: 258 mg/dL (ref 212.0–360.0)

## 2023-12-21 LAB — HEMOGLOBIN A1C: Hgb A1c MFr Bld: 6.5 % (ref 4.6–6.5)

## 2023-12-21 LAB — GAMMA GT: GGT: 17 U/L (ref 7–51)

## 2023-12-21 NOTE — Patient Instructions (Addendum)
  Check the  blood pressure regularly Blood pressure goal:  between 110/65 and  135/85. If it is consistently higher or lower, let me know     GO TO THE LAB : Get the blood work     Next visit with me by 05/2024, physical exam Please schedule it at the front desk   Per our records you are due for your diabetic eye exam. Please contact your eye doctor to schedule an appointment. Please have them send copies of your office visit notes to us . Our fax number is [phone]. If you need a referral to an eye doctor please let us  know.

## 2023-12-21 NOTE — Assessment & Plan Note (Signed)
 DM: Diet controlled, check A1c. HTN: Ambulatory BPs in the 130s/80s, recently cardiology decreased hydralazine  dose d/t low BPs.  He also takes Diovan , Aldactone , Cardizem .  Check a CMP. Hyperlipidemia: On Lipitor, controlled. Increased LFTs: On and off, mostly alkaline phosphatase.  Rechecking LFTs, GGT AP isoenzymes. Minimal anemia: Noted on chart review, recheck a CBC and iron panel. OSA?  Negative sleep test 05/2023 Vaccine advised: consistently declined vaccines, has not changed his mind. RTC 05/2024 CPX

## 2023-12-21 NOTE — Progress Notes (Signed)
 Subjective:    Patient ID: Mike Shepard, male    DOB: 1966/08/06, 58 y.o.   MRN: [id number]  DOS:  12/21/2023 Type of visit - description: f/u  Since the last office visit is feeling well. Had hip surgery, feels fully recuperated. Chronic medical problems addressed. Denies chest pain or difficulty breathing.  No lower extremity edema.  Very seldom has mild palpitations.   Wt Readings from Last 3 Encounters:  12/21/23 241 lb 8 oz (109.5 kg)  06/15/23 244 lb 4 oz (110.8 kg)  05/02/23 249 lb 3.2 oz (113 kg)     Review of Systems See above   Past Medical History:  Diagnosis Date   Aortic atherosclerosis (HCC)    Carotid artery stenosis    s/p RCEA now with occluded RICA and 1-39% LICA stenosis by dopplers 10/2020   Chronic back pain    see surgeries    GERD (gastroesophageal reflux disease)    H/O: gout    Hyperlipidemia    Hypertension    PPD positive    Positive PPD, remotely ~2000, s/p Rx     Pre-diabetes    Primary osteoarthritis of right hip    end stage   Sleep apnea    no machine yet   Stroke [facility])     mild   SVT (supraventricular tachycardia) (HCC)    Wears dentures     Past Surgical History:  Procedure Laterality Date   BACK SURGERY     2007-2013 (low back)   ENDARTERECTOMY Right 06/02/2019   Procedure: ENDARTERECTOMY CAROTID RIGHT;  Surgeon: Gretta Lonni PARAS, MD;  Location: [facility] OR;  Service: Vascular;  Laterality: Right;   INTRAOPERATIVE ARTERIOGRAM  06/02/2019   Procedure: Right carotid and cerebral angiogram;  Surgeon: Gretta Lonni PARAS, MD;  Location: [facility] OR;  Service: Vascular;;   MULTIPLE TOOTH EXTRACTIONS     NECK SURGERY     2010   OPERATIVE ULTRASOUND Right 06/02/2019   Procedure: Operative Ultrasound;  Surgeon: Gretta Lonni PARAS, MD;  Location: [facility] OR;  Service: Vascular;  Laterality: Right;   TOTAL HIP ARTHROPLASTY Right 02/22/2023   Procedure: TOTAL HIP ARTHROPLASTY ANTERIOR APPROACH;  Surgeon: Fidel Rogue, MD;  Location: [facility]  ORS;  Service: Orthopedics;  Laterality: Right;  150    Current Outpatient Medications  Medication Instructions   atorvastatin  (LIPITOR) 80 mg, Oral, Daily   buprenorphine  (SUBUTEX ) 2 mg, Daily   carvedilol  (COREG ) 25 mg, Oral, 2 times daily with meals   clopidogrel  (PLAVIX ) 75 mg, Oral, Daily   diltiazem  (CARDIZEM  CD) 180 mg, Oral, Daily   ezetimibe  (ZETIA ) 10 mg, Oral, Daily   fluticasone  (FLONASE ) 50 MCG/ACT nasal spray 1 spray, Daily PRN   hydrALAZINE  (APRESOLINE ) 75 mg, Oral, 3 times daily   Multiple Vitamin (MULTIVITAMIN WITH MINERALS) TABS 1 tablet, Daily   spironolactone  (ALDACTONE ) 25 mg, Oral, Daily   valsartan  (DIOVAN ) 320 mg, Oral, Daily       Objective:   Physical Exam BP 130/80   Pulse 62   Temp 98.1 F (36.7 C) (Oral)   Resp 16   Ht 6' (1.829 m)   Wt 241 lb 8 oz (109.5 kg)   SpO2 97%   BMI 32.75 kg/m  General:   Well developed, NAD, BMI noted. HEENT:  Normocephalic . Face symmetric, atraumatic Lungs:  CTA B Normal respiratory effort, no intercostal retractions, no accessory muscle use. Heart: RRR,  no murmur.  Lower extremities: no pretibial edema bilaterally  Skin: Not pale. Not jaundice Neurologic:  alert & oriented X3.  Speech normal, gait appropriate for age and unassisted Psych--  Cognition and judgment appear intact.  Cooperative with normal attention span and concentration.  Behavior appropriate. No anxious or depressed appearing.      Assessment     Assessment DM: A1c 6.5 (08/2019) HTN Hyperlipidemia CV: --MRI- brain  02-2018: Remote infarct, that lead to further w/u, dx w/ carotid dz -- R endarterectomy 05/2019 ---had w/u for secondary HTN 2022 -- neg stress test 02/2021 --Atrial tachycardia , dx 04/2023  OSA Dx 02/2021.  Negative sleep study 05/2023, no CPAP. GERD Elevated TSH 2014 H/o gout  MSK:  -DJD hip --Chronic back pain + PPD 2000, s/p  antibiotics Increase LFTs: noted 04/2019, coincide w/ statin RX, AP slightly  elevated.U/S 02/2020: Fatty liver. 05/2020: Hep B negative, iron normal.   PLAN  DM: Diet controlled, check A1c. HTN: Ambulatory BPs in the 130s/80s, recently cardiology decreased hydralazine  dose d/t low BPs.  He also takes Diovan , Aldactone , Cardizem .  Check a CMP. Hyperlipidemia: On Lipitor, controlled. Increased LFTs: On and off, mostly alkaline phosphatase.  Rechecking LFTs, GGT AP isoenzymes. Minimal anemia: Noted on chart review, recheck a CBC and iron panel. OSA?  Negative sleep test 05/2023 Vaccine advised: consistently declined vaccines, has not changed his mind. RTC 05/2024 CPX

## 2023-12-31 NOTE — Progress Notes (Signed)
 Patient name: Mike Shepard MRN: [id number] DOB: Nov 24, 1966 Sex: male  REASON FOR VISIT: 1 year follow-up for surveillance carotid artery disease   HPI: Mike Shepard is a 58 y.o. male that presents for 1 year interval follow-up after right carotid endarterectomy on 06/02/2019 for an asymptomatic high-grade stenosis.  At the time of his carotid endarterectomy I had a thump signal in the ICA at completion as also noted on preoperative duplex.  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  His right ICA later had a silent occlusion on subsequent follow-up.  Today he reports no issues over the last 1 year.  He's had no neurologic events.  He is working for the city of [place].  He does take aspirin  plavix  statin daily.        Past Medical History:  Diagnosis Date   Aortic atherosclerosis (HCC)    Carotid artery stenosis    s/p RCEA now with occluded RICA and 1-39% LICA stenosis by dopplers 10/2020   Chronic back pain    see surgeries    GERD (gastroesophageal reflux disease)    H/O: gout    Hyperlipidemia    Hypertension    PPD positive    Positive PPD, remotely ~2000, s/p Rx     Pre-diabetes    Primary osteoarthritis of right hip    end stage   Sleep apnea    no machine yet   Stroke [facility])     mild   SVT (supraventricular tachycardia) (HCC)    Wears dentures     Past Surgical History:  Procedure Laterality Date   BACK SURGERY     2007-2013 (low back)   ENDARTERECTOMY Right 06/02/2019   Procedure: ENDARTERECTOMY CAROTID RIGHT;  Surgeon: Gretta Lonni PARAS, MD;  Location: [facility] OR;  Service: Vascular;  Laterality: Right;   INTRAOPERATIVE ARTERIOGRAM  06/02/2019   Procedure: Right carotid and cerebral angiogram;  Surgeon: Gretta Lonni PARAS, MD;  Location: [facility] OR;  Service: Vascular;;    MULTIPLE TOOTH EXTRACTIONS     NECK SURGERY     2010   OPERATIVE ULTRASOUND Right 06/02/2019   Procedure: Operative Ultrasound;  Surgeon: Gretta Lonni PARAS, MD;  Location[facility] OR;  Service: Vascular;  Laterality: Right;   TOTAL HIP ARTHROPLASTY Right 02/22/2023   Procedure: TOTAL HIP ARTHROPLASTY ANTERIOR APPROACH;  Surgeon: Fidel Rogue, MD;  Location: [facility] ORS;  Service: Orthopedics;  Laterality: Right;  150    Family History  Problem Relation Age of Onset   Hypertension Mother        M,F,Sis, bro   Diabetes Father 41       CAD, died of MI   Diabetes Sister    Hypertension Sister    Diabetes Brother    Hypertension Brother    Prostate cancer Paternal Grandfather        dx ~ 108 y/o   Colon cancer Paternal Uncle    Anesthesia problems Neg Hx    CAD Neg Hx    Stroke Neg Hx    Stomach cancer Neg Hx    Esophageal cancer Neg Hx    Pancreatic cancer Neg Hx    Adrenal disorder Neg Hx     SOCIAL HISTORY: Social History   Tobacco Use   Smoking status: Former    Current packs/day: 0.00    Types: Cigarettes  Quit date: 01/27/2010    Years since quitting: 13.9   Smokeless tobacco: Current    Types: Snuff   Tobacco comments:    Quit tobacco, used to smoke 1 ppd ~ 24 years, quit ~ 2014    Dips, rec to see dentist!  Substance Use Topics   Alcohol  use: Never    No Known Allergies  Current Outpatient Medications  Medication Sig Dispense Refill   atorvastatin  (LIPITOR) 80 MG tablet Take 1 tablet (80 mg total) by mouth daily. 90 tablet 1   buprenorphine  (SUBUTEX ) 2 MG SUBL SL tablet Place 2 mg under the tongue daily.     carvedilol  (COREG ) 25 MG tablet TAKE 1 TABLET BY MOUTH TWICE DAILY WITH A MEAL 180 tablet 0   clopidogrel  (PLAVIX ) 75 MG tablet Take 1 tablet (75 mg total) by mouth daily. 90 tablet 1   diltiazem  (CARDIZEM  CD) 180 MG 24 hr capsule Take 1 capsule (180 mg total) by mouth daily. 90 capsule 3   ezetimibe  (ZETIA ) 10 MG tablet Take 1 tablet (10 mg total) by mouth  daily. 90 tablet 3   fluticasone  (FLONASE ) 50 MCG/ACT nasal spray Place 1 spray into both nostrils daily as needed for allergies or rhinitis.     hydrALAZINE  (APRESOLINE ) 50 MG tablet Take 1.5 tablets (75 mg total) by mouth 3 (three) times daily. 415 tablet 2   Multiple Vitamin (MULTIVITAMIN WITH MINERALS) TABS Take 1 tablet by mouth daily.     spironolactone  (ALDACTONE ) 25 MG tablet Take 1 tablet (25 mg total) by mouth daily. 90 tablet 3   valsartan  (DIOVAN ) 320 MG tablet Take 1 tablet (320 mg total) by mouth daily. 90 tablet 1   No current facility-administered medications for this visit.    REVIEW OF SYSTEMS:  [X]  denotes positive finding, [ ]  denotes negative finding Cardiac  Comments:  Chest pain or chest pressure:    Shortness of breath upon exertion:    Short of breath when lying flat:    Irregular heart rhythm:        Vascular    Pain in calf, thigh, or hip brought on by ambulation:    Pain in feet at night that wakes you up from your sleep:     Blood clot in your veins:    Leg swelling:         Pulmonary    Oxygen at home:    Productive cough:     Wheezing:         Neurologic    Sudden weakness in arms or legs:     Sudden numbness in arms or legs:     Sudden onset of difficulty speaking or slurred speech:    Temporary loss of vision in one eye:     Problems with dizziness:         Gastrointestinal    Blood in stool:     Vomited blood:         Genitourinary    Burning when urinating:     Blood in urine:        Psychiatric    Major depression:         Hematologic    Bleeding problems:    Problems with blood clotting too easily:        Skin    Rashes or ulcers:        Constitutional    Fever or chills:      PHYSICAL EXAM: There were no vitals filed for this visit.  GENERAL: The patient is a well-nourished male, in no acute distress. The vital signs are documented above. CARDIAC: There is a regular rate and rhythm.  VASCULAR:  Right neck incision  well healed CN II-XII grossly intact No other appreciable neurologic deficits   DATA:   Carotid duplex today shows right ICA is occluded as previously demonstrated and left ICA has minimal 1-39% stenosis.  Assessment/Plan:  58 year old male that presents for ongoing surveillance and interval one year follow-up after right carotid endarterectomy on 06/02/2019 for an asymptomatic high-grade stenosis.  As noted above his right ICA occluded several years ago on subsequent follow-up likely due to tandem intracranial disease with limited outflow.  As previously noted at the time of his carotid endarterectomy we had a thumping signal in the ICA that was noted on preoperative duplex and an intraoperative angiogram was obtained that showed a patent carotid endarterectomy site and a patent ICA up to the skull base but he had multiple tandem high-grade intracranial lesions distally.   This was a silent occlusion and again discussed no indication for revascularization with occluded ICA at this time.  We are following his contralateral disease.  This remains minimal at 1 to 39% today.  Discussed continue aspirin  plavix  statin for risk reduction and I will see him again in 1 year with carotid duplex.  Discussed he call with questions or concerns.    Lonni DOROTHA Gaskins, MD [facility] Office: [phone]

## 2024-01-01 ENCOUNTER — Ambulatory Visit: Payer: 59 | Admitting: Vascular Surgery

## 2024-01-01 ENCOUNTER — Encounter: Payer: Self-pay | Admitting: Vascular Surgery

## 2024-01-01 ENCOUNTER — Ambulatory Visit (HOSPITAL_COMMUNITY)
Admission: RE | Admit: 2024-01-01 | Discharge: 2024-01-01 | Disposition: A | Discharge status: Home / Self Care | Payer: 59 | Source: Ambulatory Visit | Attending: Vascular Surgery | Admitting: Vascular Surgery

## 2024-01-01 VITALS — BP 139/80 | HR 57 | Temp 98.5°F | Resp 18 | Ht 72.0 in | Wt 246.7 lb

## 2024-01-01 DIAGNOSIS — I6523 Occlusion and stenosis of bilateral carotid arteries: Secondary | ICD-10-CM | POA: Diagnosis present

## 2024-01-08 ENCOUNTER — Other Ambulatory Visit: Payer: Self-pay

## 2024-01-08 DIAGNOSIS — I6523 Occlusion and stenosis of bilateral carotid arteries: Secondary | ICD-10-CM

## 2024-02-27 ENCOUNTER — Encounter: Payer: Self-pay | Admitting: Internal Medicine

## 2024-03-04 ENCOUNTER — Emergency Department (HOSPITAL_COMMUNITY)

## 2024-03-04 ENCOUNTER — Inpatient Hospital Stay (HOSPITAL_COMMUNITY)

## 2024-03-04 ENCOUNTER — Inpatient Hospital Stay (HOSPITAL_COMMUNITY)
Admission: EM | Admit: 2024-03-04 | Discharge: 2024-03-18 | DRG: 023 | Disposition: E | Attending: Neurology | Admitting: Neurology

## 2024-03-04 DIAGNOSIS — J9600 Acute respiratory failure, unspecified whether with hypoxia or hypercapnia: Secondary | ICD-10-CM | POA: Diagnosis present

## 2024-03-04 DIAGNOSIS — G4733 Obstructive sleep apnea (adult) (pediatric): Secondary | ICD-10-CM | POA: Diagnosis present

## 2024-03-04 DIAGNOSIS — Z833 Family history of diabetes mellitus: Secondary | ICD-10-CM | POA: Diagnosis not present

## 2024-03-04 DIAGNOSIS — Z8673 Personal history of transient ischemic attack (TIA), and cerebral infarction without residual deficits: Secondary | ICD-10-CM

## 2024-03-04 DIAGNOSIS — H5702 Anisocoria: Secondary | ICD-10-CM | POA: Diagnosis present

## 2024-03-04 DIAGNOSIS — R1310 Dysphagia, unspecified: Secondary | ICD-10-CM | POA: Diagnosis present

## 2024-03-04 DIAGNOSIS — I6200 Nontraumatic subdural hemorrhage, unspecified: Secondary | ICD-10-CM | POA: Diagnosis present

## 2024-03-04 DIAGNOSIS — I161 Hypertensive emergency: Secondary | ICD-10-CM | POA: Diagnosis present

## 2024-03-04 DIAGNOSIS — G936 Cerebral edema: Secondary | ICD-10-CM | POA: Diagnosis present

## 2024-03-04 DIAGNOSIS — R29735 NIHSS score 35: Secondary | ICD-10-CM | POA: Diagnosis not present

## 2024-03-04 DIAGNOSIS — Z66 Do not resuscitate: Secondary | ICD-10-CM | POA: Diagnosis not present

## 2024-03-04 DIAGNOSIS — Z96641 Presence of right artificial hip joint: Secondary | ICD-10-CM | POA: Diagnosis present

## 2024-03-04 DIAGNOSIS — Z8249 Family history of ischemic heart disease and other diseases of the circulatory system: Secondary | ICD-10-CM | POA: Diagnosis not present

## 2024-03-04 DIAGNOSIS — J9601 Acute respiratory failure with hypoxia: Secondary | ICD-10-CM | POA: Diagnosis not present

## 2024-03-04 DIAGNOSIS — H5704 Mydriasis: Secondary | ICD-10-CM | POA: Diagnosis present

## 2024-03-04 DIAGNOSIS — R7303 Prediabetes: Secondary | ICD-10-CM | POA: Diagnosis present

## 2024-03-04 DIAGNOSIS — R001 Bradycardia, unspecified: Secondary | ICD-10-CM | POA: Diagnosis present

## 2024-03-04 DIAGNOSIS — I615 Nontraumatic intracerebral hemorrhage, intraventricular: Principal | ICD-10-CM | POA: Diagnosis present

## 2024-03-04 DIAGNOSIS — E785 Hyperlipidemia, unspecified: Secondary | ICD-10-CM | POA: Diagnosis present

## 2024-03-04 DIAGNOSIS — R29730 NIHSS score 30: Secondary | ICD-10-CM | POA: Diagnosis present

## 2024-03-04 DIAGNOSIS — G935 Compression of brain: Secondary | ICD-10-CM | POA: Diagnosis present

## 2024-03-04 DIAGNOSIS — Z7982 Long term (current) use of aspirin: Secondary | ICD-10-CM

## 2024-03-04 DIAGNOSIS — Z79899 Other long term (current) drug therapy: Secondary | ICD-10-CM

## 2024-03-04 DIAGNOSIS — Z6831 Body mass index (BMI) 31.0-31.9, adult: Secondary | ICD-10-CM

## 2024-03-04 DIAGNOSIS — I10 Essential (primary) hypertension: Secondary | ICD-10-CM | POA: Diagnosis present

## 2024-03-04 DIAGNOSIS — Z7902 Long term (current) use of antithrombotics/antiplatelets: Secondary | ICD-10-CM

## 2024-03-04 DIAGNOSIS — Z515 Encounter for palliative care: Secondary | ICD-10-CM

## 2024-03-04 DIAGNOSIS — R578 Other shock: Secondary | ICD-10-CM | POA: Diagnosis not present

## 2024-03-04 DIAGNOSIS — I629 Nontraumatic intracranial hemorrhage, unspecified: Principal | ICD-10-CM

## 2024-03-04 DIAGNOSIS — G8929 Other chronic pain: Secondary | ICD-10-CM | POA: Diagnosis present

## 2024-03-04 DIAGNOSIS — G919 Hydrocephalus, unspecified: Secondary | ICD-10-CM | POA: Diagnosis present

## 2024-03-04 DIAGNOSIS — Z87891 Personal history of nicotine dependence: Secondary | ICD-10-CM | POA: Diagnosis not present

## 2024-03-04 DIAGNOSIS — E669 Obesity, unspecified: Secondary | ICD-10-CM | POA: Diagnosis present

## 2024-03-04 DIAGNOSIS — I6521 Occlusion and stenosis of right carotid artery: Secondary | ICD-10-CM | POA: Diagnosis present

## 2024-03-04 DIAGNOSIS — K219 Gastro-esophageal reflux disease without esophagitis: Secondary | ICD-10-CM | POA: Diagnosis present

## 2024-03-04 DIAGNOSIS — M1611 Unilateral primary osteoarthritis, right hip: Secondary | ICD-10-CM | POA: Diagnosis present

## 2024-03-04 DIAGNOSIS — Z8042 Family history of malignant neoplasm of prostate: Secondary | ICD-10-CM

## 2024-03-04 DIAGNOSIS — Z800 Family history of malignant neoplasm of digestive organs: Secondary | ICD-10-CM

## 2024-03-04 LAB — I-STAT ARTERIAL BLOOD GAS, ED
Acid-base deficit: 2 mmol/L (ref 0.0–2.0)
Bicarbonate: 23.6 mmol/L (ref 20.0–28.0)
Calcium, Ion: 1.19 mmol/L (ref 1.15–1.40)
HCT: 38 % — ABNORMAL LOW (ref 39.0–52.0)
Hemoglobin: 12.9 g/dL — ABNORMAL LOW (ref 13.0–17.0)
O2 Saturation: 100 %
Patient temperature: 93.8
Potassium: 3.9 mmol/L (ref 3.5–5.1)
Sodium: 134 mmol/L — ABNORMAL LOW (ref 135–145)
TCO2: 25 mmol/L (ref 22–32)
pCO2 arterial: 36.5 mmHg (ref 32–48)
pH, Arterial: 7.406 (ref 7.35–7.45)
pO2, Arterial: 285 mmHg — ABNORMAL HIGH (ref 83–108)

## 2024-03-04 LAB — HIV ANTIBODY (ROUTINE TESTING W REFLEX): HIV Screen 4th Generation wRfx: NONREACTIVE

## 2024-03-04 LAB — CBC
HCT: 39.5 % (ref 39.0–52.0)
Hemoglobin: 13.6 g/dL (ref 13.0–17.0)
MCH: 28.9 pg (ref 26.0–34.0)
MCHC: 34.4 g/dL (ref 30.0–36.0)
MCV: 84 fL (ref 80.0–100.0)
Platelets: 304 10*3/uL (ref 150–400)
RBC: 4.7 MIL/uL (ref 4.22–5.81)
RDW: 12.4 % (ref 11.5–15.5)
WBC: 10.4 10*3/uL (ref 4.0–10.5)
nRBC: 0 % (ref 0.0–0.2)

## 2024-03-04 LAB — COMPREHENSIVE METABOLIC PANEL
ALT: 19 U/L (ref 0–44)
AST: 23 U/L (ref 15–41)
Albumin: 4 g/dL (ref 3.5–5.0)
Alkaline Phosphatase: 100 U/L (ref 38–126)
Anion gap: 7 (ref 5–15)
BUN: 15 mg/dL (ref 6–20)
CO2: 23 mmol/L (ref 22–32)
Calcium: 8.9 mg/dL (ref 8.9–10.3)
Chloride: 103 mmol/L (ref 98–111)
Creatinine, Ser: 1.23 mg/dL (ref 0.61–1.24)
GFR, Estimated: >60 mL/min (ref >60)
Glucose, Bld: 171 mg/dL — ABNORMAL HIGH (ref 70–99)
Potassium: 3.7 mmol/L (ref 3.5–5.1)
Sodium: 133 mmol/L — ABNORMAL LOW (ref 135–145)
Total Bilirubin: 1 mg/dL (ref 0.0–1.2)
Total Protein: 7.4 g/dL (ref 6.5–8.1)

## 2024-03-04 LAB — SODIUM, URINE, RANDOM: Sodium, Ur: 12 mmol/L

## 2024-03-04 LAB — I-STAT CHEM 8, ED
BUN: 16 mg/dL (ref 6–20)
Calcium, Ion: 1.1 mmol/L — ABNORMAL LOW (ref 1.15–1.40)
Chloride: 103 mmol/L (ref 98–111)
Creatinine, Ser: 1.3 mg/dL — ABNORMAL HIGH (ref 0.61–1.24)
Glucose, Bld: 172 mg/dL — ABNORMAL HIGH (ref 70–99)
HCT: 41 % (ref 39.0–52.0)
Hemoglobin: 13.9 g/dL (ref 13.0–17.0)
Potassium: 3.8 mmol/L (ref 3.5–5.1)
Sodium: 135 mmol/L (ref 135–145)
TCO2: 22 mmol/L (ref 22–32)

## 2024-03-04 LAB — URINALYSIS, ROUTINE W REFLEX MICROSCOPIC
Bacteria, UA: NONE SEEN
Bilirubin Urine: NEGATIVE
Glucose, UA: NEGATIVE mg/dL
Ketones, ur: NEGATIVE mg/dL
Leukocytes,Ua: NEGATIVE
Nitrite: NEGATIVE
Protein, ur: NEGATIVE mg/dL
Specific Gravity, Urine: 1.004 — ABNORMAL LOW (ref 1.005–1.030)
pH: 6 (ref 5.0–8.0)

## 2024-03-04 LAB — PROTIME-INR
INR: 1 (ref 0.8–1.2)
Prothrombin Time: 13.6 s (ref 11.4–15.2)

## 2024-03-04 LAB — DIFFERENTIAL
Abs Immature Granulocytes: 0.03 10*3/uL (ref 0.00–0.07)
Basophils Absolute: 0.1 10*3/uL (ref 0.0–0.1)
Basophils Relative: 1 %
Eosinophils Absolute: 0.3 10*3/uL (ref 0.0–0.5)
Eosinophils Relative: 3 %
Immature Granulocytes: 0 %
Lymphocytes Relative: 41 %
Lymphs Abs: 4.2 10*3/uL — ABNORMAL HIGH (ref 0.7–4.0)
Monocytes Absolute: 0.9 10*3/uL (ref 0.1–1.0)
Monocytes Relative: 8 %
Neutro Abs: 4.9 10*3/uL (ref 1.7–7.7)
Neutrophils Relative %: 47 %

## 2024-03-04 LAB — MRSA NEXT GEN BY PCR, NASAL: MRSA by PCR Next Gen: NOT DETECTED

## 2024-03-04 LAB — APTT: aPTT: 29 s (ref 24–36)

## 2024-03-04 LAB — CBG MONITORING, ED: Glucose-Capillary: 170 mg/dL — ABNORMAL HIGH (ref 70–99)

## 2024-03-04 LAB — SODIUM
Sodium: 134 mmol/L — ABNORMAL LOW (ref 135–145)
Sodium: 142 mmol/L (ref 135–145)

## 2024-03-04 LAB — ETHANOL: Alcohol, Ethyl (B): <10 mg/dL (ref <10)

## 2024-03-04 MED ORDER — PROPOFOL 1000 MG/100ML IV EMUL
0.0000 ug/kg/min | INTRAVENOUS | Status: DC
Start: 2024-03-04 — End: 2024-03-05
  Administered 2024-03-04: 25 ug/kg/min via INTRAVENOUS
  Administered 2024-03-04: 5 ug/kg/min via INTRAVENOUS

## 2024-03-04 MED ORDER — IOHEXOL 350 MG/ML SOLN
75.0000 mL | Freq: Once | INTRAVENOUS | Status: AC | PRN
  Administered 2024-03-04: 75 mL via INTRAVENOUS

## 2024-03-04 MED ORDER — ACETAMINOPHEN 160 MG/5ML PO SOLN: 650.0000 mg | ORAL | Status: DC | PRN

## 2024-03-04 MED ORDER — MIDAZOLAM HCL 2 MG/2ML IJ SOLN
INTRAMUSCULAR | Status: AC
  Filled 2024-03-04: qty 2

## 2024-03-04 MED ORDER — ATROPINE SULFATE 1 MG/ML IV SOLN: 1.0000 mg | Freq: Once | INTRAVENOUS | Status: DC

## 2024-03-04 MED ORDER — ORAL CARE MOUTH RINSE: 15.0000 mL | OROMUCOSAL | Status: DC | PRN

## 2024-03-04 MED ORDER — LIDOCAINE HCL (PF) 1 % IJ SOLN
INTRAMUSCULAR | Status: AC
  Administered 2024-03-04: 30 mL
  Filled 2024-03-04: qty 30

## 2024-03-04 MED ORDER — CLEVIDIPINE BUTYRATE 0.5 MG/ML IV EMUL
0.0000 mg/h | INTRAVENOUS | Status: DC
  Administered 2024-03-04 (×2): 2 mg/h via INTRAVENOUS

## 2024-03-04 MED ORDER — PANTOPRAZOLE SODIUM 40 MG IV SOLR: 40.0000 mg | Freq: Every day | INTRAVENOUS | Status: DC

## 2024-03-04 MED ORDER — SENNOSIDES-DOCUSATE SODIUM 8.6-50 MG PO TABS: 1.0000 | ORAL_TABLET | Freq: Two times a day (BID) | ORAL | Status: DC

## 2024-03-04 MED ORDER — DESMOPRESSIN ACETATE 4 MCG/ML IJ SOLN
4.0000 ug | Freq: Once | INTRAMUSCULAR | Status: AC
  Administered 2024-03-04: 4 ug via INTRAVENOUS
  Filled 2024-03-04: qty 1

## 2024-03-04 MED ORDER — NOREPINEPHRINE 4 MG/250ML-% IV SOLN
0.0000 ug/min | INTRAVENOUS | Status: DC
  Administered 2024-03-04: 2 ug/min via INTRAVENOUS
  Administered 2024-03-05: 6 ug/min via INTRAVENOUS
  Filled 2024-03-04: qty 250

## 2024-03-04 MED ORDER — ORAL CARE MOUTH RINSE
15.0000 mL | OROMUCOSAL | Status: DC
  Administered 2024-03-04 – 2024-03-05 (×9): 15 mL via OROMUCOSAL

## 2024-03-04 MED ORDER — ATROPINE SULFATE 1 MG/10ML IJ SOSY
PREFILLED_SYRINGE | INTRAMUSCULAR | Status: AC
  Filled 2024-03-04: qty 10

## 2024-03-04 MED ORDER — STROKE: EARLY STAGES OF RECOVERY BOOK
Freq: Once | Status: AC
  Filled 2024-03-04: qty 1

## 2024-03-04 MED ORDER — ACETAMINOPHEN 325 MG PO TABS: 650.0000 mg | ORAL_TABLET | ORAL | Status: DC | PRN

## 2024-03-04 MED ORDER — MIDAZOLAM HCL 2 MG/2ML IJ SOLN
5.0000 mg | Freq: Once | INTRAMUSCULAR | Status: AC
  Administered 2024-03-04: 5 mg via INTRAVENOUS

## 2024-03-04 MED ORDER — PROPOFOL 1000 MG/100ML IV EMUL
INTRAVENOUS | Status: AC
  Filled 2024-03-04: qty 100

## 2024-03-04 MED ORDER — SODIUM CHLORIDE 23.4 % INJECTION (4 MEQ/ML) FOR IV ADMINISTRATION
120.0000 meq | Freq: Once | INTRAVENOUS | Status: AC
  Administered 2024-03-04: 120 meq via INTRAVENOUS
  Filled 2024-03-04: qty 30

## 2024-03-04 MED ORDER — CHLORHEXIDINE GLUCONATE CLOTH 2 % EX PADS
6.0000 | MEDICATED_PAD | Freq: Every day | CUTANEOUS | Status: DC
  Administered 2024-03-04 – 2024-03-05 (×2): 6 via TOPICAL

## 2024-03-04 MED ORDER — LEVETIRACETAM IN NACL 1500 MG/100ML IV SOLN
3000.0000 mg | Freq: Once | INTRAVENOUS | Status: AC
  Administered 2024-03-04: 3000 mg via INTRAVENOUS
  Filled 2024-03-04: qty 200

## 2024-03-04 MED ORDER — IPRATROPIUM-ALBUTEROL 0.5-2.5 (3) MG/3ML IN SOLN: 3.0000 mL | Freq: Four times a day (QID) | RESPIRATORY_TRACT | Status: DC | PRN

## 2024-03-04 MED ORDER — CEFAZOLIN SODIUM-DEXTROSE 1-4 GM/50ML-% IV SOLN
1.0000 g | Freq: Three times a day (TID) | INTRAVENOUS | Status: AC
  Administered 2024-03-05 (×2): 1 g via INTRAVENOUS
  Filled 2024-03-04 (×3): qty 50

## 2024-03-04 MED ORDER — ACETAMINOPHEN 650 MG RE SUPP: 650.0000 mg | RECTAL | Status: DC | PRN

## 2024-03-04 MED ORDER — SODIUM CHLORIDE 0.9% FLUSH
3.0000 mL | Freq: Once | INTRAVENOUS | Status: AC
  Administered 2024-03-04: 3 mL via INTRAVENOUS

## 2024-03-04 MED ORDER — PROPOFOL 1000 MG/100ML IV EMUL: 0.0000 ug/kg/min | INTRAVENOUS | Status: DC

## 2024-03-04 MED ORDER — LEVETIRACETAM IN NACL 500 MG/100ML IV SOLN
500.0000 mg | Freq: Two times a day (BID) | INTRAVENOUS | Status: DC
  Administered 2024-03-05 (×2): 500 mg via INTRAVENOUS
  Filled 2024-03-04 (×2): qty 100

## 2024-03-04 MED ORDER — SODIUM CHLORIDE 0.9% IV SOLUTION: Freq: Once | INTRAVENOUS | Status: AC

## 2024-03-04 MED ORDER — SODIUM CHLORIDE 3 % IV SOLN
INTRAVENOUS | Status: DC
  Filled 2024-03-04 (×4): qty 500

## 2024-03-04 MED ORDER — ATROPINE SULFATE 1 MG/10ML IJ SOSY
1.0000 mg | PREFILLED_SYRINGE | Freq: Once | INTRAMUSCULAR | Status: AC
  Administered 2024-03-04: 1 mg via INTRAVENOUS

## 2024-03-04 MED ORDER — CEFAZOLIN SODIUM-DEXTROSE 2-4 GM/100ML-% IV SOLN
2.0000 g | Freq: Once | INTRAVENOUS | Status: AC
  Administered 2024-03-04: 2 g via INTRAVENOUS
  Filled 2024-03-04: qty 100

## 2024-03-04 MED ORDER — SODIUM CHLORIDE 0.9 % IV BOLUS
1000.0000 mL | Freq: Once | INTRAVENOUS | Status: AC
  Administered 2024-03-04: 1000 mL via INTRAVENOUS

## 2024-03-04 NOTE — Progress Notes (Signed)
 Patient transported from ED #4 to 4N26 with no complications noted.

## 2024-03-04 NOTE — Code Documentation (Signed)
 Stroke Response Nurse Documentation Code Documentation  Mike Shepard is a 58 y.o. male arriving to [facility]  via [facility] on 03/04/2024 with past medical hx of carotid endardectomy, stroke, opiod use, HTN. On aspirin 81 mg daily and clopidogrel 75 mg daily. Code stroke was activated by EMS.   Patient from work where he was LKW at 1230 and now complaining of unresponsive.Per EMS, patient was at work when he told coworkers he didn't feel well and they witnessed him fall.   Stroke team at the bedside on patient arrival. Labs drawn and patient cleared for CT by Dr. Julieanne Manson. Patient to CT with team. NIHSS 30, see documentation for details and code stroke times. Patient with decreased LOC, disoriented, not following commands, bilateral arm weakness, bilateral leg weakness, bilateral decreased sensation, and Global aphasia  on exam. The following imaging was completed:  CT Head and CTA. Patient is not a candidate for IV Thrombolytic due to hemorrhage. Patient is not a candidate for IR due to hemorrhage.   Care Plan: VS/Pupils/NIH q1hour until order changes; SBP Goal 130-150.   Bedside handoff with ED RN Rodney Booze.    Felecia Jan  Stroke Response RN

## 2024-03-04 NOTE — H&P (Addendum)
 NEUROLOGY ADMISSION HISTORY AND PHYSICAL   Date of service: March 04, 2024 Patient Name: Mike Shepard MRN:  [id number] DOB:  12/28/1965 Chief Complaint: "Unresponsiveness and left blown pupil following collapse at work" Requesting Provider: No att. providers found  History of Present Illness  NOLYN SWAB is a 58 y.o. male with PMHx aortic atherosclerosis, carotid artery stenosis s/p right CEA, chronic back pain, GERD, gout, HLD, HTN, positive PPD in the 2000's, right hip osteoarthritis, prediabetes, sleep apnea, mild stroke and SVT who presents to the ED via EMS as a Code Stroke after he collapsed and became unresponsive at work. He had been complaining of a headache, but no deficits were noticed by coworkers until he suddenly collapsed while walking with them and became unresponsive. LKN was 1230, which is the time he collapsed. BP on scene was 158/100 with HR of 50. O2 sats were normal, but due to sonorous breathing, EMS started him on an NRB mask. He continued to be unresponsive en route. No definite seizure activity noted, but per EMS he had flexor posturing of BUE for about 30-60 seconds without shaking that stopped with 5 mg IV Versed. An IO line was then placed. On arrival to the ED, he continued to be unresponsive with sonorous respirations. C-collar was in place and he was diaphoretic. Due to high llkelihood of being unable to protect his airway, he was emergently intubated prior to CT.   CT head reveals a large left parietal lobe ICH with extension to the lateral, 3rd and 4th ventricles with casting of the 4th ventricle and mass effect with left to right midline shift.    LKW: 1230 Modified rankin score: 0-Completely asymptomatic and back to baseline post- stroke ICH Score: 3  NIHSS components Score: Comment  1a Level of Conscious 0[]  1[]  2[]  3[x]      1b LOC Questions 0[]  1[]  2[x]       1c LOC Commands 0[]  1[]  2[x]       2 Best Gaze 0[x]  1[]  2[]       3 Visual 0[]  1[]  2[]   3[x]      4 Facial Palsy 0[]  1[x]  2[]  3[]      5a Motor Arm - left 0[]  1[]  2[]  3[x]  4[]  UN[]    5b Motor Arm - Right 0[]  1[]  2[]  3[x]  4[]  UN[]    6a Motor Leg - Left 0[]  1[]  2[]  3[x]  4[]  UN[]    6b Motor Leg - Right 0[]  1[]  2[]  3[x]  4[]  UN[]    7 Limb Ataxia 0[x]  1[]  2[]  3[]  UN[]     8 Sensory 0[]  1[]  2[x]  UN[]      9 Best Language 0[]  1[]  2[]  3[x]      10 Dysarthria 0[]  1[]  2[x]  UN[]      11 Extinct. and Inattention 0[x]  1[]  2[]       TOTAL:    30      ROS  Unable to ascertain due to obtundation.   Past History   Past Medical History:  Diagnosis Date   Aortic atherosclerosis (HCC)    Carotid artery stenosis    s/p RCEA now with occluded RICA and 1-39% LICA stenosis by dopplers 10/2020   Chronic back pain    see surgeries    GERD (gastroesophageal reflux disease)    H/O: gout    Hyperlipidemia    Hypertension    PPD positive    Positive PPD, remotely ~2000, s/p Rx     Pre-diabetes    Primary osteoarthritis of right hip    end stage   Sleep apnea  no machine yet   Stroke [facility])    " mild"   SVT (supraventricular tachycardia) (HCC)    Wears dentures     Past Surgical History:  Procedure Laterality Date   BACK SURGERY     2007-2013 (low back)   ENDARTERECTOMY Right 06/02/2019   Procedure: ENDARTERECTOMY CAROTID RIGHT;  Surgeon: Cephus Shelling, MD;  Location: [facility] OR;  Service: Vascular;  Laterality: Right;   INTRAOPERATIVE ARTERIOGRAM  06/02/2019   Procedure: Right carotid and cerebral angiogram;  Surgeon: Cephus Shelling, MD;  Location: [facility] OR;  Service: Vascular;;   MULTIPLE TOOTH EXTRACTIONS     NECK SURGERY     2010   OPERATIVE ULTRASOUND Right 06/02/2019   Procedure: Operative Ultrasound;  Surgeon: Cephus Shelling, MD;  Location: [facility] OR;  Service: Vascular;  Laterality: Right;   TOTAL HIP ARTHROPLASTY Right 02/22/2023   Procedure: TOTAL HIP ARTHROPLASTY ANTERIOR APPROACH;  Surgeon: Samson Frederic, MD;  Location: [facility] ORS;  Service: Orthopedics;  Laterality:  Right;  150    Family History: Family History  Problem Relation Age of Onset   Hypertension Mother        M,F,Sis, bro   Diabetes Father 12       CAD, died of MI   Diabetes Sister    Hypertension Sister    Diabetes Brother    Hypertension Brother    Prostate cancer Paternal Grandfather        dx ~ 57 y/o   Colon cancer Paternal Uncle    Anesthesia problems Neg Hx    CAD Neg Hx    Stroke Neg Hx    Stomach cancer Neg Hx    Esophageal cancer Neg Hx    Pancreatic cancer Neg Hx    Adrenal disorder Neg Hx     Social History  reports that he quit smoking about 14 years ago. His smoking use included cigarettes. His smokeless tobacco use includes snuff. He reports that he does not currently use drugs after having used the following drugs: Marijuana. He reports that he does not drink alcohol.  No Known Allergies  Medications   Current Facility-Administered Medications:    midazolam (VERSED) 2 MG/2ML injection, , , ,    sodium chloride flush (NS) 0.9 % injection 3 mL, 3 mL, Intravenous, Once, Tegeler, Canary Brim, MD  Current Outpatient Medications:    atorvastatin (LIPITOR) 80 MG tablet, Take 1 tablet (80 mg total) by mouth daily., Disp: 90 tablet, Rfl: 1   buprenorphine (SUBUTEX) 2 MG SUBL SL tablet, Place 2 mg under the tongue daily., Disp: , Rfl:    carvedilol (COREG) 25 MG tablet, TAKE 1 TABLET BY MOUTH TWICE DAILY WITH A MEAL, Disp: 180 tablet, Rfl: 0   clopidogrel (PLAVIX) 75 MG tablet, Take 1 tablet (75 mg total) by mouth daily., Disp: 90 tablet, Rfl: 1   diltiazem (CARDIZEM CD) 180 MG 24 hr capsule, Take 1 capsule (180 mg total) by mouth daily., Disp: 90 capsule, Rfl: 3   ezetimibe (ZETIA) 10 MG tablet, Take 1 tablet (10 mg total) by mouth daily., Disp: 90 tablet, Rfl: 3   fluticasone (FLONASE) 50 MCG/ACT nasal spray, Place 1 spray into both nostrils daily as needed for allergies or rhinitis., Disp: , Rfl:    hydrALAZINE (APRESOLINE) 50 MG tablet, Take 1.5 tablets (75 mg  total) by mouth 3 (three) times daily., Disp: 415 tablet, Rfl: 2   Multiple Vitamin (MULTIVITAMIN WITH MINERALS) TABS, Take 1 tablet by mouth daily., Disp: , Rfl:  spironolactone (ALDACTONE) 25 MG tablet, Take 1 tablet (25 mg total) by mouth daily., Disp: 90 tablet, Rfl: 3   valsartan (DIOVAN) 320 MG tablet, Take 1 tablet (320 mg total) by mouth daily., Disp: 90 tablet, Rfl: 1  Vitals  There were no vitals filed for this visit.  There is no height or weight on file to calculate BMI.  Physical Exam   Physical Exam  HEENT:  [place]/AT. C-collar in place Lungs: Respirations sonorous >>> emergently intubated prior to CT Extremities: Warm and well perfused.. IO line in place  Neurological Examination (performed prior to intubation) Mental Status: GCS is 5 (E1V1M3). Eyes closed and unresponsive to focal, touch and noxious stimuli. Weak flexior posturing of BUE as well as slight movement of RLE was seen in response to pinch and sternal rub Cranial Nerves: II: Right  pupil 4 mm and sluggishly reactive. Left pupil 8 mm and unreactive. No blink to threat bilaterally.  .   III,IV, VI: Eyes are closed. When eyelids are elevated, eyes are slightly exotropic and near the midline. No nystagmus.  V: Weak corneal on the right, no corneal reflex on the left.  VII: Decreased NL fold on the right. Does not grimace to noxious.  VIII: No response to voice IX,X: Gag reflex deferred XI: Head is preferentially rotated to the left XII: Not able to follow commands for assessment Motor: Weak flexior posturing of BUE as well as slight movement of RLE was seen in response to pinch and sternal rub. Flaccid BUE. Decreased tone to LLE. Mildly increased tone to RLE.  Deep Tendon Reflexes: 2+ bilateral patellars. Toes equivocal bilaterally Cerebellar/Gait: Unable to assess   Labs/Imaging/Neurodiagnostic studies   CBC:  Recent Labs  Lab 08-Mar-2024 1335  HGB 13.9  HCT 41.0   Basic Metabolic Panel:  Lab Results   Component Value Date   NA 135 2024/03/08   K 3.8 Mar 08, 2024   CO2 26 12/21/2023   GLUCOSE 172 (H) 03-08-2024   BUN 16 03/08/24   CREATININE 1.30 (H) 03-08-24   CALCIUM 9.4 12/21/2023   GFRNONAA >60 04/08/2023   GFRAA 90 01/21/2021   Lipid Panel:  Lab Results  Component Value Date   LDLCALC 46 05/09/2023   HgbA1c:  Lab Results  Component Value Date   HGBA1C 6.5 12/21/2023   Urine Drug Screen: No results found for: "LABOPIA", "COCAINSCRNUR", "LABBENZ", "AMPHETMU", "THCU", "LABBARB"  Alcohol Level No results found for: "ETH" INR  Lab Results  Component Value Date   INR 1.0 06/02/2019   APTT  Lab Results  Component Value Date   APTT 35 06/02/2019     ASSESSMENT  Zen CAILEN MIHALIK is a 58 y.o. male presenting to the ED via EMS as a Code Stroke after he collapsed and became unresponsive at work - Exam reveals an unresponsive patient with sonorous respirations, dilated and unreactive left pupil, sluggishly reactive right pupil, and weak posturing of BUE and RLE to noxious stimuli. - CT head: Acute intraparenchymal hematoma in the left parieto-occipital junction region measuring approximately 5 x 5 x 4 cm. Intraventricular extension into the atrium of the left lateral ventricle with intraventricular hemorrhage largely filling the left lateral ventricle, third ventricle and fourth ventricle and some blood within the right lateral ventricle. Subdural hematoma on the left along the lateral convexity with maximal thickness of 6 mm. This could be due to direct extension of the intraparenchymal hemorrhage or could relate to head trauma. Mass effect with left-to-right midline shift of 1.4 cm. No ventricular  trapping. Early uncal herniation on the left. - EKG: Sinus bradycardia; Abnormal R-wave progression, early transition - Overall impression: Acute left parietal lobe ICH with intraventricular and left subdural extension. There is significant mass effect and left to right midline  shift. The patient is critically ill and at risk for acute decompensation and death if he develops hydrocephalus or worsened brainstem compression. This has been discussed with the patient's family. Neurosurgery will need to evaluate the patient emergently.     RECOMMENDATIONS  - Admit to ICU under Neurology service - MRI brain - TTE - Cardiac telemetry - Frequent neuro checks - BP management with clevidipine drip. SBP goal of 130-150 - No antiplatelet medications or anticoagulants - DVT prophylaxis with SCDs - CCM has been consulted for vent management - Neurosurgery has been consulted for possible operative intervention. They have discussed the option of placement of an intraventricular drain and family has expressed their desire for this procedure to be done, although they have been informed that the patient's prognosis will remain poor even with ventricular drain placement. Neurosurgery is ordering a platelet transfusion prior to the procedure, as the patient is on home ASA and Plavix.  - Keppra 3000 mg IV load followed by 500 mg IV BID, given repeat posturing-like movements of BUE witnessed by nursing after initial Neurology evaluation.  - Repeat CT head in 6 hours to assess for stability (ordered for 9 PM) - Family would like full scope of care and for the patient to be full code.    Addendum: CTA of head and neck: Occlusion of the right ICA in the bulb. No flow demonstrated and until there is some reconstituted flow in the extreme ICA terminology. Flow can be seen in both anterior and middle cerebral vessels. Soft and calcified plaque at the left carotid bifurcation and proximal ICA. Minimal diameter of the proximal ICA 1 mm. Diameter of the distal ICA is reduced due to inflow reduction. The stenosis is consistent with an 80% or greater stenosis.   Addendum: - Notified that the patient is now bradycardic down to the low 40's - Patient examined with sedation on board. Bradycardia  confirmed. Left pupil now 5 mm and unreactive. Right pupil 4 mm and sluggishly to nonreactive. No posturing noted.  - Hypertonic saline emergent bolus followed by 50 cc/hr infusion have been ordered.  - Have spoken with Neurosurgery who state that they will see the patient for repeat evaluation emergently.   180 minutes spent in the emergent neurological evaluation and management of this critically ill patient. Time spent included review of multiple imaging studies, reassessments at the bedside, discussions with family and coordination of care with Neurosurgery, Radiology, ED Team and CCM.  ______________________________________________________________________    Dessa Phi, Laure Leone, MD [facility]

## 2024-03-04 NOTE — ED Notes (Signed)
 PT had a episode of bradycardia dropping in the 30s. CCMD contacted this RN and this RN told CCMD that I was aware and was bedside. ED physician was contacted and neurosurgeon was downstairs as well. Both came to bedside and I was directed to contact critical care RN. Critical care RN informed this RN that she was prescribing atropine and that she would be down shortly. PT given atropine and hr stabilized in the 80s. Pt then transported to 4N26 with RT, NT and trauma without any further bradycardia.

## 2024-03-04 NOTE — Progress Notes (Signed)
 On call neurology note  CTH done and reviewed - bleed stable but mass effect is progressive in the posterior fossa with mild transtentorial herniation.  Continue 3% saline and current plan   10 min cc time  -- Milon Dikes, MD Neurologist [facility]

## 2024-03-04 NOTE — Progress Notes (Signed)
 Patient arrived to unit with clothing cut off. It appears patient had a T-shirt, underwear, pants and work jumpsuit on. Cut clothing removed from patients bed and placed in patient's room closet. Patient had a wallet in his pocket that contained one state ID and multiple cards. There was no money found in wallet. Patient did NOT have a phone, keys, any jewelry or additional belonging items sent with him from the ED. Wallet placed back in patients pocket and placed in patients room closet.

## 2024-03-04 NOTE — Progress Notes (Signed)
 During transportation to the ICU, the patient unfortunately had an episode of bradycardia.  This was treated with atropine.  Upon reexamination, the patient now had bilateral fixed and dilated pupils with midline gaze.  We then proceeded with the external ventricular drain after beginning platelet transfusion.  I discussed with the family after placement of external ventricular drain that unfortunately this type of hemorrhage is a life ending event given his continued progression of neurologic decline.  He has a poor prognosis.  I answered all their questions, offered my condolences.  They verbalized understanding.

## 2024-03-04 NOTE — Progress Notes (Addendum)
 Notified neurosurgery that EVD is only draining 3 mL / hr of serous colored fluid.  When observed, it does not pulsate.  Pt's bed elevated and drain dropped to floor in hopes of improved output.  No changes noted.  Patrici Ranks, PA notified.  No new orders.   Also mentioned to Huntley Dec that pt's BP has been below parameters.  Pt's BP currently 118/74 (87).  NP said it was okay if pt's BP was below parameters.

## 2024-03-04 NOTE — ED Notes (Signed)
 Called ICU and gave report to Sabino Gasser, RN ready to receive PT.

## 2024-03-04 NOTE — IPAL (Signed)
 OVERNIGHT ON CALL NOTE  Talked to family who desire to make him DNR if his hear were to stop. Order placed for DNR in the chart. Continue current level of care for now. May consider withdrawal in due time but for now, family is still struggling with the acute situation, rightfully so.  -- Milon Dikes, MD Neurologist [facility]

## 2024-03-04 NOTE — Procedures (Signed)
   Providing Compassionate, Quality Care - Together  Date of service: 03/04/2024   PREOP DIAGNOSIS:  ICH with IVH, hydrocephalus   POSTOP DIAGNOSIS: Same  PROCEDURE: Right frontal external ventricular drain placement  SURGEON: Dr. Kendell Bane C. Shon Mansouri, DO  ASSISTANT: Patrici Ranks, PA  Anesthesia: local anesthetic  EBL: 30cc  SPECIMENS: None  DRAINS: External ventricular drain, open to drain at 10 mmHg  COMPLICATIONS: None  CONDITION: Hemodynamically stable, critical condition  HISTORY: Mike Shepard is a 58 y.o. male that presented with acute onset altered mental status.  Was found to have a large ICH with extensive intraventricular hemorrhage and hydrocephalus.  He had a poor neurologic status upon presentation which included a unilateral fixed pupil on the left with extensor posturing.  Due to these findings, I offered the family placement of external ventricular drain.  We discussed all risks, benefits and expected outcomes, emergent consent was obtained.  PROCEDURE IN DETAIL: The patient was in the ICU, intubated.  Timeout was performed.  The right frontal region and was clipped free of hair.  Local anesthetic was injected into the planned incision.  Kocher's point was mapped out.  Using a 15 blade, incision was made over Kocher's point down to the cranium.  Self-retaining retractor was placed in the wound.  Using a twist drill, Craney ostomy was performed.  Durotomy was performed with trocar.  The external ventricular drain was passed to a depth of approximately 7 cm at the external table, with positive flow of CSF at approximately 4 cm that was blood-tinged and under significant pressure.  This was then tunneled laterally and connected to an external drainage system.  Self-retaining retractor was taken out of the wound.  The wound was closed in layers with 2-0 nylon vertical mattress suture.  Sterile dressing was applied.  All sharps and sauce were accounted for and disposed  of appropriately.  The patient tolerated the procedure well and remained in critical condition.

## 2024-03-04 NOTE — Consult Note (Addendum)
   Providing Compassionate, Quality Care - Together  Neurosurgery Consult  Referring physician: Dr. Otelia Limes Reason for referral: ICH  Chief Complaint: AMS  History of Present Illness: This is a 58 year old male with history of hypertension, right carotid endarterectomy 2020, with chronic right ICA occlusion, with acute onset weakness and collapse at work.  EMS was called, and he was brought to the emergency department.  Code stroke was initiated, CT brain revealed large left intracerebral hemorrhage with extensive intraventricular hemorrhage.  Upon arrival, he was intubated for airway protection, was reportedly extensor posturing.  Per chart, he does take aspirin and Plavix daily.  Medications: I have reviewed the patient's current medications. Allergies: No Known Allergies  History reviewed. No pertinent family history. Social History:  has no history on file for tobacco use, alcohol use, and drug use.  ROS: Unable to obtain  Physical Exam:  Vital signs in last 24 hours: Temp:  [98 F (36.7 C)-98.3 F (36.8 C)] 98 F (36.7 C) (07/25 1814) Pulse Rate:  [58-128] 65 (07/26 0746) Resp:  [11-18] 14 (07/26 0217) BP: (138-182)/(65-125) 153/88 (07/26 0700) SpO2:  [91 %-98 %] 96 % (07/26 0746) PE: Intubated Eyes closed, midline gaze Right pupil pinpoint, sluggish Left pupil fixed, 7 mm Minimal extension posturing in the left upper extremity and pain No motor response in the right upper or lower extremity   Imaging: CT brain and CT angiogram reviewed.  There is chronic right ICA occlusion.  There is no spot sign, there is approximately 14 mm of left-to-right midline shift with extensive intraventricular hemorrhage, casting the fourth ventricle, bilateral foramen of Luschka, bilateral left greater than right lateral ventricles, with enlargement of the bilateral temporal horns there is extensive parieto-occipital intracerebral hemorrhage measuring 4.8 x 4.0 x 5.1 cm.  There are signs  of early uncal herniation.  There is small left-sided subdural hematoma approximately 6 mm, likely due to extension of intracerebral hemorrhage.  Impression/Assessment:  58 year old male with  Left temporoparietal intracerebral hemorrhage with extensive intraventricular hemorrhage, hydrocephalus  Plan:  -I had an extensive discussion with the family, and the wife and the mother.  I explained the severity of his current neurologic status as well as the amount of hemorrhage.  I do not recommend any neurosurgical intervention unfortunately given the extensive intraventricular extension and poor neurologic status.  I did offer them a external ventricular drain due to hydrocephalus although I did explain that there is high likelihood this will be minimally effective given the amount of intraventricular hemorrhage.  They would like to proceed with placement of external ventricular drain.  I explained that we will provide the patient with platelet transfusion given his use of aspirin and Plavix to prevent further hemorrhage.  We discussed risks and benefits -Supportive care -ICH pathway -SBP control, less than 160 -Keppra 500 mg twice daily -Guarded prognosis   Thank you for allowing me to participate in this patient's care.  Please do not hesitate to call with questions or concerns.   Monia Pouch, DO Neurosurgeon [facility] [phone]

## 2024-03-04 NOTE — Progress Notes (Signed)
 Patient taken to CT and back to 4N26 without event.

## 2024-03-04 NOTE — Progress Notes (Addendum)
 eLink Physician-Brief Progress Note Patient Name: Mike Shepard DOB: Sep 14, 1966 MRN: [id number]   Date of Service  03/04/2024  HPI/Events of Note  Mike Shepard is a 58 y.o. male with PMHx aortic atherosclerosis, carotid artery stenosis s/p right CEA, chronic back pain, GERD, gout, HLD, HTN, positive PPD in the 2000's, right hip osteoarthritis, prediabetes, sleep apnea, mild stroke and SVT who presents to the ED via EMS as a Code Stroke  found to have an ICH and interval cerebral edema. Grim prognosis.   Now developing hypotension. IO in place  eICU Interventions  Start Norepi   2331 -1 L bolus and DDAVP.  Patient continues to have 150-200 cc of urine output hourly, low urine sodium, low spec gravity consistent with NDI, urine OSM pending  Intervention Category Major Interventions: Shock - evaluation and management  Domenic Schoenberger 03/04/2024, 10:36 PM

## 2024-03-04 NOTE — ED Provider Notes (Signed)
 Mike Shepard   CSN: [id number] Arrival date & time: 03/04/24  1328     History  No chief complaint on file.   Mike Shepard is a 58 y.o. male.  The history is provided by the patient and medical records. No language interpreter was used.  Cerebrovascular Accident This is a new problem. The current episode started 1 to 2 hours ago. The problem occurs constantly. The problem has not changed since onset.Associated symptoms include headaches. Nothing aggravates the symptoms. Nothing relieves the symptoms. He has tried nothing for the symptoms. The treatment provided no relief.       Home Medications Prior to Admission medications   Medication Sig Start Date End Date Taking? Authorizing Provider  atorvastatin (LIPITOR) 80 MG tablet Take 1 tablet (80 mg total) by mouth daily. 06/15/23   Wanda Plump, MD  buprenorphine (SUBUTEX) 2 MG SUBL SL tablet Place 2 mg under the tongue daily.    [provider]  carvedilol (COREG) 25 MG tablet TAKE 1 TABLET BY MOUTH TWICE DAILY WITH A MEAL 12/17/23   Turner, Cornelious Bryant, MD  clopidogrel (PLAVIX) 75 MG tablet Take 1 tablet (75 mg total) by mouth daily. 08/21/23   Wanda Plump, MD  diltiazem (CARDIZEM CD) 180 MG 24 hr capsule Take 1 capsule (180 mg total) by mouth daily. 05/24/23   Quintella Reichert, MD  ezetimibe (ZETIA) 10 MG tablet Take 1 tablet (10 mg total) by mouth daily. 06/15/23   Wanda Plump, MD  fluticasone [facility]) 50 MCG/ACT nasal spray Place 1 spray into both nostrils daily as needed for allergies or rhinitis.    [provider]  hydrALAZINE (APRESOLINE) 50 MG tablet Take 1.5 tablets (75 mg total) by mouth 3 (three) times daily. 09/19/23   Quintella Reichert, MD  Multiple Vitamin (MULTIVITAMIN WITH MINERALS) TABS Take 1 tablet by mouth daily.    [provider]  spironolactone (ALDACTONE) 25 MG tablet Take 1 tablet (25 mg total) by mouth daily. 05/02/23   Quintella Reichert, MD  valsartan (DIOVAN) 320 MG tablet Take 1 tablet (320 mg total) by mouth daily. 11/28/23   Quintella Reichert, MD      Allergies    Patient has no known allergies.    Review of Systems   Review of Systems  Unable to perform ROS: Patient unresponsive  Neurological:  Positive for headaches.    Physical Exam Updated Vital Signs BP (!) 115/54   Pulse (!) 48   Resp 16   Ht 6' (1.829 m)   SpO2 96%   BMI 33.46 kg/m  Physical Exam Vitals and nursing Shepard reviewed.  Constitutional:      General: He is in acute distress.     Appearance: He is well-developed. He is ill-appearing.  Eyes:     Pupils: Pupils are unequal.     Left eye: Pupil is sluggish.     Comments: Left pupil is larger than the right.  Left pupil is 5 mm and sluggish and right pupil is 3 mm.  Cardiovascular:     Rate and Rhythm: Bradycardia present.  Pulmonary:     Effort: No accessory muscle usage.  Skin:    Capillary Refill: Capillary refill takes less than 2 seconds.  Neurological:     Mental Status: He is unresponsive.     GCS: GCS eye subscore is 1. GCS verbal subscore is 1. GCS motor subscore is 3.  Comments: Patient GCS is 5.  He is occasionally withdrawing to pain.  No seizure-like activity seen initially.  Pupils were asymmetric.  No blink reflex initially.       ED Results / Procedures / Treatments   Labs (all labs ordered are listed, but only abnormal results are displayed) Labs Reviewed  DIFFERENTIAL - Abnormal; Notable for the following components:      Result Value   Lymphs Abs 4.2 (*)    All other components within normal limits  COMPREHENSIVE METABOLIC PANEL - Abnormal; Notable for the following components:   Sodium 133 (*)    Glucose, Bld 171 (*)    All other components within normal limits  I-STAT CHEM 8, ED - Abnormal; Notable for the following components:   Creatinine, Ser 1.30 (*)    Glucose, Bld 172 (*)    Calcium, Ion 1.10 (*)    All other components within normal  limits  CBG MONITORING, ED - Abnormal; Notable for the following components:   Glucose-Capillary 170 (*)    All other components within normal limits  I-STAT ARTERIAL BLOOD GAS, ED - Abnormal; Notable for the following components:   pO2, Arterial 285 (*)    Sodium 134 (*)    HCT 38.0 (*)    Hemoglobin 12.9 (*)    All other components within normal limits  PROTIME-INR  APTT  CBC  ETHANOL  HIV ANTIBODY (ROUTINE TESTING W REFLEX)  BLOOD GAS, ARTERIAL  PREPARE PLATELET PHERESIS    EKG EKG Interpretation Date/Time:  Tuesday March 04 2024 13:38:15 EDT Ventricular Rate:  48 PR Interval:  158 QRS Duration:  111 QT Interval:  496 QTC Calculation: 444 R Axis:   56  Text Interpretation: Sinus bradycardia Abnormal R-wave progression, early transition when compared to prior, slower rate No STEMI Confirmed by Theda Belfast ([id number]) on 03/04/2024 1:46:36 PM  Radiology CT ANGIO HEAD NECK W WO CM (CODE STROKE) Result Date: 03/04/2024 CLINICAL DATA:  Acute intracranial hemorrhage EXAM: CT ANGIOGRAPHY HEAD AND NECK WITH AND WITHOUT CONTRAST TECHNIQUE: Multidetector CT imaging of the head and neck was performed using the standard protocol during bolus administration of intravenous contrast. Multiplanar CT image reconstructions and MIPs were obtained to evaluate the vascular anatomy. Carotid stenosis measurements (when applicable) are obtained utilizing NASCET criteria, using the distal internal carotid diameter as the denominator. RADIATION DOSE REDUCTION: This exam was performed according to the departmental dose-optimization program which includes automated exposure control, adjustment of the mA and/or kV according to patient size and/or use of iterative reconstruction technique. CONTRAST:  75mL OMNIPAQUE IOHEXOL 350 MG/ML SOLN COMPARISON:  Head CT medially prior FINDINGS: CTA NECK FINDINGS Aortic arch: Aortic atherosclerotic calcification. Branching pattern is normal. Right carotid system: Common  carotid artery widely patent to the bifurcation. Occlusion of the ICA in the bulb. Left carotid system: Common carotid artery widely patent to the bifurcation. Soft and calcified plaque at the bifurcation and proximal ICA. Minimal diameter of the proximal ICA 1 mm. Diameter of the distal ICA is reduced due to inflow reduction. The stenosis is consistent with an 80% or greater stenosis. Vertebral arteries: Both vertebral artery origins are widely patent. The left vertebral artery originates from the arch. Skeleton: Previous disc arthroplasty at C5-6. Other neck: No mass or lymphadenopathy. Enlarged heterogeneous thyroid. Upper chest: Lung apices are clear. Review of the MIP images confirms the above findings CTA HEAD FINDINGS Anterior circulation: The left internal carotid artery is patent through the skull base and siphon regions. There is  siphon atherosclerotic calcification but no stenosis greater than 30% in that segment. Right cervical ICA is occluded. No flow demonstrated and till there is some reconstituted flow in the extreme ICA terminus. Flow can be seen in both anterior and middle cerebral vessels. Posterior circulation: Both vertebral arteries are patent through the foramen magnum to the basilar artery. No basilar stenosis. Posterior circulation branch vessels are patent. Venous sinuses: Patent and normal. Anatomic variants: None other significant. Review of the MIP images confirms the above findings IMPRESSION: 1. Occlusion of the right ICA in the bulb. No flow demonstrated and until there is some reconstituted flow in the extreme ICA terminology. Flow can be seen in both anterior and middle cerebral vessels. 2. Soft and calcified plaque at the left carotid bifurcation and proximal ICA. Minimal diameter of the proximal ICA 1 mm. Diameter of the distal ICA is reduced due to inflow reduction. The stenosis is consistent with an 80% or greater stenosis. 3. These results were communicated to Dr. Otelia Limes at  3:08 pm on 03/04/2024 by text page via the [facility] messaging system. Electronically Signed   By: Paulina Fusi M.D.   On: 03/04/2024 15:12   CT HEAD CODE STROKE WO CONTRAST Result Date: 03/04/2024 CLINICAL DATA:  Code stroke. Neuro deficit, acute, stroke suspected. Patient unresponsive. EXAM: CT HEAD WITHOUT CONTRAST TECHNIQUE: Contiguous axial images were obtained from the base of the skull through the vertex without intravenous contrast. RADIATION DOSE REDUCTION: This exam was performed according to the departmental dose-optimization program which includes automated exposure control, adjustment of the mA and/or kV according to patient size and/or use of iterative reconstruction technique. COMPARISON:  MRI 03/16/2018.  CT angiography 05/26/2019. FINDINGS: Brain: Acute intraparenchymal hematoma in the left parieto-occipital junction region measuring approximately 5 x 5 x 4 cm. Intraventricular extension into the atrium of the left lateral ventricle with intraventricular hemorrhage largely filling the left lateral ventricle, third ventricle and fourth ventricle and some blood within the right lateral ventricle. Subdural hematoma on the left along the lateral convexity with maximal thickness of 6 mm. This could be due to direct extension of the intraparenchymal hemorrhage or could relate to head trauma. Subarachnoid hemorrhage is not demonstrated within the sulci on the left. There is mass effect with left-to-right midline shift of 1.4 cm. No ventricular trapping. Early uncal herniation on the left. Vascular: There is atherosclerotic calcification of the major vessels at the base of the brain. Skull: No skull fracture or focal bone finding. Sinuses/Orbits: Ordinary mild seasonal mucosal thickening. Orbits negative. Other: None IMPRESSION: 1. Acute intraparenchymal hematoma in the left parieto-occipital junction region measuring approximately 5 x 5 x 4 cm. Intraventricular extension into the atrium of the left lateral  ventricle with intraventricular hemorrhage largely filling the left lateral ventricle, third ventricle and fourth ventricle and some blood within the right lateral ventricle. 2. Subdural hematoma on the left along the lateral convexity with maximal thickness of 6 mm. This could be due to direct extension of the intraparenchymal hemorrhage or could relate to head trauma. 3. Mass effect with left-to-right midline shift of 1.4 cm. No ventricular trapping. Early uncal herniation on the left. 4. These results were communicated to Dr. Otelia Limes at 3:02 pm on 03/04/2024 by text page via the [facility] messaging system. Electronically Signed   By: Paulina Fusi M.D.   On: 03/04/2024 15:02    Procedures Procedure Name: Intubation Date/Time: 03/04/2024 2:42 PM  Performed by: Heide Scales, MDPre-anesthesia Checklist: Patient identified, Emergency Drugs available, Patient being  monitored and Timeout performed Oxygen Delivery Method: Ambu bag Preoxygenation: Pre-oxygenation with 100% oxygen Induction Type: Rapid sequence Laryngoscope Size: Glidescope Grade View: Grade I Tube size: 7.5 mm Number of attempts: 1 Placement Confirmation: ETT inserted through vocal cords under direct vision, CO2 detector, Breath sounds checked- equal and bilateral and Positive ETCO2 Secured at: 24 cm Tube secured with: ETT holder Dental Injury: Teeth and Oropharynx as per pre-operative assessment         CRITICAL CARE Performed by: Canary Brim Kiana Hollar Total critical care time: 55 minutes Critical care time was exclusive of separately billable procedures and treating other patients. Critical care was necessary to treat or prevent imminent or life-threatening deterioration. Critical care was time spent personally by me on the following activities: development of treatment plan with patient and/or surrogate as well as nursing, discussions with consultants, evaluation of patient's response to treatment, examination of  patient, obtaining history from patient or surrogate, ordering and performing treatments and interventions, ordering and review of laboratory studies, ordering and review of radiographic studies, pulse oximetry and re-evaluation of patient's condition.  Medications Ordered in ED Medications  sodium chloride flush (NS) 0.9 % injection 3 mL (has no administration in time range)  propofol (DIPRIVAN) 1000 MG/100ML infusion (0 mcg/kg/min  111.9 kg (Order-Specific) Intravenous Stopped 03/04/24 1408)  propofol (DIPRIVAN) 1000 MG/100ML infusion (  Not Given 03/04/24 1442)  levETIRAcetam (KEPPRA) IVPB 500 mg/100 mL premix (has no administration in time range)  clevidipine (CLEVIPREX) infusion 0.5 mg/mL (8 mg/hr Intravenous Infusion Verify 03/04/24 1439)   stroke: early stages of recovery book (has no administration in time range)  acetaminophen (TYLENOL) tablet 650 mg (has no administration in time range)    Or  acetaminophen (TYLENOL) 160 MG/5ML solution 650 mg (has no administration in time range)    Or  acetaminophen (TYLENOL) suppository 650 mg (has no administration in time range)  senna-docusate (Senokot-S) tablet 1 tablet (has no administration in time range)  pantoprazole (PROTONIX) injection 40 mg (has no administration in time range)  0.9 %  sodium chloride infusion (Manually program via Guardrails IV Fluids) (has no administration in time range)  ceFAZolin (ANCEF) IVPB 2g/100 mL premix (has no administration in time range)  atropine 1 MG/10ML injection 1 mg (has no administration in time range)  midazolam (VERSED) 2 MG/2ML injection (  Given 03/04/24 1343)  levETIRAcetam (KEPPRA) IVPB 1500 mg/ 100 mL premix (3,000 mg Intravenous New Bag/Given 03/04/24 1419)  iohexol (OMNIPAQUE) 350 MG/ML injection 75 mL (75 mLs Intravenous Contrast Given 03/04/24 1441)  midazolam (VERSED) injection 5 mg (5 mg Intravenous Given 03/04/24 1443)    ED Course/ Medical Decision Making/ A&P                                  Medical Decision Making Amount and/or Complexity of Data Reviewed Labs: ordered. Radiology: ordered.  Risk Prescription drug management. Decision regarding hospitalization.    KENNA KIRN is a 58 y.o. male with a past medical history significant for hypertension, hyperlipidemia, GERD, previous stroke, sleep apnea, and diabetes who presents as a code stroke.  According to EMS, at 12:30 PM, patient was last normal.  Patient reportedly has been having some headache for an unknown period of time and was feeling ill this morning but then collapsed at work.  This was witnessed.  He did not reportedly fall and hit his head.  Patient brought in for evaluation.  En  route, patient is bradycardic, hypertensive, and has some posturing.  He has anisocoria with his left pupil larger than his right.  He is not answering any questions.  Patient arrived and I am concerned about protecting his airway.  GCS was 3 although occasionally he would respond with some posturing to painful stimuli.  Decision made to intubate for airway protection.  Patient was intubated without difficulty.  Patient taken to CT scanner for further evaluation.  Of Shepard, lungs were clear, chest was nontender and abdomen did not appear tender.  Patient was not responding to pain however.  No evidence of acute trauma initially.  Bedside x-ray was reviewed and intubation tube appears to be in place.  He will go to CT scanner.  CT scan shows evidence of large intracranial hemorrhage.  Neurosurgery was called by neurology team and anticipate admission for further management.  He was started on Cleviprex and was on sedation medications as well for intubation.  Anticipate admission for further management.  Patient will be admitted for further management.  Critical care will also help manage.  Neurosurgery was called by the neurology team.         Final Clinical Impression(s) / ED Diagnoses Final diagnoses:  Intracranial  hemorrhage (HCC)    Clinical Impression: 1. Intracranial hemorrhage (HCC)     Disposition: Admit  This Shepard was prepared with assistance of Dragon voice recognition software. Occasional wrong-word or sound-a-like substitutions may have occurred due to the inherent limitations of voice recognition software.      Devina Bezold, Canary Brim, MD 03/04/24 [phone]

## 2024-03-04 NOTE — Consult Note (Signed)
 NAME:  Mike Shepard, MRN:  [id number], DOB:  1966-09-18, LOS: 0 ADMISSION DATE:  03/04/2024, CONSULTATION DATE:  03/04/24 REFERRING MD:  Otelia Limes, CHIEF COMPLAINT:  ICH   History of Present Illness:  58 yo M PMH OSA, HTN, bilat CAS s/p endarterectomy with subsequent R ICA occlusion on DAPT who presented to ED 3/18 via EMS with AMS and collapse at work. LKN 1230. Reportedly c/o HA before his collapse. Was unresponsive after this event. With EMS he had flexor posturing and was given 5mg  versed in case this was sz. IO was placed. In ED, marginal airway protection with sonorous respirations and was subsequently intubated.    CTH with large L parietal lobe ICH extending to lateral 3rd and 4th ventricles, + mass effect and L to R midline shift   ICH score 3   Pertinent  Medical History  CAS s/p R CEA HTN GERD Chronic pain HLD OSA   Significant Hospital Events: Including procedures, antibiotic start and stop dates in addition to other pertinent events   3/18 large ICH w intraventricular extension. Intubated. Admit to ICU. EVD w NSGY   Interim History / Subjective:   On cleviprex   Received paralytic >1hr prior to my exam   Objective   Blood pressure (!) 115/54, pulse (!) 48, resp. rate 16, height 6' (1.829 m), SpO2 96%.    Vent Mode: PRVC FiO2 (%):  [100 %] 100 % Set Rate:  [16 bmp] 16 bmp Vt Set:  [580 mL] 580 mL PEEP:  [5 cmH20] 5 cmH20   Intake/Output Summary (Last 24 hours) at 03/04/2024 1634 Last data filed at 03/04/2024 1443 Gross per 24 hour  Intake 209.73 ml  Output --  Net 209.73 ml   There were no vitals filed for this visit.  Examination: General: Chronically and critically ill middle aged M intubated  Neuro: Anisocoria, L pupil 7mm fixed and R pupil nearly pinpoint. No response to pain HENT: NCAT ETT secure  Lungs: Mechanically ventilated. R side low pitched wheeze  Cardiovascular: bradycardic, regular, s1s2  Abdomen: Obese soft ndnt  Extremities: RLE IO   GU: defer  Resolved Hospital Problem list     Assessment & Plan:   Large L parieto-occipital ICH with intraventricular extension and brain compression Small L SDH  -ICH extending into lateral 3rd and 4th ventricle + 4th ventricle casting -mass effect, L to R midline shift. Early uncal herniation  -ICH score 3 -- 30d mortality risk is about 72%  -more bradycardic and hypotensive in ED after I left to talk to family. Had RN immediately stop cleviprex which was at 8 & gave 1mg  atropine. Concern is that he has worsening herniation P -admit to ICU -SBP 130-150, cleviprex -2 plt -EVD in ICU  -keppra load 300mg  then 500mg  BID -3% per neuro -- bolus then gtt. Na goal 150-155. This can go through IO for now.    Acute respiratory insufficiency due to above -post intubation gas is ok P -VAP, pulm hygiene -PRN CXR  -RASS goal 0. Not on any sedation right now   CAS s/p R CEA Chronic R ICA occlusion P -hold DAPT   Bradycardia -worry that herniation is worsening P -atropine, stop cleviprex. Re-visit in ICU   GOC Code status discussion -spent a long time talking to family re code status, and encouraged them to start talking about acceptable QOL outcomes etc. -very candidly discussed 30d morality, and the plausibility that if he survives, he is likely to have some spectrum of  deficit  -they are deeply religious and are praying for a Mike and God to heal Mike Shepard / guide medical intervention. Want any and all offered interventions including resuscitation should he arrest. Have discussed DNR as well. Remains Full Code.  -cont ongoing discussions, especially re code status if he has further s/sx herniation   Best Practice (right click and "Reselect all SmartList Selections" daily)   Diet/type: NPO DVT prophylaxis SCD Pressure ulcer(s): N/A GI prophylaxis: PPI Lines: I/O RLE  Foley:  Yes, and it is still needed Code Status:  full code Last date of multidisciplinary goals of care  discussion [3/18]  Labs   CBC: Recent Labs  Lab 03/04/24 1331 03/04/24 1335 03/04/24 1506  WBC 10.4  --   --   NEUTROABS 4.9  --   --   HGB 13.6 13.9 12.9*  HCT 39.5 41.0 38.0*  MCV 84.0  --   --   PLT 304  --   --     Basic Metabolic Panel: Recent Labs  Lab 03/04/24 1331 03/04/24 1335 03/04/24 1506  NA 133* 135 134*  K 3.7 3.8 3.9  CL 103 103  --   CO2 23  --   --   GLUCOSE 171* 172*  --   BUN 15 16  --   CREATININE 1.23 1.30*  --   CALCIUM 8.9  --   --    GFR: CrCl cannot be calculated (Unknown ideal weight.). Recent Labs  Lab 03/04/24 1331  WBC 10.4    Liver Function Tests: Recent Labs  Lab 03/04/24 1331  AST 23  ALT 19  ALKPHOS 100  BILITOT 1.0  PROT 7.4  ALBUMIN 4.0   No results for input(s): "LIPASE", "AMYLASE" in the last 168 hours. No results for input(s): "AMMONIA" in the last 168 hours.  ABG    Component Value Date/Time   PHART 7.406 03/04/2024 1506   PCO2ART 36.5 03/04/2024 1506   PO2ART 285 (H) 03/04/2024 1506   HCO3 23.6 03/04/2024 1506   TCO2 25 03/04/2024 1506   ACIDBASEDEF 2.0 03/04/2024 1506   O2SAT 100 03/04/2024 1506     Coagulation Profile: Recent Labs  Lab 03/04/24 1331  INR 1.0    Cardiac Enzymes: No results for input(s): "CKTOTAL", "CKMB", "CKMBINDEX", "TROPONINI" in the last 168 hours.  HbA1C: Hgb A1c MFr Bld  Date/Time Value Ref Range Status  12/21/2023 09:37 AM 6.5 4.6 - 6.5 % Final    Comment:    Glycemic Control Guidelines for People with Diabetes:Non Diabetic:  <6%Goal of Therapy: <7%Additional Action Suggested:  >8%   06/15/2023 01:41 PM 6.4 4.6 - 6.5 % Final    Comment:    Glycemic Control Guidelines for People with Diabetes:Non Diabetic:  <6%Goal of Therapy: <7%Additional Action Suggested:  >8%     CBG: Recent Labs  Lab 03/04/24 1330  GLUCAP 170*    Review of Systems:   Unable to obtain   Past Medical History:  He,  has a past medical history of Aortic atherosclerosis (HCC), Carotid  artery stenosis, Chronic back pain, GERD (gastroesophageal reflux disease), H/O: gout, Hyperlipidemia, Hypertension, PPD positive, Pre-diabetes, Primary osteoarthritis of right hip, Sleep apnea, Stroke (HCC), SVT (supraventricular tachycardia) (HCC), and Wears dentures.   Surgical History:   Past Surgical History:  Procedure Laterality Date   BACK SURGERY     2007-2013 (low back)   ENDARTERECTOMY Right 06/02/2019   Procedure: ENDARTERECTOMY CAROTID RIGHT;  Surgeon: Cephus Shelling, MD;  Location: [facility] OR;  Service: Vascular;  Laterality: Right;   INTRAOPERATIVE ARTERIOGRAM  06/02/2019   Procedure: Right carotid and cerebral angiogram;  Surgeon: Cephus Shelling, MD;  Location: [facility] OR;  Service: Vascular;;   MULTIPLE TOOTH EXTRACTIONS     NECK SURGERY     2010   OPERATIVE ULTRASOUND Right 06/02/2019   Procedure: Operative Ultrasound;  Surgeon: Cephus Shelling, MD;  Location: [facility] OR;  Service: Vascular;  Laterality: Right;   TOTAL HIP ARTHROPLASTY Right 02/22/2023   Procedure: TOTAL HIP ARTHROPLASTY ANTERIOR APPROACH;  Surgeon: Samson Frederic, MD;  Location: [facility] ORS;  Service: Orthopedics;  Laterality: Right;  150     Social History:   reports that he quit smoking about 14 years ago. His smoking use included cigarettes. His smokeless tobacco use includes snuff. He reports that he does not currently use drugs after having used the following drugs: Marijuana. He reports that he does not drink alcohol.   Family History:  His family history includes Colon cancer in his paternal uncle; Diabetes in his brother and sister; Diabetes (age of onset: 78) in his father; Hypertension in his brother, mother, and sister; Prostate cancer in his paternal grandfather. There is no history of Anesthesia problems, CAD, Stroke, Stomach cancer, Esophageal cancer, Pancreatic cancer, or Adrenal disorder.   Allergies No Known Allergies   Home Medications  Prior to Admission medications   Medication Sig  Start Date End Date Taking? Authorizing Provider  atorvastatin (LIPITOR) 80 MG tablet Take 1 tablet (80 mg total) by mouth daily. 06/15/23   Wanda Plump, MD  buprenorphine (SUBUTEX) 2 MG SUBL SL tablet Place 2 mg under the tongue daily.    [provider]  carvedilol (COREG) 25 MG tablet TAKE 1 TABLET BY MOUTH TWICE DAILY WITH A MEAL 12/17/23   Turner, Cornelious Bryant, MD  clopidogrel (PLAVIX) 75 MG tablet Take 1 tablet (75 mg total) by mouth daily. 08/21/23   Wanda Plump, MD  diltiazem (CARDIZEM CD) 180 MG 24 hr capsule Take 1 capsule (180 mg total) by mouth daily. 05/24/23   Quintella Reichert, MD  ezetimibe (ZETIA) 10 MG tablet Take 1 tablet (10 mg total) by mouth daily. 06/15/23   Wanda Plump, MD  fluticasone [facility]) 50 MCG/ACT nasal spray Place 1 spray into both nostrils daily as needed for allergies or rhinitis.    [provider]  hydrALAZINE (APRESOLINE) 50 MG tablet Take 1.5 tablets (75 mg total) by mouth 3 (three) times daily. 09/19/23   Quintella Reichert, MD  Multiple Vitamin (MULTIVITAMIN WITH MINERALS) TABS Take 1 tablet by mouth daily.    [provider]  spironolactone (ALDACTONE) 25 MG tablet Take 1 tablet (25 mg total) by mouth daily. 05/02/23   Quintella Reichert, MD  valsartan (DIOVAN) 320 MG tablet Take 1 tablet (320 mg total) by mouth daily. 11/28/23   Quintella Reichert, MD     Critical care time: 88 minutes     CRITICAL CARE Performed by: Lanier Clam   Total critical care time: 88 minutes  Critical care time was exclusive of separately billable procedures and treating other patients.  Critical care was necessary to treat or prevent imminent or life-threatening deterioration.  Critical care was time spent personally by me on the following activities: development of treatment plan with patient and/or surrogate as well as nursing, discussions with consultants, evaluation of patient's response to treatment, examination of patient, obtaining history from patient or  surrogate, ordering and performing treatments and interventions, ordering and review  of laboratory studies, ordering and review of radiographic studies, pulse oximetry and re-evaluation of patient's condition.  Tessie Fass MSN, AGACNP-BC [facility] Pulmonary/Critical Care Medicine Amion for pager  03/04/2024, 4:34 PM

## 2024-03-05 ENCOUNTER — Inpatient Hospital Stay (HOSPITAL_COMMUNITY)

## 2024-03-05 DIAGNOSIS — J9601 Acute respiratory failure with hypoxia: Secondary | ICD-10-CM

## 2024-03-05 DIAGNOSIS — I629 Nontraumatic intracranial hemorrhage, unspecified: Secondary | ICD-10-CM | POA: Diagnosis not present

## 2024-03-05 DIAGNOSIS — R578 Other shock: Secondary | ICD-10-CM | POA: Diagnosis not present

## 2024-03-05 DIAGNOSIS — G935 Compression of brain: Secondary | ICD-10-CM | POA: Diagnosis not present

## 2024-03-05 LAB — PREPARE PLATELET PHERESIS
Unit division: 0
Unit division: 0
Unit division: 0

## 2024-03-05 LAB — BASIC METABOLIC PANEL
Anion gap: 11 (ref 5–15)
BUN: 11 mg/dL (ref 6–20)
CO2: 20 mmol/L — ABNORMAL LOW (ref 22–32)
Calcium: 9.1 mg/dL (ref 8.9–10.3)
Chloride: 113 mmol/L — ABNORMAL HIGH (ref 98–111)
Creatinine, Ser: 1.01 mg/dL (ref 0.61–1.24)
GFR, Estimated: >60 mL/min (ref >60)
Glucose, Bld: 143 mg/dL — ABNORMAL HIGH (ref 70–99)
Potassium: 4 mmol/L (ref 3.5–5.1)
Sodium: 144 mmol/L (ref 135–145)

## 2024-03-05 LAB — BPAM PLATELET PHERESIS
Blood Product Expiration Date: 202503202359
Blood Product Expiration Date: 202503202359
Blood Product Expiration Date: 202503202359
ISSUE DATE / TIME: 202503181649
ISSUE DATE / TIME: 202503181954
Unit Type and Rh: 5100
Unit Type and Rh: 5100
Unit Type and Rh: 6200

## 2024-03-05 LAB — OSMOLALITY, URINE
Osmolality, Ur: 86 mosm/kg — ABNORMAL LOW (ref 300–900)
Osmolality, Ur: 723 mosm/kg (ref 300–900)

## 2024-03-05 LAB — TRIGLYCERIDES: Triglycerides: 69 mg/dL (ref <150)

## 2024-03-05 MED ORDER — GLYCOPYRROLATE 1 MG PO TABS: 1.0000 mg | ORAL_TABLET | ORAL | Status: DC | PRN

## 2024-03-05 MED ORDER — MIDAZOLAM HCL 2 MG/2ML IJ SOLN: 2.0000 mg | INTRAMUSCULAR | Status: DC | PRN

## 2024-03-05 MED ORDER — ACETAMINOPHEN 650 MG RE SUPP: 650.0000 mg | Freq: Four times a day (QID) | RECTAL | Status: DC | PRN

## 2024-03-05 MED ORDER — ACETAMINOPHEN 325 MG PO TABS: 650.0000 mg | ORAL_TABLET | Freq: Four times a day (QID) | ORAL | Status: DC | PRN

## 2024-03-05 MED ORDER — POLYVINYL ALCOHOL 1.4 % OP SOLN: 1.0000 [drp] | Freq: Four times a day (QID) | OPHTHALMIC | Status: DC | PRN

## 2024-03-05 MED ORDER — MORPHINE BOLUS VIA INFUSION: 5.0000 mg | INTRAVENOUS | Status: DC | PRN

## 2024-03-05 MED ORDER — MORPHINE 100MG IN NS 100ML (1MG/ML) PREMIX INFUSION
0.0000 mg/h | INTRAVENOUS | Status: DC
  Administered 2024-03-05: 5 mg/h via INTRAVENOUS
  Filled 2024-03-05: qty 100

## 2024-03-05 MED ORDER — GLYCOPYRROLATE 0.2 MG/ML IJ SOLN: 0.2000 mg | INTRAMUSCULAR | Status: DC | PRN

## 2024-03-11 ENCOUNTER — Other Ambulatory Visit: Payer: Self-pay | Admitting: Internal Medicine

## 2024-03-11 ENCOUNTER — Other Ambulatory Visit: Payer: Self-pay | Admitting: Cardiology

## 2024-03-18 NOTE — Progress Notes (Addendum)
 Pt's EVD no longer draining.  No pulsation observed.  Neuro Sx paged.  Alfonse Flavors, PA notified.  No new orders.     Pt no longer has any brainstem reflexes.  Pt no longer moves to pain.  Dr. Wilford Corner with Neurology notified.  No new orders.

## 2024-03-18 NOTE — Progress Notes (Signed)
 Attempted Echocardiogram, per RN exam is cancelled because patient is in comfort care.

## 2024-03-18 NOTE — TOC Initial Note (Signed)
 Transition of Care [facility]) - Initial/Assessment Note    Patient Details  Name: Mike Shepard MRN: [id number] Date of Birth: Sep 15, 1966  Transition of Care [facility]) CM/SW Contact:    Lamonte Sakai, Student-Social Work Phone Number: 03/01/2024, 3:15 PM  Clinical Narrative:                 Pt admitted from home with spouse- undergoing stroke work up. Pt currently intubated, please place consult as TOC needs arise.         Patient Goals and CMS Choice            Expected Discharge Plan and Services       Living arrangements for the past 2 months: Single Family Home                                      Prior Living Arrangements/Services Living arrangements for the past 2 months: Single Family Home Lives with:: Spouse                   Activities of Daily Living      Permission Sought/Granted                  Emotional Assessment   Attitude/Demeanor/Rapport: Intubated (Following Commands or Not Following Commands) Affect (typically observed): Unable to Assess Orientation: :  (Intubated) Alcohol / Substance Use: Tobacco Use    Admission diagnosis:  Intracranial hemorrhage (HCC) [I62.9] ICH (intracerebral hemorrhage) (HCC) [I61.9] Patient Active Problem List   Diagnosis Date Noted   Intracranial hemorrhage (HCC) 03/04/2024   Osteoarthritis of right hip 02/22/2023   OSA (obstructive sleep apnea) 03/17/2022   Aortic atherosclerosis (HCC)    Carotid artery stenosis    Asymptomatic stenosis of right carotid artery without infarction 06/02/2019   Carotid stenosis 05/20/2019   H/O: CVA  per MRI 05/14/2018   DJD (degenerative joint disease) 04/21/2016   PCP NOTES >>>>> 11/06/2015   Diabetes mellitus without complication (HCC) 12/28/2013   Abnormal TSH 12/28/2013   Annual physical exam 07/19/2012   Back pain 07/19/2012   GOUT 11/15/2010   Hyperlipidemia 08/29/2007   Essential hypertension 06/27/2007   GERD 06/27/2007   PCP:  Wanda Plump,  MD Pharmacy:   [facility] [address] [place] - [address] [place] [address] Phone: [phone] Fax: [phone]  [facility] - [place], [place] - [address] [place] [address] Phone: [phone] Fax: [phone]     Social Drivers of Health (SDOH) Social History: SDOH Screenings   Food Insecurity: No Food Insecurity (12/21/2023)  Housing: Low Risk  (12/21/2023)  Transportation Needs: No Transportation Needs (12/21/2023)  Utilities: Not At Risk (02/22/2023)  Depression (PHQ2-9): Low Risk  (12/21/2023)  Financial Resource Strain: Low Risk  (12/21/2023)  Physical Activity: Insufficiently Active (12/21/2023)  Social Connections: Socially Integrated (12/21/2023)  Stress: No Stress Concern Present (12/21/2023)  Tobacco Use: High Risk (01/01/2024)   SDOH Interventions:     Readmission Risk Interventions     No data to display

## 2024-03-18 NOTE — Death Summary Note (Signed)
  Patient ID: DAINEL ARCIDIACONO MRN: [id number] DOB/AGE: [birth year] 58 y.o.  Admit date: 09-Mar-2024 Death date: 03-11-2024  Admission Diagnoses: Intracranial hemorrhage  Cause of Death: Intracranial hemorrhage, cerebral edema, mass effect, brain herniation  Pertinent Medical Diagnosis: Principal Problem:   Intracranial hemorrhage [facility])   Hospital Course:  03-09-24     CODE STROKE due to collapse wt work             [facility] shows large L parietal ICH with intraventricular extension into, lateral, 3rd and 4th ventricles, mass effectr and midline shift             EVD placed by NS in ED, deemed no surgical interventions             Repeat CTH with stable bleed but progressive mass effect in posterior fossa and mild transtentorial herniation present.  3/19: Overnight, patient's exam continued to decline, poor prognosis. Patient is now unresponsive with no reflexes, requiring pressor support.  Labs and images reviewed, family updated thoroughly and all questions answered. Extensive GOC discussion with family and CCM at bedside.  On exam, patient has no cough, gag or corneal reflexes, negative doll's eye, no response to noxious stimuli.  Patient was transition to comfort care per family's wishes around 72, TOD 2001   Signed:  Lynnae January, DNP, ACAGNP-BC 03/11/24, 6:12 PM

## 2024-03-18 NOTE — Plan of Care (Signed)
 On-call overnight neurology note  Notified of patient's passing-time of death 2001 hrs.  Will inform the primary team for death summary and death significant to be done in the morning   -- Milon Dikes, MD Neurologist [facility]

## 2024-03-18 NOTE — Progress Notes (Signed)
 NAME:  Mike Shepard, MRN:  [id number], DOB:  1966/07/02, LOS: 1 ADMISSION DATE:  03/04/2024, CONSULTATION DATE:  03/04/24 REFERRING MD:  Otelia Limes, CHIEF COMPLAINT:  ICH   History of Present Illness:  58 yo M PMH OSA, HTN, bilat CAS s/p endarterectomy with subsequent R ICA occlusion on DAPT who presented to ED 3/18 via EMS with AMS and collapse at work. LKN 1230. Reportedly c/o HA before his collapse. Was unresponsive after this event. With EMS he had flexor posturing and was given 5mg  versed in case this was sz. IO was placed. In ED, marginal airway protection with sonorous respirations and was subsequently intubated.    CTH with large L parietal lobe ICH extending to lateral 3rd and 4th ventricles, + mass effect and L to R midline shift   ICH score 3   Pertinent  Medical History  CAS s/p R CEA HTN GERD Chronic pain HLD OSA   Significant Hospital Events: Including procedures, antibiotic start and stop dates in addition to other pertinent events   3/18 large ICH w intraventricular extension. Intubated. Admit to ICU. EVD w NSGY  3/19 DNR. EVD clotted off. Herniating on repeat CT. Losing brainstem reflexes. On pressors.   Interim History / Subjective:  Overnight EVD clotted off Went for repeat scan which had incr mass effect and transtentorial herniation  Made DNR   Objective   Blood pressure 95/64, pulse 76, temperature 98.1 F (36.7 C), resp. rate 18, height 6' (1.829 m), weight 104.2 kg, SpO2 96%.    Vent Mode: PRVC FiO2 (%):  [40 %-100 %] 40 % Set Rate:  [16 bmp-20 bmp] 20 bmp Vt Set:  [580 mL] 580 mL PEEP:  [5 cmH20] 5 cmH20 Plateau Pressure:  [17 cmH20-18 cmH20] 17 cmH20   Intake/Output Summary (Last 24 hours) at 03/01/2024 1052 Last data filed at 02/24/2024 0900 Gross per 24 hour  Intake 5676.44 ml  Output 4049 ml  Net 1627.44 ml   Filed Weights   02/21/2024 0615  Weight: 104.2 kg    Examination: General: Chronically and critically ill adult M intubated not  sedated  Neuro: Fixed pupils no response to pain. No cough gag. Triggers vent.  HENT: NCAT ETT secure  Lungs: Mechanically ventilated  Cardiovascular: rr s1s2  Abdomen: obese soft  Extremities: no acute joint deformity. RLE IO  GU: foley  Resolved Hospital Problem list     Assessment & Plan:   Large L parieto-occipital ICH with intraventricular extension and brain compression Small L SDH    Large L parieto-occipital ICH w intraventricular extension and brain compression -- worsening Small L SDH Neurogenic shock  Acute respiratory insufficiency due to above  Bilateral CAS s/p R CEA Chronic R ICA occlusion  GOC discussion DNR  Encounter for palliative care  -ICH score 3, 72% 30d mortality. ICH extending into lateral 3rd and 4th ventricle + 4th ventricle casting, mass effect, midline shift, early uncal herniation 3/18.  -on repeat CT H worse mass effect and transtentorial herniation  -bradycardic/hypotensive + herniation -EVD placed 3/18, has clotted off and is non-functional P -Declining neuro & hemodynamic status, now on pressors with minimal brainstem reflexes -after discussing with family, decision has been reached to transition to comfort care, with extubation later this afternoon/early evening when out of town sister arrives (approx 1630/1700) -we will continue hypertonic, pressors, and vent as temporizing measures to try to facilitate sister's arrival. Family understands pt may die in interim -We are not checking further labs or imaging, and we  are not continuing medications (other than NE HTS) not aimed at comfort  -morphine, versed glycco are available when needed -liberalize visitation -DNR   Best Practice (right click and "Reselect all SmartList Selections" daily)   Diet/type: NPO DVT prophylaxis SCD Pressure ulcer(s): N/A GI prophylaxis: PPI Lines: I/O RLE  Foley:  Yes, and it is still needed Code Status:  full code Last date of multidisciplinary goals of  care discussion [3/18]  Labs   CBC: Recent Labs  Lab 03/04/24 1331 03/04/24 1335 03/04/24 1506  WBC 10.4  --   --   NEUTROABS 4.9  --   --   HGB 13.6 13.9 12.9*  HCT 39.5 41.0 38.0*  MCV 84.0  --   --   PLT 304  --   --     Basic Metabolic Panel: Recent Labs  Lab 03/04/24 1331 03/04/24 1335 03/04/24 1506 03/04/24 1820 03/04/24 2311 02/16/2024 0515  NA 133* 135 134* 134* 142 144  K 3.7 3.8 3.9  --   --  4.0  CL 103 103  --   --   --  113*  CO2 23  --   --   --   --  20*  GLUCOSE 171* 172*  --   --   --  143*  BUN 15 16  --   --   --  11  CREATININE 1.23 1.30*  --   --   --  1.01  CALCIUM 8.9  --   --   --   --  9.1   GFR: Estimated Creatinine Clearance: 99.5 mL/min (by C-G formula based on SCr of 1.01 mg/dL). Recent Labs  Lab 03/04/24 1331  WBC 10.4    Liver Function Tests: Recent Labs  Lab 03/04/24 1331  AST 23  ALT 19  ALKPHOS 100  BILITOT 1.0  PROT 7.4  ALBUMIN 4.0   No results for input(s): "LIPASE", "AMYLASE" in the last 168 hours. No results for input(s): "AMMONIA" in the last 168 hours.  ABG    Component Value Date/Time   PHART 7.406 03/04/2024 1506   PCO2ART 36.5 03/04/2024 1506   PO2ART 285 (H) 03/04/2024 1506   HCO3 23.6 03/04/2024 1506   TCO2 25 03/04/2024 1506   ACIDBASEDEF 2.0 03/04/2024 1506   O2SAT 100 03/04/2024 1506     Coagulation Profile: Recent Labs  Lab 03/04/24 1331  INR 1.0    Cardiac Enzymes: No results for input(s): "CKTOTAL", "CKMB", "CKMBINDEX", "TROPONINI" in the last 168 hours.  HbA1C: Hgb A1c MFr Bld  Date/Time Value Ref Range Status  12/21/2023 09:37 AM 6.5 4.6 - 6.5 % Final    Comment:    Glycemic Control Guidelines for People with Diabetes:Non Diabetic:  <6%Goal of Therapy: <7%Additional Action Suggested:  >8%   06/15/2023 01:41 PM 6.4 4.6 - 6.5 % Final    Comment:    Glycemic Control Guidelines for People with Diabetes:Non Diabetic:  <6%Goal of Therapy: <7%Additional Action Suggested:  >8%      CBG: Recent Labs  Lab 03/04/24 1330  GLUCAP 170*    CRITICAL CARE Performed by: Lanier Clam   Total critical care time: 87 minutes  Critical care time was exclusive of separately billable procedures and treating other patients.  Critical care was necessary to treat or prevent imminent or life-threatening deterioration.  Critical care was time spent personally by me on the following activities: development of treatment plan with patient and/or surrogate as well as nursing, discussions with consultants, evaluation of  patient's response to treatment, examination of patient, obtaining history from patient or surrogate, ordering and performing treatments and interventions, ordering and review of laboratory studies, ordering and review of radiographic studies, pulse oximetry and re-evaluation of patient's condition.  Tessie Fass MSN, AGACNP-BC [facility] Pulmonary/Critical Care Medicine Amion for pager  02/26/2024, 10:52 AM

## 2024-03-18 NOTE — Progress Notes (Signed)
 PT Cancellation Note  Patient Details Name: Mike Shepard MRN: [id number] DOB: 1966/05/17   Cancelled Treatment:    Reason Eval/Treat Not Completed: Patient not medically ready  Lillia Pauls, PT, DPT Acute Rehabilitation Services Office [phone]    [name] 03/10/2024, 8:02 AM

## 2024-03-18 NOTE — Plan of Care (Signed)
  Problem: Health Behavior/Discharge Planning: Goal: Goals will be collaboratively established with patient/family Outcome: Progressing   Problem: Nutrition: Goal: Risk of aspiration will decrease Outcome: Progressing

## 2024-03-18 NOTE — Progress Notes (Addendum)
 Time of death 2001.  Pronounced by Posey Boyer RN and Linward Foster RN.

## 2024-03-18 NOTE — Progress Notes (Signed)
 SLP Cancellation Note  Patient Details Name: Mike Shepard MRN: [id number] DOB: Feb 26, 1966   Cancelled treatment:       Reason Eval/Treat Not Completed: Patient not medically ready  [name] B. Murvin Natal, [facility], CCC-SLP Speech Language Pathologist Office: [phone]  [name] 02/25/2024, 9:05 AM

## 2024-03-18 NOTE — Progress Notes (Signed)
 OT Cancellation Note  Patient Details Name: Mike Shepard MRN: [id number] DOB: [birth year]   Cancelled Treatment:    Reason Eval/Treat Not Completed: Patient not medically ready (Note decline in status. Will follow up later date as appropriate.)  [facility] 03/10/2024, 6:51 AM Luisa Dago, OT/L   Acute OT Clinical Specialist Acute Rehabilitation Services Pager [phone] Office [phone]

## 2024-03-18 NOTE — Progress Notes (Signed)
    Providing Compassionate, Quality Care - Together   NEUROSURGERY PROGRESS NOTE     S: Hypotensive o/n, now on pressors. EVD slow to no output.    O: EXAM:  BP (!) 94/59   Pulse 73   Temp 97.9 F (36.6 C)   Resp 20   Ht 6' (1.829 m)   Wt 104.2 kg   SpO2 97%   BMI 31.16 kg/m     Intubated Pupils dilated and nonreactive b/l No corneals No cough/gag Neg dolls eye No response to stimuli extremities   ASSESSMENT:  58 y.o. with large ICH w/ intraventricular extension and MLS and uncal herniation    PLAN: -Poor prognosis -Family at bedside with CCM, indicating plans to withdraw care today.  -Call w/ questions/concerns.   Mike Shepard, [facility]

## 2024-03-18 NOTE — Procedures (Signed)
 Patient extubated to room air per MD order/. Patient has been transitioned to comfort care only.

## 2024-03-18 NOTE — Progress Notes (Addendum)
 STROKE TEAM PROGRESS NOTE    SIGNIFICANT HOSPITAL EVENTS 3/18  CODE STROKE due to collapse wt work  [facility] shows large L parietal ICH with intraventricular extension into, lateral, 3rd and 4th ventricles, mass effectr and midline shift  EVD placed by NS in ED, deemed no surgical interventions  Repeat CTH with stable bleed but progressive mass effect in posterior fossa and mild transtentorial herniation present.    INTERIM HISTORY/SUBJECTIVE  Overnight, patient's exam continued to decline, poor prognosis. Patient is now unresponsive with no reflexes, requiring pressor support.  Labs and images reviewed, family updated thoroughly and all questions answered. Extensive GOC discussion with family and CCM at bedside.   On exam, patient has no cough, gag or corneal reflexes, negative doll's eye, no response to noxious stimuli.  OBJECTIVE  CBC    Component Value Date/Time   WBC 10.4 03/04/2024 1331   RBC 4.70 03/04/2024 1331   HGB 12.9 (L) 03/04/2024 1506   HCT 38.0 (L) 03/04/2024 1506   PLT 304 03/04/2024 1331   MCV 84.0 03/04/2024 1331   MCH 28.9 03/04/2024 1331   MCHC 34.4 03/04/2024 1331   RDW 12.4 03/04/2024 1331   LYMPHSABS 4.2 (H) 03/04/2024 1331   MONOABS 0.9 03/04/2024 1331   EOSABS 0.3 03/04/2024 1331   BASOSABS 0.1 03/04/2024 1331    BMET    Component Value Date/Time   NA 144 03/07/2024 0515   NA 138 05/09/2023 0748   K 4.0 03/13/2024 0515   CL 113 (H) 03/11/2024 0515   CO2 20 (L) 03/13/2024 0515   GLUCOSE 143 (H) 03/02/2024 0515   BUN 11 03/08/2024 0515   BUN 9 05/09/2023 0748   CREATININE 1.01 03/06/2024 0515   CREATININE 0.91 12/15/2022 1414   CALCIUM 9.1 03/10/2024 0515   EGFR 84 05/09/2023 0748   GFRNONAA >60 02/25/2024 0515    IMAGING past 24 hours CT HEAD POST STROKE FOLLOWUP/TIMED/STAT READ Result Date: 03/04/2024 CLINICAL DATA:  Follow-up examination for stroke. EXAM: CT HEAD WITHOUT CONTRAST TECHNIQUE: Contiguous axial images were obtained from the  base of the skull through the vertex without intravenous contrast. RADIATION DOSE REDUCTION: This exam was performed according to the departmental dose-optimization program which includes automated exposure control, adjustment of the mA and/or kV according to patient size and/or use of iterative reconstruction technique. COMPARISON:  CTs from earlier the same day FINDINGS: Brain: Acute intraparenchymal hematoma centered at the left parieto-occipital region again seen, not significantly changed in size or morphology as compared to previous. Intraventricular extension with blood throughout the ventricular system, similar. Extra-axial extension with a holo hemispheric subdural hematoma overlying the left cerebral convexity. Subdural hematoma measures up to 4 mm in thickness. Mass effect on the subjacent left cerebral hemisphere with persistent 11 mm of left-to-right shift. Increased mass effect within the posterior fossa as compared to prior with some transtentorial herniation of the cerebellar tonsils below the foramen magnum, progressed (series 6, image 32). Interval placement of a right frontal approach ventriculostomy with tip at the septum pellucidum. Ventricular size is decreased from prior with no residual or recurrent hydrocephalus. No other new intracranial hemorrhage. No other acute large vessel territory infarct. No visible mass lesion. Vascular: No visible abnormal hyperdense vessel. Skull: Soft tissue swelling at the right scalp related to ventriculostomy placement. Calvarium otherwise intact. Sinuses/Orbits: Globes and orbital soft tissues within normal limits paranasal sinuses remain largely clear. No significant mastoid effusion. Other: Endotracheal and enteric tubes in place. IMPRESSION: 1. Interval placement of a right frontal approach  ventriculostomy with tip at the septum pellucidum. Ventricular size is decreased from prior without residual hydrocephalus. 2. No significant interval change in size  and morphology of acute intraparenchymal hematoma centered at the left parieto-occipital region. Intraventricular extension with blood throughout the ventricular system, similar. 3. Extra-axial extension with holo hemispheric subdural hematoma overlying the left cerebral convexity, measuring up to 4 mm in thickness. 4. Persistent 11 mm of left-to-right shift. Increased mass effect within the posterior fossa as compared to prior with some transtentorial herniation of the cerebellar tonsils below the foramen magnum. These results were communicated to Dr. Wilford Corner at 10:21 pm on 03/04/2024 by text page via the [facility] messaging system. Electronically Signed   By: Rise Mu M.D.   On: 03/04/2024 22:22   CT C-SPINE NO CHARGE Result Date: 03/04/2024 CLINICAL DATA:  Intracranial hemorrhage.  Fell. EXAM: CT CERVICAL SPINE WITHOUT CONTRAST TECHNIQUE: Multidetector CT imaging of the cervical spine was performed without intravenous contrast. Multiplanar CT image reconstructions were also generated. RADIATION DOSE REDUCTION: This exam was performed according to the departmental dose-optimization program which includes automated exposure control, adjustment of the mA and/or kV according to patient size and/or use of iterative reconstruction technique. COMPARISON:  Remote CT scan from 2009 FINDINGS: Alignment: Normal Skull base and vertebrae: No acute bony findings. No fracture or bone lesion. Interbody fusion device noted at C5-6. No complicating features. Soft tissues and spinal canal: No prevertebral fluid or swelling. No visible canal hematoma. Generous spinal canal. Disc levels:  No disc protrusions, spinal or foraminal stenosis. Upper chest: The lung apices are clear. The endotracheal tube is in good position. The NG tube is in the esophagus. Other: No neck mass or adenopathy.  The thyroid gland is normal. IMPRESSION: 1. No acute bony findings involving the cervical spine. 2. Interbody fusion device at C5-6. No  complicating features. 3. No disc protrusions, spinal or foraminal stenosis. Electronically Signed   By: Rudie Meyer M.D.   On: 03/04/2024 16:23   DG Abdomen 1 View Result Date: 03/04/2024 CLINICAL DATA:  NG tube placement. EXAM: ABDOMEN - 1 VIEW COMPARISON:  None FINDINGS: The NG tube is coursing down the esophagus and into the stomach. The tip is not identified but it is likely in the antropyloric region. The upper abdominal bowel gas pattern is unremarkable. IMPRESSION: NG tube tip is likely in the antropyloric region of the stomach. Electronically Signed   By: Rudie Meyer M.D.   On: 03/04/2024 16:20   DG Chest Portable 1 View Result Date: 03/04/2024 CLINICAL DATA:  Intracranial hemorrhage. EXAM: PORTABLE CHEST 1 VIEW COMPARISON:  Chest x-ray 04/08/2023 FINDINGS: The endotracheal tube is seen and good position, 4 cm above the carina. External pacer paddles are noted. The cardiac silhouette, mediastinal and hilar contours are within normal limits given the AP projection, portable technique and supine position of the patient. No infiltrates, effusions or pneumothorax. The bony thorax is intact. IMPRESSION: 1. Endotracheal tube in good position. 2. No acute cardiopulmonary findings. Electronically Signed   By: Rudie Meyer M.D.   On: 03/04/2024 16:18   CT ANGIO HEAD NECK W WO CM (CODE STROKE) Result Date: 03/04/2024 CLINICAL DATA:  Acute intracranial hemorrhage EXAM: CT ANGIOGRAPHY HEAD AND NECK WITH AND WITHOUT CONTRAST TECHNIQUE: Multidetector CT imaging of the head and neck was performed using the standard protocol during bolus administration of intravenous contrast. Multiplanar CT image reconstructions and MIPs were obtained to evaluate the vascular anatomy. Carotid stenosis measurements (when applicable) are obtained utilizing NASCET criteria,  using the distal internal carotid diameter as the denominator. RADIATION DOSE REDUCTION: This exam was performed according to the departmental  dose-optimization program which includes automated exposure control, adjustment of the mA and/or kV according to patient size and/or use of iterative reconstruction technique. CONTRAST:  75mL OMNIPAQUE IOHEXOL 350 MG/ML SOLN COMPARISON:  Head CT medially prior FINDINGS: CTA NECK FINDINGS Aortic arch: Aortic atherosclerotic calcification. Branching pattern is normal. Right carotid system: Common carotid artery widely patent to the bifurcation. Occlusion of the ICA in the bulb. Left carotid system: Common carotid artery widely patent to the bifurcation. Soft and calcified plaque at the bifurcation and proximal ICA. Minimal diameter of the proximal ICA 1 mm. Diameter of the distal ICA is reduced due to inflow reduction. The stenosis is consistent with an 80% or greater stenosis. Vertebral arteries: Both vertebral artery origins are widely patent. The left vertebral artery originates from the arch. Skeleton: Previous disc arthroplasty at C5-6. Other neck: No mass or lymphadenopathy. Enlarged heterogeneous thyroid. Upper chest: Lung apices are clear. Review of the MIP images confirms the above findings CTA HEAD FINDINGS Anterior circulation: The left internal carotid artery is patent through the skull base and siphon regions. There is siphon atherosclerotic calcification but no stenosis greater than 30% in that segment. Right cervical ICA is occluded. No flow demonstrated and till there is some reconstituted flow in the extreme ICA terminus. Flow can be seen in both anterior and middle cerebral vessels. Posterior circulation: Both vertebral arteries are patent through the foramen magnum to the basilar artery. No basilar stenosis. Posterior circulation branch vessels are patent. Venous sinuses: Patent and normal. Anatomic variants: None other significant. Review of the MIP images confirms the above findings IMPRESSION: 1. Occlusion of the right ICA in the bulb. No flow demonstrated and until there is some reconstituted  flow in the extreme ICA terminology. Flow can be seen in both anterior and middle cerebral vessels. 2. Soft and calcified plaque at the left carotid bifurcation and proximal ICA. Minimal diameter of the proximal ICA 1 mm. Diameter of the distal ICA is reduced due to inflow reduction. The stenosis is consistent with an 80% or greater stenosis. 3. These results were communicated to Dr. Otelia Limes at 3:08 pm on 03/04/2024 by text page via the [facility] messaging system. Electronically Signed   By: Paulina Fusi M.D.   On: 03/04/2024 15:12   CT HEAD CODE STROKE WO CONTRAST Result Date: 03/04/2024 CLINICAL DATA:  Code stroke. Neuro deficit, acute, stroke suspected. Patient unresponsive. EXAM: CT HEAD WITHOUT CONTRAST TECHNIQUE: Contiguous axial images were obtained from the base of the skull through the vertex without intravenous contrast. RADIATION DOSE REDUCTION: This exam was performed according to the departmental dose-optimization program which includes automated exposure control, adjustment of the mA and/or kV according to patient size and/or use of iterative reconstruction technique. COMPARISON:  MRI 03/16/2018.  CT angiography 05/26/2019. FINDINGS: Brain: Acute intraparenchymal hematoma in the left parieto-occipital junction region measuring approximately 5 x 5 x 4 cm. Intraventricular extension into the atrium of the left lateral ventricle with intraventricular hemorrhage largely filling the left lateral ventricle, third ventricle and fourth ventricle and some blood within the right lateral ventricle. Subdural hematoma on the left along the lateral convexity with maximal thickness of 6 mm. This could be due to direct extension of the intraparenchymal hemorrhage or could relate to head trauma. Subarachnoid hemorrhage is not demonstrated within the sulci on the left. There is mass effect with left-to-right midline shift of 1.4 cm.  No ventricular trapping. Early uncal herniation on the left. Vascular: There is  atherosclerotic calcification of the major vessels at the base of the brain. Skull: No skull fracture or focal bone finding. Sinuses/Orbits: Ordinary mild seasonal mucosal thickening. Orbits negative. Other: None IMPRESSION: 1. Acute intraparenchymal hematoma in the left parieto-occipital junction region measuring approximately 5 x 5 x 4 cm. Intraventricular extension into the atrium of the left lateral ventricle with intraventricular hemorrhage largely filling the left lateral ventricle, third ventricle and fourth ventricle and some blood within the right lateral ventricle. 2. Subdural hematoma on the left along the lateral convexity with maximal thickness of 6 mm. This could be due to direct extension of the intraparenchymal hemorrhage or could relate to head trauma. 3. Mass effect with left-to-right midline shift of 1.4 cm. No ventricular trapping. Early uncal herniation on the left. 4. These results were communicated to Dr. Otelia Limes at 3:02 pm on 03/04/2024 by text page via the [facility] messaging system. Electronically Signed   By: Paulina Fusi M.D.   On: 03/04/2024 15:02    Vitals:   03/02/2024 0715 02/25/2024 0730 02/29/2024 0745 03/14/2024 0800  BP: 90/60 (!) 99/57 (!) 96/59 96/63  Pulse: 70 71 71 72  Resp: 20 20 20 20   Temp: (!) 97.2 F (36.2 C) (!) 97.3 F (36.3 C) (!) 97.5 F (36.4 C) 97.7 F (36.5 C)  TempSrc:    Esophageal  SpO2: 97% 97% 96% 97%  Weight:      Height:         PHYSICAL EXAM General: Acutely, gravely ill CV: Regular rate and rhythm on monitor Respiratory: Mechanically ventilated, on full support  NEURO:  Patient is intubated, on no sedation.  Eyes do not open to voice or pain, does not resist forced eye opening.  Pupils are dilated and nonreactive bilaterally.  Negative corneal reflex.  Negative doll's eyes. Negative cough and gag reflex No grimace or withdrawal in any extremity to noxious stimuli.  NIHSS:  1a Level of Conscious.: 3 1b LOC Questions: 2 1c LOC Commands:  2 2 Best Gaze: 2 3 Visual: 3 4 Facial Palsy: 0 5a Motor Arm - left: 4 5b Motor Arm - Right:4  6a Motor Leg - Left: 4 6b Motor Leg - Right: 4 7 Limb Ataxia: U 8 Sensory: 2 9 Best Language: 3 10 Dysarthria: U 11 Extinct. and Inatten.: 2 TOTAL:  26   ASSESSMENT/PLAN  Mike Shepard is a 58 y.o. male with history of hypertension, hyperlipidemia, sleep apnea, chronic back pain, carotid artery stenosis status post right carotid endarterectomy with silent occlusion of right ICA after, GERD, gout, prediabetes, SVT who presented as a code stroke 3/18 after becoming unresponsive at work. CTH revealed large L parietal ICH. Intubated in ED, inability to protect airway.  NIH on Admission 30  Intraparenchymal Hemorrhage:  left parietal with ICH Score 3  Etiology:  likely hypertensive bleed in patient on anticoagulation  Small Left SDH Code Stroke CT head   Acute intraparenchymal hematoma in the left parieto-occipital junction region measuring approximately 5 x 5 x 4 cm Intraventricular extension into the atrium of the left lateral ventricle with intraventricular hemorrhage largely filling the left lateral ventricle, third ventricle and fourth ventricle and some blood within the right lateral ventricle. CTA head & neck  Occlusion of the right ICA in the bulb.  Post IR CT  Interval placement of a right frontal approach ventriculostomy with tip at the septum pellucidum. Ventricular size is decreased from prior  without residual hydrocephalus. MRI  pending 2D Echo: pending LDL 46 HgbA1c 6.5 VTE prophylaxis - SCDs clopidogrel 75 mg daily prior to admission, now on No antithrombotic due to ICH Therapy recommendations:  Pending Disposition:  pending GOC discussion with family, likely will go comfort care pending other family members to arrive.  Acute Respiratory Failure due to neurological injury Intubated 3/18 in ED CCM assistance appreciated  Hx of R carotid endarterectomy Chronic R  ICA silent occlusion Bilateral CAS Home Meds: plavix Continue telemetry monitoring Hold anticoagulation due to IPH  Brain Herniation Cerebral Edema Mass Effect NS consulted Deemed no surgical intervention d/t extensive intraventricular extension and poor neurological status EVD placed, now with slow to no output.  3% started 3/18, continue Na this AM 144  Hx of Hypertension Hypertensive Emergency Now Hypotensive Home meds:  valsartan, hydralazine, aldactone Unstable Now requiring pressor support, wean as tolerated.  Cleviprex gtt if becomes hypertensive Blood Pressure Goal: SBP between 130-150 for 24 hours and then less than 160 . Pressor support goal MAP > 60  Hyperlipidemia Home meds:  zetia, not resumed in hospital due to ICH LDL 46, goal < 70 High intensity statin not indicated due to ICH/LDL within goal   Diabetes type II, pre-diabetic Home meds:  none HgbA1c 6.5, goal < 7.0 Continue CBGs Continue SSI  Tobacco Abuse Former smoker  Dysphagia Patient has post-stroke dysphagia, SLP consulted    Diet   Diet NPO time specified   Advance diet as tolerated  Other Stroke Risk Factors Obesity, Body mass index is 31.16 kg/m., BMI >/= 30 associated with increased stroke risk, recommend weight loss, diet and exercise as appropriate  Obstructive sleep apnea, not on CPAP at home  Hospital day # 1   Pt seen by Neuro NP/APP and later by MD. Note/plan to be edited by MD as needed.    Lynnae January, DNP, AGACNP-BC [facility] Please use AMION for contact information & EPIC for messaging.  ATTENDING NOTE: I reviewed above note and agree with the assessment and plan. Pt was seen and examined.   2 family members at bedside.  Patient still intubated, unresponsive.  Large left parietal ICH with significant midline shift and hydrocephalus, status post EVD which now clogged.  Poor prognosis, discussed with family, likely comfort care measures later today.   Currently continue pressors and 3% for now until family make final decision.  For detailed assessment and plan, please refer to above/below as I have made changes wherever appropriate.   Marvel Plan, MD PhD Stroke Neurology 03/14/2024 5:16 PM  This patient is critically ill due to large ICH with brain herniation, hydrocephalus and at significant risk of neurological worsening, death form brain death. This patient's care requires constant monitoring of vital signs, hemodynamics, respiratory and cardiac monitoring, review of multiple databases, neurological assessment, discussion with family, other specialists and medical decision making of high complexity. I spent 35 minutes of neurocritical care time in the care of this patient.    To contact Stroke Continuity provider, please refer to [email or web address]. After hours, contact General Neurology

## 2024-03-18 DEATH — deceased

## 2024-05-23 ENCOUNTER — Encounter: Payer: 59 | Admitting: Internal Medicine
